# Patient Record
Sex: Male | Born: 1945 | Race: White | Hispanic: No | Marital: Single | Smoking: Former smoker
Health system: Southern US, Community
[De-identification: ages and names within clinical notes are randomized; demographics above are authoritative.]

## PROBLEM LIST (undated history)

## (undated) DIAGNOSIS — E785 Hyperlipidemia, unspecified: Secondary | ICD-10-CM

## (undated) DIAGNOSIS — R0902 Hypoxemia: Secondary | ICD-10-CM

## (undated) DIAGNOSIS — N189 Chronic kidney disease, unspecified: Secondary | ICD-10-CM

## (undated) DIAGNOSIS — I472 Ventricular tachycardia, unspecified: Secondary | ICD-10-CM

## (undated) DIAGNOSIS — I10 Essential (primary) hypertension: Secondary | ICD-10-CM

## (undated) DIAGNOSIS — K219 Gastro-esophageal reflux disease without esophagitis: Secondary | ICD-10-CM

## (undated) DIAGNOSIS — D649 Anemia, unspecified: Secondary | ICD-10-CM

## (undated) DIAGNOSIS — N289 Disorder of kidney and ureter, unspecified: Secondary | ICD-10-CM

## (undated) DIAGNOSIS — I509 Heart failure, unspecified: Secondary | ICD-10-CM

## (undated) DIAGNOSIS — E669 Obesity, unspecified: Secondary | ICD-10-CM

## (undated) DIAGNOSIS — I4821 Permanent atrial fibrillation: Secondary | ICD-10-CM

## (undated) DIAGNOSIS — F1020 Alcohol dependence, uncomplicated: Secondary | ICD-10-CM

## (undated) DIAGNOSIS — T4145XA Adverse effect of unspecified anesthetic, initial encounter: Secondary | ICD-10-CM

## (undated) DIAGNOSIS — T8859XA Other complications of anesthesia, initial encounter: Secondary | ICD-10-CM

## (undated) DIAGNOSIS — M109 Gout, unspecified: Secondary | ICD-10-CM

## (undated) HISTORY — PX: TONSILLECTOMY: SUR1361

## (undated) HISTORY — DX: Hypoxemia: R09.02

## (undated) HISTORY — DX: Obesity, unspecified: E66.9

## (undated) HISTORY — DX: Anemia, unspecified: D64.9

## (undated) HISTORY — DX: Ventricular tachycardia, unspecified: I47.20

## (undated) HISTORY — DX: Heart failure, unspecified: I50.9

## (undated) HISTORY — DX: Essential (primary) hypertension: I10

## (undated) HISTORY — DX: Ventricular tachycardia: I47.2

## (undated) HISTORY — DX: Alcohol dependence, uncomplicated: F10.20

---

## 2002-10-03 ENCOUNTER — Inpatient Hospital Stay (HOSPITAL_COMMUNITY): Admission: EM | Admit: 2002-10-03 | Discharge: 2002-10-09 | Payer: Self-pay | Admitting: Emergency Medicine

## 2002-10-03 ENCOUNTER — Encounter: Payer: Self-pay | Admitting: Emergency Medicine

## 2002-10-04 ENCOUNTER — Encounter (INDEPENDENT_AMBULATORY_CARE_PROVIDER_SITE_OTHER): Payer: Self-pay | Admitting: Interventional Cardiology

## 2005-01-16 ENCOUNTER — Ambulatory Visit: Payer: Self-pay | Admitting: Internal Medicine

## 2005-01-28 ENCOUNTER — Ambulatory Visit: Payer: Self-pay | Admitting: Internal Medicine

## 2005-02-03 ENCOUNTER — Ambulatory Visit: Payer: Self-pay | Admitting: Family Medicine

## 2005-02-04 ENCOUNTER — Ambulatory Visit: Payer: Self-pay | Admitting: *Deleted

## 2005-02-10 ENCOUNTER — Ambulatory Visit: Payer: Self-pay | Admitting: Family Medicine

## 2005-02-17 ENCOUNTER — Ambulatory Visit: Payer: Self-pay | Admitting: Family Medicine

## 2007-02-07 ENCOUNTER — Emergency Department (HOSPITAL_COMMUNITY): Admission: EM | Admit: 2007-02-07 | Discharge: 2007-02-07 | Payer: Self-pay | Admitting: Emergency Medicine

## 2007-08-23 ENCOUNTER — Ambulatory Visit: Payer: Self-pay | Admitting: Cardiology

## 2007-09-13 ENCOUNTER — Ambulatory Visit: Payer: Self-pay

## 2007-09-13 ENCOUNTER — Encounter: Payer: Self-pay | Admitting: Cardiology

## 2007-09-19 ENCOUNTER — Ambulatory Visit: Payer: Self-pay

## 2007-10-18 ENCOUNTER — Ambulatory Visit: Payer: Self-pay | Admitting: Cardiology

## 2007-10-21 ENCOUNTER — Ambulatory Visit: Payer: Self-pay | Admitting: Cardiology

## 2009-10-30 ENCOUNTER — Encounter (INDEPENDENT_AMBULATORY_CARE_PROVIDER_SITE_OTHER): Payer: Self-pay | Admitting: *Deleted

## 2009-10-30 ENCOUNTER — Encounter: Payer: Self-pay | Admitting: Cardiology

## 2009-12-06 DIAGNOSIS — I10 Essential (primary) hypertension: Secondary | ICD-10-CM | POA: Insufficient documentation

## 2009-12-06 DIAGNOSIS — I5032 Chronic diastolic (congestive) heart failure: Secondary | ICD-10-CM

## 2009-12-06 DIAGNOSIS — I4891 Unspecified atrial fibrillation: Secondary | ICD-10-CM

## 2009-12-09 ENCOUNTER — Ambulatory Visit: Payer: Self-pay | Admitting: Cardiology

## 2009-12-09 DIAGNOSIS — E669 Obesity, unspecified: Secondary | ICD-10-CM

## 2010-05-13 ENCOUNTER — Ambulatory Visit: Payer: Self-pay | Admitting: Cardiovascular Disease

## 2010-05-13 ENCOUNTER — Inpatient Hospital Stay (HOSPITAL_COMMUNITY): Admission: EM | Admit: 2010-05-13 | Discharge: 2010-05-30 | Payer: Self-pay | Admitting: Emergency Medicine

## 2010-05-14 ENCOUNTER — Encounter (INDEPENDENT_AMBULATORY_CARE_PROVIDER_SITE_OTHER): Payer: Self-pay | Admitting: Emergency Medicine

## 2010-05-30 ENCOUNTER — Encounter: Payer: Self-pay | Admitting: Cardiology

## 2010-06-02 ENCOUNTER — Telehealth: Payer: Self-pay | Admitting: Cardiology

## 2010-06-03 ENCOUNTER — Encounter: Payer: Self-pay | Admitting: Cardiology

## 2010-06-03 ENCOUNTER — Telehealth: Payer: Self-pay | Admitting: Cardiology

## 2010-06-04 ENCOUNTER — Encounter: Payer: Self-pay | Admitting: Cardiology

## 2010-06-09 ENCOUNTER — Encounter: Payer: Self-pay | Admitting: Cardiology

## 2010-06-10 ENCOUNTER — Encounter: Payer: Self-pay | Admitting: Cardiology

## 2010-06-11 ENCOUNTER — Ambulatory Visit: Payer: Self-pay | Admitting: Cardiology

## 2010-06-13 ENCOUNTER — Telehealth: Payer: Self-pay | Admitting: Cardiology

## 2010-06-18 ENCOUNTER — Telehealth (INDEPENDENT_AMBULATORY_CARE_PROVIDER_SITE_OTHER): Payer: Self-pay | Admitting: Physician Assistant

## 2010-06-23 ENCOUNTER — Telehealth: Payer: Self-pay | Admitting: Cardiology

## 2010-06-27 ENCOUNTER — Encounter: Payer: Self-pay | Admitting: Cardiology

## 2010-07-20 ENCOUNTER — Encounter: Payer: Self-pay | Admitting: Cardiology

## 2010-09-18 ENCOUNTER — Emergency Department (HOSPITAL_COMMUNITY): Admission: EM | Admit: 2010-09-18 | Discharge: 2010-01-20 | Payer: Self-pay | Admitting: Psychology

## 2010-10-30 ENCOUNTER — Encounter: Payer: Self-pay | Admitting: Cardiology

## 2010-10-31 ENCOUNTER — Encounter (INDEPENDENT_AMBULATORY_CARE_PROVIDER_SITE_OTHER): Payer: Self-pay | Admitting: *Deleted

## 2010-11-11 NOTE — Miscellaneous (Signed)
Summary: [facility] Orders   [facility] Orders   Imported By: Roderic Ovens 07/10/2010 12:02:01  _____________________________________________________________________  External Attachment:    Type:   Image     Comment:   External Document

## 2010-11-11 NOTE — Progress Notes (Signed)
Summary: questions re med     Phone Note Call from Patient   Caller: Patient Reason for Call: Talk to Nurse Summary of Call: pt's med list from hospital not matching up with meds he was given -pls call [phone] Initial call taken by: Glynda Jaeger,  June 03, 2010 1:51 PM  Follow-up for Phone Call        left message to call back  Sander Nephew, RN  Additional Follow-up for Phone Call Additional follow up Details #1::        pt returning call-can be reached at [phone] [facility]  June 04, 2010 8:14 AM     Additional Follow-up for Phone Call Additional follow up Details #2::    REVIEWED DISCHARGE MEDS WITH PT AS HE WASNT SURE HE UNDERSTOOD INSTRUCTION BASED ON WHAT HE WAS GIVEN AT THE TIME OF DISCHARGE AND THE MEDICATIONS THAT WERE FILLED AT THE PHARMACY.  AFTER REVIEWING THE  MEDS - PT STATED UNDERSTANDING AND THANKED ME FOR MY TIME. Follow-up by: Charolotte Capuchin, RN,  June 04, 2010 1:18 PM

## 2010-11-11 NOTE — Assessment & Plan Note (Signed)
Summary: rov/hypertention/a-fib/lg  Medications Added LISINOPRIL 40 MG TABS (LISINOPRIL) 1 by mouth two times a day AMLODIPINE BESYLATE 5 MG TABS (AMLODIPINE BESYLATE) 1 by mouth daily CLONIDINE HCL 0.2 MG TABS (CLONIDINE HCL) 1 by mouth two times a day COREG CR 80 MG XR24H-CAP (CARVEDILOL PHOSPHATE) 1 by mouth daily      Allergies Added: NKDA  Visit Type:  Follow-up Primary Provider:  [name], MD  CC:  CHF and Afib.  History of Present Illness: The patient presents for followup of his atrial fibrillation. He had a previous cardiomyopathy which was felt to be nonischemic. A stress test and ultrasound in 2008 and pulse are intact without benign. He has done well since that time. He has been on stable medical regimen. He has Coumadin followed by his primary physician. He has no problems with this. He does not feel palpitations. He has no shortness of breath and has had no PND or orthopnea. He denies any chest pressure, neck or arm discomfort.  Current Medications (verified): 1)  Lisinopril 40 Mg Tabs (Lisinopril) .Marland Kitchen.. 1 By Mouth Two Times A Day 2)  Klor-Con 20 Meq Pack (Potassium Chloride) .Marland Kitchen.. 1 By Mouth Once Daily 3)  Furosemide 40 Mg Tabs (Furosemide) .Marland Kitchen.. 1 By Mouth Once Daily 4)  Coumadin 5 Mg Tabs (Warfarin Sodium) .... As Directed 5)  Amlodipine Besylate 5 Mg Tabs (Amlodipine Besylate) .Marland Kitchen.. 1 By Mouth Daily 6)  Clonidine Hcl 0.2 Mg Tabs (Clonidine Hcl) .Marland Kitchen.. 1 By Mouth Two Times A Day 7)  Coreg Cr 80 Mg Xr24h-Cap (Carvedilol Phosphate) .Marland Kitchen.. 1 By Mouth Daily  Allergies (verified): No Known Drug Allergies  Past History:  Past Medical History: Congestive Heart Failure (EF was 35 - 40% in the past,  in 2008 EF 55%) Alcoholism Atrial Fibrillation Hypertension Obesity  Past Surgical History: Tonsilectomy  Review of Systems       As stated in the HPI and negative for all other systems.   Vital Signs:  Patient profile:   65 year old male Height:      72  inches Weight:      373 pounds BMI:     50.77 Pulse rate:   75 / minute Resp:     16 per minute BP sitting:   118 / 86  (right arm)  Vitals Entered By: Marrion Coy, CNA (December 09, 2009 9:49 AM)  Physical Exam  General:  Well developed, well nourished, in no acute distress. Head:  normocephalic and atraumatic Eyes:  PERRLA/EOM intact; conjunctiva and lids normal. Neck:  Neck supple, no JVD. No masses, thyromegaly or abnormal cervical nodes. Lungs:  Clear bilaterally to auscultation and percussion. Heart:  Non-displaced PMI, chest non-tender; irregular rate and rhythm, S1, S2 without murmurs, rubs or gallops. Carotid upstroke normal, no bruit. Normal abdominal aortic size, no bruits. Femorals normal pulses, no bruits. Pedals normal pulses. Mild bilateral lower extremity edema, no varicosities. Abdomen:  Morbidly obese, no rebound or guarding, unable to appreciate midline pulsatile mass, bruits or organomegaly Msk:  Back normal, normal gait. Muscle strength and tone normal. Pulses:  pulses normal in all 4 extremities Extremities:  No clubbing or cyanosis. Neurologic:  Alert and oriented x 3. Psych:  Normal affect.   EKG  Procedure date:  12/09/2009  Findings:      atrial fibrillation, rate 75, low voltage, left axis, poor anterior R-wave progression.  no change from previous  Impression & Recommendations:  Problem # 1:  ATRIAL FIBRILLATION (ICD-427.31) He has had good rate  control and tolerates Coumadin. No further therapy is indicated. Orders: EKG w/ Interpretation (93000)  Problem # 2:  OBESITY, UNSPECIFIED (ICD-278.00) This is his most significant health problem. I suggest that the [employer].  Problem # 3:  HYPERTENSION (ICD-401.9) His blood pressure is under good control. He was switched to carvedilol immediate release although he prefers the sustained release to improve adherence and also because it is working so well.  Problem # 4:  CHF (ICD-428.0) I have  no reason to suspect worsening heart failure poor ejection fraction. No further testing is indicated.  Patient Instructions: 1)  Your physician recommends that you schedule a follow-up appointment in: 24 months with Dr Antoine Poche 2)  Your physician recommends that you continue on your current medications as directed. Please refer to the Current Medication list given to you today. 3)  You have been diagnosed with atrial fibrillation.  Atrial fibrillation is a condition in which one of the upper chambers of the heart has extra electrical cells causing it to beat very fast.  Please see the handout/brochure given to you today for further information.

## 2010-11-11 NOTE — Progress Notes (Signed)
   Phone Note Other Incoming   Caller: Laurel Dimmer, RN Summary of Call: Pt had a discrepancy in meds at d/c 8-19. He was supposed to be on potassium daily but rec'd no Rx. [facility] [place], left a msg on the patient's home phone to advise him of the Rx. Initial call taken by: Park Breed PA-C,  June 18, 2010 12:29 PM

## 2010-11-11 NOTE — Miscellaneous (Signed)
Summary: Home Health Certification/Care Plan  Home Health Certification/Care Plan   Imported By: Roderic Ovens 07/10/2010 12:04:59  _____________________________________________________________________  External Attachment:    Type:   Image     Comment:   External Document

## 2010-11-11 NOTE — Letter (Signed)
Summary: New Patient letter  [facility]  [address] [place], [place] [address]   Phone: [phone]  Fax: [phone]       10/30/2009 MRN: [id number]  [name] [address] [place], [place]  [address]  Dear Mr. [name],  Welcome to the Gastroenterology Division at [facility].    You are scheduled to see Dr.  Christella Hartigan on 11/26/2009 at 3:30PM on the 3rd floor at [facility], [address].  We ask that you try to arrive at our office 15 minutes prior to your appointment time to allow for check-in.  We would like you to complete the enclosed self-administered evaluation form prior to your visit and bring it with you on the day of your appointment.  We will review it with you.  Also, please bring a complete list of all your medications or, if you prefer, bring the medication bottles and we will list them.  Please bring your insurance card so that we may make a copy of it.  If your insurance requires a referral to see a specialist, please bring your referral form from your primary care physician.  Co-payments are due at the time of your visit and may be paid by cash, check or credit card.     Your office visit will consist of a consult with your physician (includes a physical exam), any laboratory testing he/she may order, scheduling of any necessary diagnostic testing (e.g. x-ray, ultrasound, CT-scan), and scheduling of a procedure (e.g. Endoscopy, Colonoscopy) if required.  Please allow enough time on your schedule to allow for any/all of these possibilities.    If you cannot keep your appointment, please call [phone] to cancel or reschedule prior to your appointment date.  This allows [place] the opportunity to schedule an appointment for another patient in need of care.  If you do not cancel or reschedule by 5 p.m. the business day prior to your appointment date, you will be charged a $50.00 late cancellation/no-show fee.    Thank you for choosing [facility] for your medical needs.  We appreciate the opportunity to care for you.  Please visit [place] at our website  to learn more about our practice.                     Sincerely,                                                             The Gastroenterology Division

## 2010-11-11 NOTE — Assessment & Plan Note (Signed)
Summary: eph  Medications Added LISINOPRIL 5 MG TABS (LISINOPRIL) 1 by mouth daily FUROSEMIDE 40 MG TABS (FUROSEMIDE) 2 by mouth daily CARVEDILOL 6.25 MG TABS (CARVEDILOL) one twice a day KLOR-CON 10 10 MEQ CR-TABS (POTASSIUM CHLORIDE) 1 by mouth daily      Allergies Added: NKDA   Visit Type:  Follow-up Primary Provider:  [name], MD  CC:  Heart Failure.  History of Present Illness: The patient presents for evaluation after recent hospitalization for management of anasarca. He had combined systolic and diastolic heart failure. He required intensive diuresis and physical therapy as he was quite debilitated. Since going home he has had his blood work checked and his BUN and creatinine as well as potassium have been fine. He is tolerating the current regimen of diuresis was continued gradual weight loss. He is not having any new shortness of breath, PND or orthopnea. His massive scrotal and penile edema has resolved. He is working with physical therapy. His oxygen requirements have been creeping down.  Current Medications (verified): 1)  Lisinopril 5 Mg Tabs (Lisinopril) .Marland Kitchen.. 1 By Mouth Daily 2)  Furosemide 40 Mg Tabs (Furosemide) .... 2 By Mouth Daily 3)  Coumadin 5 Mg Tabs (Warfarin Sodium) .... As Directed 4)  Carvedilol 3.125 Mg Tabs (Carvedilol) .Marland Kitchen.. 1 By Mouth Two Times A Day 5)  Klor-Con 10 10 Meq Cr-Tabs (Potassium Chloride) .Marland Kitchen.. 1 By Mouth Daily  Allergies (verified): No Known Drug Allergies  Past History:  Past Medical History:  Nonischemic cardiomyopathy/congestive heart failure, ejection       fraction of 45% per echo in 2011 (previous EF 35-40% in 2003).  Alcoholism Atrial Fibrillation Hypertension Obesity Nonsustained, ventricular tachycardia, asymptomatic Anemia  Hypoxemia CHF  Past Surgical History: Reviewed history from 12/09/2009 and no changes required. Tonsilectomy  Review of Systems       As stated in the HPI and negative for all other  systems.   Vital Signs:  Patient profile:   65 year old male Height:      72 inches Weight:      318 pounds BMI:     43.28 Pulse rate:   82 / minute Resp:     18 per minute BP sitting:   122 / 74  (right arm)  Vitals Entered By: Marrion Coy, CNA (June 11, 2010 11:35 AM)  Physical Exam  General:  Well developed, well nourished, in no acute distress. Head:  normocephalic and atraumatic Neck:  No jugular venous distention at 90, carotids are brisk and symmetric, no bruits, thyromegaly Abdomen:  comment positive sounds normal frequency in pitch, no rebound or guarding, morbidly obese, unable to appreciate other findings as the patient is seated. Pulses:  2+ upper pulses, unable to appreciate dorsalis pedis and posterior tibialis Extremities:  calm moderate bilateral lower extremity edema slightly increased from discharge, severe chronic venous stasis changes Neurologic:  Alert and oriented x 3. Cervical Nodes:  no significant adenopathy Psych:  Normal affect.   EKG  Procedure date:  06/11/2010  Findings:      Atrial fibrillation, rate 82, left axis deviation, possible anteroseptal infarct, QT borderline prolongation, nonspecific T-wave flattening  Impression & Recommendations:  Problem # 1:  CHF (ICD-428.0) We discussed the need to continue to restrict salt and keep his feet elevated. He will continue on his current diuretics.  I will try to titrate his carvedilol to 6.25 mg b.i.d.  Problem # 2:  OBESITY, UNSPECIFIED (ICD-278.00) He understands the need to lose weight with diet and  exercise.  Problem # 3:  ATRIAL FIBRILLATION (ICD-427.31) He tolerates Coumadin and we'll continue with this watch by his primary physician.  Patient Instructions: 1)  Your physician recommends that you schedule a follow-up appointment in: 6  months 2)  Your physician has recommended you make the following change in your medication: Increase Carvedilol to 6.125 mg one twice a  day Prescriptions: CARVEDILOL 6.25 MG TABS (CARVEDILOL) one twice a day  #60 x 11   Entered by:   Charolotte Capuchin, RN   Authorized by:   [name], MD, [facility]   Signed by:   Charolotte Capuchin, RN on 06/11/2010   Method used:   [facility]  [place] [address]       [address] [place] [address]       [place], [place]  [address]       Ph: [phone] or [phone]       Fax: [phone]   RxID:   [id number]  I have reviewed and approved all prescriptions at the time of this visit. [name], MD, [facility]  June 11, 2010 1:38 PM

## 2010-11-11 NOTE — Miscellaneous (Signed)
  Clinical Lists Changes  Observations: Added new observation of CXR RESULTS: There is cardiomegaly with vascular congestion.  Possible   mild interstitial edema.  No confluent opacities or effusions.   Study somewhat limited by motion.    IMPRESSION:   Cardiomegaly with vascular congestion, question mild interstitial   edema.  Study limited by patient motion. (05/16/2010 9:24) Added new observation of ECHOINTERP:  - Left ventricle: Inferior and septal hypokinesis The cavity size     was mildly dilated. Wall thickness was increased in a pattern of     mild LVH. Systolic function was mildly reduced. The estimated     ejection fraction was in the range of 45% to 50%.   - Mitral valve: Mild regurgitation.   - Left atrium: The atrium was mildly dilated.   - Right ventricle: The cavity size was mildly dilated.   - Right atrium: The atrium was mildly dilated.   - Atrial septum: No defect or patent foramen ovale was identified.   - Pulmonary arteries: PA peak pressure: 50mm Hg (S). (05/14/2010 9:23)      Echocardiogram  Procedure date:  05/14/2010  Findings:       - Left ventricle: Inferior and septal hypokinesis The cavity size     was mildly dilated. Wall thickness was increased in a pattern of     mild LVH. Systolic function was mildly reduced. The estimated     ejection fraction was in the range of 45% to 50%.   - Mitral valve: Mild regurgitation.   - Left atrium: The atrium was mildly dilated.   - Right ventricle: The cavity size was mildly dilated.   - Right atrium: The atrium was mildly dilated.   - Atrial septum: No defect or patent foramen ovale was identified.   - Pulmonary arteries: PA peak pressure: 50mm Hg (S).  CXR  Procedure date:  05/16/2010  Findings:      There is cardiomegaly with vascular congestion.  Possible   mild interstitial edema.  No confluent opacities or effusions.   Study somewhat limited by motion.    IMPRESSION:   Cardiomegaly with vascular  congestion, question mild interstitial   edema.  Study limited by patient motion.

## 2010-11-11 NOTE — Letter (Signed)
Summary: [facility] Office Note  [facility] Note   Imported By: Roderic Ovens 12/20/2009 16:29:47  _____________________________________________________________________  External Attachment:    Type:   Image     Comment:   External Document

## 2010-11-11 NOTE — Letter (Signed)
Summary: [facility]  [facility]   Imported By: Marylou Mccoy 07/17/2010 13:39:11  _____________________________________________________________________  External Attachment:    Type:   Image     Comment:   External Document

## 2010-11-11 NOTE — Miscellaneous (Signed)
Summary: [facility] Order Confirmation Form   [facility] Order Confirmation Form   Imported By: Roderic Ovens 06/12/2010 14:29:37  _____________________________________________________________________  External Attachment:    Type:   Image     Comment:   External Document

## 2010-11-11 NOTE — Progress Notes (Signed)
Summary: pt has a medication question take carvedilol 3.125 BID   Phone Note Call from Patient Call back at [facility] Phone [phone]   Caller: Patient Reason for Call: Talk to Nurse, Talk to Doctor Summary of Call: pt was discharged friday and has a Rx for coreg and carvadilol is suppose to take both or not. If not which one dose he need to take. Initial call taken by: Omer Jack,  June 02, 2010 8:54 AM  Follow-up for Phone Call        pt should be on carvedilol 3.125 mg two times a day.  he is aware and states understanding Follow-up by: Charolotte Capuchin, RN,  June 02, 2010 9:47 AM

## 2010-11-11 NOTE — Letter (Signed)
Summary: [facility]  Order Confirmation   [facility]  Order Confirmation   Imported By: Roderic Ovens 07/16/2010 13:45:34  _____________________________________________________________________  External Attachment:    Type:   Image     Comment:   External Document

## 2010-11-11 NOTE — Progress Notes (Signed)
Summary: calling regarding pt oxygen     Phone Note Other Incoming Call back at [phone]   Caller: [facility] Summary of Call: Calling regarding pt oxygen Initial call taken by: Judie Grieve,  June 13, 2010 11:14 AM  Follow-up for Phone Call        LEFT MESSAGE TO CALL BACK  Sander Nephew, RN  on 3 L via n/c  pt wants to if you is to stay on it? and on 3 L?  V.O. ok for pt to wean 02 but keep 02 sat above 92%. per Dr Fayrene Fearing [facility] health nurse aware or orders Follow-up by: Charolotte Capuchin, RN,  June 13, 2010 11:34 AM

## 2010-11-11 NOTE — Miscellaneous (Signed)
Summary: [facility] Orders   [facility] Orders   Imported By: Roderic Ovens 07/23/2010 11:28:15  _____________________________________________________________________  External Attachment:    Type:   Image     Comment:   External Document

## 2010-11-11 NOTE — Progress Notes (Signed)
Summary: Home Health need orders for pt   Phone Note Other Incoming Call back at [phone]   Caller: Beaulah Corin  Summary of Call: [facility] orders for pt to stay on home health Initial call taken by: Judie Grieve,  June 23, 2010 2:08 PM  Follow-up for Phone Call        OT--they needed orders to continue home OT.  Have almost met goal but not there yet.  Gave V/ o to continue.  She will have order faxed to office Dennis Bast, RN, BSN  June 23, 2010 3:06 PM

## 2010-11-13 NOTE — Letter (Signed)
Summary: New Patient letter  [facility]  [address] [place], [place] [address]   Phone: [phone]  Fax: [phone]       10/31/2010 MRN: [id number]  [name] [address] [place], [place]  [address]  Dear [name],  Welcome to the Gastroenterology Division at [facility].    You are scheduled to see Dr.  Russella Dar on 12-08-10 at 3pm on the 3rd floor at [facility], [address].  We ask that you try to arrive at our office 15 minutes prior to your appointment time to allow for check-in.  We would like you to complete the enclosed self-administered evaluation form prior to your visit and bring it with you on the day of your appointment.  We will review it with you.  Also, please bring a complete list of all your medications or, if you prefer, bring the medication bottles and we will list them.  Please bring your insurance card so that we may make a copy of it.  If your insurance requires a referral to see a specialist, please bring your referral form from your primary care physician.  Co-payments are due at the time of your visit and may be paid by cash, check or credit card.     Your office visit will consist of a consult with your physician (includes a physical exam), any laboratory testing he/she may order, scheduling of any necessary diagnostic testing (e.g. x-ray, ultrasound, CT-scan), and scheduling of a procedure (e.g. Endoscopy, Colonoscopy) if required.  Please allow enough time on your schedule to allow for any/all of these possibilities.    If you cannot keep your appointment, please call [phone] to cancel or reschedule prior to your appointment date.  This allows [place] the opportunity to schedule an appointment for another patient in need of care.  If you do not cancel or reschedule by 5 p.m. the business day prior to your appointment date, you will be charged a $50.00 late cancellation/no-show fee.    Thank you for choosing [facility] for your medical needs.  We appreciate the opportunity to care for you.  Please visit [place] at our website  to learn more about our practice.                     Sincerely,                                                             The Gastroenterology Division

## 2010-11-18 ENCOUNTER — Encounter: Payer: Self-pay | Admitting: Cardiology

## 2010-11-18 ENCOUNTER — Telehealth: Payer: Self-pay | Admitting: Cardiology

## 2010-11-26 ENCOUNTER — Telehealth: Payer: Self-pay | Admitting: Cardiology

## 2010-11-27 NOTE — Progress Notes (Signed)
Summary: No longer needs oxygen need order faxed   Phone Note Call from Patient Call back at Home Phone [phone]   Caller: Patient Summary of Call: Pt need something faxed to [facility] that he no longer needs oxygen fax to [phone] Initial call taken by: Judie Grieve,  November 18, 2010 11:27 AM  Follow-up for Phone Call        LVMTCB*  Whitney Maeola Sarah RN  November 18, 2010 12:14 PM  Pt returning call Judie Grieve  November 18, 2010 1:02 PM  Follow-up by: Whitney Maeola Sarah RN,  November 18, 2010 12:14 PM  Additional Follow-up for Phone Call Additional follow up Details #1::        OK to DC 02 Additional Follow-up by: [name], MD, [facility],  November 18, 2010 5:53 PM    Additional Follow-up for Phone Call Additional follow up Details #2::    order faxed  to [facility] Follow-up by: Charolotte Capuchin, RN,  November 18, 2010 6:04 PM

## 2010-11-27 NOTE — Letter (Addendum)
Summary: order to D/C oxygen to [facility], Main Office  [address]   [place], [place] [address]   Phone: [phone]  Fax: [phone]        November 18, 2010    Re:     [name] Address:   [address]     [place], [place]  [address] DOB:     Jun 27, 1946 MRN:     [id number]     Dear [facility],  The above named patient no longer needs oxygen and it may be dis-continued.  If questions, please call [phone].           Sincerely,      Charolotte Capuchin, RN for Dr. [name]

## 2010-12-03 NOTE — Progress Notes (Signed)
Summary: O2 letter    order faxed to [facility] to d/c   Phone Note Call from Patient Call back at [facility] Phone [phone]   Caller: Patient Reason for Call: Talk to Nurse, Talk to Doctor Summary of Call: The company that provides the O2 never got the letter to discontinue it please refax Initial call taken by: Omer Jack,  November 26, 2010 1:25 PM  Follow-up for Phone Call        The company the pt has been using is [employer] not [facility] as Nadeen Landau stated.  The order to d/c will be faxed to them. Follow-up by: Charolotte Capuchin, RN,  November 26, 2010 1:38 PM

## 2010-12-08 ENCOUNTER — Ambulatory Visit: Payer: Self-pay | Admitting: Gastroenterology

## 2010-12-26 LAB — DIFFERENTIAL
Basophils Absolute: 0 10*3/uL (ref 0.0–0.1)
Eosinophils Absolute: 0.3 10*3/uL (ref 0.0–0.7)
Eosinophils Relative: 4 % (ref 0–5)
Lymphocytes Relative: 9 % — ABNORMAL LOW (ref 12–46)
Lymphs Abs: 0.6 10*3/uL — ABNORMAL LOW (ref 0.7–4.0)
Neutrophils Relative %: 76 % (ref 43–77)

## 2010-12-26 LAB — PROTIME-INR
INR: 1.85 — ABNORMAL HIGH (ref 0.00–1.49)
INR: 2.01 — ABNORMAL HIGH (ref 0.00–1.49)
INR: 2.06 — ABNORMAL HIGH (ref 0.00–1.49)
INR: 2.14 — ABNORMAL HIGH (ref 0.00–1.49)
INR: 2.43 — ABNORMAL HIGH (ref 0.00–1.49)
INR: 3.13 — ABNORMAL HIGH (ref 0.00–1.49)
INR: 3.15 — ABNORMAL HIGH (ref 0.00–1.49)
INR: 3.2 — ABNORMAL HIGH (ref 0.00–1.49)
INR: 3.33 — ABNORMAL HIGH (ref 0.00–1.49)
INR: 3.56 — ABNORMAL HIGH (ref 0.00–1.49)
INR: 3.57 — ABNORMAL HIGH (ref 0.00–1.49)
INR: 3.61 — ABNORMAL HIGH (ref 0.00–1.49)
Prothrombin Time: 23.4 s — ABNORMAL HIGH (ref 11.6–15.2)
Prothrombin Time: 24.1 s — ABNORMAL HIGH (ref 11.6–15.2)
Prothrombin Time: 26.5 s — ABNORMAL HIGH (ref 11.6–15.2)
Prothrombin Time: 27.5 seconds — ABNORMAL HIGH (ref 11.6–15.2)
Prothrombin Time: 28.1 seconds — ABNORMAL HIGH (ref 11.6–15.2)
Prothrombin Time: 32.2 s — ABNORMAL HIGH (ref 11.6–15.2)
Prothrombin Time: 32.4 s — ABNORMAL HIGH (ref 11.6–15.2)
Prothrombin Time: 32.8 seconds — ABNORMAL HIGH (ref 11.6–15.2)
Prothrombin Time: 33.8 seconds — ABNORMAL HIGH (ref 11.6–15.2)
Prothrombin Time: 35.3 seconds — ABNORMAL HIGH (ref 11.6–15.2)
Prothrombin Time: 35.7 s — ABNORMAL HIGH (ref 11.6–15.2)
Prothrombin Time: 36 s — ABNORMAL HIGH (ref 11.6–15.2)

## 2010-12-26 LAB — BASIC METABOLIC PANEL WITH GFR
BUN: 26 mg/dL — ABNORMAL HIGH (ref 6–23)
BUN: 33 mg/dL — ABNORMAL HIGH (ref 6–23)
BUN: 38 mg/dL — ABNORMAL HIGH (ref 6–23)
BUN: 46 mg/dL — ABNORMAL HIGH (ref 6–23)
CO2: 35 meq/L — ABNORMAL HIGH (ref 19–32)
CO2: 35 meq/L — ABNORMAL HIGH (ref 19–32)
CO2: 37 meq/L — ABNORMAL HIGH (ref 19–32)
CO2: 39 meq/L — ABNORMAL HIGH (ref 19–32)
Calcium: 8.6 mg/dL (ref 8.4–10.5)
Calcium: 8.7 mg/dL (ref 8.4–10.5)
Calcium: 8.7 mg/dL (ref 8.4–10.5)
Calcium: 8.8 mg/dL (ref 8.4–10.5)
Chloride: 100 meq/L (ref 96–112)
Chloride: 96 meq/L (ref 96–112)
Chloride: 97 meq/L (ref 96–112)
Chloride: 97 meq/L (ref 96–112)
Creatinine, Ser: 1 mg/dL (ref 0.4–1.5)
Creatinine, Ser: 1 mg/dL (ref 0.4–1.5)
Creatinine, Ser: 1.16 mg/dL (ref 0.4–1.5)
Creatinine, Ser: 1.62 mg/dL — ABNORMAL HIGH (ref 0.4–1.5)
GFR calc non Af Amer: 43 mL/min — ABNORMAL LOW
GFR calc non Af Amer: >60 mL/min
GFR calc non Af Amer: >60 mL/min
GFR calc non Af Amer: >60 mL/min
Glucose, Bld: 109 mg/dL — ABNORMAL HIGH (ref 70–99)
Glucose, Bld: 116 mg/dL — ABNORMAL HIGH (ref 70–99)
Glucose, Bld: 120 mg/dL — ABNORMAL HIGH (ref 70–99)
Glucose, Bld: 135 mg/dL — ABNORMAL HIGH (ref 70–99)
Potassium: 3.9 meq/L (ref 3.5–5.1)
Potassium: 4.4 meq/L (ref 3.5–5.1)
Potassium: 4.6 meq/L (ref 3.5–5.1)
Potassium: 4.7 meq/L (ref 3.5–5.1)
Sodium: 140 meq/L (ref 135–145)
Sodium: 141 meq/L (ref 135–145)
Sodium: 141 meq/L (ref 135–145)
Sodium: 144 meq/L (ref 135–145)

## 2010-12-26 LAB — BASIC METABOLIC PANEL
BUN: 14 mg/dL (ref 6–23)
BUN: 15 mg/dL (ref 6–23)
BUN: 19 mg/dL (ref 6–23)
BUN: 20 mg/dL (ref 6–23)
BUN: 23 mg/dL (ref 6–23)
BUN: 28 mg/dL — ABNORMAL HIGH (ref 6–23)
CO2: 32 mEq/L (ref 19–32)
CO2: 34 mEq/L — ABNORMAL HIGH (ref 19–32)
CO2: 36 mEq/L — ABNORMAL HIGH (ref 19–32)
CO2: 36 mEq/L — ABNORMAL HIGH (ref 19–32)
CO2: 38 mEq/L — ABNORMAL HIGH (ref 19–32)
Calcium: 8.4 mg/dL (ref 8.4–10.5)
Calcium: 8.6 mg/dL (ref 8.4–10.5)
Calcium: 8.7 mg/dL (ref 8.4–10.5)
Calcium: 9.1 mg/dL (ref 8.4–10.5)
Calcium: 9.1 mg/dL (ref 8.4–10.5)
Calcium: 9.1 mg/dL (ref 8.4–10.5)
Calcium: 9.4 mg/dL (ref 8.4–10.5)
Chloride: 102 mEq/L (ref 96–112)
Chloride: 102 mEq/L (ref 96–112)
Chloride: 104 mEq/L (ref 96–112)
Chloride: 105 mEq/L (ref 96–112)
Creatinine, Ser: 0.88 mg/dL (ref 0.4–1.5)
Creatinine, Ser: 0.88 mg/dL (ref 0.4–1.5)
Creatinine, Ser: 0.93 mg/dL (ref 0.4–1.5)
Creatinine, Ser: 0.95 mg/dL (ref 0.4–1.5)
Creatinine, Ser: 1.05 mg/dL (ref 0.4–1.5)
GFR calc Af Amer: >60 mL/min (ref >60)
GFR calc Af Amer: >60 mL/min (ref >60)
GFR calc Af Amer: >60 mL/min (ref >60)
GFR calc Af Amer: >60 mL/min (ref >60)
GFR calc Af Amer: >60 mL/min (ref >60)
GFR calc Af Amer: >60 mL/min (ref >60)
GFR calc non Af Amer: 46 mL/min — ABNORMAL LOW (ref >60)
GFR calc non Af Amer: >60 mL/min (ref >60)
GFR calc non Af Amer: >60 mL/min (ref >60)
GFR calc non Af Amer: >60 mL/min (ref >60)
GFR calc non Af Amer: >60 mL/min (ref >60)
GFR calc non Af Amer: >60 mL/min (ref >60)
GFR calc non Af Amer: >60 mL/min (ref >60)
Glucose, Bld: 102 mg/dL — ABNORMAL HIGH (ref 70–99)
Glucose, Bld: 103 mg/dL — ABNORMAL HIGH (ref 70–99)
Glucose, Bld: 109 mg/dL — ABNORMAL HIGH (ref 70–99)
Glucose, Bld: 118 mg/dL — ABNORMAL HIGH (ref 70–99)
Glucose, Bld: 122 mg/dL — ABNORMAL HIGH (ref 70–99)
Glucose, Bld: 122 mg/dL — ABNORMAL HIGH (ref 70–99)
Glucose, Bld: 98 mg/dL (ref 70–99)
Potassium: 3.8 mEq/L (ref 3.5–5.1)
Potassium: 3.9 mEq/L (ref 3.5–5.1)
Potassium: 4 mEq/L (ref 3.5–5.1)
Potassium: 4.3 mEq/L (ref 3.5–5.1)
Potassium: 4.3 mEq/L (ref 3.5–5.1)
Potassium: 4.9 mEq/L (ref 3.5–5.1)
Potassium: 5 mEq/L (ref 3.5–5.1)
Sodium: 138 mEq/L (ref 135–145)
Sodium: 140 mEq/L (ref 135–145)
Sodium: 141 mEq/L (ref 135–145)
Sodium: 141 mEq/L (ref 135–145)
Sodium: 142 mEq/L (ref 135–145)
Sodium: 142 mEq/L (ref 135–145)
Sodium: 142 mEq/L (ref 135–145)
Sodium: 143 mEq/L (ref 135–145)
Sodium: 143 mEq/L (ref 135–145)

## 2010-12-26 LAB — COMPREHENSIVE METABOLIC PANEL
BUN: 29 mg/dL — ABNORMAL HIGH (ref 6–23)
CO2: 25 mEq/L (ref 19–32)
Calcium: 8.7 mg/dL (ref 8.4–10.5)
Chloride: 108 mEq/L (ref 96–112)
Creatinine, Ser: 1.29 mg/dL (ref 0.4–1.5)
GFR calc non Af Amer: 56 mL/min — ABNORMAL LOW (ref >60)
Glucose, Bld: 130 mg/dL — ABNORMAL HIGH (ref 70–99)
Total Bilirubin: 1.3 mg/dL — ABNORMAL HIGH (ref 0.3–1.2)

## 2010-12-26 LAB — CBC
HCT: 31.7 % — ABNORMAL LOW (ref 39.0–52.0)
HCT: 33.3 % — ABNORMAL LOW (ref 39.0–52.0)
HCT: 33.5 % — ABNORMAL LOW (ref 39.0–52.0)
HCT: 33.6 % — ABNORMAL LOW (ref 39.0–52.0)
Hemoglobin: 10 g/dL — ABNORMAL LOW (ref 13.0–17.0)
Hemoglobin: 10.3 g/dL — ABNORMAL LOW (ref 13.0–17.0)
Hemoglobin: 10.6 g/dL — ABNORMAL LOW (ref 13.0–17.0)
Hemoglobin: 9.8 g/dL — ABNORMAL LOW (ref 13.0–17.0)
MCH: 28.8 pg (ref 26.0–34.0)
MCH: 29.3 pg (ref 26.0–34.0)
MCH: 29.4 pg (ref 26.0–34.0)
MCHC: 30 g/dL (ref 30.0–36.0)
MCHC: 30.7 g/dL (ref 30.0–36.0)
MCHC: 30.9 g/dL (ref 30.0–36.0)
MCHC: 31.5 g/dL (ref 30.0–36.0)
MCV: 93.1 fL (ref 78.0–100.0)
MCV: 94.6 fL (ref 78.0–100.0)
MCV: 95.7 fL (ref 78.0–100.0)
MCV: 96 fL (ref 78.0–100.0)
MCV: 96.1 fL (ref 78.0–100.0)
Platelets: 220 10*3/uL (ref 150–400)
Platelets: 232 10*3/uL (ref 150–400)
Platelets: 301 10*3/uL (ref 150–400)
RBC: 3.35 MIL/uL — ABNORMAL LOW (ref 4.22–5.81)
RBC: 3.47 MIL/uL — ABNORMAL LOW (ref 4.22–5.81)
RBC: 3.61 MIL/uL — ABNORMAL LOW (ref 4.22–5.81)
RDW: 17.4 % — ABNORMAL HIGH (ref 11.5–15.5)
RDW: 17.7 % — ABNORMAL HIGH (ref 11.5–15.5)
RDW: 17.8 % — ABNORMAL HIGH (ref 11.5–15.5)
WBC: 5.8 10*3/uL (ref 4.0–10.5)
WBC: 6.4 10*3/uL (ref 4.0–10.5)
WBC: 7.4 10*3/uL (ref 4.0–10.5)
WBC: 7.9 10*3/uL (ref 4.0–10.5)

## 2010-12-26 LAB — GLUCOSE, CAPILLARY
Comment 1: 0
Glucose-Capillary: 113 mg/dL — ABNORMAL HIGH (ref 70–99)
Glucose-Capillary: 136 mg/dL — ABNORMAL HIGH (ref 70–99)

## 2010-12-26 LAB — CK TOTAL AND CKMB (NOT AT ARMC)
CK, MB: 1.1 ng/mL (ref 0.3–4.0)
Relative Index: INVALID (ref 0.0–2.5)

## 2010-12-26 LAB — HEMOGLOBIN A1C
Hgb A1c MFr Bld: 6.4 % — ABNORMAL HIGH (ref <5.7)
Mean Plasma Glucose: 137 mg/dL — ABNORMAL HIGH (ref <117)

## 2010-12-26 LAB — CARDIAC PANEL(CRET KIN+CKTOT+MB+TROPI)
CK, MB: 1.2 ng/mL (ref 0.3–4.0)
Relative Index: INVALID (ref 0.0–2.5)
Total CK: 46 U/L (ref 7–232)
Total CK: 46 U/L (ref 7–232)
Troponin I: 0.02 ng/mL (ref 0.00–0.06)

## 2010-12-26 LAB — WOUND CULTURE

## 2010-12-26 LAB — BRAIN NATRIURETIC PEPTIDE
Pro B Natriuretic peptide (BNP): 367 pg/mL — ABNORMAL HIGH (ref 0.0–100.0)
Pro B Natriuretic peptide (BNP): 838 pg/mL — ABNORMAL HIGH (ref 0.0–100.0)
Pro B Natriuretic peptide (BNP): 952 pg/mL — ABNORMAL HIGH (ref 0.0–100.0)

## 2010-12-26 LAB — POCT CARDIAC MARKERS
CKMB, poc: <1 ng/mL — ABNORMAL LOW (ref 1.0–8.0)
Myoglobin, poc: 71.6 ng/mL (ref 12–200)

## 2011-02-12 ENCOUNTER — Ambulatory Visit: Payer: Self-pay | Admitting: Cardiology

## 2011-02-12 ENCOUNTER — Other Ambulatory Visit: Payer: Self-pay | Admitting: *Deleted

## 2011-02-12 MED ORDER — CARVEDILOL 6.25 MG PO TABS: 6.2500 mg | ORAL_TABLET | Freq: Two times a day (BID) | ORAL | Status: DC

## 2011-02-12 MED ORDER — POTASSIUM CHLORIDE 10 MEQ PO TBCR: 10.0000 meq | EXTENDED_RELEASE_TABLET | Freq: Every day | ORAL | Status: DC

## 2011-02-24 NOTE — Assessment & Plan Note (Signed)
[facility]                            CARDIOLOGY OFFICE NOTE   [name], Richard                        MRN:          [id number]  DATE:10/18/2007                            DOB:          1946/02/05    PRIMARY CARE PHYSICIAN:  Kristian Covey PA at the [facility].   REASON FOR PRESENTATION:  Evaluate patient with congestive heart  failure.   HISTORY OF PRESENT ILLNESS:  This is the second office visit for this  pleasant 65 year old gentleman.  He had a history of congestive heart  failure with a reduced ejection fraction in 2003 of 35-45%.  He had no  ischemia workup.  He came to see me and he was doing relatively well.  He did have an exercise perfusion study which demonstrated no evidence  of ischemia or infarct.  The EF was said to be 47%.  An echocardiogram  demonstrated the EF to be 50-55% with no significant valvular  abnormalities.   He returns today.  He says he has been feeling well.  He is not  describing any new shortness of breath.  He has no PND or orthopnea.  He  has not been having any chest discomfort, neck or arm discomfort.  He  has had no palpitations, presyncope or syncope.   Of note, he is in permanent atrial fibrillation.  He is on Coumadin for  this.   PAST MEDICAL HISTORY:  1. Cardiomyopathy (had been 35-45% in 2003, but appears to be 50-55%      currently).  2. Atrial fibrillation permanently.  3. Hypertension x 6 years.  4. Previous ETOH use.  5. Morbid obesity.   ALLERGIES:  [name].   MEDICATIONS:  1. Lisinopril 40 mg daily.  2. Klor-Con 20 mEq daily.  3. Furosemide 40 mg daily.  4. Coumadin.  5. Coreg 80 mg daily.   REVIEW OF SYSTEMS:  As stated in the HPI and otherwise negative for  other systems.   PHYSICAL EXAMINATION:  GENERAL:  The patient is in no distress.  VITAL SIGNS:  Blood pressure 136/92, heart rate 88 and regular, weight  366 pounds, body mass index 49.  HEENT:  Eyes are unremarkable.   Pupils are equal, round, and reactive to  light.  Fundi not visualized.  Oral mucosa unremarkable.  NECK:  No jugular venous distention at 45 degrees.  Carotid upstroke  brisk and symmetric.  No bruits.  No thyromegaly.  LYMPHATICS:  No cervical, axillary or inguinal adenopathy.  LUNGS:  Clear to auscultation bilaterally.  BACK:  No costovertebral angle tenderness.  CHEST:  Unremarkable.  HEART:  PMI not displaced or sustained.  S1 and S2 within normal limits.  No S3.  No S4, clicks, rubs, murmurs.  ABDOMEN:  Morbidly obese.  Positive bowel sounds.  Normal frequency and  pitch.  No bruits.  No rebound.  No guarding.  Midline pulses.  No mass,  hepatosplenomegaly, splenomegaly.  SKIN:  No rashes.  EXTREMITIES:  Pulses are 2+.  No edema.   ASSESSMENT/PLAN:  1. Cardiomyopathy.  The patient may have a reduced systolic ejection  fraction in the past, but it now is improved.  He has a low normal      systolic ejection fraction.  He probably does have some diastolic      dysfunction that is managed with diuretics, salt restriction and      blood pressure control.  I will make no change to his medical      regimen.  2. Obesity.  He understands the need to lose weight with diet and      exercise.  3. Hypertension.  His blood pressure is well controlled.  He will      continue the medications as listed.  4. Atrial fibrillation.  He is tolerating Coumadin.  I am going to put      a 24 hour Holter monitor to make sure that he has adequate rate      control.  Further medication changes will be based on this.   FOLLOW UP:  I will see him back in 1 year and then perhaps as needed  thereafter.     [name], MD, [facility]  Electronically Signed    JH/MedQ  DD: 10/18/2007  DT: 10/18/2007  Job #: [id number]   cc:   Kristian Covey, PA

## 2011-02-24 NOTE — Assessment & Plan Note (Signed)
[facility]                            CARDIOLOGY OFFICE NOTE   [name], [name]                        MRN:          [id number]  DATE:08/23/2007                            DOB:          [birth year]    REFERRED BY:  Kristian Covey, PA-C at the [facility].   REASON FOR PRESENTATION:  The patient has cardiomyopathy.   HISTORY OF PRESENT ILLNESS:  The patient is a 65 year old gentleman with  a history of cardiomyopathy diagnosed in 2004.  He was seen by another  cardiology group at that time but stopped seeing them due to a lack of  insurance.  I do see from his hospital notes in December 2003 actually  that he had dyspnea.  He was noted to have atrial flutter.  His  echocardiogram demonstrated right ventricular enlargement which they  felt was secondary to left ventricular failure.  The etiology was felt  to be possibly alcohol, possibly sleep apnea related to his obesity.  They did not rule out coronary obstruction.  There was not a stress test  or catheterization at that time.  The patient was managed medically.  He  was followed for a short time as an outpatient but has not seen a  cardiologist in some time.  He was followed very closely and carefully  by Dr. Collins Scotland.  He was referred today to establish with a cardiologist  given this diagnosis.  He actually reports that he gets along fairly  well.  He says he walks 100 yards to his mailbox routinely.  There is a  slight incline here.  He says he does not get any profound dyspnea with  this.  He will get short of breath if he does this a couple of times.  He certainly does not describe any resting shortness of breath.  Denies  any PND or orthopnea.  He denies any chest discomfort, neck or arm  discomfort.  He has had no palpitations, presyncope or syncope.  He says  he does not have excessive daytime somnolence.  He says he does not  snore.  The only person who would be there to hear him is his  elderly  father who might be asleep.   PAST MEDICAL HISTORY:  1. Cardiomyopathy (EF 35-45% in December 2003.  The study was not able      to visualize regional wall motion abnormalities.  There were no      significant valvular abnormalities other than mild to moderate      tricuspid regurgitation.  He was estimated to have some mild      elevated systolic pressures in his right ventricle).  2. Atrial fibrillation persistently with chronic Coumadin therapy.  3. Hypertension for six years.  4. Previous EtOH use.  5. Morbid obesity.   PAST SURGICAL HISTORY:  None.   MEDICATIONS:  1. Lisinopril 40 mg daily.  2. Klor-Con 20 mEq daily.  3. Furosemide 40 mg daily.  4. Coumadin 5 mg as directed.  5. Coreg 80 mg daily.   SOCIAL HISTORY:  The patient has never been married.  He has no  children.  He lives with his father.  He is currently disabled.  He quit  smoking in 1985 after two packs per day for 10 years.  He still drinks  beer.   FAMILY HISTORY:  Noncontributory for early coronary artery disease,  cardiomyopathy or congestive heart failure.   REVIEW OF SYSTEMS:  As stated in the HPI and positive for reflux, mild  lower extremity swelling, rhinitis, negative for other systems.   PHYSICAL EXAMINATION:  VITAL SIGNS:  The patient is in no distress.  Blood pressure 178/106.  Heart rate 93 and regular.  Weight 369 pounds,  body mass index 49.  HEENT:  Eyes unremarkable.  Pupils are equal, round and reactive to  light.  Fundi not visualized.  Oral mucosa unremarkable.  NECK:  No jugular venous distension at 45 degrees.  Carotid upstroke  brisk and symmetric.  No bruits.  No thyromegaly.  LYMPHATICS:  Compromised by his size but no obvious cervical, axillary  or inguinal adenopathy.  LUNGS:  Clear to auscultation bilaterally.  BACK:  No costovertebral angle tenderness.  CHEST:  Unremarkable.  HEART:  PMI not displaced or sustained.  S1 and S2 within normal limits.  No S3.  No S4,  clicks, rubs, murmurs.  ABDOMEN:  Morbidly obese.  Positive bowel sounds.  Normal in frequency  and pitch.  No bruits, rebound, guarding. No midline pulsatile mass.  No  hepatomegaly.  No splenomegaly.  SKIN:  No rash or nodules.  EXTREMITIES:  2+ pulses throughout.  Mild bilaterally lower extremity  edema.  NEUROLOGIC:  Oriented to person, place and time.  Cranial nerves II-XII  grossly intact.  Motor grossly intact.   EKG:  Atrial fibrillation, rate 91, right axis deviation, poor anterior  R wave progression.  Nonspecific T wave changes.  Cannot exclude old  anterior infarct.   ASSESSMENT/PLAN:  Problem #1.  Cardiomyopathy.  The patient has a  cardiomyopathy and has not had his ejection fraction apparently measured  in quite awhile.  I will try to get an echocardiogram though I suspect  he will have some poor images.  He needs to have ischemic cardiomyopathy  excluded. In this situation, given his Coumadin and his size, I am  actually going to proceed with his stress perfusion study to see if we  can get reasonable images to rule out obstructive coronary artery  disease.  He does not have any systems consistent with angina.  Therefore, I think this is a reasonable choice rather than  catheterization.  He seems to be on a reasonable medication regimen.  His blood pressure is elevated as addressed below.  We discussed the  likely diagnosis and will continue to review this with extensive  education.   Problem #2.  Hypertension.  Blood pressure is elevated here.  However,  he says that he is very anxious coming here.  He says it is always under  excellent control at Dr. Alda Berthold office.  I will try to call the office  in the next few days to see if this is correct.  We could titrate his  medications for heart failure and blood pressure control if this is not  the case.   Problem #3.  Morbid obesity.  We discussed the need to lose weight with  diet and exercise.   Problem #4.   Atrial fibrillation.  He seems to have reasonable rate  control and he is maintaining Coumadin which is indicated.   Problem #5.  Alcohol.  I am not sure how much he drinks.  It was never  clear that this might not be an etiology.  Abstinence is recommended.   FOLLOW UP:  I would like to see him in a couple of months since I am  just getting to know his situation.     [name], MD, [facility]  Electronically Signed    JH/MedQ  DD: 08/23/2007  DT: 08/24/2007  Job #: [id number]   cc:   Kristian Covey, PA-C  Tammy R. Collins Scotland, M.D.

## 2011-02-27 NOTE — Discharge Summary (Signed)
[name], GETER NO.:  [id number]   MEDICAL RECORD NO.:  [id number]                   PATIENT TYPE:  INP   LOCATION:  0355                                 FACILITY:  [facility]   PHYSICIAN:  Darius Bump, M.D.             DATE OF BIRTH:  [birth year]   DATE OF ADMISSION:  10/03/2002  DATE OF DISCHARGE:  10/09/2002                                 DISCHARGE SUMMARY   ADMISSION DIAGNOSES:  1. Volume overload/congestive heart failure of unclear etiology.  2. Anemia.  3. Possible obstructive sleep apnea.   DISCHARGE DIAGNOSES:  1. Congestive heart failure compensated.  2. New onset atrial fibrillation on anticoagulation.  3. 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   BRIEF HISTORY:  [name] is a 65 year old gentleman admitted on October 03, 2002 with a three week history of progressively worsening lower  extremity edema, shortness of breath, orthopnea, easy fatigability and PND  without chest pain or palpitations.   PAST MEDICAL HISTORY:  Remarkable for no history of heart disease, renal  disease, liver disease or thyroid illness. He was on no medications at the  time of admission.   PHYSICAL EXAMINATION:  VITAL SIGNS:  Blood pressure 176/96, heart rate 114,  respiratory rate 26, O2 sat 95% on 2 liters and afebrile.  GENERAL:  He is a morbidly obese gentleman in mild respiratory distress.  HEENT:  Unremarkable.  NECK:  Without JVD.  LUNGS:  Vesicular but reduced at the bases.  HEART:  Tachypneic without gallop or murmur heard.  ABDOMEN:  Obese without edema. Bowel sounds were present.  EXTREMITIES:  With 4+ pitting edema to the knees bilaterally.   ADMISSION LABS:  Remarkable for a white count of 6, hemoglobin of 12.3,  platelet count of 281.   Chest x-ray showed cardiomegaly with congestion.   HOSPITAL COURSE:  1. CONGESTIVE HEART FAILURE.  Etiology of his CHF was unclear. He ruled out     for an MI  by CPK's. Echocardiogram was technically difficult, did show a     mildly dilated LV with and EF between 35 and 45%, aortic valve thickness     was mildly increased. He appeared to have mild to moderately increased     pulmonary pressures. The right atrium was dilated. By hospital day three,     there appeared to be some atrial flutter and a four beat run of V-TACH     on telemetry. A cardiology consultation was obtained. The patient was     seen by Dr. Katrinka Blazing. The patient had beta blocker therapy and chronic     Coumadin were recommended along with low dose of amiodarone in     anticipation of cardioversion in 4-6 weeks. The patient diuresed     throughout hospitalization and was considerably more comfortable at the     time of discharge.  2. POSSIBILITY  OF OBSTRUCTIVE SLEEP APNEA.  It is felt that with elevated     pulmonary pressure that the patient likely had cor pulmonale due to OSA.     He did desaturate though on pulse oximetry in the hospital and a sleep     study had to be set up for him upon discharge.   DISCHARGE MEDICATIONS:  1. Coumadin 10 mg 1/2 tablet today on October 10, 2002 with a followup PT     on October 11, 2002.  2. Lasix 40 mg a day.  3. Iron sulfate 325 mg daily.  4. K-Dur 20 mEq daily.  5. Coreg 6.25 mg twice daily.  6. Altace 5 mg daily.  7. Amiodarone per cardiology.   CONSULTATIONS:  Dr. Verdis Prime of cardiology.   PROCEDURE:  Echocardiogram on October 04, 2002 with findings as above.   FOLLOW UP:  The patient is to follow up on __________ Cardiology on October 11, 2002 for a protime. He is to follow up with cardiology per the  instructions and he was recommended to see Dr. Leonides Sake, with whom he  had made an appointment to establish care prior to admission with followup  in the next two weeks.                                               Darius Bump, M.D.    MJM/MEDQ  D:  11/24/2002  T:  11/24/2002  Job:  [id number]   cc:   Lyn Records III, M.D.  [address]  [address]  [place]  [place] [address]  Fax: [phone]   Holley Bouche, M.D.  [address]  [place], [place] [address]  Fax: [phone]

## 2011-02-27 NOTE — Consult Note (Signed)
[name], [name] NO.:  [id number]   MEDICAL RECORD NO.:  [id number]                   PATIENT TYPE:  INP   LOCATION:  0355                                 FACILITY:  [facility]   PHYSICIAN:  Lesleigh Noe, M.D.            DATE OF BIRTH:  Mar 26, 1946   DATE OF CONSULTATION:  10/06/2002  DATE OF DISCHARGE:                                   CONSULTATION   REASON FOR CONSULTATION:  Shortness of breath.   CONCLUSIONS:  1. Dyspnea secondary to congestive heart failure due to mild to moderate     left ventricular dysfunction. Etiology of the dysfunction uncertain. Rule     out ETOH related. Rule out obesity related heart disease, rule out     coronary artery disease, rule out contribution from atrial arrhythmia (#2     below).  2. Atrial flutter with rapid ventricular response possibly ethanol related.  3. Echo evidence of right ventricular enlargement secondary to LV failure     and also possible component of obesity related heart disease/sleep apnea     syndrome.   RECOMMENDATIONS:  1. Diuresis as you have done.  2. Add ACE inhibitor therapy because of LV systolic dysfunction.  3. Add low dose beta blocker therapy for rate control of atrial flutter and     also because of LV dysfunction.  4. Chronic Coumadin therapy.  5. Consider initiating amiodarone therapy with potential plan for electrical     cardioversion 4-6 weeks.  6. Ischemic evaluation would also be considered especially if there is     persistent recurrent heart failure on medical therapy.   COMMENTS:  The patient is 16 years of old and prior to admission on October 03, 2002 had a two week history of lower extremity swelling and dyspnea on  exertion. In the 3-4 days prior to admission, he also had orthopnea and PND.  Both lower extremities swollen but the left lower extremity swelling was  greater than the right. There was no chest discomfort, no tachycardia  palpitations, no  syncope. He denied hemoptysis. No chills or fever.  No  phlegm production.   MEDICATIONS ON ADMISSION:  None.   HABITS:  He used to be a heavy drinker, now drinks 2-3 beers every day or  every other day. Denies smoking. Discontinued smoking 30 years ago.   SIGNIFICANT MEDICAL PR0BLEMS:  1. Obesity.  2. Prior history of heavy ethanol consumption.  3. Discontinued smoking 20 years ago.   ALLERGIES:  None known.   PHYSICAL EXAMINATION:  VITAL SIGNS:  Heart rate is 80, blood pressure  130/80, respirations 16 and nonlabored. The patient is afebrile. Weight is  290 pounds.  GENERAL:  He is markedly obese. It is difficult to tell where  is neck veins are.  LUNGS:  Clear.  CARDIAC:  Reveals an irregular rhythm.  There is 2+ bilateral lower extremity edema. ABDOMEN:  Soft. NEUROLOGIC:  Unremarkable.   LABORATORY DATA:  BUN and creatinine 18 and 1.0. Potassium 3.9, hemoglobin  12.0, CK-MB and troponin negative x3. BMP was 200 on admission, TSH 2.67.   Chest x-ray, cardiomegaly with mild interstitial edema on admission October 03, 2002.  EKG on admission revealed atrial fib flutter with a ventricular  response at approximately 110 beats per minute. Other than one strip  apparently from the emergency room, the patient has been in atrial flutter  since admission to the hospital and I have some doubt that the monitor  strips from the emergency room is actually this patient's rhythm strip.  Since admission to the hospital, the patient has diuresed approximately 10  liters. His weight has decreased significantly.   DISCUSSION:  This patient presented with gradually developing left and right  heart failure. It is potentially possible that the precipitating event was  the development of atrial dysrhythmia. He is certainly obese and has some  degree of right heart failure based upon the echo which demonstrates the  enlargement. There is LV enlargement and hypocontractility noted as well.   Certainly the left ventricular abnormalities could be related to prior heavy  ethanol, obesity, or possibly even superimposed coronary artery disease. For  the time being, I think medical management with diuresis, the addition of  ACE inhibitor and beta blocker therapy and chronic Coumadin therapy are the  treatments of choice, low dose amiodarone therapy would also probably be  started in anticipation of potential cardioversion in 4-6 weeks.                                               Lesleigh Noe, M.D.    HWS/MEDQ  D:  10/06/2002  T:  10/06/2002  Job:  [id number]   cc:   Darius Bump, M.D.  [address]  [place] [address]  Fax: [phone]   Theressa Millard, M.D.  [address] [place]  [place] [address]  Fax: [phone]   Jackie Plum, M.D.  [address]  [place]  [place] [address]  Fax: [phone]

## 2011-02-27 NOTE — H&P (Signed)
NAME:  [name], HAEGELE NO.:  [id number]   MEDICAL RECORD NO.:  [id number]                   PATIENT TYPE:  EMS   LOCATION:  ED                                   FACILITY:  [facility]   PHYSICIAN:  Jackie Plum, M.D.             DATE OF BIRTH:  20-Jan-1946   DATE OF ADMISSION:  10/03/2002  DATE OF DISCHARGE:                                HISTORY & PHYSICAL   PROBLEM LIST:  1. Fluid overload, likely secondary to congestive heart failure.  2. Morbid obesity with possible obstructive sleep apnea.  3. Normocytic anemia, likely secondary to fluid overload.   CHIEF COMPLAINT:  Shortness of breath.   HISTORY OF PRESENT ILLNESS:  The patient is a 65 year old gentleman without  any prior heart disease, hypertension, diabetes mellitus, renal disease, or  liver illness, who presents with a three week history of progressively  worsening shortness of breath with bilateral lower extremity edema,  orthopnea, easy fatigability, cough productive of grayish sputum. No history  of fever or chills but admits to occasional paroxysmal nocturnal dyspnea  without any chest pain. In the emergency room the patient was given 80 mg of  Lasix with some elevation of his respiratory symptomatology.   PAST MEDICAL HISTORY:  The patient denies any previous history of heart  disease, renal disease, liver disease, or thyroid illness. He denies any  history of hypertension. He has not seen a doctor for more than 10 years. No  history of cancer in the patient's family.   FAMILY HISTORY:  Positive for congestive heart failure in mother and  hypertension in father.   ALLERGIES:  No known drug allergies.   MEDICATIONS:  The patient is not on any regular medications.   SOCIAL HISTORY:  He is single. 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 He does not drink alcohol but does smoke about 25  years ago. After 20 years, he stopped smoking. He denies illicit drug  use.   REVIEW OF SYSTEMS:  No history of hematemesis, hemoptysis, dysuria,  frequency of urination, dizziness, melena, diarrhea, constipation, abdominal  pain, or vomiting.   PHYSICAL EXAMINATION:  VITAL SIGNS: Blood pressure 176/96, pulse 114,  respiratory rate 26 per minute, O2 sat of 95% on 2 liters of oxygen.  Temperature 98.6.  GENERAL: Morbidly obese Caucasian gentleman lying on a stretcher with his  brother at his bedside, in mild respiratory distress.  HEENT: Normocephalic, atraumatic. Head, pupils are equal, round, and  reactive to light and accommodation. Extraocular muscles intact. Oropharynx  moist without any erythema. TM's within normal limits.  NECK: Absence of any raised jugular venous distention. No thyromegaly.  Supple.  LUNGS: Breath sounds were vesicular but reduced at the bases. There was no  obvious wheezes. Perhaps some mild crackles bilaterally at the bases.  HEART: The patient was tachycardiac. There was no gallops or murmurs  appreciated on  cardiac examination.  ABDOMEN: Very obese. No discernible edema on palpation. Bowel sounds were  present. They were normal active. No organomegaly was appreciated. Nontender  on examination.  EXTREMITIES: The patient had about 4+ pitting edema up to the knees  bilaterally. Dorsalis pedis pulses were present but reduced about 1+ due to  the extensive edema. The patient was not cyanotic on examination.  CNS: The patient was alert and oriented times three. No focal deficits  appreciated.   LABORATORY DATA:  WBC count of 6.1, hemoglobin and hematocrit 12.3 and 36.6.  Platelet count 281. MCV 91.4. C-met pending.   DIAGNOSTIC IMPRESSION:  Chest x-ray showed cardiomegaly with congestion. EKG  12 lead s pending.   ASSESSMENT:  The patient is a 65 year old gentleman presenting with three  week history of progressively worsening shortness of breath with bilateral  lower extremity edema, orthopnea, and easy fatigability without  any chest  pain or palpitations. He has cough productive of grey sputum. The patient's  respiratory symptoms improved with Lasix. X-ray notable for cardiomegaly  with congestion.   IMPRESSION:  My impression is congestive heart failure. The patient is  morbidly obese and brother tells me at the bedside that the patient has some  problems with snoring and therefore, question of obstructive sleep apnea  with secondary __ may be entertained. The patient does not have any prior  history of hypertension, diabetes mellitus, or heart disease. He does not  know his cholesterol level.   PLAN:  Admit the patient. Obtain 2-D echocardiogram. BNP. Rule out hepatic,  renal, and thyroid cause of the patient edema, which I doubt. Will diurese  him with Lasix and support this with potassium to prevent rebound  hypokalemia from diuretics. Will follow his renal function, electrolytes  judiciously. The patient is tachycardiac and will be put on telemetry  monitoring with cardiac enzymes as a standard protocol, to rule out  cardiac/ischemia in a newly diagnosed heart failure patient. The patient's  admission blood pressure is 176/96 and he will need follow-up of his blood  pressure and low dose anti-hypertensive, preferably in hospital and later on  if he improves the beta blocker may be entertained for blood pressure  control if necessary.                                                Jackie Plum, M.D.    GO/MEDQ  D:  10/03/2002  T:  10/04/2002  Job:  [id number]   cc:   Theressa Millard, M.D.  [address] [place]  [place] [address]  Fax: [phone]

## 2011-03-12 ENCOUNTER — Ambulatory Visit: Payer: Self-pay | Admitting: Cardiology

## 2011-03-17 ENCOUNTER — Encounter: Payer: Self-pay | Admitting: Cardiology

## 2011-04-07 ENCOUNTER — Ambulatory Visit: Payer: Self-pay | Admitting: Cardiology

## 2011-04-14 ENCOUNTER — Encounter: Payer: Self-pay | Admitting: Cardiology

## 2011-04-24 ENCOUNTER — Encounter: Payer: Self-pay | Admitting: Cardiology

## 2011-04-24 ENCOUNTER — Ambulatory Visit (INDEPENDENT_AMBULATORY_CARE_PROVIDER_SITE_OTHER): Payer: Self-pay | Admitting: Cardiology

## 2011-04-24 DIAGNOSIS — I509 Heart failure, unspecified: Secondary | ICD-10-CM

## 2011-04-24 DIAGNOSIS — E669 Obesity, unspecified: Secondary | ICD-10-CM

## 2011-04-24 DIAGNOSIS — I10 Essential (primary) hypertension: Secondary | ICD-10-CM

## 2011-04-24 DIAGNOSIS — I4891 Unspecified atrial fibrillation: Secondary | ICD-10-CM

## 2011-04-24 NOTE — Patient Instructions (Signed)
Follow up in 1 year with Dr Hochrein.  You will receive a letter in the mail 2 months before you are due.  Please call us when you receive this letter to schedule your follow up appointment. Please continue your current medications as listed 

## 2011-04-24 NOTE — Assessment & Plan Note (Signed)
His BP was 152/90 when I repeated it.  It is 130s systolic when he gets it checked.  No change in therapy is indicated.

## 2011-04-24 NOTE — Assessment & Plan Note (Signed)
I am overwhelmed by his weight loss and congratulated him profusely. I encouraged more of the same.

## 2011-04-24 NOTE — Assessment & Plan Note (Signed)
Mr. Yunus Stoklosa seems to be euvolemic.  At this point, no change in therapy is indicated.  We have reviewed salt and fluid restrictions.  No further cardiovascular testing is indicated.

## 2011-04-24 NOTE — Progress Notes (Signed)
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 He is done exceedingly well since I last saw him. In the last year and a half he's lost 73 pounds! He is breathing better. He is off oxygen. He is walking routinely. The patient denies any new symptoms such as chest discomfort, neck or arm discomfort. There has been no new shortness of breath, PND or orthopnea. There have been no reported palpitations, presyncope or syncope.  No Known Allergies  Current Outpatient Prescriptions  Medication Sig Dispense Refill  . carvedilol (COREG) 6.25 MG tablet Take 1 tablet (6.25 mg total) by mouth 2 (two) times daily.  60 tablet  11  . furosemide (LASIX) 40 MG tablet Take 40 mg by mouth daily. Take 2 tabs       . lisinopril (PRINIVIL,ZESTRIL) 5 MG tablet Take 5 mg by mouth daily.        . potassium chloride (KLOR-CON 10) 10 MEQ CR tablet Take 1 tablet (10 mEq total) by mouth daily.  30 tablet  11  . warfarin (COUMADIN) 5 MG tablet Take 5 mg by mouth as directed.          Past Medical History  Diagnosis Date  . Cardiomyopathy     nonischemic  . CHF (congestive heart failure)     ejection fractio of 45% per echo in [address] (previos EF 35-40% in 2003)  . Alcoholism   . Atrial fibrillation or flutter   . Hypertension   . Obesity   . Ventricular tachycardia     nonsustained, asymptomatic  . Anemia   . Hypoxemia     Past Surgical History  Procedure Date  . Tonsillectomy     ROS:  As stated in the HPI and negative for all other systems.  PHYSICAL EXAM BP 174/90  Pulse 68  Resp 16  Ht 6' (1.829 m)  Wt 308 lb 6.4 oz (139.889 kg)  BMI 41.83 kg/m2 GENERAL:  Well appearing HEENT:  Pupils equal round and reactive, fundi not visualized, oral mucosa unremarkable NECK:  No jugular venous distention, waveform within normal limits, carotid upstroke brisk and symmetric, no bruits, no thyromegaly LYMPHATICS:  No cervical, inguinal adenopathy LUNGS:  Clear to auscultation  bilaterally BACK:  No CVA tenderness CHEST:  Unremarkable HEART:  PMI not displaced or sustained,S1 and S2 within normal limits, no S3, no S4, no clicks, no rubs, no murmurs, irregular ABD:  Flat, positive bowel sounds normal in frequency in pitch, no bruits, no rebound, no guarding, no midline pulsatile mass, no hepatomegaly, no splenomegaly, obese EXT:  2 plus pulses throughout, no edema, no cyanosis no clubbing SKIN:  No rashes no nodules NEURO:  Cranial nerves II through XII grossly intact, motor grossly intact throughout PSYCH:  Cognitively intact, oriented to person place and time  EKG:  Atrial fibrillation, rate 66, anterior R-wave progression, no acute ST-T wave changes  ASSESSMENT AND PLAN

## 2011-06-03 ENCOUNTER — Other Ambulatory Visit: Payer: Self-pay | Admitting: *Deleted

## 2011-06-03 MED ORDER — FUROSEMIDE 40 MG PO TABS: 40.0000 mg | ORAL_TABLET | Freq: Every day | ORAL | Status: DC

## 2012-02-08 ENCOUNTER — Other Ambulatory Visit: Payer: Self-pay | Admitting: Cardiology

## 2012-02-08 NOTE — Telephone Encounter (Signed)
..   Requested Prescriptions   Pending Prescriptions Disp Refills  . KLOR-CON 10 10 MEQ tablet [Pharmacy Med Name: KLOR-CON 10 MEQ TABLET] 30 tablet 3    Sig: TAKE 1 TABLET BY MOUTH EVERY DAY

## 2012-04-09 ENCOUNTER — Other Ambulatory Visit: Payer: Self-pay | Admitting: Cardiology

## 2012-05-31 ENCOUNTER — Ambulatory Visit: Payer: Medicare HMO | Admitting: Cardiology

## 2012-06-06 ENCOUNTER — Other Ambulatory Visit: Payer: Self-pay | Admitting: Cardiology

## 2012-06-08 ENCOUNTER — Other Ambulatory Visit: Payer: Self-pay | Admitting: Cardiology

## 2012-06-08 NOTE — Telephone Encounter (Signed)
Pt needs appointment then refill can be made Fax Received. Refill Completed. Jaslin Novitski Chowoe (R.M.A)   

## 2012-06-28 ENCOUNTER — Ambulatory Visit (INDEPENDENT_AMBULATORY_CARE_PROVIDER_SITE_OTHER): Payer: Medicare HMO | Admitting: Cardiology

## 2012-06-28 ENCOUNTER — Encounter: Payer: Self-pay | Admitting: Cardiology

## 2012-06-28 VITALS — BP 136/86 | HR 63 | Ht 72.0 in | Wt 299.8 lb

## 2012-06-28 DIAGNOSIS — I4891 Unspecified atrial fibrillation: Secondary | ICD-10-CM

## 2012-06-28 NOTE — Progress Notes (Signed)
HPI The patient presents for followup of his atrial fibrillation and and cardiomyopathy. He is done well since I last saw him. In the last year and a half he's lost another 9 pounds He is exercising routinely. The patient denies any new symptoms such as chest discomfort, neck or arm discomfort. There has been no new shortness of breath, PND or orthopnea. There have been no reported palpitations, presyncope or syncope.  He does not feel his atrial fibrillation.    No Known Allergies  Current Outpatient Prescriptions  Medication Sig Dispense Refill  . carvedilol (COREG) 6.25 MG tablet Take 1 tablet (6.25 mg total) by mouth 2 (two) times daily.  60 tablet  11  . furosemide (LASIX) 40 MG tablet TAKE 2 TABLETS BY MOUTH EVERY DAY  60 tablet  1  . KLOR-CON 10 10 MEQ tablet TAKE 1 TABLET BY MOUTH EVERY DAY  30 tablet  3  . lisinopril (PRINIVIL,ZESTRIL) 5 MG tablet Take 5 mg by mouth daily.        . pravastatin (PRAVACHOL) 10 MG tablet Take 10 mg by mouth daily.      Marland Kitchen warfarin (COUMADIN) 5 MG tablet Take 5 mg by mouth as directed.          Past Medical History  Diagnosis Date  . Cardiomyopathy     nonischemic  . CHF (congestive heart failure)     ejection fractio of 45% per echo in 2011 (previos EF 35-40% in 2003)  . Alcoholism   . Atrial fibrillation or flutter   . Hypertension   . Obesity   . Ventricular tachycardia     nonsustained, asymptomatic  . Anemia   . Hypoxemia     Past Surgical History  Procedure Date  . Tonsillectomy     ROS:  As stated in the HPI and negative for all other systems.  PHYSICAL EXAM BP 136/86  Pulse 63  Ht 6' (1.829 m)  Wt 299 lb 12.8 oz (135.988 kg)  BMI 40.66 kg/m2 GENERAL:  Well appearing HEENT:  Pupils equal round and reactive, fundi not visualized, oral mucosa unremarkable, disconjugate gaze and NECK:  No jugular venous distention, waveform within normal limits, carotid upstroke brisk and symmetric, no bruits, no thyromegaly LYMPHATICS:  No  cervical, inguinal adenopathy LUNGS:  Clear to auscultation bilaterally BACK:  No CVA tenderness CHEST:  Unremarkable HEART:  PMI not displaced or sustained,S1 and S2 within normal limits, no S3,  no clicks, no rubs, no murmurs, irregular ABD:  Flat, positive bowel sounds normal in frequency in pitch, no bruits, no rebound, no guarding, no midline pulsatile mass, no hepatomegaly, no splenomegaly, obese EXT:  2 plus pulses throughout, no edema, no cyanosis no clubbing SKIN:  No rashes no nodules NEURO:  Cranial nerves II through XII grossly intact, motor grossly intact throughout PSYCH:  Cognitively intact, oriented to person place and time  EKG:  Atrial fibrillation, rate 63, anterior R-wave progression, no acute ST-T wave changes.  06/28/2012  ASSESSMENT AND PLAN  ATRIAL FIBRILLATION - The patient  tolerates this rhythm and rate control and anticoagulation. We will continue with the meds as listed.  He is doing well on warfarin and so I will not change this.  CHF -  Mr. [name] seems to be euvolemic. At this point, no change in therapy is indicated. We have reviewed salt and fluid restrictions. No further cardiovascular testing is indicated.   HYPERTENSION -  The blood pressure is at target. No change in medications  is indicated. We will continue with therapeutic lifestyle changes (TLC).

## 2012-07-05 ENCOUNTER — Other Ambulatory Visit: Payer: Self-pay | Admitting: Cardiology

## 2012-07-05 NOTE — Telephone Encounter (Signed)
..   Requested Prescriptions   Pending Prescriptions Disp Refills  . furosemide (LASIX) 40 MG tablet [Pharmacy Med Name: FUROSEMIDE 40 MG TABLET] 60 tablet 2    Sig: TAKE 2 TABLETS BY MOUTH EVERY DAY   

## 2012-10-06 ENCOUNTER — Other Ambulatory Visit: Payer: Self-pay | Admitting: Cardiology

## 2012-12-25 ENCOUNTER — Emergency Department (HOSPITAL_COMMUNITY): Payer: Medicare HMO

## 2012-12-25 ENCOUNTER — Emergency Department (HOSPITAL_COMMUNITY)
Admission: EM | Admit: 2012-12-25 | Discharge: 2012-12-25 | Disposition: A | Discharge status: Home / Self Care | Payer: Medicare HMO | Attending: Emergency Medicine | Admitting: Emergency Medicine

## 2012-12-25 ENCOUNTER — Encounter (HOSPITAL_COMMUNITY): Payer: Self-pay | Admitting: Emergency Medicine

## 2012-12-25 DIAGNOSIS — I509 Heart failure, unspecified: Secondary | ICD-10-CM | POA: Insufficient documentation

## 2012-12-25 DIAGNOSIS — I10 Essential (primary) hypertension: Secondary | ICD-10-CM | POA: Insufficient documentation

## 2012-12-25 DIAGNOSIS — M722 Plantar fascial fibromatosis: Secondary | ICD-10-CM | POA: Insufficient documentation

## 2012-12-25 DIAGNOSIS — Z87891 Personal history of nicotine dependence: Secondary | ICD-10-CM | POA: Insufficient documentation

## 2012-12-25 DIAGNOSIS — I4892 Unspecified atrial flutter: Secondary | ICD-10-CM | POA: Insufficient documentation

## 2012-12-25 DIAGNOSIS — Z7901 Long term (current) use of anticoagulants: Secondary | ICD-10-CM | POA: Insufficient documentation

## 2012-12-25 DIAGNOSIS — Z79899 Other long term (current) drug therapy: Secondary | ICD-10-CM | POA: Insufficient documentation

## 2012-12-25 DIAGNOSIS — Z8679 Personal history of other diseases of the circulatory system: Secondary | ICD-10-CM | POA: Insufficient documentation

## 2012-12-25 DIAGNOSIS — IMO0001 Reserved for inherently not codable concepts without codable children: Secondary | ICD-10-CM | POA: Insufficient documentation

## 2012-12-25 DIAGNOSIS — I4891 Unspecified atrial fibrillation: Secondary | ICD-10-CM | POA: Insufficient documentation

## 2012-12-25 DIAGNOSIS — Z862 Personal history of diseases of the blood and blood-forming organs and certain disorders involving the immune mechanism: Secondary | ICD-10-CM | POA: Insufficient documentation

## 2012-12-25 DIAGNOSIS — E669 Obesity, unspecified: Secondary | ICD-10-CM | POA: Insufficient documentation

## 2012-12-25 MED ORDER — HYDROCODONE-ACETAMINOPHEN 5-325 MG PO TABS
2.0000 | ORAL_TABLET | Freq: Once | ORAL | Status: AC
  Administered 2012-12-25: 2 via ORAL
  Filled 2012-12-25: qty 2

## 2012-12-25 MED ORDER — IBUPROFEN 800 MG PO TABS: 800.0000 mg | ORAL_TABLET | Freq: Three times a day (TID) | ORAL | Status: DC

## 2012-12-25 MED ORDER — HYDROCODONE-ACETAMINOPHEN 5-325 MG PO TABS: 2.0000 | ORAL_TABLET | ORAL | Status: DC | PRN

## 2012-12-25 NOTE — ED Provider Notes (Signed)
History     CSN: [id number]  Arrival date & time 12/25/12  1021   First MD Initiated Contact with Patient 12/25/12 1038      Chief Complaint  Patient presents with  . Foot Pain    (Consider location/radiation/quality/duration/timing/severity/associated sxs/prior treatment) HPI Comments: Patient arrives by EMS for right foot pain for the past 2 days. He denies trauma. The pain is so bad that he can't walk. He's been taking Xanax for the pain. He's had panic this in the past but never this severe. Denies any fevers, chills or vomiting. Denies any other joint problems. The pain is on the plantar surface of his right foot and radiates to the dorsum. Is able to wiggle his toes. He has no proximal leg tenderness. He has no back, chest abdominal pain or shortness of breath. He has a history of nonischemic cardiac myopathy, atrial fibrillation on Coumadin.  The history is provided by the patient and the EMS personnel.    Past Medical History  Diagnosis Date  . Cardiomyopathy     nonischemic  . CHF (congestive heart failure)     ejection fractio of 45% per echo in 2011 (previos EF 35-40% in 2003)  . Alcoholism   . Atrial fibrillation or flutter   . Hypertension   . Obesity   . Ventricular tachycardia     nonsustained, asymptomatic  . Anemia   . Hypoxemia     Past Surgical History  Procedure Laterality Date  . Tonsillectomy      No family history on file.  History  Substance Use Topics  . Smoking status: Former Smoker    Start date: 06/29/1987  . Smokeless tobacco: Not on file  . Alcohol Use: Yes      Review of Systems  Constitutional: Negative for activity change and appetite change.  Respiratory: Negative for cough, chest tightness and shortness of breath.   Cardiovascular: Negative for chest pain.  Gastrointestinal: Negative for nausea, vomiting and abdominal pain.  Genitourinary: Negative for dysuria and hematuria.  Musculoskeletal: Positive for myalgias,  arthralgias and gait problem.  Skin: Negative for rash.  Neurological: Negative for dizziness, weakness and headaches.  A complete 10 system review of systems was obtained and all systems are negative except as noted in the HPI and PMH.    Allergies  Review of patient's allergies indicates no known allergies.  Home Medications   Current Outpatient Rx  Name  Route  Sig  Dispense  Refill  . aspirin 325 MG tablet   Oral   Take 325 mg by mouth every 6 (six) hours as needed for pain.         . carvedilol (COREG) 6.25 MG tablet   Oral   Take 6.25 mg by mouth 2 (two) times daily with a meal.         . furosemide (LASIX) 40 MG tablet   Oral   Take 80 mg by mouth daily.         Marland Kitchen lisinopril (PRINIVIL,ZESTRIL) 5 MG tablet   Oral   Take 5 mg by mouth daily.           . potassium chloride (K-DUR,KLOR-CON) 10 MEQ tablet   Oral   Take 10 mEq by mouth daily.         . pravastatin (PRAVACHOL) 10 MG tablet   Oral   Take 10 mg by mouth daily.         Marland Kitchen warfarin (COUMADIN) 5 MG tablet   Oral  Take 2.5-5 mg by mouth daily. 1 tab daily except 0.5 tabs daily on Sunday.         Marland Kitchen HYDROcodone-acetaminophen (NORCO/VICODIN) 5-325 MG per tablet   Oral   Take 2 tablets by mouth every 4 (four) hours as needed for pain.   10 tablet   0   . ibuprofen (ADVIL,MOTRIN) 800 MG tablet   Oral   Take 1 tablet (800 mg total) by mouth 3 (three) times daily.   21 tablet   0     BP 162/98  Pulse 70  Temp(Src) 98.4 F (36.9 C) (Oral)  Resp 18  SpO2 96%  Physical Exam  Constitutional: He is oriented to person, place, and time. He appears well-developed and well-nourished. No distress.  HENT:  Head: Normocephalic and atraumatic.  Mouth/Throat: Oropharynx is clear and moist. No oropharyngeal exudate.  Eyes: Conjunctivae and EOM are normal. Pupils are equal, round, and reactive to light.  Neck: Normal range of motion. Neck supple.  Cardiovascular: Normal rate, regular rhythm and  normal heart sounds.   No murmur heard. Pulmonary/Chest: Effort normal and breath sounds normal. No respiratory distress.  Abdominal: Soft. Bowel sounds are normal. There is no tenderness. There is no rebound and no guarding.  obese  Musculoskeletal: Normal range of motion. He exhibits no edema and no tenderness.  There is no deformity or swelling to the right foot. Intact DP and PT pulse. Able to wiggle toes. Ankle flexion and extension intact. No tenderness to palpation over the malleoli. No proximal fibula tenderness. No calf tenderness or asymmetry or swelling. Tenderness palpation along the plantar surface of the foot  Neurological: He is alert and oriented to person, place, and time. No cranial nerve deficit. He exhibits normal muscle tone. Coordination normal.    ED Course  Procedures (including critical care time)  Labs Reviewed - No data to display Dg Chest 2 View  12/25/2012  *RADIOLOGY REPORT*  Clinical Data: Shortness of breath.  History of atrial fibrillation.  CHEST - 2 VIEW  Comparison: 05/16/2010 chest radiograph  Findings: Moderate cardiomegaly again noted. Pulmonary vascular congestion is identified. There is no evidence of focal airspace disease, pulmonary edema, suspicious pulmonary nodule/mass, pleural effusion, or pneumothorax. No acute bony abnormalities are identified.  IMPRESSION: Cardiomegaly with pulmonary vascular congestion.   Original Report Authenticated By: Harmon Pier, M.D.    Dg Ankle Complete Right  12/25/2012  **ADDENDUM** CREATED: 12/25/2012 11:21:49  Clinical data should read - RIGHT ankle pain.  **END ADDENDUM** SIGNED BY: Tinnie Gens T. Si Gaul, M.D.   12/25/2012  *RADIOLOGY REPORT*  Clinical Data: Left foot pain.  RIGHT ANKLE - COMPLETE 3+ VIEW  Comparison: None  Findings: There is no evidence of fracture, subluxation or dislocation. The joint spaces and ankle mortise are unremarkable. A large calcaneal spur is present. No other focal bony lesions are identified.  Mild soft tissue swelling is identified.  IMPRESSION: Soft tissue swelling without acute bony abnormality.  Large calcaneal spur.   Original Report Authenticated By: Harmon Pier, M.D.    Dg Foot Complete Right  12/25/2012  *RADIOLOGY REPORT*  Clinical Data: Right foot pain.  RIGHT FOOT COMPLETE - 3+ VIEW  Comparison: None  Findings: There is no evidence of acute fracture, subluxation or dislocation. The Lisfranc joints are intact. A large calcaneal spur is present.  No other focal bony abnormalities are noted. Mild soft tissue swelling is present. No radiopaque foreign bodies are identified.  IMPRESSION: Mild soft tissue swelling without acute bony abnormality.  Original Report Authenticated By: Harmon Pier, M.D.      1. Plantar fasciitis of right foot       MDM  Atraumatic right foot pain consistent with plantar fasciitis. Neurovascularly intact.  X-rays negative for fracture. Patient's pain is improved with anti-inflammatories and pain medication. 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 He is ambulatory in the ED.      Glynn Octave, MD 12/25/12 [phone]

## 2012-12-25 NOTE — ED Notes (Addendum)
Pt presenting to ed with c/o right foot pain. Pt states pain is worse when he applies  pressure pt states he is unable to ambulate. Per ems pt is unable to ambulate. Pt states he has had some swelling but he also has congestive heart failure and he gets swelling from time to time

## 2012-12-25 NOTE — ED Notes (Signed)
ZOX:[id number]> Expected date:12/25/12<BR> Expected time:10:20 AM<BR> Means of arrival:Ambulance<BR> Comments:<BR> Foot pain

## 2012-12-25 NOTE — ED Notes (Signed)
Pt unable to ambulate in hallway.

## 2012-12-29 ENCOUNTER — Other Ambulatory Visit: Payer: Self-pay | Admitting: Cardiology

## 2013-01-03 ENCOUNTER — Other Ambulatory Visit: Payer: Self-pay | Admitting: Cardiology

## 2013-01-03 NOTE — Telephone Encounter (Signed)
..   Requested Prescriptions   Pending Prescriptions Disp Refills  . furosemide (LASIX) 40 MG tablet [Pharmacy Med Name: FUROSEMIDE 40 MG TABLET] 60 tablet 2    Sig: TAKE 2 TABLETS BY MOUTH EVERY DAY

## 2013-01-29 ENCOUNTER — Other Ambulatory Visit: Payer: Self-pay | Admitting: Cardiology

## 2013-03-26 ENCOUNTER — Other Ambulatory Visit: Payer: Self-pay | Admitting: Cardiology

## 2013-04-11 ENCOUNTER — Inpatient Hospital Stay (HOSPITAL_COMMUNITY)
Admission: EM | Admit: 2013-04-11 | Discharge: 2013-04-14 | DRG: 603 | Disposition: A | Discharge status: Home Health | Payer: Medicare HMO | Attending: Internal Medicine | Admitting: Internal Medicine

## 2013-04-11 ENCOUNTER — Encounter (HOSPITAL_COMMUNITY): Payer: Self-pay | Admitting: *Deleted

## 2013-04-11 ENCOUNTER — Emergency Department (HOSPITAL_COMMUNITY): Payer: Medicare HMO

## 2013-04-11 DIAGNOSIS — S8000XA Contusion of unspecified knee, initial encounter: Secondary | ICD-10-CM

## 2013-04-11 DIAGNOSIS — N179 Acute kidney failure, unspecified: Secondary | ICD-10-CM

## 2013-04-11 DIAGNOSIS — R791 Abnormal coagulation profile: Secondary | ICD-10-CM

## 2013-04-11 DIAGNOSIS — I4891 Unspecified atrial fibrillation: Secondary | ICD-10-CM | POA: Diagnosis present

## 2013-04-11 DIAGNOSIS — L02419 Cutaneous abscess of limb, unspecified: Principal | ICD-10-CM | POA: Diagnosis present

## 2013-04-11 DIAGNOSIS — W19XXXA Unspecified fall, initial encounter: Secondary | ICD-10-CM | POA: Diagnosis present

## 2013-04-11 DIAGNOSIS — Z79899 Other long term (current) drug therapy: Secondary | ICD-10-CM

## 2013-04-11 DIAGNOSIS — Z7901 Long term (current) use of anticoagulants: Secondary | ICD-10-CM

## 2013-04-11 DIAGNOSIS — I428 Other cardiomyopathies: Secondary | ICD-10-CM | POA: Diagnosis present

## 2013-04-11 DIAGNOSIS — Z6837 Body mass index (BMI) 37.0-37.9, adult: Secondary | ICD-10-CM

## 2013-04-11 DIAGNOSIS — Z87891 Personal history of nicotine dependence: Secondary | ICD-10-CM

## 2013-04-11 DIAGNOSIS — I10 Essential (primary) hypertension: Secondary | ICD-10-CM | POA: Diagnosis present

## 2013-04-11 DIAGNOSIS — L03115 Cellulitis of right lower limb: Secondary | ICD-10-CM

## 2013-04-11 DIAGNOSIS — S8001XA Contusion of right knee, initial encounter: Secondary | ICD-10-CM

## 2013-04-11 DIAGNOSIS — I5032 Chronic diastolic (congestive) heart failure: Secondary | ICD-10-CM | POA: Diagnosis present

## 2013-04-11 DIAGNOSIS — F1020 Alcohol dependence, uncomplicated: Secondary | ICD-10-CM | POA: Diagnosis present

## 2013-04-11 DIAGNOSIS — L03119 Cellulitis of unspecified part of limb: Secondary | ICD-10-CM

## 2013-04-11 DIAGNOSIS — I509 Heart failure, unspecified: Secondary | ICD-10-CM

## 2013-04-11 DIAGNOSIS — E669 Obesity, unspecified: Secondary | ICD-10-CM | POA: Diagnosis present

## 2013-04-11 DIAGNOSIS — S8010XA Contusion of unspecified lower leg, initial encounter: Secondary | ICD-10-CM | POA: Diagnosis present

## 2013-04-11 DIAGNOSIS — S8011XA Contusion of right lower leg, initial encounter: Secondary | ICD-10-CM

## 2013-04-11 LAB — URINALYSIS, ROUTINE W REFLEX MICROSCOPIC
Bilirubin Urine: NEGATIVE
Glucose, UA: NEGATIVE mg/dL
Hgb urine dipstick: NEGATIVE
Nitrite: NEGATIVE
Specific Gravity, Urine: 1.023 (ref 1.005–1.030)
pH: 5 (ref 5.0–8.0)

## 2013-04-11 LAB — COMPREHENSIVE METABOLIC PANEL
ALT: 15 U/L (ref 0–53)
AST: 24 U/L (ref 0–37)
Alkaline Phosphatase: 64 U/L (ref 39–117)
CO2: 28 mEq/L (ref 19–32)
Chloride: 94 mEq/L — ABNORMAL LOW (ref 96–112)
GFR calc non Af Amer: 24 mL/min — ABNORMAL LOW (ref >90)
Sodium: 137 mEq/L (ref 135–145)
Total Bilirubin: 1.3 mg/dL — ABNORMAL HIGH (ref 0.3–1.2)

## 2013-04-11 LAB — CBC WITH DIFFERENTIAL/PLATELET
Basophils Absolute: 0 10*3/uL (ref 0.0–0.1)
HCT: 38.1 % — ABNORMAL LOW (ref 39.0–52.0)
Lymphocytes Relative: 8 % — ABNORMAL LOW (ref 12–46)
Lymphs Abs: 0.9 10*3/uL (ref 0.7–4.0)
Neutro Abs: 8.3 10*3/uL — ABNORMAL HIGH (ref 1.7–7.7)
Platelets: 285 10*3/uL (ref 150–400)
RBC: 3.94 MIL/uL — ABNORMAL LOW (ref 4.22–5.81)
RDW: 13.3 % (ref 11.5–15.5)
WBC: 10.4 10*3/uL (ref 4.0–10.5)

## 2013-04-11 MED ORDER — SIMVASTATIN 5 MG PO TABS
5.0000 mg | ORAL_TABLET | Freq: Every day | ORAL | Status: DC
  Administered 2013-04-11 – 2013-04-13 (×3): 5 mg via ORAL
  Filled 2013-04-11 (×4): qty 1

## 2013-04-11 MED ORDER — ACETAMINOPHEN 325 MG PO TABS: 650.0000 mg | ORAL_TABLET | Freq: Four times a day (QID) | ORAL | Status: DC | PRN

## 2013-04-11 MED ORDER — TETANUS-DIPHTH-ACELL PERTUSSIS 5-2.5-18.5 LF-MCG/0.5 IM SUSP
0.5000 mL | Freq: Once | INTRAMUSCULAR | Status: AC
  Administered 2013-04-11: 0.5 mL via INTRAMUSCULAR
  Filled 2013-04-11: qty 0.5

## 2013-04-11 MED ORDER — SODIUM CHLORIDE 0.9 % IV SOLN
1.0000 mg | Freq: Once | INTRAVENOUS | Status: AC
  Administered 2013-04-11: 1 mg via INTRAVENOUS
  Filled 2013-04-11: qty 0.2

## 2013-04-11 MED ORDER — MORPHINE SULFATE 4 MG/ML IJ SOLN
6.0000 mg | Freq: Once | INTRAMUSCULAR | Status: AC
  Administered 2013-04-11: 6 mg via INTRAVENOUS
  Filled 2013-04-11: qty 2

## 2013-04-11 MED ORDER — SODIUM CHLORIDE 0.9 % IV SOLN
INTRAVENOUS | Status: DC
  Administered 2013-04-11 – 2013-04-14 (×4): via INTRAVENOUS

## 2013-04-11 MED ORDER — VITAMIN K1 10 MG/ML IJ SOLN
0.5000 mg | Freq: Once | INTRAVENOUS | Status: AC
  Administered 2013-04-11: 0.5 mg via INTRAVENOUS
  Filled 2013-04-11: qty 0.05

## 2013-04-11 MED ORDER — ACETAMINOPHEN 650 MG RE SUPP: 650.0000 mg | Freq: Four times a day (QID) | RECTAL | Status: DC | PRN

## 2013-04-11 MED ORDER — THIAMINE HCL 100 MG/ML IJ SOLN
100.0000 mg | Freq: Every day | INTRAMUSCULAR | Status: DC
  Administered 2013-04-11 – 2013-04-14 (×4): 100 mg via INTRAVENOUS
  Filled 2013-04-11 (×4): qty 1

## 2013-04-11 MED ORDER — VANCOMYCIN HCL 10 G IV SOLR
2000.0000 mg | Freq: Once | INTRAVENOUS | Status: AC
  Administered 2013-04-11: 2000 mg via INTRAVENOUS
  Filled 2013-04-11 (×2): qty 2000

## 2013-04-11 MED ORDER — PIPERACILLIN-TAZOBACTAM 3.375 G IVPB
3.3750 g | Freq: Three times a day (TID) | INTRAVENOUS | Status: DC
  Administered 2013-04-11 – 2013-04-12 (×3): 3.375 g via INTRAVENOUS
  Filled 2013-04-11 (×4): qty 50

## 2013-04-11 MED ORDER — VANCOMYCIN HCL 10 G IV SOLR: 1500.0000 mg | INTRAVENOUS | Status: DC

## 2013-04-11 MED ORDER — SODIUM CHLORIDE 0.9 % IJ SOLN
3.0000 mL | Freq: Two times a day (BID) | INTRAMUSCULAR | Status: DC
  Administered 2013-04-13 (×2): 3 mL via INTRAVENOUS

## 2013-04-11 MED ORDER — ONDANSETRON HCL 4 MG PO TABS: 4.0000 mg | ORAL_TABLET | Freq: Four times a day (QID) | ORAL | Status: DC | PRN

## 2013-04-11 MED ORDER — DOCUSATE SODIUM 100 MG PO CAPS
100.0000 mg | ORAL_CAPSULE | Freq: Two times a day (BID) | ORAL | Status: DC
  Administered 2013-04-11 – 2013-04-14 (×5): 100 mg via ORAL
  Filled 2013-04-11 (×7): qty 1

## 2013-04-11 MED ORDER — MORPHINE SULFATE 2 MG/ML IJ SOLN
1.0000 mg | INTRAMUSCULAR | Status: DC | PRN
  Administered 2013-04-11 – 2013-04-12 (×3): 1 mg via INTRAVENOUS
  Filled 2013-04-11 (×3): qty 1

## 2013-04-11 MED ORDER — ONDANSETRON HCL 4 MG/2ML IJ SOLN: 4.0000 mg | Freq: Four times a day (QID) | INTRAMUSCULAR | Status: DC | PRN

## 2013-04-11 MED ORDER — CARVEDILOL 6.25 MG PO TABS
6.2500 mg | ORAL_TABLET | Freq: Two times a day (BID) | ORAL | Status: DC
  Administered 2013-04-11 – 2013-04-14 (×6): 6.25 mg via ORAL
  Filled 2013-04-11 (×8): qty 1

## 2013-04-11 MED ORDER — CLINDAMYCIN PHOSPHATE 600 MG/50ML IV SOLN
600.0000 mg | Freq: Once | INTRAVENOUS | Status: AC
  Administered 2013-04-11: 600 mg via INTRAVENOUS
  Filled 2013-04-11: qty 50

## 2013-04-11 MED ORDER — HYDROCODONE-ACETAMINOPHEN 5-325 MG PO TABS
1.0000 | ORAL_TABLET | ORAL | Status: DC | PRN
  Administered 2013-04-11 (×2): 1 via ORAL
  Administered 2013-04-12 – 2013-04-14 (×9): 2 via ORAL
  Filled 2013-04-11 (×2): qty 2
  Filled 2013-04-11: qty 1
  Filled 2013-04-11 (×4): qty 2
  Filled 2013-04-11: qty 1
  Filled 2013-04-11 (×3): qty 2

## 2013-04-11 NOTE — H&P (Signed)
[facility] History and Physical  [name] ZOX:[id number] DOB: January 05, 1946 DOA: 04/11/2013  Referring physician: Dr Silverio Lay PCP: [name], MD  Specialists: Dr August Saucer.   Chief Complaint: Right leg pain, increase redness.   HPI: [name] is a 67 y.o. male with PMH significant for Cardiomyopathy Ef 45 % by ECHO 2011, Heart Failure, A fib on chronic coumadin, who presents to ED complaining of right LE pain and increase swelling. Patient relates that 10 days ago he trip over and hit his right leg. He saw his PCP, x ray was negative for fracture, he was started on Doxycycline. He has been taking his coumadin. He saw his PCP today and was refer to ED for further evaluation.   He relates worsening pain, swelling and redness of his right leg. He has notice black discoloration and increase redness of right LE.  He denies fevers, chills, nausea or vomiting. He denies chest pain or dyspnea. He has decrease appetite. He has notice decrease urine out put. He had an episode of diarrhea a week ago which has resolved.   Patient was found to be in acute renal failure, supra therapeutic INR, X ray Tibia/fibula: No osseous abnormality. Soft tissue hematoma and/or abscess.  Review of Systems: Negative except as per HPI.   Past Medical History  Diagnosis Date  . Cardiomyopathy     nonischemic  . CHF (congestive heart failure)     ejection fractio of 45% per echo in 2011 (previos EF 35-40% in 2003)  . Alcoholism   . Atrial fibrillation or flutter   . Hypertension   . Obesity   . Ventricular tachycardia     nonsustained, asymptomatic  . Anemia   . Hypoxemia    Past Surgical History  Procedure Laterality Date  . Tonsillectomy     Social History:  reports that he has quit smoking. He started smoking about 25 years ago. He does not have any smokeless tobacco history on file. He reports that  drinks alcohol. He reports that he does not use illicit drugs.   No Known Allergies  Family  History: Mother died of COPD. Father has HTN.   Prior to Admission medications   Medication Sig Start Date End Date Taking? Authorizing Provider  aspirin 325 MG tablet Take 325 mg by mouth every 6 (six) hours as needed for pain.   Yes Historical Provider, MD  carvedilol (COREG) 6.25 MG tablet Take 6.25 mg by mouth 2 (two) times daily with a meal.   Yes Historical Provider, MD  furosemide (LASIX) 40 MG tablet Take 80 mg by mouth daily.   Yes Historical Provider, MD  KLOR-CON 10 10 MEQ tablet TAKE 1 TABLET BY MOUTH EVERY DAY 01/29/13  Yes [name], MD  lisinopril (PRINIVIL,ZESTRIL) 5 MG tablet Take 5 mg by mouth daily.     Yes Historical Provider, MD  potassium chloride (K-DUR,KLOR-CON) 10 MEQ tablet Take 10 mEq by mouth daily.   Yes Historical Provider, MD  pravastatin (PRAVACHOL) 10 MG tablet Take 10 mg by mouth daily.   Yes Historical Provider, MD  warfarin (COUMADIN) 5 MG tablet Take 5 mg by mouth daily.    Yes Historical Provider, MD   Physical Exam: Filed Vitals:   04/11/13 0918  BP: 113/73  Pulse: 74  Temp: 98.5 F (36.9 C)  TempSrc: Oral  Resp: 18  SpO2: 98%   General Appearance:    Alert, cooperative, no distress, appears older than stated age  Head:    Normocephalic,  without obvious abnormality, atraumatic  Eyes:    PERRL, conjunctiva/corneas clear, EOM's intact,       Ears:    Normal TM's and external ear canals, both ears  Nose:   Nares normal, septum midline, mucosa normal, no drainage    or sinus tenderness  Throat:   Lips, mucosa, and tongue normal; poor dentition.   Neck:   Supple, symmetrical, trachea midline, no adenopathy;       thyroid:  No enlargement/tenderness/nodules; no carotid   bruit or JVD  Back:     Symmetric, no curvature, ROM normal, no CVA tenderness  Lungs:     Clear to auscultation bilaterally, respirations unlabored  Chest wall:    No tenderness or deformity  Heart:    Regular rate and rhythm, S1 and S2 normal, no murmur, rub   or gallop   Abdomen:     Soft, non-tender, bowel sounds active all four quadrants,    no masses, no organomegaly        Extremities:   Right LE with redness, hematoma calf, and knee, blister with black fluid content inferior aspect near knee/ necrotic tissue around knee. Left LE with venous stasis changes.   Pulses:   2+ and symmetric all extremities  Skin:   Skin color, texture, turgor normal, no rashes or lesions  Lymph nodes:   Cervical, supraclavicular, and axillary nodes normal  Neurologic:   CNII-XII intact. Normal strength, sensation and reflexes      throughout      Labs on Admission:  Basic Metabolic Panel:  Recent Labs Lab 04/11/13 0940  NA 137  K 4.0  CL 94*  CO2 28  GLUCOSE 108*  BUN 63*  CREATININE 2.64*  CALCIUM 10.1   Liver Function Tests:  Recent Labs Lab 04/11/13 0940  AST 24  ALT 15  ALKPHOS 64  BILITOT 1.3*  PROT 8.2  ALBUMIN 3.7   No results found for this basename: LIPASE, AMYLASE,  in the last 168 hours No results found for this basename: AMMONIA,  in the last 168 hours CBC:  Recent Labs Lab 04/11/13 0940  WBC 10.4  NEUTROABS 8.3*  HGB 12.8*  HCT 38.1*  MCV 96.7  PLT 285   Cardiac Enzymes:  Recent Labs Lab 04/11/13 0940  CKTOTAL 62    BNP (last 3 results) No results found for this basename: PROBNP,  in the last 8760 hours CBG: No results found for this basename: GLUCAP,  in the last 168 hours  Radiological Exams on Admission: Dg Tibia/fibula Right  04/11/2013   *RADIOLOGY REPORT*  Clinical Data: Swelling and bruising secondary to trauma 10 days ago.  RIGHT TIBIA AND FIBULA - 2 VIEW  Comparison: None  Findings: The tibia and fibula appear normal.  There is what is probably a soft tissue hematoma or abscess in the subcutaneous soft tissues at the anterior medial aspect of the right lower leg.  IMPRESSION: No osseous abnormality.  Soft tissue hematoma and/or abscess.   Original Report Authenticated By: Francene Boyers, M.D.   Dg Knee  Complete 4 Views Right  04/11/2013   *RADIOLOGY REPORT*  Clinical Data: Traumatic injury 10 days previous with proximal calf swelling  RIGHT KNEE - COMPLETE 4+ VIEW  Comparison: None.  Findings: No acute fracture or dislocation is noted.  There is a considerable amount of soft tissue swelling in the infrapatellar region anteriorly as well as a prominent 9 x 6 cm soft tissue density noted in the proximal medial calf.  This  is likely related to the recent injury and hematoma although the need for further evaluation can be determined on a clinical basis.  IMPRESSION: Likely soft tissue swelling and hematoma from recent injury.  No acute bony abnormality is noted.   Original Report Authenticated By: Alcide Clever, M.D.     Assessment/Plan   1-Right LE cellulitis/ Hematoma, ? Abscess: Admit to telemetry. I will start IV vancomycin and Zosyn. He received a dose of clindamycin. He received one dose of IV vitamin K. I will order 2 units of FFP. Hold coumadin. Repeat INR. Ortho consulted for further evaluation concern for infection / abscess.   2-Acute Renal Failure: This is likely in setting of infection, decrease volume, diuretic and ACE. Last Cr per our records was at 0.9 in 2012. Cr today at 2. 6. I will start IV fluids. Strict I and O. Monitor for pulmonary edema due to history of heart failure. CK at 62 no evidence of rhabdomyolysis. UA pending.   3-A fib: Continue with Coreg. Holding coumadin in setting of hematoma.   4-Supra-therapeutic INR: Hold coumadin. Received vitamin K. Will order 2 units of FFP. See problem one.   5-History of Alcohol use: Continue with Thiamine and Folate. He denies prior history of DT. Monitor on CIWA.      Code Status: Full Code, patient want to think about it. Please addressed again in am.  Family Communication: Care discussed with patient.  Disposition Plan: Expect 4 to 5 days inpatient.   Time spent: 75 minutes.   Julio Zappia [facility] Pager  [phone]  If 7PM-7AM, please contact night-coverage [email or web address] Password [email or web address] 04/11/2013, 12:20 PM

## 2013-04-11 NOTE — ED Provider Notes (Signed)
History    CSN: [id number] Arrival date & time 04/11/13  0902  First MD Initiated Contact with Patient 04/11/13 [phone]     Chief Complaint  Patient presents with  . Leg Pain   (Consider location/radiation/quality/duration/timing/severity/associated sxs/prior Treatment) The history is provided by the patient.  [name] is a 67 y.o. male history of A. fib on Coumadin, CHF, here presenting with right leg swelling and redness. He fell over a week ago and had normal x-rays. He was given doxycycline empirically to prevent infection. However over the last week the leg he came progressively more swollen and red. Denies any fevers or chills. Denies diabetes never had infections like this before.     Past Medical History  Diagnosis Date  . Cardiomyopathy     nonischemic  . CHF (congestive heart failure)     ejection fractio of 45% per echo in 2011 (previos EF 35-40% in 2003)  . Alcoholism   . Atrial fibrillation or flutter   . Hypertension   . Obesity   . Ventricular tachycardia     nonsustained, asymptomatic  . Anemia   . Hypoxemia    Past Surgical History  Procedure Laterality Date  . Tonsillectomy     No family history on file. History  Substance Use Topics  . Smoking status: Former Smoker    Start date: 06/29/1987  . Smokeless tobacco: Not on file  . Alcohol Use: Yes    Review of Systems  Cardiovascular: Positive for leg swelling.  Musculoskeletal:       R leg pain   All other systems reviewed and are negative.    Allergies  Review of patient's allergies indicates no known allergies.  Home Medications   Current Outpatient Rx  Name  Route  Sig  Dispense  Refill  . aspirin 325 MG tablet   Oral   Take 325 mg by mouth every 6 (six) hours as needed for pain.         . carvedilol (COREG) 6.25 MG tablet   Oral   Take 6.25 mg by mouth 2 (two) times daily with a meal.         . furosemide (LASIX) 40 MG tablet   Oral   Take 80 mg by mouth daily.          Marland Kitchen KLOR-CON 10 10 MEQ tablet      TAKE 1 TABLET BY MOUTH EVERY DAY   30 tablet   3   . lisinopril (PRINIVIL,ZESTRIL) 5 MG tablet   Oral   Take 5 mg by mouth daily.           . potassium chloride (K-DUR,KLOR-CON) 10 MEQ tablet   Oral   Take 10 mEq by mouth daily.         . pravastatin (PRAVACHOL) 10 MG tablet   Oral   Take 10 mg by mouth daily.         Marland Kitchen warfarin (COUMADIN) 5 MG tablet   Oral   Take 5 mg by mouth daily.           BP 113/73  Pulse 74  Temp(Src) 98.5 F (36.9 C) (Oral)  Resp 18  SpO2 98% Physical Exam  Nursing note and vitals reviewed. Constitutional: He is oriented to person, place, and time. He appears well-developed and well-nourished.  Comfortable   HENT:  Head: Normocephalic.  Mouth/Throat: Oropharynx is clear and moist.  Eyes: Conjunctivae are normal. Pupils are equal, round, and reactive to light.  Neck: Normal range of motion. Neck supple.  Cardiovascular: Normal rate, regular rhythm and normal heart sounds.   Pulmonary/Chest: Effort normal and breath sounds normal. No respiratory distress. He has no wheezes. He has no rales.  Abdominal: Soft. Bowel sounds are normal. He exhibits no distension. There is no tenderness. There is no rebound.  Musculoskeletal:  R lower leg swollen from the calf down. Able to feel faint pulse. Two blackened area around R knee cap and on the inside of the knee. Redness from knee down to ankle. Able to wiggle toes.   Neurological: He is alert and oriented to person, place, and time.  Skin: Skin is warm and dry.  See above   Psychiatric: He has a normal mood and affect. His behavior is normal. Judgment and thought content normal.    ED Course  Procedures (including critical care time) Labs Reviewed  CBC WITH DIFFERENTIAL - Abnormal; Notable for the following:    RBC 3.94 (*)    Hemoglobin 12.8 (*)    HCT 38.1 (*)    Neutrophils Relative % 80 (*)    Neutro Abs 8.3 (*)    Lymphocytes Relative 8 (*)     Monocytes Absolute 1.1 (*)    All other components within normal limits  COMPREHENSIVE METABOLIC PANEL - Abnormal; Notable for the following:    Chloride 94 (*)    Glucose, Bld 108 (*)    BUN 63 (*)    Creatinine, Ser 2.64 (*)    Total Bilirubin 1.3 (*)    GFR calc non Af Amer 24 (*)    GFR calc Af Amer 27 (*)    All other components within normal limits  LACTIC ACID, PLASMA - Abnormal; Notable for the following:    Lactic Acid, Venous 2.4 (*)    All other components within normal limits  PROTIME-INR - Abnormal; Notable for the following:    Prothrombin Time 51.7 (*)    INR 6.10 (*)    All other components within normal limits  URINALYSIS, ROUTINE W REFLEX MICROSCOPIC  CK   Dg Tibia/fibula Right  04/11/2013   *RADIOLOGY REPORT*  Clinical Data: Swelling and bruising secondary to trauma 10 days ago.  RIGHT TIBIA AND FIBULA - 2 VIEW  Comparison: None  Findings: The tibia and fibula appear normal.  There is what is probably a soft tissue hematoma or abscess in the subcutaneous soft tissues at the anterior medial aspect of the right lower leg.  IMPRESSION: No osseous abnormality.  Soft tissue hematoma and/or abscess.   Original Report Authenticated By: Francene Boyers, M.D.   Dg Knee Complete 4 Views Right  04/11/2013   *RADIOLOGY REPORT*  Clinical Data: Traumatic injury 10 days previous with proximal calf swelling  RIGHT KNEE - COMPLETE 4+ VIEW  Comparison: None.  Findings: No acute fracture or dislocation is noted.  There is a considerable amount of soft tissue swelling in the infrapatellar region anteriorly as well as a prominent 9 x 6 cm soft tissue density noted in the proximal medial calf.  This is likely related to the recent injury and hematoma although the need for further evaluation can be determined on a clinical basis.  IMPRESSION: Likely soft tissue swelling and hematoma from recent injury.  No acute bony abnormality is noted.   Original Report Authenticated By: Alcide Clever, M.D.   No  diagnosis found.  MDM  [name] is a 67 y.o. male here with R leg swelling and erythema. Has extensive cellulitis. Will  likely need IV abx. Will also get xray to r/o free air. I doubt DVT given that he is on coumadin but will check INR and if subtherapeutic will need duplex. Will likely need admission for cellulitis.   11:27 AM Xray showed possible hematoma. Given IV clinda. Cr 2.6, INR 6. Will give IV vitamin K to reverse coumadin since he has two large hematoma around R knee. Will admit to medicine under Dr. Sunnie Nielsen. She requested adding CK and call ortho for eval.    Richardean Canal, MD 04/11/13 1128

## 2013-04-11 NOTE — Consult Note (Signed)
Reason for Consult: Right knee swelling Referring Physician: dr Mindi [name] is an 67 y.o. male.  HPI: [name] is a 67 year old patient who fell 2 Saturdays ago onto his right knee. Had a mild abrasion at that time. The Sunday after the fall the knee began to swell. He was seen on Monday but very care physician he placed on antibiotics. Knees continued to swell. He has not had any fevers. He's really needed with renal dysfunction well swelling right leg. Been placed on IV antibiotics. Patient denies any fever and chills and his white count is 10,000 here in the hospital.  Past Medical History  Diagnosis Date  . Cardiomyopathy     nonischemic  . CHF (congestive heart failure)     ejection fractio of 45% per echo in 2011 (previos EF 35-40% in 2003)  . Alcoholism   . Atrial fibrillation or flutter   . Hypertension   . Obesity   . Ventricular tachycardia     nonsustained, asymptomatic  . Anemia   . Hypoxemia     Past Surgical History  Procedure Laterality Date  . Tonsillectomy      Family History  Problem Relation Age of Onset  . COPD Mother   . Hypertension Father     Social History:  reports that he has quit smoking. He started smoking about 25 years ago. He has never used smokeless tobacco. He reports that  drinks alcohol. He reports that he does not use illicit drugs.  Allergies: No Known Allergies  Medications: I have reviewed the patient's current medications.  Results for orders placed during the hospital encounter of 04/11/13 (from the past 48 hour(s))  CBC WITH DIFFERENTIAL     Status: Abnormal   Collection Time    04/11/13  9:40 AM      Result Value Range   WBC 10.4  4.0 - 10.5 K/uL   RBC 3.94 (*) 4.22 - 5.81 MIL/uL   Hemoglobin 12.8 (*) 13.0 - 17.0 g/dL   HCT 16.1 (*) 09.6 - 04.5 %   MCV 96.7  78.0 - 100.0 fL   MCH 32.5  26.0 - 34.0 pg   MCHC 33.6  30.0 - 36.0 g/dL   RDW 40.9  81.1 - 91.4 %   Platelets 285  150 - 400 K/uL   Neutrophils Relative %  80 (*) 43 - 77 %   Neutro Abs 8.3 (*) 1.7 - 7.7 K/uL   Lymphocytes Relative 8 (*) 12 - 46 %   Lymphs Abs 0.9  0.7 - 4.0 K/uL   Monocytes Relative 10  3 - 12 %   Monocytes Absolute 1.1 (*) 0.1 - 1.0 K/uL   Eosinophils Relative 1  0 - 5 %   Eosinophils Absolute 0.1  0.0 - 0.7 K/uL   Basophils Relative 0  0 - 1 %   Basophils Absolute 0.0  0.0 - 0.1 K/uL  COMPREHENSIVE METABOLIC PANEL     Status: Abnormal   Collection Time    04/11/13  9:40 AM      Result Value Range   Sodium 137  135 - 145 mEq/L   Potassium 4.0  3.5 - 5.1 mEq/L   Chloride 94 (*) 96 - 112 mEq/L   CO2 28  19 - 32 mEq/L   Glucose, Bld 108 (*) 70 - 99 mg/dL   BUN 63 (*) 6 - 23 mg/dL   Creatinine, Ser 7.82 (*) 0.50 - 1.35 mg/dL   Calcium 95.6  8.4 -  10.5 mg/dL   Total Protein 8.2  6.0 - 8.3 g/dL   Albumin 3.7  3.5 - 5.2 g/dL   AST 24  0 - 37 U/L   ALT 15  0 - 53 U/L   Alkaline Phosphatase 64  39 - 117 U/L   Total Bilirubin 1.3 (*) 0.3 - 1.2 mg/dL   GFR calc non Af Amer 24 (*) >90 mL/min   GFR calc Af Amer 27 (*) >90 mL/min   Comment:            The eGFR has been calculated     using the CKD EPI equation.     This calculation has not been     validated in all clinical     situations.     eGFR's persistently     <90 mL/min signify     possible Chronic Kidney Disease.  LACTIC ACID, PLASMA     Status: Abnormal   Collection Time    04/11/13  9:40 AM      Result Value Range   Lactic Acid, Venous 2.4 (*) 0.5 - 2.2 mmol/L  PROTIME-INR     Status: Abnormal   Collection Time    04/11/13  9:40 AM      Result Value Range   Prothrombin Time 51.7 (*) 11.6 - 15.2 seconds   INR 6.10 (*) 0.00 - 1.49   Comment: REPEATED TO VERIFY     CRITICAL RESULT CALLED TO, READ BACK BY AND VERIFIED WITH:     M MITCHELL AT 1053 ON 07.01.2014 BY NBROOKS  CK     Status: None   Collection Time    04/11/13  9:40 AM      Result Value Range   Total CK 62  7 - 232 U/L  PREPARE FRESH FROZEN PLASMA     Status: None   Collection Time     04/11/13  1:00 PM      Result Value Range   Unit Number [id number]     Blood Component Type THAWED PLASMA     Unit division 00     Status of Unit ISSUED     Transfusion Status OK TO TRANSFUSE     Unit Number [id number]     Blood Component Type THAWED PLASMA     Unit division 00     Status of Unit ISSUED     Transfusion Status OK TO TRANSFUSE    ABO/RH     Status: None   Collection Time    04/11/13  1:00 PM      Result Value Range   ABO/RH(D) A POS    URINALYSIS, ROUTINE W REFLEX MICROSCOPIC     Status: Abnormal   Collection Time    04/11/13  6:02 PM      Result Value Range   Color, Urine YELLOW  YELLOW   APPearance CLOUDY (*) CLEAR   Specific Gravity, Urine 1.023  1.005 - 1.030   pH 5.0  5.0 - 8.0   Glucose, UA NEGATIVE  NEGATIVE mg/dL   Hgb urine dipstick NEGATIVE  NEGATIVE   Bilirubin Urine NEGATIVE  NEGATIVE   Ketones, ur NEGATIVE  NEGATIVE mg/dL   Protein, ur NEGATIVE  NEGATIVE mg/dL   Urobilinogen, UA 0.2  0.0 - 1.0 mg/dL   Nitrite NEGATIVE  NEGATIVE   Leukocytes, UA NEGATIVE  NEGATIVE   Comment: MICROSCOPIC NOT DONE ON URINES WITH NEGATIVE PROTEIN, BLOOD, LEUKOCYTES, NITRITE, OR GLUCOSE <1000 mg/dL.    Dg Tibia/fibula Right  04/11/2013   *  RADIOLOGY REPORT*  Clinical Data: Swelling and bruising secondary to trauma 10 days ago.  RIGHT TIBIA AND FIBULA - 2 VIEW  Comparison: None  Findings: The tibia and fibula appear normal.  There is what is probably a soft tissue hematoma or abscess in the subcutaneous soft tissues at the anterior medial aspect of the right lower leg.  IMPRESSION: No osseous abnormality.  Soft tissue hematoma and/or abscess.   Original Report Authenticated By: Francene Boyers, M.D.   Dg Knee Complete 4 Views Right  04/11/2013   *RADIOLOGY REPORT*  Clinical Data: Traumatic injury 10 days previous with proximal calf swelling  RIGHT KNEE - COMPLETE 4+ VIEW  Comparison: None.  Findings: No acute fracture or dislocation is noted.  There is a considerable  amount of soft tissue swelling in the infrapatellar region anteriorly as well as a prominent 9 x 6 cm soft tissue density noted in the proximal medial calf.  This is likely related to the recent injury and hematoma although the need for further evaluation can be determined on a clinical basis.  IMPRESSION: Likely soft tissue swelling and hematoma from recent injury.  No acute bony abnormality is noted.   Original Report Authenticated By: Alcide Clever, M.D.    Review of Systems  Constitutional: Negative.   HENT: Negative.   Eyes: Negative.   Respiratory: Negative.   Cardiovascular: Negative.   Gastrointestinal: Negative.   Genitourinary: Negative.   Musculoskeletal: Positive for joint pain.  Skin: Negative.   Neurological: Negative.   Psychiatric/Behavioral: Negative.    Blood pressure 111/61, pulse 71, temperature 98.2 F (36.8 C), temperature source Oral, resp. rate 16, height 6' (1.829 m), weight 123.2 kg (271 lb 9.7 oz), SpO2 96.00%. Physical Exam  Constitutional: He appears well-developed.  HENT:  Head: Normocephalic.  Eyes: Pupils are equal, round, and reactive to light.  Neck: Normal range of motion.  Cardiovascular: Normal rate.   Respiratory: Effort normal.  Neurological: He is alert.  Skin: Skin is warm.  Psychiatric: He has a normal mood and affect.   examination the right knee demonstrates eschar the size of silver dollar over the right knee with tense swelling in the prepatellar bursa. There is surrounding redness but this doesn't look much like erythema actually extends down the leg but is not blanching. Pedal pulses palpable. There is no right knee effusion and no proximal lymphadenopathy. The medial aspect just below the medial joint line there is also extension of the swelling with the some potential pressure blisters underneath the skin. Compartments are otherwise soft and he has active ankle dorsiflexion plantarflexion. Radiographs are  normal.  Assessment/Plan: Impression is right lower extremity hematoma following fall on the knee. There is no intra-articular knee effusion but there is significant prepatellar blood accumulation. INR is elevated at 6.0. Patient has received vitamin K today. In the absence of fevers chills and normal white count I think unlikely this is infected but it is possible. I like to aspirate this area once his INR is below 2-1/2. In the meantime you metabolic he is on will cover the potential for infectious organism. He was potential for skin slough on the medial aspect of the proximal calf as well as the anterior aspect of the knee. We'll see him tomorrow and plan to aspirate the knee region once his INR decreases we'll plan to send the fluid for Gram stain and culture. 5 possible metastases this represents blood clot beneath the skin surface which may not be retrievable through the needle. DC n.p.o.  status for now  [facility] 04/11/2013, 9:42 PM

## 2013-04-11 NOTE — ED Notes (Signed)
Called lab to check on INR results, they stated it will resulted in 5 min. Dr Silverio Lay notified.

## 2013-04-11 NOTE — Progress Notes (Signed)
Explained to patient that we needed to collect a u/a once he voided.  When asked how long it has been since he voided, he stated this morning around 0500. Bladder scan performed which revealed >700 cc of urine. MD paged. New order to place foley catheter.  Will continue to monitor.

## 2013-04-11 NOTE — ED Notes (Signed)
MD at bedside. 

## 2013-04-11 NOTE — Progress Notes (Signed)
ANTIBIOTIC CONSULT NOTE - INITIAL  Pharmacy Consult for Vancomycin, Zosyn Indication: Cellulitis  No Known Allergies  Patient Measurements:   Adjusted Body Weight:   Vital Signs: Temp: 98.5 F (36.9 C) (07/01 0918) Temp src: Oral (07/01 0918) BP: 113/73 mmHg (07/01 0918) Pulse Rate: 74 (07/01 0918) Intake/Output from previous day:   Intake/Output from this shift:    Labs:  Recent Labs  04/11/13 0940  WBC 10.4  HGB 12.8*  PLT 285  CREATININE 2.64*   The CrCl is unknown because both a height and weight (above a minimum accepted value) are required for this calculation. No results found for this basename: VANCOTROUGH, VANCOPEAK, VANCORANDOM, GENTTROUGH, GENTPEAK, GENTRANDOM, TOBRATROUGH, TOBRAPEAK, TOBRARND, AMIKACINPEAK, AMIKACINTROU, AMIKACIN,  in the last 72 hours   Microbiology: No results found for this or any previous visit (from the past 720 hour(s)).  Medical History: Past Medical History  Diagnosis Date  . Cardiomyopathy     nonischemic  . CHF (congestive heart failure)     ejection fractio of 45% per echo in 2011 (previos EF 35-40% in 2003)  . Alcoholism   . Atrial fibrillation or flutter   . Hypertension   . Obesity   . Ventricular tachycardia     nonsustained, asymptomatic  . Anemia   . Hypoxemia     Assessment: 45 yoM presents with progressive right leg swelling and redness.  Pt reports falling a week ago, normal x-rays seen, and was given empiric doxycycline.   XRay today shows 2 large hematomas around right knee.  Pt to start on IV vancomycin and zosyn for extensive cellulitis.  To note, pt also on chronic coumadin for atrial fibrillation with INR 6.1 on admission.  Pt given Vitamin K 0.5mg  IV x 1.  NKDA.   7/1 >> Clindamycin x 1  7/1 >> Zosyn >> 7/1 >> Vancomycin >>  Renal: SCr 2.64.  No recent creatinine on file to trend - last lab 0.88 in 05/2010.  Per RN, weight = 122.2kg.  CrCl estimated ~37 ml/min. Normalized CrCl ~ 28. WBC: 10.4 Tm24h:  98.5  No cultures.   Goal of Therapy:  Vancomycin trough level 10-15 mcg/ml  Plan:  1.  Zosyn 3.375g IV q8h (infuse over 4 hours). 2.  Vancomycin 2000mg  IV x 1, then 1500mg  IV q 48 hours  3.  F/u renal function, WBC, Tm, Vancomycin trough, and clinical course.  Haynes Hoehn, PharmD 04/11/2013 12:45 PM  Pager: [phone]

## 2013-04-11 NOTE — ED Notes (Signed)
ZOX:[id number]<AV> Expected date:<BR> Expected time:<BR> Means of arrival:<BR> Comments:<BR> EMS-right leg pain

## 2013-04-11 NOTE — ED Notes (Signed)
Attempted to call report to floor, per secretary RN will call back when as soon as they are ready for report

## 2013-04-11 NOTE — ED Notes (Signed)
Dr Silverio Lay notified of pt's INR and PT results.

## 2013-04-11 NOTE — ED Notes (Signed)
Per EMS pt was sent from his PCP office with c/o leg pain. Per EMS pt fell last Monday and was seen by his PCP and was started on antibiotics to prevent infection. Pain has gotten much worse and now there is a redness, swelling and warmth to his RLE.

## 2013-04-11 NOTE — Progress Notes (Signed)
Patient declined activation of Mychart-he does not have a computer

## 2013-04-12 LAB — CBC
HCT: 32.1 % — ABNORMAL LOW (ref 39.0–52.0)
MCH: 32.3 pg (ref 26.0–34.0)
MCHC: 33.3 g/dL (ref 30.0–36.0)
RDW: 13.4 % (ref 11.5–15.5)

## 2013-04-12 LAB — PREPARE FRESH FROZEN PLASMA
Unit division: 0
Unit division: 0

## 2013-04-12 LAB — BASIC METABOLIC PANEL
BUN: 51 mg/dL — ABNORMAL HIGH (ref 6–23)
Chloride: 99 mEq/L (ref 96–112)
Creatinine, Ser: 1.63 mg/dL — ABNORMAL HIGH (ref 0.50–1.35)
GFR calc Af Amer: 49 mL/min — ABNORMAL LOW (ref >90)
GFR calc non Af Amer: 42 mL/min — ABNORMAL LOW (ref >90)

## 2013-04-12 LAB — PROTIME-INR: INR: 1.48 (ref 0.00–1.49)

## 2013-04-12 LAB — SYNOVIAL CELL COUNT + DIFF, W/ CRYSTALS: Crystals, Fluid: NONE SEEN

## 2013-04-12 MED ORDER — LORAZEPAM 0.5 MG PO TABS
0.2500 mg | ORAL_TABLET | Freq: Once | ORAL | Status: AC
  Administered 2013-04-12: 0.25 mg via ORAL
  Filled 2013-04-12: qty 1

## 2013-04-12 MED ORDER — VANCOMYCIN HCL 10 G IV SOLR
1750.0000 mg | INTRAVENOUS | Status: DC
  Filled 2013-04-12: qty 1750

## 2013-04-12 MED ORDER — LIDOCAINE HCL 2 % IJ SOLN: 20.0000 mL | Freq: Once | INTRAMUSCULAR | Status: DC

## 2013-04-12 MED ORDER — LIDOCAINE HCL 2 % IJ SOLN
INTRAMUSCULAR | Status: AC
  Filled 2013-04-12: qty 20

## 2013-04-12 MED ORDER — CLINDAMYCIN PHOSPHATE 600 MG/50ML IV SOLN
600.0000 mg | Freq: Three times a day (TID) | INTRAVENOUS | Status: DC
  Administered 2013-04-12 – 2013-04-14 (×6): 600 mg via INTRAVENOUS
  Filled 2013-04-12 (×7): qty 50

## 2013-04-12 NOTE — Progress Notes (Signed)
[facility] PROGRESS NOTE  [name] AVW:[id number] DOB: 25-Sep-1946 DOA: 04/11/2013 PCP: [name], MD  Assessment/Plan: Right Lower Extremity Hematoma/Cellulitis -Related to traumatic injury in face of elevated INR. -Plan for ortho to aspirate today. -No fever/leukocytosis; doubt infection. Will DC vanc/zosyn and continue clindamycin only for now.  Coagulopathy -Coumadin on hold and reversed with vit K and FFP for orthopedic procedure today.  ARF -Improved from 2.64 on admission to 1.63 7/2. -Continue IVF today but decrease rate to 75 cc/hr given h/o diastolic CHF. -Recheck renal function in am.  Atrial Fibrillation -Currently rate controlled on coreg. -Coumadin on hold and reversed given above. -Restart coumadin once ok with ortho.  ETOH Abuse history -Thiamine/folate. -Monitor on CIWA.   Code Status: Full code Family Communication: Patient only  Disposition Plan: Home when ready. Encourage ambulation. Request PT eval.   Consultants:  Ortho, Dr. August Saucer   Antibiotics:  Clinda day 7/2-->  Vanc/Zosyn 7/1-->7/2   Subjective: States he feels that his leg has improved. Wants to walk around.  Objective: Filed Vitals:   04/11/13 2055 04/12/13 0059 04/12/13 0100 04/12/13 0600  BP: 111/61 132/69  130/68  Pulse: 71 76  77  Temp: 98.2 F (36.8 C) 98.4 F (36.9 C) 99 F (37.2 C) 98 F (36.7 C)  TempSrc: Oral Oral Axillary Oral  Resp: 16 16  16   Height:      Weight:      SpO2:    99%    Intake/Output Summary (Last 24 hours) at 04/12/13 0948 Last data filed at 04/12/13 0630  Gross per 24 hour  Intake 2418.87 ml  Output   1350 ml  Net 1068.87 ml   Filed Weights   04/11/13 1731  Weight: 123.2 kg (271 lb 9.7 oz)    Exam:   General:  AA Ox3  Cardiovascular: RRR, no M/R/G  Respiratory: CTA B  Abdomen: S/NT/ND/+BS  Extremities: Large hematoma over right knee and medial aspect of right innler leg below knee. +distal pulses.    Neurologic:  Grossly intact and non-focal  Data Reviewed: Basic Metabolic Panel:  Recent Labs Lab 04/11/13 0940 04/12/13 0350  NA 137 138  K 4.0 3.9  CL 94* 99  CO2 28 28  GLUCOSE 108* 94  BUN 63* 51*  CREATININE 2.64* 1.63*  CALCIUM 10.1 9.3   Liver Function Tests:  Recent Labs Lab 04/11/13 0940  AST 24  ALT 15  ALKPHOS 64  BILITOT 1.3*  PROT 8.2  ALBUMIN 3.7   No results found for this basename: LIPASE, AMYLASE,  in the last 168 hours No results found for this basename: AMMONIA,  in the last 168 hours CBC:  Recent Labs Lab 04/11/13 0940 04/12/13 0350  WBC 10.4 9.1  NEUTROABS 8.3*  --   HGB 12.8* 10.7*  HCT 38.1* 32.1*  MCV 96.7 97.0  PLT 285 237   Cardiac Enzymes:  Recent Labs Lab 04/11/13 0940  CKTOTAL 62   BNP (last 3 results) No results found for this basename: PROBNP,  in the last 8760 hours CBG: No results found for this basename: GLUCAP,  in the last 168 hours  No results found for this or any previous visit (from the past 240 hour(s)).   Studies: Dg Tibia/fibula Right  04/11/2013   *RADIOLOGY REPORT*  Clinical Data: Swelling and bruising secondary to trauma 10 days ago.  RIGHT TIBIA AND FIBULA - 2 VIEW  Comparison: None  Findings: The tibia and fibula appear normal.  There is what is  probably a soft tissue hematoma or abscess in the subcutaneous soft tissues at the anterior medial aspect of the right lower leg.  IMPRESSION: No osseous abnormality.  Soft tissue hematoma and/or abscess.   Original Report Authenticated By: Francene Boyers, M.D.   Dg Knee Complete 4 Views Right  04/11/2013   *RADIOLOGY REPORT*  Clinical Data: Traumatic injury 10 days previous with proximal calf swelling  RIGHT KNEE - COMPLETE 4+ VIEW  Comparison: None.  Findings: No acute fracture or dislocation is noted.  There is a considerable amount of soft tissue swelling in the infrapatellar region anteriorly as well as a prominent 9 x 6 cm soft tissue density noted in the  proximal medial calf.  This is likely related to the recent injury and hematoma although the need for further evaluation can be determined on a clinical basis.  IMPRESSION: Likely soft tissue swelling and hematoma from recent injury.  No acute bony abnormality is noted.   Original Report Authenticated By: Alcide Clever, M.D.    Scheduled Meds: . carvedilol  6.25 mg Oral BID WC  . docusate sodium  100 mg Oral BID  . piperacillin-tazobactam (ZOSYN)  IV  3.375 g Intravenous Q8H  . simvastatin  5 mg Oral q1800  . sodium chloride  3 mL Intravenous Q12H  . thiamine  100 mg Intravenous Daily   Continuous Infusions: . sodium chloride 100 mL/hr at 04/12/13 0450    Principal Problem:   Traumatic hematoma of right lower leg Active Problems:   HYPERTENSION   Atrial fibrillation   CHF   Cellulitis of right leg   Acute renal failure    Time spent: 35 minutes.    Chaya Jan  [facility] Pager [phone]  If 7PM-7AM, please contact night-coverage at [email or web address], password [facility] 04/12/2013, 9:48 AM  LOS: 1 day

## 2013-04-12 NOTE — Progress Notes (Signed)
Patient is examined again today. He's had no fevers. Subjectively the patient reports that the swelling has gone down the right leg. I did aspirate about 5 cc of blood from the suprapatella bursa but could not get any more fluid to aspirate. That fluid was sent for Gram stain cell count culture and sensitivity although I think it's unlikely that culture). The fluid appeared to be blood and about having the infectious components by observation. I think it is unlikely that this area is infected currently but could become infected down the road. Thought favor observation for now unless there is something unusual within the cell count Gram stain and culture.

## 2013-04-12 NOTE — Progress Notes (Signed)
Pt's heart rhythm looks regular on the monitor, pt was in a.fib. EKG shows accelerated junctional rhythm. Pt asymptomatic. VS stable.  MD notified. Will continue to monitor.

## 2013-04-12 NOTE — Progress Notes (Signed)
ANTIBIOTIC CONSULT NOTE - INITIAL  Pharmacy Consult for Vancomycin, Zosyn Indication: Cellulitis  No Known Allergies  Patient Measurements: Height: 6' (182.9 cm) Weight: 271 lb 9.7 oz (123.2 kg) IBW/kg (Calculated) : 77.6 Adjusted Body Weight:   Vital Signs: Temp: 98 F (36.7 C) (07/02 0600) Temp src: Oral (07/02 0600) BP: 130/68 mmHg (07/02 0600) Pulse Rate: 77 (07/02 0600) Intake/Output from previous day: 07/01 0701 - 07/02 0700 In: 2418.9 [P.O.:342; I.V.:801.7; Blood:625; IV Piggyback:650.2] Out: 1350 [Urine:1350] Intake/Output from this shift:    Labs:  Recent Labs  04/11/13 0940 04/12/13 0350  WBC 10.4 9.1  HGB 12.8* 10.7*  PLT 285 237  CREATININE 2.64* 1.63*   Estimated Creatinine Clearance: 60.4 ml/min (by C-G formula based on Cr of 1.63). No results found for this basename: VANCOTROUGH, VANCOPEAK, VANCORANDOM, GENTTROUGH, GENTPEAK, GENTRANDOM, TOBRATROUGH, TOBRAPEAK, TOBRARND, AMIKACINPEAK, AMIKACINTROU, AMIKACIN,  in the last 72 hours   Microbiology: No results found for this or any previous visit (from the past 720 hour(s)).  Medical History: Past Medical History  Diagnosis Date  . Cardiomyopathy     nonischemic  . CHF (congestive heart failure)     ejection fractio of 45% per echo in 2011 (previos EF 35-40% in 2003)  . Alcoholism   . Atrial fibrillation or flutter   . Hypertension   . Obesity   . Ventricular tachycardia     nonsustained, asymptomatic  . Anemia   . Hypoxemia     Assessment: 30 yoM presents with progressive right leg swelling and redness.  Pt reports falling a week ago, normal x-rays seen, and was given empiric doxycycline.   XRay today shows 2 large hematomas around right knee.  Pt to start on IV vancomycin and zosyn for extensive cellulitis.  To note, pt also on chronic coumadin for atrial fibrillation with INR 6.1 on admission.  Pt given Vitamin K 0.5mg  IV x 1.  NKDA.   7/1 >> Clindamycin x 1  7/1 >> Zosyn >> 7/1 >>  Vancomycin >>  Renal: ARF (resolving. SCr 2.64 --> 1.63. CrCl estimated ~60 ml/min. Normalized CrCl ~ 86ml/min. WBC: 9.1 Tm24h: 99  No cultures.   Dose changes/levels 7/2: change to vancomycin 1750mg  IV q24h for improved SCr  No cultures.   Goal of Therapy:  Vancomycin trough level 10-15 mcg/ml  Plan:  Day #1 vancomycin/zosyn  Change vancomycin to 1750mg  IV q24h for improved renal fx  Check trough if remains on vancomycin for >4-5 days  Continue zosyn as ordered  Juliette Alcide, PharmD, BCPS.   Pager: [phone] 04/12/2013 9:00 AM

## 2013-04-13 LAB — CBC
MCH: 32.2 pg (ref 26.0–34.0)
MCHC: 33.1 g/dL (ref 30.0–36.0)
MCV: 97.1 fL (ref 78.0–100.0)
Platelets: 213 10*3/uL (ref 150–400)
RDW: 13.2 % (ref 11.5–15.5)
WBC: 10 10*3/uL (ref 4.0–10.5)

## 2013-04-13 LAB — BASIC METABOLIC PANEL
BUN: 29 mg/dL — ABNORMAL HIGH (ref 6–23)
CO2: 27 mEq/L (ref 19–32)
Calcium: 8.8 mg/dL (ref 8.4–10.5)
Creatinine, Ser: 1.19 mg/dL (ref 0.50–1.35)

## 2013-04-13 NOTE — Evaluation (Signed)
Physical Therapy Evaluation Patient Details Name: [name] MRN: [id number] DOB: 01/24/1946 Today's Date: 04/13/2013 Time: 1223-1250 PT Time Calculation (min): 27 min  PT Assessment / Plan / Recommendation History of Present Illness  67 yo male admitted 04/11/13 after fall, developed hematoma L lower leg. On admission INR suprtherapeutic. Pt will benefit from PT while in acute care.Pt lives with and takes care of elderly father. Pt states he plans to DC to home.  Clinical Impression  Pt had difficulty with weight bearing on RLE, pt will benefit from PT while in acute care. Will need to see how pt progresses for ability to return home.    PT Assessment  Patient needs continued PT services    Follow Up Recommendations  Home health PT;SNF    Does the patient have the potential to tolerate intense rehabilitation      Barriers to Discharge Decreased caregiver support      Equipment Recommendations  None recommended by PT    Recommendations for Other Services     Frequency Min 3X/week    Precautions / Restrictions Precautions Precautions: Fall Precaution Comments: R knee hematoma   Pertinent Vitals/Pain States leg is sore. Just got pain meds.      Mobility  Bed Mobility Bed Mobility: Supine to Sit;Sitting - Scoot to Edge of Bed Supine to Sit: HOB elevated;With rails;3: Mod assist Sitting - Scoot to Edge of Bed: 4: Min assist Details for Bed Mobility Assistance: support of RLE to keep leg off of bed to prevent pressure on R leg and hematoma. Transfers Transfers: Sit to Stand;Stand to Sit Sit to Stand: 1: +2 Total assist Sit to Stand: Patient Percentage: 60% Stand to Sit: 4: Min assist;To chair/3-in-1;With upper extremity assist Details for Transfer Assistance: Pt had difficulty with weight bearing on RLE, very antalgic. Ambulation/Gait Ambulation/Gait Assistance: Not tested (comment)    Exercises     PT Diagnosis: Difficulty walking;Generalized weakness;Acute pain   PT Problem List: Decreased strength;Decreased range of motion;Decreased activity tolerance;Decreased balance;Decreased mobility;Decreased knowledge of use of DME;Decreased safety awareness PT Treatment Interventions: DME instruction;Gait training;Stair training;Functional mobility training;Therapeutic activities;Patient/family education     PT Goals(Current goals can be found in the care plan section) Acute Rehab PT Goals Patient Stated Goal: To go home PT Goal Formulation: With patient Time For Goal Achievement: 04/27/13 Potential to Achieve Goals: Good  Visit Information  Last PT Received On: 04/13/13 Assistance Needed: +2 History of Present Illness: 67 yo male admitted 04/11/13 after fall, developed hematoma L lower leg. On admission INR suprtherapeutic. Pt will benefit from PT while in acute care.Pt lives with and takes care of elderly father. Pt states he plans to DC to home.       Prior Functioning  Home Living Family/patient expects to be discharged to:: Private residence Living Arrangements: Parent Available Help at Discharge: Family Type of Home: House Home Access: Stairs to enter Secretary/administrator of Steps: 1 Entrance Stairs-Rails: Right Home Layout: One level Home Equipment: Environmental consultant - 2 wheels;Walker - 4 wheels;Cane - single point Additional Comments: pt reports he cares for father. Prior Function Level of Independence: Independent Communication Communication: No difficulties    Cognition  Cognition Arousal/Alertness: Awake/alert Behavior During Therapy: WFL for tasks assessed/performed Overall Cognitive Status: Within Functional Limits for tasks assessed    Extremity/Trunk Assessment Upper Extremity Assessment Upper Extremity Assessment: Overall WFL for tasks assessed Lower Extremity Assessment Lower Extremity Assessment: Generalized weakness;RLE deficits/detail RLE Deficits / Details: pt has decreased tolerance to WB on RLE  Balance    End of Session  PT - End of Session Activity Tolerance: Patient limited by fatigue;Patient limited by pain Patient left: in chair;with call bell/phone within reach;with chair alarm set Nurse Communication: Mobility status  GP     Rada Hay 04/13/2013, 1:39 PM

## 2013-04-13 NOTE — Progress Notes (Signed)
Clinical Social Work Department BRIEF PSYCHOSOCIAL ASSESSMENT 04/13/2013  Patient:  [name], [name]     Account Number:  [id number]     Admit date:  04/11/2013  Clinical Social Worker:  Jacelyn Grip  Date/Time:  04/13/2013 02:00 PM  Referred by:  CSW  Date Referred:  04/13/2013 Referred for  SNF Placement   Other Referral:   Interview type:  Patient Other interview type:    PSYCHOSOCIAL DATA Living Status:  PARENTS Admitted from facility:   Level of care:   Primary support name:  Gene Ang/brother Primary support relationship to patient:  SIBLING Degree of support available:   unknown at this time, no family present at bedside    CURRENT CONCERNS Current Concerns  Post-Acute Placement   Other Concerns:    SOCIAL WORK ASSESSMENT / PLAN CSW reviewed chart and noted that PT recommending HH PT vs SNF depending on pt progress.    CSW met with pt re: disposition planning. CSW discussed recommendations from PT evaluation and pt states that he feels that within a few days that he will be able to walk with a walker and pt does not want SNF placement.    Pt plans to return home with home health services.    CSW notified RNCM who plans to follow up with pt re: HH needs.    No further social work needs identified at this time.    CSW signing off.    Please re-consult if further social work needs arise.   Assessment/plan status:  No Further Intervention Required Other assessment/ plan:   Information/referral to community resources:   referral to [facility] for [facility] needs    PATIENT'S/FAMILY'S RESPONSE TO PLAN OF CARE: Pt alert and oriented x 4. Pt wants to return home with [facility] services. Pt reports that he has a hospital bed, 4 wheel rolling walker, and toilet chair at home and would be agreeable to [facility] services. RNCM aware.    CSW signing off.    Please re-consult if further social work needs arise.       Jacklynn Lewis, MSW, LCSWA (coverage for Unice Bailey  [phone]) Clinical Social Work

## 2013-04-13 NOTE — Progress Notes (Signed)
[facility] PROGRESS NOTE  [name] ZOX:[id number] DOB: 1945-10-29 DOA: 04/11/2013 PCP: [name], MD  Assessment/Plan: Right Lower Extremity (Knee) Hematoma -Related to traumatic injury in face of elevated INR. -Ortho aspirated right knee yesterday. -Synovial fluid does not look infected. -No fever/leukocytosis; doubt infection. -Will DC clindamycin today.  Coagulopathy -Coumadin on hold and reversed with vit K and FFP for orthopedic procedure today.  ARF -Improved from 2.64 on admission to 1.63 7/2 to 1.19 on 7/3. -Continue IVF today but decrease rate to 75 cc/hr given h/o diastolic CHF. -Recheck renal function in am.  Atrial Fibrillation -Currently rate controlled on coreg. -Coumadin on hold and reversed given above. -Will keep off coumadin for now until he heals from #1.  ETOH Abuse history -Thiamine/folate. -Monitor on CIWA.   Code Status: Full code Family Communication: Patient only  Disposition Plan: Home when ready (anticipate 24-48 hours). Encourage ambulation. Request PT eval.   Consultants:  Ortho, Dr. August Saucer   Antibiotics:  Clinda day 7/2-->7/3  Uvaldo Bristle 7/1-->7/2   Subjective: States he feels that his leg has improved. Wants to walk around.  Objective: Filed Vitals:   04/12/13 1506 04/12/13 2249 04/12/13 2340 04/13/13 0624  BP: 123/59 97/57 100/50 123/64  Pulse: 73 72  78  Temp: 99 F (37.2 C) 98.1 F (36.7 C)  98.7 F (37.1 C)  TempSrc: Oral Oral  Oral  Resp: 20 20  20   Height:      Weight:    126.3 kg (278 lb 7.1 oz)  SpO2: 97% 97%  97%    Intake/Output Summary (Last 24 hours) at 04/13/13 1044 Last data filed at 04/13/13 0831  Gross per 24 hour  Intake 2402.5 ml  Output   1500 ml  Net  902.5 ml   Filed Weights   04/11/13 1731 04/13/13 0624  Weight: 123.2 kg (271 lb 9.7 oz) 126.3 kg (278 lb 7.1 oz)    Exam:   General:  AA Ox3  Cardiovascular: RRR, no M/R/G  Respiratory: CTA B  Abdomen:  S/NT/ND/+BS  Extremities: Large hematoma over right knee and medial aspect of right innler leg below knee. +distal pulses.   Neurologic:  Grossly intact and non-focal  Data Reviewed: Basic Metabolic Panel:  Recent Labs Lab 04/11/13 0940 04/12/13 0350 04/13/13 0500  NA 137 138 141  K 4.0 3.9 3.6  CL 94* 99 105  CO2 28 28 27   GLUCOSE 108* 94 108*  BUN 63* 51* 29*  CREATININE 2.64* 1.63* 1.19  CALCIUM 10.1 9.3 8.8   Liver Function Tests:  Recent Labs Lab 04/11/13 0940  AST 24  ALT 15  ALKPHOS 64  BILITOT 1.3*  PROT 8.2  ALBUMIN 3.7   No results found for this basename: LIPASE, AMYLASE,  in the last 168 hours No results found for this basename: AMMONIA,  in the last 168 hours CBC:  Recent Labs Lab 04/11/13 0940 04/12/13 0350 04/13/13 0500  WBC 10.4 9.1 10.0  NEUTROABS 8.3*  --   --   HGB 12.8* 10.7* 10.1*  HCT 38.1* 32.1* 30.5*  MCV 96.7 97.0 97.1  PLT 285 237 213   Cardiac Enzymes:  Recent Labs Lab 04/11/13 0940  CKTOTAL 62   BNP (last 3 results) No results found for this basename: PROBNP,  in the last 8760 hours CBG: No results found for this basename: GLUCAP,  in the last 168 hours  Recent Results (from the past 240 hour(s))  BODY FLUID CULTURE     Status: None  Collection Time    04/12/13  5:13 PM      Result Value Range Status   Specimen Description SYNOVIAL   Final   Special Requests NONE   Final   Gram Stain     Final   Value: FEW WBC PRESENT, PREDOMINANTLY PMN     NO ORGANISMS SEEN   Culture PENDING   Incomplete   Report Status PENDING   Incomplete     Studies: No results found.  Scheduled Meds: . carvedilol  6.25 mg Oral BID WC  . clindamycin (CLEOCIN) IV  600 mg Intravenous Q8H  . docusate sodium  100 mg Oral BID  . lidocaine  20 mL Intradermal Once  . simvastatin  5 mg Oral q1800  . sodium chloride  3 mL Intravenous Q12H  . thiamine  100 mg Intravenous Daily   Continuous Infusions: . sodium chloride 75 mL/hr at 04/12/13  1042    Principal Problem:   Traumatic hematoma of right lower leg Active Problems:   HYPERTENSION   Atrial fibrillation   CHF   Cellulitis of right leg   Acute renal failure    Time spent: 35 minutes.    Chaya Jan  [facility] Pager [phone]  If 7PM-7AM, please contact night-coverage at [email or web address], password [facility] 04/13/2013, 10:44 AM  LOS: 2 days

## 2013-04-13 NOTE — Clinical Documentation Improvement (Signed)
THIS DOCUMENT IS NOT A PERMANENT PART OF THE MEDICAL RECORD  Please update your documentation with the medical record to reflect your response to this query. If you need help knowing how to do this please call [phone].  04/13/13  Dear Dr.E [facility],  In a better effort to capture your patient's severity of illness, reflect appropriate length of stay and utilization of resources, a review of the patient medical record has revealed the following indicators the diagnosis of Heart Failure.    Based on your clinical judgment, please clarify and document in a progress note and/or discharge summary the clinical condition associated with the following supporting information:  In responding to this query please exercise your independent judgment.  The fact that a query is asked, does not imply that any particular answer is desired or expected.  04/13/13 Per prog note..."-Continue IVF today but decrease rate to 75 cc/hr given h/o diastolic CHF..."  After study and accurate Dx specificity & severity can noted "h/o diastolic CHF" be further specifed w/ acuity.  Thank you  Possible Clinical Conditions? Chronic Systolic or Diastolic Congestive Heart Failure Chronic  Systolic & Diastolic Congestive Heart Failure Acute Systolic or Diastolic Congestive Heart Failure Acute Systolic & Diastolic Congestive Heart Failure Acute on Chronic Systolic or DiastolicCongestive Heart Failure Acute on Chronic Systolic & Diastolic  Congestive Heart Failure Other Condition Cannot Clinically Determine  Supporting Information:  Risk Factors: 04/11/13 H&P.Marland KitchenMarland Kitchen"PMH significant for Cardiomyopathy Ef 45 % by ECHO 2011, Heart Failure, A fib on chronic coumadin"...  Signs & Symptoms:  Diagnostics:  Treatment: See above note and 04/11/13 H&P.Marland KitchenMarland Kitchen"Strict I and O. Monitor for pulmonary edema due to history of heart failure."...and conts home meds  Reviewed: additional documentation in the medical  record  Thank You,  Toribio Harbour, RN, BSN, CCDS Certified Clinical Documentation Specialist Pager: [phone] Health Information Management [place]

## 2013-04-13 NOTE — Progress Notes (Signed)
Talked to patient about DCP/ HHC; patient lives with his father at home, has a hospital bed, walkers, bedside commode and desires to go home at discharge with [facility] and not to a skilled nursing facility; home health care choices offered, patient requested [facility]; Valinda Party with [facility] called; Attending MD if in agreement for HHC at discharge please order HHRN/PT/OT/ nurses aide; B Ave Filter RN,BSN,MHA

## 2013-04-14 LAB — CBC
HCT: 29.5 % — ABNORMAL LOW (ref 39.0–52.0)
Hemoglobin: 9.7 g/dL — ABNORMAL LOW (ref 13.0–17.0)
MCH: 32.3 pg (ref 26.0–34.0)
MCHC: 32.9 g/dL (ref 30.0–36.0)
RBC: 3 MIL/uL — ABNORMAL LOW (ref 4.22–5.81)

## 2013-04-14 LAB — BASIC METABOLIC PANEL
BUN: 23 mg/dL (ref 6–23)
Chloride: 106 mEq/L (ref 96–112)
Glucose, Bld: 100 mg/dL — ABNORMAL HIGH (ref 70–99)
Potassium: 3.8 mEq/L (ref 3.5–5.1)
Sodium: 141 mEq/L (ref 135–145)

## 2013-04-14 MED ORDER — THIAMINE HCL 100 MG/ML IJ SOLN: 100.0000 mg | Freq: Every day | INTRAMUSCULAR | Status: DC

## 2013-04-14 MED ORDER — HYDROCODONE-ACETAMINOPHEN 5-325 MG PO TABS: 1.0000 | ORAL_TABLET | ORAL | Status: DC | PRN

## 2013-04-14 NOTE — Progress Notes (Signed)
Pt had 2.93sec pause. Asymptomatic, VS stable. Will continue to monitor.  Earnest Conroy. Clelia Croft, RN

## 2013-04-14 NOTE — Progress Notes (Signed)
MD notified of HR in 30s. Pt asymptomatic and sleeping, VS stable. Will continue to monitor.  [name]. Clelia Croft, RN

## 2013-04-14 NOTE — Progress Notes (Signed)
Discharge instructions explained to patient and brothre with teach back, prescriptions given, stable on discharge

## 2013-04-14 NOTE — Discharge Summary (Addendum)
Physician Discharge Summary  [name]:[id number] DOB: 10-21-1945 DOA: 04/11/2013  PCP: [name], MD  Admit date: 04/11/2013 Discharge date: 04/14/2013  Time spent: Greater than 30 minutes  Recommendations for Outpatient Follow-up:  -Advised to follow up with his PCP in 1 week, at which time decision should be made on whether safe to resume coumadin.   Discharge Diagnoses:  Principal Problem:   Traumatic hematoma of right lower leg Active Problems:   HYPERTENSION   Atrial fibrillation   Chronic diastolic CHF (congestive heart failure)   Cellulitis of right leg   Acute renal failure   Discharge Condition: Stable and improved.  Filed Weights   04/11/13 1731 04/13/13 0624 04/14/13 0500  Weight: 123.2 kg (271 lb 9.7 oz) 126.3 kg (278 lb 7.1 oz) 126.4 kg (278 lb 10.6 oz)    History of present illness:  Patient is a 67 y.o. male with PMH significant for Cardiomyopathy Ef 45 % by ECHO 2011, Heart Failure, A fib on chronic coumadin, who presents to ED complaining of right LE pain and increase swelling. Patient relates that 10 days ago he tripped and hit his right leg. He saw his PCP, x ray was negative for fracture, he was started on Doxycycline. He has been taking his coumadin. He saw his PCP today and was refer to ED for further evaluation.  He relates worsening pain, swelling and redness of his right leg. He has notice black discoloration and increase redness of right LE. He denies fevers, chills, nausea or vomiting. He denies chest pain or dyspnea. He has decrease appetite. He has notice decrease urine out put. He had an episode of diarrhea a week ago which has resolved.  Patient was found to be in acute renal failure, supra therapeutic INR, X ray Tibia/fibula: No osseous abnormality. Soft tissue hematoma and/or abscess.   Hospital Course:   Right Lower Extremity (Knee) Hematoma  -Related to traumatic injury in face of elevated INR.  -Coumadin was reversed with vit K and  FFP as it was >6 on admission. -Ortho aspirated right knee 7/2. -Synovial fluid does not look infected.  -No fever/leukocytosis; doubt infection.  -Will not receive further antibiotics on DC. -Please see below for further discussion on coumadin re initiation. -PT recommended SNF, however, he refused and prefers to have [facility] services, which will be arranged prior to DC.  Coagulopathy  -Coumadin on hold and reversed with vit K and FFP for orthopedic procedure today.   ARF  -Resolved: 2.64 on admission to 1.63 7/2 to 1.19 on 7/3 to 1.05 on 7/4. -Lasix and lisinopril are on hold for this reason.  Atrial Fibrillation  -Currently rate controlled on coreg.  -Coumadin on hold and reversed given above.  -Will keep off coumadin for now until he heals from #1.  -Have advised he follow up with his PCP in 1 week; if hematoma significantly improved, can consider when to re start coumadin. -Patient understands he will not be protected from stroke while off his coumadin.  ETOH Abuse history  -Thiamine/folate.  -No signs of withdrawal while in the hospital.  Chronic Diastolic CHF -Stable this admission.     Procedures:  Right knee aspiration 7/2.   Consultations:  Ortho, Dr. August Saucer  Discharge Instructions      Discharge Orders   Future Orders Complete By Expires     Diet - low sodium heart healthy  As directed     Discontinue IV  As directed     Increase activity slowly  As directed         Medication List    STOP taking these medications       aspirin 325 MG tablet     COUMADIN 5 MG tablet  Generic drug:  warfarin     furosemide 40 MG tablet  Commonly known as:  LASIX     KLOR-CON 10 10 MEQ tablet  Generic drug:  potassium chloride     lisinopril 5 MG tablet  Commonly known as:  PRINIVIL,ZESTRIL     potassium chloride 10 MEQ tablet  Commonly known as:  K-DUR,KLOR-CON      TAKE these medications       carvedilol 6.25 MG tablet  Commonly known as:  COREG  Take  6.25 mg by mouth 2 (two) times daily with a meal.     HYDROcodone-acetaminophen 5-325 MG per tablet  Commonly known as:  NORCO/VICODIN  Take 1-2 tablets by mouth every 4 (four) hours as needed.     pravastatin 10 MG tablet  Commonly known as:  PRAVACHOL  Take 10 mg by mouth daily.     thiamine 100 MG/ML injection  Commonly known as:  B-1  Inject 1 mL (100 mg total) into the vein daily.       No Known Allergies Follow-up Information   Follow up with [name], MD. Schedule an appointment as soon as possible for a visit in 1 week.   Contact information:   [address] [place] [address] [phone]        The results of significant diagnostics from this hospitalization (including imaging, microbiology, ancillary and laboratory) are listed below for reference.    Significant Diagnostic Studies: Dg Tibia/fibula Right  04/11/2013   *RADIOLOGY REPORT*  Clinical Data: Swelling and bruising secondary to trauma 10 days ago.  RIGHT TIBIA AND FIBULA - 2 VIEW  Comparison: None  Findings: The tibia and fibula appear normal.  There is what is probably a soft tissue hematoma or abscess in the subcutaneous soft tissues at the anterior medial aspect of the right lower leg.  IMPRESSION: No osseous abnormality.  Soft tissue hematoma and/or abscess.   Original Report Authenticated By: Francene Boyers, M.D.   Dg Knee Complete 4 Views Right  04/11/2013   *RADIOLOGY REPORT*  Clinical Data: Traumatic injury 10 days previous with proximal calf swelling  RIGHT KNEE - COMPLETE 4+ VIEW  Comparison: None.  Findings: No acute fracture or dislocation is noted.  There is a considerable amount of soft tissue swelling in the infrapatellar region anteriorly as well as a prominent 9 x 6 cm soft tissue density noted in the proximal medial calf.  This is likely related to the recent injury and hematoma although the need for further evaluation can be determined on a clinical basis.   IMPRESSION: Likely soft tissue swelling and hematoma from recent injury.  No acute bony abnormality is noted.   Original Report Authenticated By: Alcide Clever, M.D.    Microbiology: Recent Results (from the past 240 hour(s))  BODY FLUID CULTURE     Status: None   Collection Time    04/12/13  5:13 PM      Result Value Range Status   Specimen Description SYNOVIAL   Final   Special Requests NONE   Final   Gram Stain     Final   Value: FEW WBC PRESENT, PREDOMINANTLY PMN     NO ORGANISMS SEEN   Culture NO GROWTH  Final   Report Status PENDING   Incomplete     Labs: Basic Metabolic Panel:  Recent Labs Lab 04/11/13 0940 04/12/13 0350 04/13/13 0500 04/14/13 0443  NA 137 138 141 141  K 4.0 3.9 3.6 3.8  CL 94* 99 105 106  CO2 28 28 27 26   GLUCOSE 108* 94 108* 100*  BUN 63* 51* 29* 23  CREATININE 2.64* 1.63* 1.19 1.05  CALCIUM 10.1 9.3 8.8 8.9   Liver Function Tests:  Recent Labs Lab 04/11/13 0940  AST 24  ALT 15  ALKPHOS 64  BILITOT 1.3*  PROT 8.2  ALBUMIN 3.7   No results found for this basename: LIPASE, AMYLASE,  in the last 168 hours No results found for this basename: AMMONIA,  in the last 168 hours CBC:  Recent Labs Lab 04/11/13 0940 04/12/13 0350 04/13/13 0500 04/14/13 0443  WBC 10.4 9.1 10.0 10.2  NEUTROABS 8.3*  --   --   --   HGB 12.8* 10.7* 10.1* 9.7*  HCT 38.1* 32.1* 30.5* 29.5*  MCV 96.7 97.0 97.1 98.3  PLT 285 237 213 222   Cardiac Enzymes:  Recent Labs Lab 04/11/13 0940  CKTOTAL 62   BNP: BNP (last 3 results) No results found for this basename: PROBNP,  in the last 8760 hours CBG: No results found for this basename: GLUCAP,  in the last 168 hours     Signed:  Chaya Jan  [facility] Pager: [phone] 04/14/2013, 12:42 PM

## 2013-04-16 LAB — BODY FLUID CULTURE: Culture: NO GROWTH

## 2013-04-18 ENCOUNTER — Ambulatory Visit (INDEPENDENT_AMBULATORY_CARE_PROVIDER_SITE_OTHER): Payer: Medicare HMO | Admitting: Surgery

## 2013-04-18 ENCOUNTER — Encounter (INDEPENDENT_AMBULATORY_CARE_PROVIDER_SITE_OTHER): Payer: Self-pay | Admitting: Surgery

## 2013-04-18 VITALS — BP 140/74 | HR 88 | Temp 98.6°F | Resp 16 | Ht 72.0 in | Wt 285.6 lb

## 2013-04-18 DIAGNOSIS — S8011XA Contusion of right lower leg, initial encounter: Secondary | ICD-10-CM

## 2013-04-18 DIAGNOSIS — S8010XA Contusion of unspecified lower leg, initial encounter: Secondary | ICD-10-CM | POA: Insufficient documentation

## 2013-04-18 NOTE — Patient Instructions (Addendum)
Dr August Saucer will see you tomorrow in his office to follow this large hematoma and make decisions about additional therapy

## 2013-04-19 ENCOUNTER — Other Ambulatory Visit (HOSPITAL_COMMUNITY): Payer: Self-pay | Admitting: Orthopedic Surgery

## 2013-04-19 ENCOUNTER — Encounter (HOSPITAL_COMMUNITY): Payer: Self-pay | Admitting: Pharmacy Technician

## 2013-04-19 ENCOUNTER — Encounter (HOSPITAL_COMMUNITY): Payer: Self-pay | Admitting: *Deleted

## 2013-04-19 DIAGNOSIS — S8010XA Contusion of unspecified lower leg, initial encounter: Secondary | ICD-10-CM | POA: Insufficient documentation

## 2013-04-19 NOTE — Progress Notes (Signed)
04/19/13 1909  OBSTRUCTIVE SLEEP APNEA  Have you ever been diagnosed with sleep apnea through a sleep study? No  Do you snore loudly (loud enough to be heard through closed doors)?  0  Do you often feel tired, fatigued, or sleepy during the daytime? 1  Has anyone observed you stop breathing during your sleep? 0  Do you have, or are you being treated for high blood pressure? 1  BMI more than 35 kg/m2? 1  Age over 66 years old? 1  Gender: 1  Obstructive Sleep Apnea Score 5  Score 4 or greater  Results sent to PCP

## 2013-04-19 NOTE — Progress Notes (Signed)
[name]       DOB: 1946-01-01           DATE: 04/18/2013       YQM:[id number]  CC:  Chief Complaint  Patient presents with  . New Evaluation    eval infected hematoma    HPI: this patient comes to our urgent office for evaluation of a large right knee/calf hematoma. He apparently fell several weeks ago and developed a hematoma. At the time he was on Coumadin. He was admitted to the hospital and treated with antibiotics and seen by orthopedics. He was apparently discharged and seen by his primary physician today and referred to our urgent office. He thinks he areas is getting somewhat better. It is still uncomfortable and swollen. He was given a shot of antibiotics at the primary care physician office today. He is not having fevers or chills. He does think his calf and lower leg and gotten any more swollen than they were when he was in the hospital. He said he is not given any followup appointments when he was discharged from the hospital.  EXAM: Vital signs: BP 140/74  Pulse 88  Temp(Src) 98.6 F (37 C) (Temporal)  Resp 16  Ht 6' (1.829 m)  Wt 285 lb 9.6 oz (129.547 kg)  BMI 38.73 kg/m2  General: Patient alert, oriented, NAD  He has a large hematoma appearing area over the right patella and extending into the upper right medial tibial area. There 2 areas appear to have black necrotic skin overlying the hematoma. There does not appear to be any surrounding cellulitis. It is not particularly tender. An aspiration area are reviewed only a few droplets of blood so there does not appear to be a large abscess or liquefied hematoma. IMP: complex hematoma involving the right patellar and lower leg area medial superior.  PLAN: I think this is to be reevaluated by the orthopedist and originally saw him. I called Dr. August Saucer who saw him while he was hospitalized and he is happy to see him tomorrow morning. We made an appointment for him to be seen at 9:00 at Dr. Diamantina Providence office and given  instructions to followup with Dr. August Saucer. We will see him back here on a when necessary basis.  [name] J 04/19/2013

## 2013-04-20 ENCOUNTER — Encounter (HOSPITAL_COMMUNITY): Payer: Self-pay | Admitting: Surgery

## 2013-04-20 ENCOUNTER — Ambulatory Visit (HOSPITAL_COMMUNITY)
Admission: RE | Admit: 2013-04-20 | Discharge: 2013-04-20 | Disposition: A | Discharge status: Home / Self Care | Payer: Medicare HMO | Source: Ambulatory Visit | Attending: Orthopedic Surgery | Admitting: Orthopedic Surgery

## 2013-04-20 ENCOUNTER — Encounter (HOSPITAL_COMMUNITY): Payer: Self-pay | Admitting: Vascular Surgery

## 2013-04-20 ENCOUNTER — Ambulatory Visit (HOSPITAL_COMMUNITY): Payer: Medicare HMO | Admitting: Vascular Surgery

## 2013-04-20 ENCOUNTER — Encounter (HOSPITAL_COMMUNITY)
Admission: RE | Disposition: A | Discharge status: Home / Self Care | Payer: Self-pay | Source: Ambulatory Visit | Attending: Orthopedic Surgery

## 2013-04-20 DIAGNOSIS — IMO0002 Reserved for concepts with insufficient information to code with codable children: Secondary | ICD-10-CM | POA: Insufficient documentation

## 2013-04-20 DIAGNOSIS — D649 Anemia, unspecified: Secondary | ICD-10-CM | POA: Insufficient documentation

## 2013-04-20 DIAGNOSIS — S8001XS Contusion of right knee, sequela: Secondary | ICD-10-CM

## 2013-04-20 DIAGNOSIS — W19XXXS Unspecified fall, sequela: Secondary | ICD-10-CM | POA: Insufficient documentation

## 2013-04-20 DIAGNOSIS — I428 Other cardiomyopathies: Secondary | ICD-10-CM | POA: Insufficient documentation

## 2013-04-20 DIAGNOSIS — I872 Venous insufficiency (chronic) (peripheral): Secondary | ICD-10-CM | POA: Insufficient documentation

## 2013-04-20 DIAGNOSIS — I4729 Other ventricular tachycardia: Secondary | ICD-10-CM | POA: Insufficient documentation

## 2013-04-20 DIAGNOSIS — F1010 Alcohol abuse, uncomplicated: Secondary | ICD-10-CM | POA: Insufficient documentation

## 2013-04-20 DIAGNOSIS — I472 Ventricular tachycardia, unspecified: Secondary | ICD-10-CM | POA: Insufficient documentation

## 2013-04-20 DIAGNOSIS — Z79899 Other long term (current) drug therapy: Secondary | ICD-10-CM | POA: Insufficient documentation

## 2013-04-20 DIAGNOSIS — I509 Heart failure, unspecified: Secondary | ICD-10-CM | POA: Insufficient documentation

## 2013-04-20 DIAGNOSIS — I4891 Unspecified atrial fibrillation: Secondary | ICD-10-CM | POA: Insufficient documentation

## 2013-04-20 DIAGNOSIS — I10 Essential (primary) hypertension: Secondary | ICD-10-CM | POA: Insufficient documentation

## 2013-04-20 DIAGNOSIS — K219 Gastro-esophageal reflux disease without esophagitis: Secondary | ICD-10-CM | POA: Insufficient documentation

## 2013-04-20 DIAGNOSIS — Z87891 Personal history of nicotine dependence: Secondary | ICD-10-CM | POA: Insufficient documentation

## 2013-04-20 DIAGNOSIS — S8000XA Contusion of unspecified knee, initial encounter: Secondary | ICD-10-CM | POA: Insufficient documentation

## 2013-04-20 HISTORY — DX: Gastro-esophageal reflux disease without esophagitis: K21.9

## 2013-04-20 HISTORY — PX: KNEE BURSECTOMY: SHX5882

## 2013-04-20 SURGERY — BURSECTOMY, KNEE
Anesthesia: General | Site: Knee | Laterality: Right | Wound class: Dirty or Infected

## 2013-04-20 MED ORDER — ONDANSETRON HCL 4 MG/2ML IJ SOLN
INTRAMUSCULAR | Status: DC | PRN
  Administered 2013-04-20: 4 mg via INTRAVENOUS

## 2013-04-20 MED ORDER — PHENYLEPHRINE HCL 10 MG/ML IJ SOLN
INTRAMUSCULAR | Status: DC | PRN
  Administered 2013-04-20: 80 ug via INTRAVENOUS
  Administered 2013-04-20: 120 ug via INTRAVENOUS
  Administered 2013-04-20: 80 ug via INTRAVENOUS

## 2013-04-20 MED ORDER — MIDAZOLAM HCL 5 MG/5ML IJ SOLN
INTRAMUSCULAR | Status: DC | PRN
  Administered 2013-04-20: 2 mg via INTRAVENOUS

## 2013-04-20 MED ORDER — SODIUM CHLORIDE 0.9 % IR SOLN
Status: DC | PRN
  Administered 2013-04-20: 1000 mL

## 2013-04-20 MED ORDER — LACTATED RINGERS IV SOLN
INTRAVENOUS | Status: DC | PRN
  Administered 2013-04-20: 11:00:00 via INTRAVENOUS

## 2013-04-20 MED ORDER — PROPOFOL 10 MG/ML IV BOLUS
INTRAVENOUS | Status: DC | PRN
  Administered 2013-04-20: 180 mg via INTRAVENOUS

## 2013-04-20 MED ORDER — LIDOCAINE HCL (CARDIAC) 20 MG/ML IV SOLN
INTRAVENOUS | Status: DC | PRN
  Administered 2013-04-20: 30 mg via INTRAVENOUS

## 2013-04-20 MED ORDER — FENTANYL CITRATE 0.05 MG/ML IJ SOLN
INTRAMUSCULAR | Status: DC | PRN
  Administered 2013-04-20: 100 ug via INTRAVENOUS

## 2013-04-20 SURGICAL SUPPLY — 55 items
BANDAGE ELASTIC 4 VELCRO ST LF (GAUZE/BANDAGES/DRESSINGS) ×1 IMPLANT
BANDAGE ELASTIC 6 VELCRO ST LF (GAUZE/BANDAGES/DRESSINGS) ×1 IMPLANT
BANDAGE GAUZE ELAST BULKY 4 IN (GAUZE/BANDAGES/DRESSINGS) ×1 IMPLANT
BNDG COHESIVE 4X5 TAN STRL (GAUZE/BANDAGES/DRESSINGS) IMPLANT
CLOTH BEACON ORANGE TIMEOUT ST (SAFETY) ×2 IMPLANT
COVER SURGICAL LIGHT HANDLE (MISCELLANEOUS) ×2 IMPLANT
CUFF TOURNIQUET SINGLE 18IN (TOURNIQUET CUFF) ×2 IMPLANT
CUFF TOURNIQUET SINGLE 24IN (TOURNIQUET CUFF) IMPLANT
CUFF TOURNIQUET SINGLE 34IN LL (TOURNIQUET CUFF) IMPLANT
CUFF TOURNIQUET SINGLE 44IN (TOURNIQUET CUFF) IMPLANT
DRAPE U-SHAPE 47X51 STRL (DRAPES) ×2 IMPLANT
DRSG PAD ABDOMINAL 8X10 ST (GAUZE/BANDAGES/DRESSINGS) ×1 IMPLANT
DURAPREP 26ML APPLICATOR (WOUND CARE) ×2 IMPLANT
ELECT REM PT RETURN 9FT ADLT (ELECTROSURGICAL)
ELECTRODE REM PT RTRN 9FT ADLT (ELECTROSURGICAL) IMPLANT
FACESHIELD LNG OPTICON STERILE (SAFETY) ×2 IMPLANT
GAUZE XEROFORM 5X9 LF (GAUZE/BANDAGES/DRESSINGS) ×1 IMPLANT
GLOVE BIOGEL PI IND STRL 6.5 (GLOVE) IMPLANT
GLOVE BIOGEL PI IND STRL 8 (GLOVE) ×1 IMPLANT
GLOVE BIOGEL PI INDICATOR 6.5 (GLOVE) ×1
GLOVE BIOGEL PI INDICATOR 8 (GLOVE) ×1
GLOVE SURG ORTHO 8.0 STRL STRW (GLOVE) ×3 IMPLANT
GOWN PREVENTION PLUS LG XLONG (DISPOSABLE) IMPLANT
GOWN PREVENTION PLUS XLARGE (GOWN DISPOSABLE) ×2 IMPLANT
GOWN STRL NON-REIN LRG LVL3 (GOWN DISPOSABLE) ×4 IMPLANT
HANDPIECE INTERPULSE COAX TIP (DISPOSABLE)
KIT BASIN OR (CUSTOM PROCEDURE TRAY) ×2 IMPLANT
KIT ROOM TURNOVER OR (KITS) ×2 IMPLANT
MANIFOLD NEPTUNE II (INSTRUMENTS) ×2 IMPLANT
NS IRRIG 1000ML POUR BTL (IV SOLUTION) ×2 IMPLANT
PACK ORTHO EXTREMITY (CUSTOM PROCEDURE TRAY) ×2 IMPLANT
PAD ARMBOARD 7.5X6 YLW CONV (MISCELLANEOUS) ×4 IMPLANT
PAD CAST 4YDX4 CTTN HI CHSV (CAST SUPPLIES) IMPLANT
PADDING CAST COTTON 4X4 STRL (CAST SUPPLIES) ×2
PADDING CAST COTTON 6X4 STRL (CAST SUPPLIES) ×1 IMPLANT
SET HNDPC FAN SPRY TIP SCT (DISPOSABLE) IMPLANT
SPONGE GAUZE 4X4 12PLY (GAUZE/BANDAGES/DRESSINGS) IMPLANT
SPONGE LAP 18X18 X RAY DECT (DISPOSABLE) ×8 IMPLANT
SPONGE LAP 4X18 X RAY DECT (DISPOSABLE) ×2 IMPLANT
STOCKINETTE IMPERVIOUS 9X36 MD (GAUZE/BANDAGES/DRESSINGS) ×2 IMPLANT
SUT ETHILON 2 0 FS 18 (SUTURE) IMPLANT
SUT ETHILON 3 0 PS 1 (SUTURE) ×2 IMPLANT
SUT ETHILON 4 0 PS 2 18 (SUTURE) IMPLANT
SUT PROLENE 3 0 PS 2 (SUTURE) IMPLANT
SUT VIC AB 3-0 SH 27 (SUTURE)
SUT VIC AB 3-0 SH 27X BRD (SUTURE) IMPLANT
TOWEL OR 17X24 6PK STRL BLUE (TOWEL DISPOSABLE) ×2 IMPLANT
TOWEL OR 17X26 10 PK STRL BLUE (TOWEL DISPOSABLE) ×2 IMPLANT
TUBE ANAEROBIC SPECIMEN COL (MISCELLANEOUS) IMPLANT
TUBE CONNECTING 12X1/4 (SUCTIONS) ×2 IMPLANT
TUBING IRRIGATION (TUBING) ×1 IMPLANT
TUBING IRRIGATION STER IRD100 (TUBING) ×1 IMPLANT
UNDERPAD 30X30 INCONTINENT (UNDERPADS AND DIAPERS) ×2 IMPLANT
WATER STERILE IRR 1000ML POUR (IV SOLUTION) ×2 IMPLANT
YANKAUER SUCT BULB TIP NO VENT (SUCTIONS) ×2 IMPLANT

## 2013-04-20 NOTE — Anesthesia Procedure Notes (Signed)
Procedure Name: LMA Insertion Date/Time: 04/20/2013 12:07 PM Performed by: Rogelia Boga Pre-anesthesia Checklist: Patient identified, Timeout performed, Emergency Drugs available, Suction available and Patient being monitored Patient Re-evaluated:Patient Re-evaluated prior to inductionOxygen Delivery Method: Circle system utilized Preoxygenation: Pre-oxygenation with 100% oxygen Intubation Type: IV induction LMA: LMA inserted LMA Size: 5.0 Number of attempts: 1 Placement Confirmation: positive ETCO2 and breath sounds checked- equal and bilateral Tube secured with: Tape Dental Injury: Teeth and Oropharynx as per pre-operative assessment

## 2013-04-20 NOTE — Anesthesia Preprocedure Evaluation (Addendum)
Anesthesia Evaluation  Patient identified by MRN, date of birth, ID band Patient awake    Reviewed: Allergy & Precautions, H&P , NPO status , Patient's Chart, lab work & pertinent test results, reviewed documented beta blocker date and time   Airway Mallampati: II TM Distance: >3 FB Neck ROM: Full    Dental  (+) Poor Dentition and Dental Advisory Given   Pulmonary COPDformer smoker,          Cardiovascular hypertension, Pt. on medications and Pt. on home beta blockers +CHF + dysrhythmias Atrial Fibrillation     Neuro/Psych    GI/Hepatic GERD-  Controlled,  Endo/Other  Morbid obesity  Renal/GU ARFRenal diseaseARF resolving     Musculoskeletal   Abdominal   Peds  Hematology   Anesthesia Other Findings   Reproductive/Obstetrics                           Anesthesia Physical Anesthesia Plan  ASA: III  Anesthesia Plan: General   Post-op Pain Management:    Induction: Intravenous  Airway Management Planned: LMA  Additional Equipment:   Intra-op Plan:   Post-operative Plan: Extubation in OR  Informed Consent: I have reviewed the patients History and Physical, chart, labs and discussed the procedure including the risks, benefits and alternatives for the proposed anesthesia with the patient or authorized representative who has indicated his/her understanding and acceptance.   Dental advisory given  Plan Discussed with: CRNA, Anesthesiologist and Surgeon  Anesthesia Plan Comments:        Anesthesia Quick Evaluation

## 2013-04-20 NOTE — H&P (Signed)
[name] is an 67 y.o. male.   Chief Complaint: Right leg hematoma HPI: [name] is a 67 year old patient who fell proximal to 6 weeks ago while his INR level was high fell onto his right knee denies a second fracture but is developed progressive swelling and prepatellar bursal fluid collection. Please see the consult note from last week where I evaluated him. INR is elevated at that time he had significant hematoma in the prepatellar bursa extending proximally and medially on the tibia. This area was aspirated and there was negative for infection he has been on antibiotics since that time. He denies any fevers and he states that the swelling is actually decreasing but is developed some eschar over the patella and medially. Presents now for operative management of decompression of the hematoma.  Past Medical History  Diagnosis Date  . Cardiomyopathy     nonischemic  . CHF (congestive heart failure)     ejection fractio of 45% per echo in 2011 (previos EF 35-40% in 2003)  . Alcoholism   . Atrial fibrillation or flutter   . Hypertension   . Obesity   . Ventricular tachycardia     nonsustained, asymptomatic  . Anemia   . Hypoxemia   . GERD (gastroesophageal reflux disease)     takes tums prn    Past Surgical History  Procedure Laterality Date  . Tonsillectomy      Family History  Problem Relation Age of Onset  . COPD Mother   . Hypertension Father    Social History:  reports that he has quit smoking. He started smoking about 25 years ago. He has never used smokeless tobacco. He reports that he drinks about 6.0 ounces of alcohol per week. He reports that he does not use illicit drugs.  Allergies: No Known Allergies  Medications Prior to Admission  Medication Sig Dispense Refill  . amoxicillin-clavulanate (AUGMENTIN) 875-125 MG per tablet Take 1 tablet by mouth 2 (two) times daily. Take x14 days. Began regimen on 04/18/13.      . carvedilol (COREG) 6.25 MG tablet Take 6.25 mg by  mouth 2 (two) times daily with a meal.      . HYDROcodone-acetaminophen (NORCO/VICODIN) 5-325 MG per tablet Take 1-2 tablets by mouth every 4 (four) hours as needed. For pain      . pravastatin (PRAVACHOL) 10 MG tablet Take 10 mg by mouth daily.        No results found for this or any previous visit (from the past 48 hour(s)). No results found.  Review of Systems  Constitutional: Negative.   HENT: Negative.   Eyes: Negative.   Respiratory: Negative.   Cardiovascular: Negative.   Gastrointestinal: Negative.   Genitourinary: Negative.   Musculoskeletal: Positive for joint pain.  Skin: Negative.   Neurological: Negative.   Psychiatric/Behavioral: Negative.     Blood pressure 118/72, pulse 75, temperature 98.3 F (36.8 C), temperature source Oral, resp. rate 20, SpO2 95.00%. Physical Exam  Constitutional: He appears well-developed.  HENT:  Head: Atraumatic.  Eyes: Pupils are equal, round, and reactive to light.  Neck: Normal range of motion.  Cardiovascular: Normal rate.   Respiratory: Effort normal.  Neurological: He is alert.  Skin: Skin is warm.  Psychiatric: He has a normal mood and affect.   examination the right leg demonstrates venous stasis changes. His foot is perfused he has for back class an area of eschar over the patella. Has a significant prepatellar bursal fluid collection extending proximally medially of the  tibia. Eschars present as well here but it's more fracture blister which is unroofed. There is no knee effusion itself is extensor mechanism is intact no warmth in internal rotation the leg  Assessment/Plan Impression is right lower extremity prepatellar bursa hematoma in a patient his INR had been elevated this level of 6 currently it has decreased. He does have hematoma which I cannot drain out of the leg. Plan this time is for operative incision with urination and debridement hematoma followed by compression in the mobilization. As the formula to keep this in  getting infected and. He has eschar over the patella which would leave as is for now, secondary healing. That may change at the time of OR for testing unstable. This was discussed with patient questions answered indicating that on PRUDENCIO, VELAZCO SCOTT 04/20/2013, 11:50 AM

## 2013-04-20 NOTE — Transfer of Care (Signed)
Immediate Anesthesia Transfer of Care Note  Patient: [name]  Procedure(s) Performed: Procedure(s): Right knee prepatella bursa decompression (Right)  Patient Location: PACU  Anesthesia Type:General  Level of Consciousness: awake, alert , oriented and patient cooperative  Airway & Oxygen Therapy: Patient Spontanous Breathing and Patient connected to nasal cannula oxygen  Post-op Assessment: Report given to PACU RN, Post -op Vital signs reviewed and stable and Patient moving all extremities X 4  Post vital signs: Reviewed and stable  Complications: No apparent anesthesia complications

## 2013-04-20 NOTE — Anesthesia Postprocedure Evaluation (Signed)
  Anesthesia Post-op Note  Patient: [name]  Procedure(s) Performed: Procedure(s): Right knee prepatella bursa decompression (Right)  Patient Location: PACU  Anesthesia Type:General  Level of Consciousness: awake, alert  and oriented  Airway and Oxygen Therapy: Patient Spontanous Breathing  Post-op Pain: mild  Post-op Assessment: Post-op Vital signs reviewed, Patient's Cardiovascular Status Stable and Pain level controlled  Post-op Vital Signs: stable  Complications: No apparent anesthesia complications

## 2013-04-20 NOTE — Brief Op Note (Signed)
04/20/2013  1:04 PM  PATIENT:  [name]  67 y.o. male  PRE-OPERATIVE DIAGNOSIS:  Right knee hematoma  POST-OPERATIVE DIAGNOSIS:  Right knee hematoma  PROCEDURE:  Procedure(s): Right knee prepatella bursa decompression  SURGEON:  Surgeon(s): Cammy Copa, MD  ASSISTANT:   ANESTHESIA:   general  EBL: 20 ml    Total I/O In: 500 [I.V.:500] Out: -   BLOOD ADMINISTERED: none  DRAINS: none   LOCAL MEDICATIONS USED:  none  SPECIMEN:  No Specimen  COUNTS:  YES  TOURNIQUET:  * No tourniquets in log *  DICTATION: .Other Dictation: Dictation Number [id number]  PLAN OF CARE: Discharge to home after PACU  PATIENT DISPOSITION:  PACU - hemodynamically stable

## 2013-04-20 NOTE — Progress Notes (Signed)
Report given to Kay RN

## 2013-04-20 NOTE — Preoperative (Signed)
Beta Blockers   Reason not to administer Beta Blockers:Not Applicable, Pt took Coreg this am at 0400

## 2013-04-21 ENCOUNTER — Encounter (HOSPITAL_COMMUNITY): Payer: Self-pay | Admitting: Orthopedic Surgery

## 2013-04-21 NOTE — Op Note (Signed)
NAME:  [name], [name] NO.:  [id number]  MEDICAL RECORD NO.:  [id number]  LOCATION:  [facility]                         FACILITY:  [facility]  PHYSICIAN:  Burnard Bunting, M.D.    DATE OF BIRTH:  December 05, 1945  DATE OF PROCEDURE:  04/20/2013 DATE OF DISCHARGE:  04/20/2013                              OPERATIVE REPORT   PREOPERATIVE DIAGNOSIS:  Right knee prepatellar bursa hematoma.  POSTOPERATIVE DIAGNOSIS:  Right knee prepatellar bursa hematoma.  PROCEDURE:  Right knee prepatellar bursa hematoma decompression.  SURGEON:  Burnard Bunting, M.D.  ASSISTANT:  None.  ANESTHESIA:  General.  INDICATIONS:  Detric Scalisi is a patient with right knee prepatellar bursa hematoma formation 6 weeks ago when his INR was elevated.  Fluids has been very slow to resolve and creating some ischemic problems the overlying skin and presents now for operative decompression of the hematoma after failure of being able to aspirate the hematoma in the clinic.  PROCEDURE IN DETAIL:  The patient was brought to the operating room, where general endotracheal anesthesia was induced.  Perioperative IV antibiotics were continued.  Time-out was called.  Right leg was prepped with Hibiclens and saline draped in sterile manner.  Incision was made on the superior lateral margin of the patella.  Hematoma was present and was sent for culture.  Although it did not appear infected.  Second incision was made in the standard location for the superior medial arthroscopic portal.  Incision was made.  Hematoma was decompressed, approximately 600 mL of hematoma was evacuated from this space which was over the patellar tendon extending distal and medial.  Thorough irrigation with 6 L of irrigating solution was performed.  The 2 portal incisions were then closed loosely using 3-0 nylon, and a bulky dressing was applied.  Knee immobilizer also applied.  The patient tolerated the procedure well without immediate  complication.     Burnard Bunting, M.D.     GSD/MEDQ  D:  04/20/2013  T:  04/21/2013  Job:  [id number]

## 2013-04-22 ENCOUNTER — Emergency Department (HOSPITAL_COMMUNITY): Payer: Medicare HMO

## 2013-04-22 ENCOUNTER — Encounter (HOSPITAL_COMMUNITY): Payer: Self-pay

## 2013-04-22 ENCOUNTER — Inpatient Hospital Stay (HOSPITAL_COMMUNITY)
Admission: EM | Admit: 2013-04-22 | Discharge: 2013-04-25 | DRG: 871 | Disposition: A | Discharge status: Skilled Nursing Facility | Payer: Medicare HMO | Attending: Internal Medicine | Admitting: Internal Medicine

## 2013-04-22 DIAGNOSIS — L03115 Cellulitis of right lower limb: Secondary | ICD-10-CM

## 2013-04-22 DIAGNOSIS — R509 Fever, unspecified: Secondary | ICD-10-CM | POA: Diagnosis present

## 2013-04-22 DIAGNOSIS — I10 Essential (primary) hypertension: Secondary | ICD-10-CM

## 2013-04-22 DIAGNOSIS — A419 Sepsis, unspecified organism: Principal | ICD-10-CM | POA: Diagnosis present

## 2013-04-22 DIAGNOSIS — J9600 Acute respiratory failure, unspecified whether with hypoxia or hypercapnia: Secondary | ICD-10-CM

## 2013-04-22 DIAGNOSIS — I482 Chronic atrial fibrillation, unspecified: Secondary | ICD-10-CM | POA: Diagnosis present

## 2013-04-22 DIAGNOSIS — L03319 Cellulitis of trunk, unspecified: Secondary | ICD-10-CM | POA: Diagnosis present

## 2013-04-22 DIAGNOSIS — L0390 Cellulitis, unspecified: Secondary | ICD-10-CM

## 2013-04-22 DIAGNOSIS — I4891 Unspecified atrial fibrillation: Secondary | ICD-10-CM

## 2013-04-22 DIAGNOSIS — I509 Heart failure, unspecified: Secondary | ICD-10-CM

## 2013-04-22 DIAGNOSIS — D649 Anemia, unspecified: Secondary | ICD-10-CM | POA: Diagnosis present

## 2013-04-22 DIAGNOSIS — I428 Other cardiomyopathies: Secondary | ICD-10-CM | POA: Diagnosis present

## 2013-04-22 DIAGNOSIS — S8000XA Contusion of unspecified knee, initial encounter: Secondary | ICD-10-CM | POA: Diagnosis present

## 2013-04-22 DIAGNOSIS — I5023 Acute on chronic systolic (congestive) heart failure: Secondary | ICD-10-CM

## 2013-04-22 DIAGNOSIS — L02219 Cutaneous abscess of trunk, unspecified: Secondary | ICD-10-CM | POA: Diagnosis present

## 2013-04-22 DIAGNOSIS — X58XXXA Exposure to other specified factors, initial encounter: Secondary | ICD-10-CM | POA: Diagnosis present

## 2013-04-22 DIAGNOSIS — E785 Hyperlipidemia, unspecified: Secondary | ICD-10-CM | POA: Diagnosis present

## 2013-04-22 DIAGNOSIS — R627 Adult failure to thrive: Secondary | ICD-10-CM | POA: Diagnosis present

## 2013-04-22 DIAGNOSIS — I5043 Acute on chronic combined systolic (congestive) and diastolic (congestive) heart failure: Secondary | ICD-10-CM | POA: Diagnosis present

## 2013-04-22 DIAGNOSIS — B49 Unspecified mycosis: Secondary | ICD-10-CM | POA: Diagnosis present

## 2013-04-22 DIAGNOSIS — D638 Anemia in other chronic diseases classified elsewhere: Secondary | ICD-10-CM | POA: Diagnosis present

## 2013-04-22 DIAGNOSIS — L02419 Cutaneous abscess of limb, unspecified: Secondary | ICD-10-CM | POA: Diagnosis present

## 2013-04-22 DIAGNOSIS — K219 Gastro-esophageal reflux disease without esophagitis: Secondary | ICD-10-CM | POA: Diagnosis present

## 2013-04-22 DIAGNOSIS — D72829 Elevated white blood cell count, unspecified: Secondary | ICD-10-CM

## 2013-04-22 DIAGNOSIS — N5089 Other specified disorders of the male genital organs: Secondary | ICD-10-CM | POA: Diagnosis present

## 2013-04-22 DIAGNOSIS — L03119 Cellulitis of unspecified part of limb: Secondary | ICD-10-CM | POA: Diagnosis present

## 2013-04-22 DIAGNOSIS — B372 Candidiasis of skin and nail: Secondary | ICD-10-CM

## 2013-04-22 DIAGNOSIS — T148XXA Other injury of unspecified body region, initial encounter: Secondary | ICD-10-CM

## 2013-04-22 DIAGNOSIS — Z79899 Other long term (current) drug therapy: Secondary | ICD-10-CM

## 2013-04-22 DIAGNOSIS — E669 Obesity, unspecified: Secondary | ICD-10-CM

## 2013-04-22 DIAGNOSIS — S8011XA Contusion of right lower leg, initial encounter: Secondary | ICD-10-CM

## 2013-04-22 DIAGNOSIS — J9601 Acute respiratory failure with hypoxia: Secondary | ICD-10-CM

## 2013-04-22 LAB — CBC WITH DIFFERENTIAL/PLATELET
Basophils Absolute: 0 10*3/uL (ref 0.0–0.1)
Basophils Relative: 0 % (ref 0–1)
Eosinophils Absolute: 0.1 10*3/uL (ref 0.0–0.7)
HCT: 31 % — ABNORMAL LOW (ref 39.0–52.0)
MCHC: 31.9 g/dL (ref 30.0–36.0)
Monocytes Absolute: 0.6 10*3/uL (ref 0.1–1.0)
Neutro Abs: 10.7 10*3/uL — ABNORMAL HIGH (ref 1.7–7.7)
RDW: 13.2 % (ref 11.5–15.5)

## 2013-04-22 LAB — COMPREHENSIVE METABOLIC PANEL
AST: 23 U/L (ref 0–37)
Albumin: 2.5 g/dL — ABNORMAL LOW (ref 3.5–5.2)
Calcium: 9.1 mg/dL (ref 8.4–10.5)
Chloride: 107 mEq/L (ref 96–112)
Creatinine, Ser: 0.83 mg/dL (ref 0.50–1.35)
Total Bilirubin: 0.5 mg/dL (ref 0.3–1.2)
Total Protein: 7.3 g/dL (ref 6.0–8.3)

## 2013-04-22 LAB — URINALYSIS, ROUTINE W REFLEX MICROSCOPIC
Leukocytes, UA: NEGATIVE
Nitrite: NEGATIVE
Specific Gravity, Urine: 1.023 (ref 1.005–1.030)
Urobilinogen, UA: 0.2 mg/dL (ref 0.0–1.0)
pH: 5.5 (ref 5.0–8.0)

## 2013-04-22 LAB — BLOOD GAS, ARTERIAL
Acid-Base Excess: 2.1 mmol/L — ABNORMAL HIGH (ref 0.0–2.0)
Bicarbonate: 25.9 mEq/L — ABNORMAL HIGH (ref 20.0–24.0)
FIO2: 0.5 %
O2 Saturation: 99.3 %
pCO2 arterial: 39.8 mmHg (ref 35.0–45.0)
pO2, Arterial: 214 mmHg — ABNORMAL HIGH (ref 80.0–100.0)

## 2013-04-22 LAB — TROPONIN I: Troponin I: <0.3 ng/mL (ref <0.30)

## 2013-04-22 LAB — PRO B NATRIURETIC PEPTIDE: Pro B Natriuretic peptide (BNP): 6340 pg/mL — ABNORMAL HIGH (ref 0–125)

## 2013-04-22 LAB — WOUND CULTURE

## 2013-04-22 LAB — PROTIME-INR: INR: 1.36 (ref 0.00–1.49)

## 2013-04-22 MED ORDER — VANCOMYCIN HCL IN DEXTROSE 1-5 GM/200ML-% IV SOLN
1000.0000 mg | Freq: Once | INTRAVENOUS | Status: AC
  Administered 2013-04-22: 1000 mg via INTRAVENOUS
  Filled 2013-04-22: qty 200

## 2013-04-22 MED ORDER — FUROSEMIDE 10 MG/ML IJ SOLN
40.0000 mg | Freq: Once | INTRAMUSCULAR | Status: AC
  Administered 2013-04-22: 40 mg via INTRAVENOUS
  Filled 2013-04-22: qty 4

## 2013-04-22 MED ORDER — ASPIRIN 81 MG PO CHEW
324.0000 mg | CHEWABLE_TABLET | Freq: Once | ORAL | Status: AC
  Administered 2013-04-22: 324 mg via ORAL
  Filled 2013-04-22: qty 4

## 2013-04-22 MED ORDER — PIPERACILLIN-TAZOBACTAM 3.375 G IVPB 30 MIN
3.3750 g | Freq: Once | INTRAVENOUS | Status: AC
  Administered 2013-04-23: 3.375 g via INTRAVENOUS
  Filled 2013-04-22: qty 50

## 2013-04-22 MED ORDER — PIPERACILLIN-TAZOBACTAM 3.375 G IVPB: 3.3750 g | Freq: Once | INTRAVENOUS | Status: DC

## 2013-04-22 MED ORDER — IOHEXOL 300 MG/ML  SOLN
100.0000 mL | Freq: Once | INTRAMUSCULAR | Status: AC | PRN
  Administered 2013-04-22: 100 mL via INTRAVENOUS

## 2013-04-22 NOTE — ED Notes (Signed)
Contacted respiratory per request from [place], [place] for bipap for pt

## 2013-04-22 NOTE — ED Notes (Signed)
ZOX:[id number]<AV> Expected date:04/22/13<BR> Expected time: 6:38 PM<BR> Means of arrival:Ambulance<BR> Comments:<BR> Post op not eating

## 2013-04-22 NOTE — ED Notes (Signed)
Per EMS, Pt, from home, c/o weakness and "not eating."  Pt was recently discharged from [facility] after having "fluid removed from R knee."  Family reported failure to thrive to EMS.  Vitals are stable.  A & Ox4.   Denies pain.

## 2013-04-22 NOTE — H&P (Signed)
[facility] History and Physical  [name] ZOX:[id number] DOB: 30-Jun-1946 DOA: 04/22/2013  Referring physician: [place]our PCP: [name], MD   Chief Complaint: weakness, not eating  HPI: [name] is a 67 y.o. male who was recently admitted and treated for right leg cellulitis and traumatic hematoma in the setting of supratherapeutic Coumadin. He also had renal failure at that time. His Coumadin was reversed, he was on antibiotics. Lasix and ACE inhibitor were stopped. He was given IV fluids. The hematoma was aspirated by Dr. August Saucer. Skilled nursing facility was recommended but patient refused. The fluid culture was negative.   2 days ago, he had right knee prepatellar bursa hematoma decompression. Since then, his family reports that he has not been well, weak, and not eating and was therefore brought back to the emergency room. The patient himself doesn't give much history. Apparently, he was hypoxic, tachypneic and had labored breathing. He was placed on BiPAP. Chest x-ray showed congestive heart failure and proBNP was greater than 6000. He was given Lasix with good results. Last echocardiogram from 2011 shows an ejection fraction of 40%  In the emergency room, he spiked a temperature to 102.7. Blood cultures were drawn. No definite pneumonia on chest x-ray. Urinalysis negative. The right leg and knee does not have obvious signs of infection. Prepatellar bursa fluid Culture taken from 2 days ago is negative to date. On exam, the ED physician did note that he had severe fungal infection under his pannus, groin, perineum. Scrotal and testicular area was enlarged. Testicular Ultrasound showed chronic hydroceles or previous epididymitis but nothing acute. I initially came to evaluate the patient and ED physician had ordered a pelvic CAT scan to evaluate for gas/Forniers gangrene. It is back and shows no concerning findings. The patient denies testicular or scrotal or perineal pain. He is  not currently short of breath. He has been taken off BiPAP and breathing is currently in a nonlabored with good saturations.  Review of Systems:   Past Medical History  Diagnosis Date  . Cardiomyopathy     nonischemic  . CHF (congestive heart failure)     ejection fractio of 45% per echo in 2011 (previos EF 35-40% in 2003)  . Alcoholism   . Atrial fibrillation or flutter   . Hypertension   . Obesity   . Ventricular tachycardia     nonsustained, asymptomatic  . Anemia   . Hypoxemia   . GERD (gastroesophageal reflux disease)     takes tums prn   Past Surgical History  Procedure Laterality Date  . Tonsillectomy    . Knee bursectomy Right 04/20/2013    Procedure: Right knee prepatella bursa decompression;  Surgeon: Cammy Copa, MD;  Location: [facility] OR;  Service: Orthopedics;  Laterality: Right;   Social History:  reports that he has quit smoking. He started smoking about 25 years ago. He has never used smokeless tobacco. He reports that he drinks about 6.0 ounces of alcohol per week. He reports that he does not use illicit drugs. lives with his elderly father who is in his 19s  No Known Allergies  Family History  Problem Relation Age of Onset  . COPD Mother   . Hypertension Father     Prior to Admission medications   Medication Sig Start Date End Date Taking? Authorizing Provider  amoxicillin-clavulanate (AUGMENTIN) 875-125 MG per tablet Take 1 tablet by mouth 2 (two) times daily. Take x14 days. Began regimen on 04/18/13.   Yes Historical Provider, MD  carvedilol (COREG) 6.25 MG tablet Take 6.25 mg by mouth 2 (two) times daily with a meal.   Yes Historical Provider, MD  HYDROcodone-acetaminophen (NORCO/VICODIN) 5-325 MG per tablet Take 1-2 tablets by mouth every 4 (four) hours as needed. For pain 04/14/13  Yes Henderson Cloud, MD  pravastatin (PRAVACHOL) 10 MG tablet Take 10 mg by mouth daily.   Yes Historical Provider, MD   Physical Exam: Filed Vitals:   04/22/13  1911 04/22/13 1917 04/22/13 1930 04/22/13 2135  BP: 125/59  125/59   Pulse: 71  84   Temp: 99.7 F (37.6 C)   102.7 F (39.3 C)  TempSrc: Oral   Other (Comment)  Resp: 37  27   Height:   6' (1.829 m)   SpO2: 86% 89% 100%    BP 125/59  Pulse 84  Temp(Src) 102.7 F (39.3 C) (Other (Comment))  Resp 27  Ht 6' (1.829 m)  SpO2 100%  General Appearance:    groggy white male. Breathing nonlabored. Answers a few questions but quickly falls back asleep.   Head:    Normocephalic, without obvious abnormality, atraumatic  Eyes:    PERRL, conjunctiva/corneas clear, EOM's intact, fundi    benign, both eyes       Ears:    Normal TM's and external ear canals, both ears  Nose:   Nares normal, septum midline, mucosa normal, no drainage   or sinus tenderness  Throat:   Lips, mucosa, and tongue normal; teeth and gums normal  Neck:   Supple, no carotid bruits. JVD present   Back:     Symmetric, no curvature, ROM normal, no CVA tenderness  Lungs:     diminished bilaterally without wheezes rhonchi or rales. Per ED physician, had rales earlier prior to Lasix   Chest wall:    No tenderness or deformity  Heart:    irregularly irregular. No murmurs gallops rubs appreciated   Abdomen:     obese. Soft. Nontender. Peau d'orange noted in the lower abdominal/flank area   Genitalia:    Foley catheter draining clear urine. Enlarged scrotum and testicular masses   Rectal:    deferred   Extremities:    Right knee without any warmth, minimal erythema. Previously noted necrotic areas. Both legs with hyperpigmentation but no obvious cellulitis or abscess.brawny edema present bilaterally.   Pulses:   palpable   Skin:   severe erythema with whitish exudate and desquamation under pannus.  erythema also involves the groin and genitalia, scrotum and perineum. No abscess noted   Lymph nodes:   Cervical, supraclavicular, and axillary nodes normal  Neurologic:   no deficits noted.    Labs on Admission:  Basic Metabolic  Panel:  Recent Labs Lab 04/22/13 1954  NA 142  K 4.4  CL 107  CO2 28  GLUCOSE 112*  BUN 11  CREATININE 0.83  CALCIUM 9.1   Liver Function Tests:  Recent Labs Lab 04/22/13 1954  AST 23  ALT 19  ALKPHOS 64  BILITOT 0.5  PROT 7.3  ALBUMIN 2.5*   No results found for this basename: LIPASE, AMYLASE,  in the last 168 hours No results found for this basename: AMMONIA,  in the last 168 hours CBC:  Recent Labs Lab 04/22/13 1954  WBC 12.1*  NEUTROABS 10.7*  HGB 9.9*  HCT 31.0*  MCV 99.4  PLT 380   Cardiac Enzymes:  Recent Labs Lab 04/22/13 1954  TROPONINI <0.30    BNP (last 3 results)  Recent Labs  04/22/13 1954  PROBNP 6340.0*   CBG: No results found for this basename: GLUCAP,  in the last 168 hours  Radiological Exams on Admission: Ct Pelvis W Contrast  04/22/2013   *RADIOLOGY REPORT*  Clinical Data:  Bilateral scrotal loculated fluid collections. Clinical concern for gangrene.  CT PELVIS WITH CONTRAST  Technique:  Multidetector CT imaging of the pelvis was performed using the standard protocol following the bolus administration of intravenous contrast.  Contrast: OMNIPAQUE IOHEXOL 300 MG/ML  SOLN  Comparison:  Scrotal ultrasound obtained earlier today.  Findings:  Previously noted bilateral scrotal fluid collections. No soft tissue gas or abnormal enhancement in the scrotum or pelvis.  Foley catheter in the urinary bladder with associated air in the bladder.  No enlarged lymph nodes.  Lower lumbar spine degenerative changes.  IMPRESSION: Previously noted bilateral scrotal fluid collections.  No soft tissue gas.   Original Report Authenticated By: Beckie Salts, M.D.   US Scrotum  04/22/2013   *RADIOLOGY REPORT*  Clinical Data:  Large scrotal swelling.  SCROTAL ULTRASOUND DOPPLER ULTRASOUND OF THE TESTICLES  Technique: Complete ultrasound examination of the testicles, epididymis, and other scrotal structures was performed.  Color and spectral Doppler  ultrasound were also utilized to evaluate blood flow to the testicles.  Comparison:  None.  Findings:  Right testis:  Normal, measuring 3.9 x 2.9 x 1.8 cm  Left testis:  Normal, measuring 3.7 x 2.5 x 1.6 cm  Right epididymis:  Four large rounded fluid collections in the expected position of the head of the epididymis.  These contain low- level internal echoes.  The largest measures 4.3 cm in maximum diameter.  Additional smaller amount of fluid surrounding the right testicle and moderate amount of fluid containing more internal echoes inferior to the testicle.  The right epididymis is not visualized separate from these fluid collections.  Left epididymis:  Very large loculated fluid collection containing multiple thin and mildly thickened internal septations lateral and posterior to the left testicle, displacing the testicle medially and anteriorly.  This measures approximately 14.7 x 9.0 x 5.5 cm. The left epididymis is not visualized separate from this fluid collection.  Hydrocele:  Small right  Varicocele:  None.  Pulsed Doppler interrogation of both testes demonstrates low resistance flow bilaterally.  IMPRESSION: Large, loculated and septated fluid collections bilaterally, as described above.  Neither epididymis is visualized separate from these fluid collections.  These could be due to chronic or previous epididymitis with reactive, loculated and septated fluid collections.  These could also represent unusual loculated hydroceles.   Original Report Authenticated By: Beckie Salts, M.D.   [place] Art/ven Flow Abd Pelv Doppler Limited  04/22/2013   *RADIOLOGY REPORT*  Clinical Data:  Large scrotal swelling.  SCROTAL ULTRASOUND DOPPLER ULTRASOUND OF THE TESTICLES  Technique: Complete ultrasound examination of the testicles, epididymis, and other scrotal structures was performed.  Color and spectral Doppler ultrasound were also utilized to evaluate blood flow to the testicles.  Comparison:  None.  Findings:  Right  testis:  Normal, measuring 3.9 x 2.9 x 1.8 cm  Left testis:  Normal, measuring 3.7 x 2.5 x 1.6 cm  Right epididymis:  Four large rounded fluid collections in the expected position of the head of the epididymis.  These contain low- level internal echoes.  The largest measures 4.3 cm in maximum diameter.  Additional smaller amount of fluid surrounding the right testicle and moderate amount of fluid containing more internal echoes inferior to the testicle.  The right epididymis is  not visualized separate from these fluid collections.  Left epididymis:  Very large loculated fluid collection containing multiple thin and mildly thickened internal septations lateral and posterior to the left testicle, displacing the testicle medially and anteriorly.  This measures approximately 14.7 x 9.0 x 5.5 cm. The left epididymis is not visualized separate from this fluid collection.  Hydrocele:  Small right  Varicocele:  None.  Pulsed Doppler interrogation of both testes demonstrates low resistance flow bilaterally.  IMPRESSION: Large, loculated and septated fluid collections bilaterally, as described above.  Neither epididymis is visualized separate from these fluid collections.  These could be due to chronic or previous epididymitis with reactive, loculated and septated fluid collections.  These could also represent unusual loculated hydroceles.   Original Report Authenticated By: Beckie Salts, M.D.   Dg Chest Port 1 View  04/22/2013   *RADIOLOGY REPORT*  Clinical Data: Weakness.  Cardiomyopathy appear  PORTABLE CHEST - 1 VIEW  Comparison: And 12/25/2012.  Findings: Cardiomegaly.  Pulmonary vascular congestion/mild pulmonary edema.  No gross pneumothorax or segmental infiltrate.  Calcified aortic knob.  IMPRESSION: Cardiomegaly.  Pulmonary vascular congestion/mild pulmonary edema.   Original Report Authenticated By: Lacy Duverney, M.D.    EKG: ATRIAL FIBRILLATION. Wandering baseline. Incomplete RBBB  Assessment/Plan     Acute on chronic systolic CHF (congestive heart failure):  Last echocardiogram in 2011 showed ejection fraction of 45%. Will give Lasix 40 mg IV twice a day and resume ACE inhibitor. Repeat echocardiogram. Will monitor in step down overnight, as he initially required BIPAP, and now has a fever concerning for early sepsis. Daily weights. Ins and outs. Low salt diet. Monitor creatinine    Acute respiratory failure: Much improved. Currently doing well on nasal cannula after diuresis.    Fever, possible early sepsis the only obvious source is his severe Candidal intertrigo with presumed bacterial superinfection: Patient has been given vancomycin and Zosyn. We'll continue for now. Add Diflucan. Narrow antimicrobials quickly as able if patient improves.    Obesity, unspecified    HYPERTENSION    Atrial fibrillation: Off Coumadin    Traumatic hematoma of right lower leg    Anemia: check anemia panel  Weakness, failure to thrive: Will likely need skilled nursing facility.  Code Status: full code Family Communication: brother Disposition Plan: likely SNF  Time spent: 60 minutes  Lajuana Patchell L [facility] Pager [phone]  If 7PM-7AM, please contact night-coverage [email or web address] Password [facility] 04/22/2013, 11:51 PM

## 2013-04-22 NOTE — ED Notes (Signed)
[name] RN notified of elevated temp of 102.7 as reported from temp foley

## 2013-04-22 NOTE — ED Provider Notes (Signed)
History    CSN: [id number] Arrival date & time 04/22/13  1850  First MD Initiated Contact with Patient 04/22/13 1857     Chief Complaint  Patient presents with  . Weakness   (Consider location/radiation/quality/duration/timing/severity/associated sxs/prior Treatment) HPI Comments: Patient presents from home with anorexia, generalized weakness, inability to ambulate. He had a hematoma drained from his right knee 2 days ago by Dr. August Saucer. He states she's not been eating and drinking since then. Denies any vomiting, diarrhea, abdominal pain, fever, chills. No chest pain or shortness of breath. His knee pain is controlled. He states he is able to walk on it slightly. He admits to not taking care of himself at home.  The history is provided by the patient and the EMS personnel.   Past Medical History  Diagnosis Date  . Cardiomyopathy     nonischemic  . CHF (congestive heart failure)     ejection fractio of 45% per echo in 2011 (previos EF 35-40% in 2003)  . Alcoholism   . Atrial fibrillation or flutter   . Hypertension   . Obesity   . Ventricular tachycardia     nonsustained, asymptomatic  . Anemia   . Hypoxemia   . GERD (gastroesophageal reflux disease)     takes tums prn   Past Surgical History  Procedure Laterality Date  . Tonsillectomy    . Knee bursectomy Right 04/20/2013    Procedure: Right knee prepatella bursa decompression;  Surgeon: Cammy Copa, MD;  Location: [facility] OR;  Service: Orthopedics;  Laterality: Right;   Family History  Problem Relation Age of Onset  . COPD Mother   . Hypertension Father    History  Substance Use Topics  . Smoking status: Former Smoker    Start date: 06/29/1987  . Smokeless tobacco: Never Used     Comment: stopped in 1985  . Alcohol Use: 6.0 oz/week    10 Cans of beer per week    Review of Systems  Constitutional: Positive for activity change, appetite change and fatigue. Negative for fever.  HENT: Negative for congestion and  rhinorrhea.   Eyes: Negative for visual disturbance.  Respiratory: Negative for cough, chest tightness and shortness of breath.   Cardiovascular: Negative for chest pain.  Gastrointestinal: Negative for nausea, vomiting and abdominal pain.  Genitourinary: Negative for dysuria and hematuria.  Skin: Positive for wound.  Neurological: Positive for weakness. Negative for dizziness, light-headedness and headaches.  A complete 10 system review of systems was obtained and all systems are negative except as noted in the HPI and PMH.    Allergies  Review of patient's allergies indicates no known allergies.  Home Medications   Current Outpatient Rx  Name  Route  Sig  Dispense  Refill  . amoxicillin-clavulanate (AUGMENTIN) 875-125 MG per tablet   Oral   Take 1 tablet by mouth 2 (two) times daily. Take x14 days. Began regimen on 04/18/13.         . carvedilol (COREG) 6.25 MG tablet   Oral   Take 6.25 mg by mouth 2 (two) times daily with a meal.         . HYDROcodone-acetaminophen (NORCO/VICODIN) 5-325 MG per tablet   Oral   Take 1-2 tablets by mouth every 4 (four) hours as needed. For pain         . pravastatin (PRAVACHOL) 10 MG tablet   Oral   Take 10 mg by mouth daily.          BP  125/59  Pulse 84  Temp(Src) 102.7 F (39.3 C) (Other (Comment))  Resp 27  Ht 6' (1.829 m)  SpO2 100% Physical Exam  Constitutional: He is oriented to person, place, and time. He appears well-developed and well-nourished. No distress.  disheveled  HENT:  Head: Normocephalic and atraumatic.  Mouth/Throat: Oropharynx is clear and moist. No oropharyngeal exudate.  Eyes: Conjunctivae and EOM are normal. Pupils are equal, round, and reactive to light.  Neck: Normal range of motion. Neck supple.  Cardiovascular: Normal rate, regular rhythm and normal heart sounds.   No murmur heard. Pulmonary/Chest: Effort normal and breath sounds normal. No respiratory distress.  Abdominal: Soft. There is no  tenderness. There is no rebound and no guarding.  obese  Genitourinary:  Erythematous candidal rash to groin and pannus with significant erythema and desquamation. No induration or fluctuance. Asymmetric swelling of testicles, L>R, nontender  Musculoskeletal: Normal range of motion. He exhibits tenderness. He exhibits no edema.  RLE wrapped. Foot warm and well perfused. +2 DP pulse Dressing taken down.  No evidence of cellulitis or post op infection. Black eschar areas to patella similar to previous description.  Neurological: He is alert and oriented to person, place, and time. No cranial nerve deficit. Coordination normal.  Skin: Skin is warm. Rash noted.    ED Course  Procedures (including critical care time) Labs Reviewed  CBC WITH DIFFERENTIAL - Abnormal; Notable for the following:    WBC 12.1 (*)    RBC 3.12 (*)    Hemoglobin 9.9 (*)    HCT 31.0 (*)    Neutrophils Relative % 88 (*)    Neutro Abs 10.7 (*)    Lymphocytes Relative 6 (*)    All other components within normal limits  COMPREHENSIVE METABOLIC PANEL - Abnormal; Notable for the following:    Glucose, Bld 112 (*)    Albumin 2.5 (*)    GFR calc non Af Amer 90 (*)    All other components within normal limits  PROTIME-INR - Abnormal; Notable for the following:    Prothrombin Time 16.4 (*)    All other components within normal limits  URINALYSIS, ROUTINE W REFLEX MICROSCOPIC - Abnormal; Notable for the following:    Color, Urine AMBER (*)    APPearance CLOUDY (*)    All other components within normal limits  PRO B NATRIURETIC PEPTIDE - Abnormal; Notable for the following:    Pro B Natriuretic peptide (BNP) 6340.0 (*)    All other components within normal limits  BLOOD GAS, ARTERIAL - Abnormal; Notable for the following:    pO2, Arterial 214.0 (*)    Bicarbonate 25.9 (*)    Acid-Base Excess 2.1 (*)    All other components within normal limits  CULTURE, BLOOD (ROUTINE X 2)  CULTURE, BLOOD (ROUTINE X 2)  TROPONIN  I  LACTIC ACID, PLASMA   Ct Pelvis W Contrast  04/22/2013   *RADIOLOGY REPORT*  Clinical Data:  Bilateral scrotal loculated fluid collections. Clinical concern for gangrene.  CT PELVIS WITH CONTRAST  Technique:  Multidetector CT imaging of the pelvis was performed using the standard protocol following the bolus administration of intravenous contrast.  Contrast: OMNIPAQUE IOHEXOL 300 MG/ML  SOLN  Comparison:  Scrotal ultrasound obtained earlier today.  Findings:  Previously noted bilateral scrotal fluid collections. No soft tissue gas or abnormal enhancement in the scrotum or pelvis.  Foley catheter in the urinary bladder with associated air in the bladder.  No enlarged lymph nodes.  Lower lumbar spine  degenerative changes.  IMPRESSION: Previously noted bilateral scrotal fluid collections.  No soft tissue gas.   Original Report Authenticated By: Beckie Salts, M.D.   US Scrotum  04/22/2013   *RADIOLOGY REPORT*  Clinical Data:  Large scrotal swelling.  SCROTAL ULTRASOUND DOPPLER ULTRASOUND OF THE TESTICLES  Technique: Complete ultrasound examination of the testicles, epididymis, and other scrotal structures was performed.  Color and spectral Doppler ultrasound were also utilized to evaluate blood flow to the testicles.  Comparison:  None.  Findings:  Right testis:  Normal, measuring 3.9 x 2.9 x 1.8 cm  Left testis:  Normal, measuring 3.7 x 2.5 x 1.6 cm  Right epididymis:  Four large rounded fluid collections in the expected position of the head of the epididymis.  These contain low- level internal echoes.  The largest measures 4.3 cm in maximum diameter.  Additional smaller amount of fluid surrounding the right testicle and moderate amount of fluid containing more internal echoes inferior to the testicle.  The right epididymis is not visualized separate from these fluid collections.  Left epididymis:  Very large loculated fluid collection containing multiple thin and mildly thickened internal septations  lateral and posterior to the left testicle, displacing the testicle medially and anteriorly.  This measures approximately 14.7 x 9.0 x 5.5 cm. The left epididymis is not visualized separate from this fluid collection.  Hydrocele:  Small right  Varicocele:  None.  Pulsed Doppler interrogation of both testes demonstrates low resistance flow bilaterally.  IMPRESSION: Large, loculated and septated fluid collections bilaterally, as described above.  Neither epididymis is visualized separate from these fluid collections.  These could be due to chronic or previous epididymitis with reactive, loculated and septated fluid collections.  These could also represent unusual loculated hydroceles.   Original Report Authenticated By: Beckie Salts, M.D.   [place] Art/ven Flow Abd Pelv Doppler Limited  04/22/2013   *RADIOLOGY REPORT*  Clinical Data:  Large scrotal swelling.  SCROTAL ULTRASOUND DOPPLER ULTRASOUND OF THE TESTICLES  Technique: Complete ultrasound examination of the testicles, epididymis, and other scrotal structures was performed.  Color and spectral Doppler ultrasound were also utilized to evaluate blood flow to the testicles.  Comparison:  None.  Findings:  Right testis:  Normal, measuring 3.9 x 2.9 x 1.8 cm  Left testis:  Normal, measuring 3.7 x 2.5 x 1.6 cm  Right epididymis:  Four large rounded fluid collections in the expected position of the head of the epididymis.  These contain low- level internal echoes.  The largest measures 4.3 cm in maximum diameter.  Additional smaller amount of fluid surrounding the right testicle and moderate amount of fluid containing more internal echoes inferior to the testicle.  The right epididymis is not visualized separate from these fluid collections.  Left epididymis:  Very large loculated fluid collection containing multiple thin and mildly thickened internal septations lateral and posterior to the left testicle, displacing the testicle medially and anteriorly.  This measures  approximately 14.7 x 9.0 x 5.5 cm. The left epididymis is not visualized separate from this fluid collection.  Hydrocele:  Small right  Varicocele:  None.  Pulsed Doppler interrogation of both testes demonstrates low resistance flow bilaterally.  IMPRESSION: Large, loculated and septated fluid collections bilaterally, as described above.  Neither epididymis is visualized separate from these fluid collections.  These could be due to chronic or previous epididymitis with reactive, loculated and septated fluid collections.  These could also represent unusual loculated hydroceles.   Original Report Authenticated By: Beckie Salts, M.D.  Dg Chest Port 1 View  04/22/2013   *RADIOLOGY REPORT*  Clinical Data: Weakness.  Cardiomyopathy appear  PORTABLE CHEST - 1 VIEW  Comparison: And 12/25/2012.  Findings: Cardiomegaly.  Pulmonary vascular congestion/mild pulmonary edema.  No gross pneumothorax or segmental infiltrate.  Calcified aortic knob.  IMPRESSION: Cardiomegaly.  Pulmonary vascular congestion/mild pulmonary edema.   Original Report Authenticated By: Lacy Duverney, M.D.   1. CHF exacerbation   2. Cellulitis     MDM  Patient arrives from home with weakness and not eating for the past 2 days after having orthopedic surgery. He arrives hypoxic at 89% and tachypneic in the 30s.  Chest x-ray shows vascular congestion consistent with CHF. Patient is placed on BiPAP and given IV Lasix with improvement in hypoxia and work of breathing. No CO2 retention on ABG. Hemoglobin stable.  Erythematous groin rash with probable cellulitis. Concern for possible Fornier's gangrene.  Patient febrile to 102. Broad spectrum antibiotics are started. Obtain CT scan to evaluate for abscess or gas collection. Discussed with Dr. Lendell Caprice who agrees.  Patient able to be weaned off BiPAP after Lasix. He is stable on nasal cannula. No CO2 retention ABG. CT scan is not show any evidence of abscess or gas collection. Cystic structures  in the bilateral testicles without evidence of torsion. Does not appear to have Fornier's at this time but likely fungal infection with bacterial superinfection. With fever, concern for early sepsis. Blood cultures obtained, antibiotics given.   Date: 04/22/2013  Rate: 77  Rhythm: atrial fibrillation  QRS Axis: normal  Intervals: normal  ST/T Wave abnormalities: nonspecific ST/T changes  Conduction Disutrbances:none  Narrative Interpretation: artifact  Old EKG Reviewed: changes noted  CRITICAL CARE Performed by: Glynn Octave Total critical care time: 30 Critical care time was exclusive of separately billable procedures and treating other patients. Critical care was necessary to treat or prevent imminent or life-threatening deterioration. Critical care was time spent personally by me on the following activities: development of treatment plan with patient and/or surrogate as well as nursing, discussions with consultants, evaluation of patient's response to treatment, examination of patient, obtaining history from patient or surrogate, ordering and performing treatments and interventions, ordering and review of laboratory studies, ordering and review of radiographic studies, pulse oximetry and re-evaluation of patient's condition.   Glynn Octave, MD 04/23/13 [phone]

## 2013-04-22 NOTE — ED Notes (Signed)
Spoke with [name] regarding an In and Out for pt.  [name] said he would look into it.

## 2013-04-22 NOTE — ED Notes (Signed)
Pt returned from CT - called respiratory to hook pt back up to bipap and was told that DR had requested that pt NOT be put back on bipap at this time.

## 2013-04-23 ENCOUNTER — Inpatient Hospital Stay (HOSPITAL_COMMUNITY): Payer: Medicare HMO

## 2013-04-23 DIAGNOSIS — D72829 Elevated white blood cell count, unspecified: Secondary | ICD-10-CM | POA: Diagnosis present

## 2013-04-23 DIAGNOSIS — L0390 Cellulitis, unspecified: Secondary | ICD-10-CM | POA: Diagnosis present

## 2013-04-23 DIAGNOSIS — J9601 Acute respiratory failure with hypoxia: Secondary | ICD-10-CM | POA: Diagnosis present

## 2013-04-23 DIAGNOSIS — B372 Candidiasis of skin and nail: Secondary | ICD-10-CM | POA: Diagnosis present

## 2013-04-23 DIAGNOSIS — I059 Rheumatic mitral valve disease, unspecified: Secondary | ICD-10-CM

## 2013-04-23 DIAGNOSIS — R509 Fever, unspecified: Secondary | ICD-10-CM | POA: Diagnosis present

## 2013-04-23 LAB — CBC WITH DIFFERENTIAL/PLATELET
Hemoglobin: 9.3 g/dL — ABNORMAL LOW (ref 13.0–17.0)
Lymphocytes Relative: 6 % — ABNORMAL LOW (ref 12–46)
Lymphs Abs: 0.8 10*3/uL (ref 0.7–4.0)
Monocytes Relative: 6 % (ref 3–12)
Neutrophils Relative %: 87 % — ABNORMAL HIGH (ref 43–77)
Platelets: 331 10*3/uL (ref 150–400)
RBC: 2.9 MIL/uL — ABNORMAL LOW (ref 4.22–5.81)
WBC: 12.9 10*3/uL — ABNORMAL HIGH (ref 4.0–10.5)

## 2013-04-23 LAB — IRON AND TIBC
Iron: <10 ug/dL — ABNORMAL LOW (ref 42–135)
UIBC: 140 ug/dL (ref 125–400)

## 2013-04-23 LAB — BASIC METABOLIC PANEL
BUN: 11 mg/dL (ref 6–23)
CO2: 27 mEq/L (ref 19–32)
GFR calc non Af Amer: 89 mL/min — ABNORMAL LOW (ref >90)
Glucose, Bld: 116 mg/dL — ABNORMAL HIGH (ref 70–99)
Potassium: 4.2 mEq/L (ref 3.5–5.1)

## 2013-04-23 LAB — FERRITIN: Ferritin: 420 ng/mL — ABNORMAL HIGH (ref 22–322)

## 2013-04-23 LAB — CREATININE, SERUM: Creatinine, Ser: 0.83 mg/dL (ref 0.50–1.35)

## 2013-04-23 MED ORDER — ONDANSETRON HCL 4 MG PO TABS: 4.0000 mg | ORAL_TABLET | Freq: Four times a day (QID) | ORAL | Status: DC | PRN

## 2013-04-23 MED ORDER — FUROSEMIDE 10 MG/ML IJ SOLN
40.0000 mg | Freq: Two times a day (BID) | INTRAMUSCULAR | Status: DC
  Administered 2013-04-23 – 2013-04-25 (×5): 40 mg via INTRAVENOUS
  Filled 2013-04-23 (×5): qty 4

## 2013-04-23 MED ORDER — PIPERACILLIN-TAZOBACTAM 3.375 G IVPB
3.3750 g | Freq: Three times a day (TID) | INTRAVENOUS | Status: DC
  Administered 2013-04-23 – 2013-04-25 (×8): 3.375 g via INTRAVENOUS
  Filled 2013-04-23 (×10): qty 50

## 2013-04-23 MED ORDER — ALBUTEROL SULFATE (5 MG/ML) 0.5% IN NEBU: 2.5000 mg | INHALATION_SOLUTION | RESPIRATORY_TRACT | Status: DC | PRN

## 2013-04-23 MED ORDER — ACETAMINOPHEN 325 MG PO TABS
650.0000 mg | ORAL_TABLET | Freq: Four times a day (QID) | ORAL | Status: DC | PRN
  Administered 2013-04-23 – 2013-04-24 (×3): 650 mg via ORAL
  Filled 2013-04-23: qty 2
  Filled 2013-04-23: qty 1
  Filled 2013-04-23: qty 2
  Filled 2013-04-23: qty 1

## 2013-04-23 MED ORDER — FLUCONAZOLE IN SODIUM CHLORIDE 400-0.9 MG/200ML-% IV SOLN
400.0000 mg | Freq: Every day | INTRAVENOUS | Status: DC
  Administered 2013-04-23 – 2013-04-24 (×2): 400 mg via INTRAVENOUS
  Filled 2013-04-23 (×4): qty 200

## 2013-04-23 MED ORDER — HEPARIN SODIUM (PORCINE) 5000 UNIT/ML IJ SOLN
5000.0000 [IU] | Freq: Three times a day (TID) | INTRAMUSCULAR | Status: DC
  Administered 2013-04-23 – 2013-04-25 (×8): 5000 [IU] via SUBCUTANEOUS
  Filled 2013-04-23 (×10): qty 1

## 2013-04-23 MED ORDER — VITAMIN B-1 100 MG PO TABS
100.0000 mg | ORAL_TABLET | Freq: Every day | ORAL | Status: DC
  Administered 2013-04-23 – 2013-04-25 (×3): 100 mg via ORAL
  Filled 2013-04-23 (×3): qty 1

## 2013-04-23 MED ORDER — FUROSEMIDE 10 MG/ML IJ SOLN
40.0000 mg | Freq: Two times a day (BID) | INTRAMUSCULAR | Status: DC
  Filled 2013-04-23: qty 4

## 2013-04-23 MED ORDER — SIMVASTATIN 5 MG PO TABS
5.0000 mg | ORAL_TABLET | Freq: Every day | ORAL | Status: DC
  Administered 2013-04-23 – 2013-04-24 (×2): 5 mg via ORAL
  Filled 2013-04-23 (×3): qty 1

## 2013-04-23 MED ORDER — NYSTATIN 100000 UNIT/GM EX POWD
Freq: Three times a day (TID) | CUTANEOUS | Status: DC
  Administered 2013-04-23 – 2013-04-25 (×8): via TOPICAL
  Filled 2013-04-23 (×2): qty 15

## 2013-04-23 MED ORDER — ACETAMINOPHEN 650 MG RE SUPP
650.0000 mg | Freq: Four times a day (QID) | RECTAL | Status: DC | PRN
  Filled 2013-04-23: qty 1

## 2013-04-23 MED ORDER — LISINOPRIL 2.5 MG PO TABS
2.5000 mg | ORAL_TABLET | Freq: Every day | ORAL | Status: DC
  Filled 2013-04-23: qty 1

## 2013-04-23 MED ORDER — CARVEDILOL 6.25 MG PO TABS
6.2500 mg | ORAL_TABLET | Freq: Two times a day (BID) | ORAL | Status: DC
  Administered 2013-04-23 – 2013-04-25 (×5): 6.25 mg via ORAL
  Filled 2013-04-23 (×7): qty 1

## 2013-04-23 MED ORDER — HYDROCODONE-ACETAMINOPHEN 5-325 MG PO TABS
1.0000 | ORAL_TABLET | ORAL | Status: DC | PRN
  Administered 2013-04-23 (×2): 2 via ORAL
  Filled 2013-04-23 (×2): qty 2

## 2013-04-23 MED ORDER — ONDANSETRON HCL 4 MG/2ML IJ SOLN: 4.0000 mg | Freq: Four times a day (QID) | INTRAMUSCULAR | Status: DC | PRN

## 2013-04-23 MED ORDER — SODIUM CHLORIDE 0.9 % IV SOLN
1250.0000 mg | Freq: Two times a day (BID) | INTRAVENOUS | Status: DC
  Administered 2013-04-23 – 2013-04-25 (×4): 1250 mg via INTRAVENOUS
  Filled 2013-04-23 (×6): qty 1250

## 2013-04-23 MED ORDER — VANCOMYCIN HCL IN DEXTROSE 1-5 GM/200ML-% IV SOLN
1000.0000 mg | Freq: Two times a day (BID) | INTRAVENOUS | Status: DC
  Administered 2013-04-23: 1000 mg via INTRAVENOUS
  Filled 2013-04-23: qty 200

## 2013-04-23 MED ORDER — FLUCONAZOLE IN SODIUM CHLORIDE 200-0.9 MG/100ML-% IV SOLN
200.0000 mg | Freq: Every day | INTRAVENOUS | Status: DC
  Administered 2013-04-23: 200 mg via INTRAVENOUS
  Filled 2013-04-23: qty 100

## 2013-04-23 MED ORDER — SODIUM CHLORIDE 0.9 % IJ SOLN
3.0000 mL | Freq: Two times a day (BID) | INTRAMUSCULAR | Status: DC
  Administered 2013-04-23 – 2013-04-24 (×2): 3 mL via INTRAVENOUS

## 2013-04-23 MED ORDER — SENNOSIDES-DOCUSATE SODIUM 8.6-50 MG PO TABS
1.0000 | ORAL_TABLET | Freq: Every evening | ORAL | Status: DC | PRN
  Filled 2013-04-23: qty 1

## 2013-04-23 NOTE — Progress Notes (Signed)
F/U BP manual 100/40 and  automatic (with cuff size changed) 110/40. Awakens to command HR 55-65 afib, with low at 37 in afib.  [facility] aware.  Cont to monitor.

## 2013-04-23 NOTE — Progress Notes (Addendum)
[facility] PROGRESS NOTE  [name] ZOX:[id number] DOB: 1946/04/07 DOA: 04/22/2013 PCP: [name], MD  Brief narrative: 67 y.o. male with multiple comorbidities including but not limited to right knee hematoma, hypertension, morbid obesity, atrial fibrillation (coumadin stopped at the time of previous admission due to hematoma and decision was going to be made by PCP whether it is safe to resume it but as of now he is not on anticoagulation). Patient was recently admitted for right lower extremity cellulitis and was subsequently discharged to SNF. His family reported that th patient has become increasingly weak. The transfer from SNF for this admission was due to lethargy, hypoxia and tachypnea. In ED, vital signs were as follows: BP 89/33, HR 55 and Tmax of 102.7 F. O2 saturation was 86% on room air. His right knee was significant for eschars and hematomas. Patient also had significant cellulitis/pannus extending throughout the lower abdomen. In addition, testicles were swollen. Testicular Ultrasound showed chronic hydroceles or previous epididymitis but no acute issues identified. CT abdomen showed previously noted bilateral scrotal fluid collection. CXR was significant for pulmonary vascular congesion, mild pulmonary edema.  Assessment/Plan:  Principal Problem:   Fever, possible early sepsis - secondary to possible sepsis, procalcitonin elevated at 0.8 - continue broad spectrum antibiotics, vanco, zosyn and fluconazole - pt tolerates PO intake - IV fluids given in ED; caution with use of IV fluids due to CHF   Cellulitis, abdomen, Candidal intertrigo with probable bacterial superinfection - continue vanco and zosyn - increase fluconazole to 400 mg IV daily - add nystatin powder - wound care consult appreciated    Cellulitis of right leg - in addition to eschars identified on right lower extremity] - appreciate wound care consult - continue vancomycin for  cellulitis  Active Problems:    Acute respiratory failure with hypoxia - secondary to acute systolic and diastolic CHF exacerbation - respiratory status is stable at this time, saturating above 90% on nasal canula   Acute on chronic systolic and diastolic CHF (congestive heart failure) - BNP on admission 6340 - 2 D ECHO on this admission shows EF of 55% - continue lasix 40 mg IV BID - continue coreg 6.25 mg PO BID   Obesity, unspecified - nutrition consulted   HYPERTENSION - continue coreg   Right shoulder and wrist pain - no acute fractures identified on x rays   Anemia - likely of chronic disease - continue thiamine - hemoglobin stable at 9.3 and no indications for transfusion   Leukocytosis, unspecified - secondary to sepsis, fungal infection and cellulitis   Dyslipidemia - continue simvastatin   Code Status: full code Family Communication: no family at the bedside Disposition Plan: needs PT evaluation prior to discharge   Manson Passey, MD  [facility] Pager [phone]  If 7PM-7AM, please contact night-coverage [email or web address] Password [facility] 04/23/2013, 7:37 AM   LOS: 1 day   Consultants:  Wound care  Procedures:  None   Antibiotics:  Vancomycin 04/22/2013 -->  Zosyn 04/22/2013 -->  Fluconazole 04/22/2013 -->  HPI/Subjective: Pain in right wrist and shoulder.  Objective: Filed Vitals:   04/23/13 0500 04/23/13 0600 04/23/13 0615 04/23/13 0620  BP:  89/33 100/40 110/40  Pulse: 79 74 55 59  Temp: 100.8 F (38.2 C) 100.8 F (38.2 C) 100.8 F (38.2 C) 100.6 F (38.1 C)  TempSrc:  Core (Comment)    Resp: 26 25 24 23   Height:      Weight:      SpO2: 100% 95% 97% 96%  Intake/Output Summary (Last 24 hours) at 04/23/13 0737 Last data filed at 04/23/13 3244  Gross per 24 hour  Intake    610 ml  Output   2650 ml  Net  -2040 ml    Exam:   General:  Pt is alert, follows commands appropriately, not in acute distress  Cardiovascular: Regular rate and  rhythm, S1/S2, no murmurs, no rubs, no gallops  Respiratory: Clear to auscultation bilaterally, no wheezing, no crackles, no rhonchi  Abdomen: Soft, non tender, non distended, bowel sounds present, no guarding; area of extensive cellulitis and possible yeast infection superimposed on cellulitis   Extremities: LE +1 pitting edema, pulses DP and PT palpable bilaterally; hematoma/ eschar right knee  Neuro: Grossly nonfocal  Data Reviewed: Basic Metabolic Panel:  Recent Labs Lab 04/22/13 1954 04/23/13 0342  NA 142 141  K 4.4 4.2  CL 107 106  CO2 28 27  GLUCOSE 112* 116*  BUN 11 11  CREATININE 0.83 0.85  CALCIUM 9.1 8.6   Liver Function Tests:  Recent Labs Lab 04/22/13 1954  AST 23  ALT 19  ALKPHOS 64  BILITOT 0.5  PROT 7.3  ALBUMIN 2.5*   No results found for this basename: LIPASE, AMYLASE,  in the last 168 hours No results found for this basename: AMMONIA,  in the last 168 hours CBC:  Recent Labs Lab 04/22/13 1954 04/23/13 0342  WBC 12.1* 12.9*  NEUTROABS 10.7* 11.2*  HGB 9.9* 9.3*  HCT 31.0* 28.5*  MCV 99.4 98.3  PLT 380 331   Cardiac Enzymes:  Recent Labs Lab 04/22/13 1954  TROPONINI <0.30   BNP: No components found with this basename: POCBNP,  CBG: No results found for this basename: GLUCAP,  in the last 168 hours  Recent Results (from the past 240 hour(s))  WOUND CULTURE     Status: None   Collection Time    04/20/13 12:44 PM      Result Value Range Status   Specimen Description WOUND KNEE RIGHT   Final   Special Requests NONE   Final   Gram Stain     Final   Value: FEW WBC PRESENT, PREDOMINANTLY PMN     NO SQUAMOUS EPITHELIAL CELLS SEEN     NO ORGANISMS SEEN   Culture NO GROWTH 2 DAYS   Final   Report Status 04/22/2013 FINAL   Final  ANAEROBIC CULTURE     Status: None   Collection Time    04/20/13 12:44 PM      Result Value Range Status   Specimen Description WOUND KNEE RIGHT   Final   Special Requests NONE   Final   Gram Stain      Final   Value: FEW WBC PRESENT, PREDOMINANTLY PMN     NO SQUAMOUS EPITHELIAL CELLS SEEN     NO ORGANISMS SEEN   Culture     Final   Value: NO ANAEROBES ISOLATED; CULTURE IN PROGRESS FOR 5 DAYS   Report Status PENDING   Incomplete  MRSA PCR SCREENING     Status: None   Collection Time    04/23/13  3:11 AM      Result Value Range Status   MRSA by PCR NEGATIVE  NEGATIVE Final     Studies: Ct Pelvis W Contrast 04/22/2013   *  IMPRESSION: Previously noted bilateral scrotal fluid collections.  No soft tissue gas.   Original Report Authenticated By: Beckie Salts, M.D.   US Scrotum 04/22/2013  IMPRESSION: Large, loculated and septated fluid  collections bilaterally.  Neither epididymis is visualized separate from these fluid collections.  These could be due to chronic or previous epididymitis with reactive, loculated and septated fluid collections.  These could also represent unusual loculated hydroceles.   Original Report Authenticated By: Beckie Salts, M.D.   [place] Art/ven Flow Abd Pelv Doppler Limited 04/22/2013    Four large rounded fluid collections in the expected position of the head of the epididymis.  These contain low- level internal echoes.  The largest measures 4.3 cm in maximum diameter.  Additional smaller amount of fluid surrounding the right testicle and moderate amount of fluid containing more internal echoes inferior to the testicle.  The right epididymis is not visualized separate from these fluid collections.  Left epididymis:  Very large loculated fluid collection containing multiple thin and mildly thickened internal septations lateral and posterior to the left testicle, displacing the testicle medially and anteriorly.  This measures approximately 14.7 x 9.0 x 5.5 cm. The left epididymis is not visualized separate from this fluid collection.  Hydrocele:  Small right  Varicocele:  None.  Pulsed Doppler interrogation of both testes demonstrates low resistance flow bilaterally.  IMPRESSION:  Large, loculated and septated fluid collections bilaterally, as described above.  Neither epididymis is visualized separate from these fluid collections.  These could be due to chronic or previous epididymitis with reactive, loculated and septated fluid collections.  These could also represent unusual loculated hydroceles.    Dg Chest Port 1 View 04/22/2013    IMPRESSION: Cardiomegaly.  Pulmonary vascular congestion/mild pulmonary edema.   Original Report Authenticated By: Lacy Duverney, M.D.    Scheduled Meds: . carvedilol  6.25 mg Oral BID WC  . fluconazole (DIFLUCAN)   400 mg Intravenous QHS  . furosemide  40 mg Intravenous BID  . heparin  5,000 Units Subcutaneous Q8H  . nystatin   Topical TID  . piperacillin-tazobactam   3.375 g Intravenous Q8H  . simvastatin  5 mg Oral q1800  . thiamine  100 mg Oral Daily  . vancomycin  1,000 mg Intravenous Q12H

## 2013-04-23 NOTE — Progress Notes (Signed)
ANTIBIOTIC CONSULT NOTE - FOLLOW UP  Pharmacy Consult for Vancomycin Indication: Cellulitis, r/o Sepsis  No Known Allergies  Patient Measurements: Height: 6' (182.9 cm) Weight: 285 lb (129.275 kg) IBW/kg (Calculated) : 77.6  Vital Signs: Temp: 100.6 F (38.1 C) (07/13 0620) Temp src: Core (Comment) (07/13 0600) BP: 110/40 mmHg (07/13 0620) Pulse Rate: 59 (07/13 0620) Intake/Output from previous day: 07/12 0701 - 07/13 0700 In: 610 [P.O.:240; IV Piggyback:350] Out: 2650 [Urine:2650] Intake/Output from this shift:    Labs:  Recent Labs  04/22/13 1954 04/23/13 0342 04/23/13 0522  WBC 12.1* 12.9*  --   HGB 9.9* 9.3*  --   PLT 380 331  --   CREATININE 0.83 0.85 0.83   Estimated Creatinine Clearance: 121.7 ml/min (by C-G formula based on Cr of 0.83). No results found for this basename: VANCOTROUGH, Leodis Binet, VANCORANDOM, GENTTROUGH, GENTPEAK, GENTRANDOM, TOBRATROUGH, TOBRAPEAK, TOBRARND, AMIKACINPEAK, AMIKACINTROU, AMIKACIN,  in the last 72 hours   Microbiology: Recent Results (from the past 720 hour(s))  BODY FLUID CULTURE     Status: None   Collection Time    04/12/13  5:13 PM      Result Value Range Status   Specimen Description SYNOVIAL   Final   Special Requests NONE   Final   Gram Stain     Final   Value: FEW WBC PRESENT, PREDOMINANTLY PMN     NO ORGANISMS SEEN   Culture NO GROWTH 3 DAYS   Final   Report Status 04/16/2013 FINAL   Final  WOUND CULTURE     Status: None   Collection Time    04/20/13 12:44 PM      Result Value Range Status   Specimen Description WOUND KNEE RIGHT   Final   Special Requests NONE   Final   Gram Stain     Final   Value: FEW WBC PRESENT, PREDOMINANTLY PMN     NO SQUAMOUS EPITHELIAL CELLS SEEN     NO ORGANISMS SEEN   Culture NO GROWTH 2 DAYS   Final   Report Status 04/22/2013 FINAL   Final  ANAEROBIC CULTURE     Status: None   Collection Time    04/20/13 12:44 PM      Result Value Range Status   Specimen Description WOUND  KNEE RIGHT   Final   Special Requests NONE   Final   Gram Stain     Final   Value: FEW WBC PRESENT, PREDOMINANTLY PMN     NO SQUAMOUS EPITHELIAL CELLS SEEN     NO ORGANISMS SEEN   Culture     Final   Value: NO ANAEROBES ISOLATED; CULTURE IN PROGRESS FOR 5 DAYS   Report Status PENDING   Incomplete  MRSA PCR SCREENING     Status: None   Collection Time    04/23/13  3:11 AM      Result Value Range Status   MRSA by PCR NEGATIVE  NEGATIVE Final   Comment:            The GeneXpert MRSA Assay (FDA     approved for NASAL specimens     only), is one component of a     comprehensive MRSA colonization     surveillance program. It is not     intended to diagnose MRSA     infection nor to guide or     monitor treatment for     MRSA infections.    Anti-infectives   Start     Dose/Rate  Route Frequency Ordered Stop   04/23/13 2200  fluconazole (DIFLUCAN) IVPB 400 mg     400 mg 100 mL/hr over 120 Minutes Intravenous Daily at bedtime 04/23/13 0728     04/23/13 0600  piperacillin-tazobactam (ZOSYN) IVPB 3.375 g     3.375 g 12.5 mL/hr over 240 Minutes Intravenous 3 times per day 04/23/13 0305     04/23/13 0600  vancomycin (VANCOCIN) IVPB 1000 mg/200 mL premix     1,000 mg 200 mL/hr over 60 Minutes Intravenous Every 12 hours 04/23/13 0315     04/23/13 0315  fluconazole (DIFLUCAN) IVPB 200 mg  Status:  Discontinued     200 mg 100 mL/hr over 60 Minutes Intravenous Daily at bedtime 04/23/13 0305 04/23/13 0728   04/22/13 2230  vancomycin (VANCOCIN) IVPB 1000 mg/200 mL premix     1,000 mg 200 mL/hr over 60 Minutes Intravenous  Once 04/22/13 2146 04/23/13 0022   04/22/13 2230  piperacillin-tazobactam (ZOSYN) IVPB 3.375 g     3.375 g 100 mL/hr over 30 Minutes Intravenous  Once 04/22/13 2155 04/23/13 0122   04/22/13 2200  piperacillin-tazobactam (ZOSYN) IVPB 3.375 g  Status:  Discontinued     3.375 g 12.5 mL/hr over 240 Minutes Intravenous  Once 04/22/13 2146 04/22/13 2155      Assessment: 67  y.o. male who was recently admitted and treated for right leg cellulitis and traumatic hematoma in the setting of supratherapeutic INR. Readmitted 7/12 for acute CHF exacerbation and possible sepsis with fever. Had been receiving Augmentin PTA for cellulitis, now changing to IV Vanc/Zosyn for sepsis and IV Fluconazole for presumed fungal infection of pannus, groin and perineum.   Goal of Therapy:  Vancomycin trough level 15-20 mcg/ml  Plan:  Initially ordered Vanc 1g q12h after 2g total load - based on weight of 130kg:  Increase Vanc to 1250mg  IV q12h per protocol Check trough at steady state  Zosyn and Fluconazole per MD Follow up renal function & cultures   Loralee Pacas, PharmD, BCPS Pager: [phone] 04/23/2013,9:44 AM

## 2013-04-23 NOTE — Progress Notes (Signed)
Pt has severe reddened all across panus, between legs and under scrotum. Area cleaned and fungal powder applied. MD aware.  Orders received for WOC consult.

## 2013-04-23 NOTE — Progress Notes (Signed)
ANTIBIOTIC CONSULT NOTE - INITIAL  Pharmacy Consult for Vancomycin Indication: rule out sepsis/cellulitis  No Known Allergies  Patient Measurements: Height: 6' (182.9 cm) Weight: 285 lb (129.275 kg) IBW/kg (Calculated) : 77.6 Adjusted Body Weight:   Vital Signs: Temp: 100.1 F (37.8 C) (07/13 0200) Temp src: Core (Comment) (07/13 0300) BP: 121/65 mmHg (07/13 0300) Pulse Rate: 74 (07/13 0200) Intake/Output from previous day: 07/12 0701 - 07/13 0700 In: -  Out: 1700 [Urine:1700] Intake/Output from this shift: Total I/O In: -  Out: 1700 [Urine:1700]  Labs:  Recent Labs  04/22/13 1954  WBC 12.1*  HGB 9.9*  PLT 380  CREATININE 0.83   Estimated Creatinine Clearance: 121.7 ml/min (by C-G formula based on Cr of 0.83). No results found for this basename: VANCOTROUGH, Leodis Binet, VANCORANDOM, GENTTROUGH, GENTPEAK, GENTRANDOM, TOBRATROUGH, TOBRAPEAK, TOBRARND, AMIKACINPEAK, AMIKACINTROU, AMIKACIN,  in the last 72 hours   Microbiology: Recent Results (from the past 720 hour(s))  BODY FLUID CULTURE     Status: None   Collection Time    04/12/13  5:13 PM      Result Value Range Status   Specimen Description SYNOVIAL   Final   Special Requests NONE   Final   Gram Stain     Final   Value: FEW WBC PRESENT, PREDOMINANTLY PMN     NO ORGANISMS SEEN   Culture NO GROWTH 3 DAYS   Final   Report Status 04/16/2013 FINAL   Final  WOUND CULTURE     Status: None   Collection Time    04/20/13 12:44 PM      Result Value Range Status   Specimen Description WOUND KNEE RIGHT   Final   Special Requests NONE   Final   Gram Stain     Final   Value: FEW WBC PRESENT, PREDOMINANTLY PMN     NO SQUAMOUS EPITHELIAL CELLS SEEN     NO ORGANISMS SEEN   Culture NO GROWTH 2 DAYS   Final   Report Status 04/22/2013 FINAL   Final  ANAEROBIC CULTURE     Status: None   Collection Time    04/20/13 12:44 PM      Result Value Range Status   Specimen Description WOUND KNEE RIGHT   Final   Special  Requests NONE   Final   Gram Stain     Final   Value: FEW WBC PRESENT, PREDOMINANTLY PMN     NO SQUAMOUS EPITHELIAL CELLS SEEN     NO ORGANISMS SEEN   Culture     Final   Value: NO ANAEROBES ISOLATED; CULTURE IN PROGRESS FOR 5 DAYS   Report Status PENDING   Incomplete    Medical History: Past Medical History  Diagnosis Date  . Cardiomyopathy     nonischemic  . CHF (congestive heart failure)     ejection fractio of 45% per echo in 2011 (previos EF 35-40% in 2003)  . Alcoholism   . Atrial fibrillation or flutter   . Hypertension   . Obesity   . Ventricular tachycardia     nonsustained, asymptomatic  . Anemia   . Hypoxemia   . GERD (gastroesophageal reflux disease)     takes tums prn    Medications:  Anti-infectives   Start     Dose/Rate Route Frequency Ordered Stop   04/23/13 0600  piperacillin-tazobactam (ZOSYN) IVPB 3.375 g     3.375 g 12.5 mL/hr over 240 Minutes Intravenous 3 times per day 04/23/13 0305     04/23/13 0600  vancomycin (VANCOCIN) IVPB 1000 mg/200 mL premix     1,000 mg 200 mL/hr over 60 Minutes Intravenous Every 12 hours 04/23/13 0315     04/23/13 0315  fluconazole (DIFLUCAN) IVPB 200 mg     200 mg 100 mL/hr over 60 Minutes Intravenous Daily at bedtime 04/23/13 0305     04/22/13 2230  vancomycin (VANCOCIN) IVPB 1000 mg/200 mL premix     1,000 mg 200 mL/hr over 60 Minutes Intravenous  Once 04/22/13 2146 04/23/13 0022   04/22/13 2230  piperacillin-tazobactam (ZOSYN) IVPB 3.375 g     3.375 g 100 mL/hr over 30 Minutes Intravenous  Once 04/22/13 2155 04/23/13 0122   04/22/13 2200  piperacillin-tazobactam (ZOSYN) IVPB 3.375 g  Status:  Discontinued     3.375 g 12.5 mL/hr over 240 Minutes Intravenous  Once 04/22/13 2146 04/22/13 2155     Assessment: Patient with cellulitis/sepsis.  First dose of antibiotics already given in ED.  Goal of Therapy:  Vancomycin trough level 15-20 mcg/ml  Plan:  Measure antibiotic drug levels at steady state Follow up  culture results Vancomycin 1gm iv q12hr  Aleene Davidson Crowford 04/23/2013,3:16 AM

## 2013-04-23 NOTE — Progress Notes (Addendum)
Called at 6:10 am - BP at 83/36.  Patient admitted with early sepsis and CHF exacerbation. Will not give fluids due to CHF (cxr shows pulm vasc congestion).  But will D/C lasix  And Lisinopril until the rounding doctor can assess the patient.  Patient with right arm pain.  Given vicodin before BP dropped.  Recheck of BP with manual cuff improved.  Will re-instate lasix for diuresis of CHF.

## 2013-04-23 NOTE — Consult Note (Addendum)
WOC consult Note Reason for Consult:Eschar covering two wounds on right patella.  Patient is s/p fall. Also of concern is fungal overgrowth at pannus and in perineal area. Wound type:traumatic tissue injury Pressure Ulcer POA: Yes Measurement:Two areas: proximal 5cm x 5.5cm (unable to determine depth), distal 5cm x 8cm (unable to determine depth) Wound ZOX:WRUE black eschar (stable) over right patella and extending distally and medially.  Periwound tissue is bruised and warm to the touch. Drainage (amount, consistency, odor) None Periwound:As described above. Dressing procedure/placement/frequency: I am reluctant to initiate an enzymatic debriding agent (Collagenase/Santyl) as when necrotic tissue is ultimately dissolved, we will be looking at depth to perhaps the patella. I will subsequently initiate a white petrolatum (Vaseline) gauze dressing topped by an ABD and kerlix and changed twice daily so that we can monitor stable eschar and surrounding tissue.  Suggest an ortho or a [facility] consult (he has seen Dr. Ivor Reining of [facility] in the recent past)  to determine if these areas should be debrided or treated conservatively. I will provide an antimicrobial textile product to treat the intertriginous dermatitis (yeast overgrowth with skin breakdown) in the groin, perineal and abdominal pannus areas. I will not follow.  Please re-consult if needed. Thanks, Ladona Mow, MSN, RN, [facility], CWOCN [phone])

## 2013-04-23 NOTE — Progress Notes (Signed)
Echo Lab  2D Echocardiogram completed.  Shamond Skelton L Yasuo Phimmasone, RDCS 04/23/2013 8:42 AM

## 2013-04-24 DIAGNOSIS — L02419 Cutaneous abscess of limb, unspecified: Secondary | ICD-10-CM

## 2013-04-24 DIAGNOSIS — I482 Chronic atrial fibrillation, unspecified: Secondary | ICD-10-CM | POA: Diagnosis present

## 2013-04-24 DIAGNOSIS — T148XXA Other injury of unspecified body region, initial encounter: Secondary | ICD-10-CM | POA: Diagnosis present

## 2013-04-24 LAB — CBC
MCH: 33.3 pg (ref 26.0–34.0)
MCHC: 34.1 g/dL (ref 30.0–36.0)
MCV: 97.8 fL (ref 78.0–100.0)
Platelets: 329 10*3/uL (ref 150–400)
RDW: 13.1 % (ref 11.5–15.5)
WBC: 14.3 10*3/uL — ABNORMAL HIGH (ref 4.0–10.5)

## 2013-04-24 LAB — BASIC METABOLIC PANEL
BUN: 14 mg/dL (ref 6–23)
Calcium: 8.8 mg/dL (ref 8.4–10.5)
Chloride: 102 mEq/L (ref 96–112)
Creatinine, Ser: 0.91 mg/dL (ref 0.50–1.35)
GFR calc Af Amer: >90 mL/min (ref >90)
GFR calc non Af Amer: 86 mL/min — ABNORMAL LOW (ref >90)

## 2013-04-24 MED ORDER — ACETAMINOPHEN 325 MG RE SUPP
325.0000 mg | Freq: Once | RECTAL | Status: AC
  Administered 2013-04-24: 325 mg via RECTAL
  Filled 2013-04-24: qty 1

## 2013-04-24 NOTE — Progress Notes (Signed)
Patient transferring to room 1432.  Report called to Victorino Dike, Charity fundraiser.  Patient will travel by bed.  Will continue to monitor.

## 2013-04-24 NOTE — Evaluation (Signed)
Physical Therapy Evaluation Patient Details Name: [name] MRN: [id number] DOB: 11/19/1945 Today's Date: 04/24/2013 Time: 9147-8295 PT Time Calculation (min): 21 min  PT Assessment / Plan / Recommendation History of Present Illness  [name] is a 67 y.o. male who was recently admitted and treated for right leg cellulitis and traumatic hematoma in the setting of supratherapeutic Coumadin. He also had renal failure at that time. His Coumadin was reversed, he was on antibiotics. Lasix and ACE inhibitor were stopped. He was given IV fluids. The hematoma was aspirated by Dr. August Saucer. Skilled nursing facility was recommended but patient refused. S/p I&D R prepatellar hematome as OP on 04/20/13 by Dr. August Saucer,. Pt admitted 04/22/13 with FTT, early sepsis, hypotension.  Clinical Impression  Pt is significantly debilitated  Now versus last admission 1 week ago. Pt has severe pain in R hand, L wrist and L foot. Pwill benefit from PT while in acute care. Recommend OT consult. Pt will need SNF.    PT Assessment  Patient needs continued PT services    Follow Up Recommendations  SNF    Does the patient have the potential to tolerate intense rehabilitation      Barriers to Discharge Decreased caregiver support      Equipment Recommendations  None recommended by PT    Recommendations for Other Services OT consult   Frequency Min 3X/week    Precautions / Restrictions Precautions Precautions: Fall Precaution Comments: R knee hematoma Required Braces or Orthoses: Knee Immobilizer - Right Knee Immobilizer - Right:  (pt had KI placed after I/D 04/20/13. No current orders.) Pt also has scrotal edema   Pertinent Vitals/Pain R hand 10 if touched.      Mobility  Bed Mobility Bed Mobility: Supine to Sit;Sitting - Scoot to [place] of Bed;Sit to Supine;Scooting to [facility] Supine to Sit: 1: +2 Total assist;HOB elevated Supine to Sit: Patient Percentage: 10% Sitting - Scoot to Edge of Bed: 1: +2 Total  assist Sitting - Scoot to Edge of Bed: Patient Percentage: 10% Sit to Supine: HOB flat;1: +2 Total assist Sit to Supine: Patient Percentage: 0% Scooting to HOB: 1: +2 Total assist Scooting to [facility]: Patient Percentage: 0% Details for Bed Mobility Assistance: pt unable to functionally use UE's due to pain in hands. maxi slide used to slide pt along edge of bed and up. Transfers Transfers: Sit to Stand;Stand to Sit Sit to Stand: 1: +2 Total assist;From bed;From elevated surface;With upper extremity assist Sit to Stand: Patient Percentage: 20% Stand to Sit: 1: +2 Total assist;To bed Stand to Sit: Patient Percentage: 10% Details for Transfer Assistance: Pt is unable to place hands on RW for standing. Attempted by raising bed and placing elbows on RW, pt unable to stay upright. Ambulation/Gait Ambulation/Gait Assistance: Not tested (comment)    Exercises     PT Diagnosis: Difficulty walking;Generalized weakness;Acute pain  PT Problem List: Decreased strength;Decreased range of motion;Decreased activity tolerance;Decreased balance;Decreased mobility;Decreased knowledge of use of DME;Decreased safety awareness PT Treatment Interventions: DME instruction;Gait training;Stair training;Functional mobility training;Therapeutic activities;Patient/family education;Therapeutic exercise     PT Goals(Current goals can be found in the care plan section) Acute Rehab PT Goals Patient Stated Goal: to get back up and walk. PT Goal Formulation: With patient Time For Goal Achievement: 05/08/13 Potential to Achieve Goals: Good  Visit Information  Last PT Received On: 04/24/13 Assistance Needed: +2 Reason Eval/Treat Not Completed: Pain limiting ability to participate History of Present Illness: [name] is a 67 y.o. male who  was recently admitted and treated for right leg cellulitis and traumatic hematoma in the setting of supratherapeutic Coumadin. He also had renal failure at that time. His Coumadin  was reversed, he was on antibiotics. Lasix and ACE inhibitor were stopped. He was given IV fluids. The hematoma was aspirated by Dr. August Saucer. Skilled nursing facility was recommended but patient refused. S/p I&D R prepatellar hematome as OP on 04/20/13 by Dr. August Saucer,. Pt admitted 04/22/13 with FTT, early sepsis, hypotension.       Prior Functioning  Home Living Family/patient expects to be discharged to:: Private residence Living Arrangements: Parent;Other relatives Available Help at Discharge: Family Type of Home: House Home Access: Stairs to enter [employer] of Steps: 1 Home Layout: One level Home Equipment: Walker - 2 wheels;Walker - 4 wheels;Cane - single point Prior Function Level of Independence: Needs assistance Gait / Transfers Assistance Needed: has needed assistance since DC on 04/14/13.    Cognition  Cognition Arousal/Alertness: Awake/alert Behavior During Therapy: WFL for tasks assessed/performed;Anxious (due to pinful R hand and wrist.) Overall Cognitive Status: Impaired/Different from baseline Area of Impairment: Orientation Orientation Level: Time    Extremity/Trunk Assessment Upper Extremity Assessment Upper Extremity Assessment: RUE deficits/detail;LUE deficits/detail RUE Deficits / Details: pt with noted reddened areas over 2nd MC,  radial wrist  5th MC, LUE Deficits / Details: grossly WFL but reports painful L wrist when touched. Lower Extremity Assessment Lower Extremity Assessment: RLE deficits/detail;LLE deficits/detail RLE Deficits / Details: R knee dressing, Knee flexed to 80 degress when sitting on edge of bed. PT was unaware of KI to be worn. pt able to lift leg from bed. LLE Deficits / Details: pt reports pain at 1st met head. able to lif leg from bed and to flex knee to 80 in sitting.   Balance Balance Balance Assessed: Yes Static Sitting Balance Static Sitting - Balance Support: No upper extremity supported;Feet supported Static Sitting - Level of  Assistance: 4: Min assist  End of Session PT - End of Session Activity Tolerance: Patient limited by pain;Patient limited by fatigue Patient left: in bed;with call bell/phone within reach;with bed alarm set Nurse Communication: Mobility status;Need for lift equipment  GP     Rada Hay 04/24/2013, 2:39 PM  Blanchard Kelch PT [phone]

## 2013-04-24 NOTE — Progress Notes (Addendum)
[facility] PROGRESS NOTE  [name] OZH:[id number] DOB: 21-Apr-1946 DOA: 04/22/2013 PCP: [name], MD  Brief narrative: 66 y.o. male with multiple comorbidities including but not limited to right knee hematoma, hypertension, morbid obesity, atrial fibrillation (coumadin stopped at the time of previous admission due to hematoma and decision was going to be made by PCP whether it is safe to resume it but as of now he is not on anticoagulation). Patient was recently admitted for right lower extremity cellulitis and was subsequently discharged to SNF. His family reported that th patient has become increasingly weak. The transfer from SNF for this admission was due to lethargy, hypoxia and tachypnea.  In ED, vital signs were as follows: BP 89/33, HR 55 and Tmax of 102.7 F. O2 saturation was 86% on room air. His right knee was significant for eschars and hematomas. Patient also had significant cellulitis/pannus extending throughout the lower abdomen. In addition, testicles were swollen. Testicular Ultrasound showed chronic hydroceles or previous epididymitis but no acute issues identified. CT abdomen showed previously noted bilateral scrotal fluid collection. CXR was significant for pulmonary vascular congesion, mild pulmonary edema.   Assessment/Plan:   Principal Problem:  Fever, possible early sepsis  - secondary to possible sepsis, procalcitonin elevated at 0.86  - continue broad spectrum antibiotics for now, vanco, zosyn and fluconazole  - Tmax in past 24 hours 102 F  Cellulitis, abdomen, Candidal intertrigo with probable bacterial superinfection  - continue vanco and zosyn  - continue fluconazole to 400 mg IV daily  - continue nystatin powder  - wound care consult appreciated - recommendation is for an antimicrobial textile product to treat the intertriginous dermatitis (yeast overgrowth with skin breakdown) in the groin, perineal and abdominal pannus areas.   Cellulitis of right  leg  - cellulitis in addition to eschars identified on right lower extremity]  - appreciate wound care consult and their recommendations:  2 areas: proximal 5cm x 5.5cm , distal 5cm x 8cm hard black eschar (stable) over right patella and extending distally and medially, no drainage. May use white petrolatum (Vaseline) gauze dressing topped by an ABD and kerlix and changed twice daily so that we can monitor stable eschar and surrounding tissue.  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 Please note that this has been discontinued even on prior hospitalization Anemia  - likely of chronic disease  - continue thiamine  - hemoglobin stable at 9.3 and no indications for transfusion  Leukocytosis, unspecified  - secondary to sepsis, fungal infection and cellulitis  Dyslipidemia  - continue simvastatin   Code Status: full code  Family  Communication: no family at the bedside  Disposition Plan: needs PT evaluation prior to discharge   Manson Passey, MD  [facility]  Pager [phone]   Consultants:  Wound care Orthopedic surgery (Dr. August Saucer) Procedures:  None  Antibiotics:   Vancomycin 04/22/2013 -->  Zosyn 04/22/2013 -->  Fluconazole 04/22/2013 -->  If 7PM-7AM, please contact night-coverage [email or web address] Password [email or web address] 04/24/2013, 2:53 PM   LOS: 2 days    HPI/Subjective: No acute overnight events.  Objective: Filed Vitals:   04/24/13 0800 04/24/13 1000 04/24/13 1200 04/24/13 1314  BP: 137/58 113/48 110/51 111/59  Pulse: 80 77 80 75  Temp: 100.9 F (38.3 C) 101.8 F (38.8 C) 102 F (38.9 C) 98.6 F (37 C)  TempSrc: Core (Comment)  Core (Comment) Oral  Resp: 25 25 28 21   Height:    6' (1.829 m)  Weight:    123.9 kg (273 lb 2.4 oz)  SpO2: 100% 98% 98% 94%    Intake/Output Summary (Last 24 hours) at 04/24/13 1453 Last data filed at 04/24/13 1200  Gross per 24 hour  Intake   1086 ml  Output   2270 ml  Net  -1184 ml    Exam:   General:  Pt is alert, follows commands appropriately, not in acute distress  Cardiovascular: Regular rate and rhythm, S1/S2 aprpeciated  Respiratory: Clear to auscultation bilaterally, no wheezing, no crackles, no rhonchi  Abdomen: Soft, non tender, non distended, bowel sounds present, no guarding  Extremities: LE edema, right knee hematoma, eschars on right lower extremity, pulses DP and PT palpable bilaterally  Neuro: Grossly nonfocal  Data Reviewed: Basic Metabolic Panel:  Recent Labs Lab 04/22/13 1954 04/23/13 0342 04/23/13 0522 04/24/13 0343  NA 142 141  --  143  K 4.4 4.2  --  3.7  CL 107 106  --  102  CO2 28 27  --  32  GLUCOSE 112* 116*  --  119*  BUN 11 11  --  14  CREATININE 0.83 0.85 0.83 0.91  CALCIUM 9.1 8.6  --  8.8   Liver Function Tests:  Recent Labs Lab 04/22/13 1954  AST 23  ALT 19  ALKPHOS 64  BILITOT 0.5  PROT 7.3  ALBUMIN 2.5*   No results found for this basename: LIPASE, AMYLASE,  in the last 168 hours No results found for this basename: AMMONIA,  in the last 168 hours CBC:  Recent Labs Lab 04/22/13 1954 04/23/13 0342 04/24/13 0343  WBC 12.1* 12.9* 14.3*   NEUTROABS 10.7* 11.2*  --   HGB 9.9* 9.3* 9.3*  HCT 31.0* 28.5* 27.3*  MCV 99.4 98.3 97.8  PLT 380 331 329   Cardiac Enzymes:  Recent Labs Lab 04/22/13 1954  TROPONINI <0.30    WOUND CULTURE     Status: None   Collection Time    04/20/13 12:44 PM      Result Value Range Status   Specimen Description WOUND KNEE RIGHT   Final   Special Requests NONE   Final   Gram Stain     Final   Value: FEW WBC PRESENT, PREDOMINANTLY PMN     NO SQUAMOUS EPITHELIAL CELLS SEEN     NO ORGANISMS SEEN   Culture NO GROWTH 2 DAYS   Final   Report Status 04/22/2013 FINAL   Final  ANAEROBIC CULTURE     Status: None   Collection Time    04/20/13 12:44 PM      Result Value Range Status   Specimen Description WOUND  KNEE RIGHT   Final   Special Requests NONE   Final   Gram Stain     Final   Value: FEW WBC PRESENT, PREDOMINANTLY PMN     NO SQUAMOUS EPITHELIAL CELLS SEEN     NO ORGANISMS SEEN   Culture     Final   Value: NO ANAEROBES ISOLATED; CULTURE IN PROGRESS FOR 5 DAYS   Report Status PENDING   Incomplete  CULTURE, BLOOD (ROUTINE X 2)     Status: None   Collection Time    04/22/13 10:01 PM      Result Value Range Status   Specimen Description BLOOD RIGHT FOREARM   Final   Special Requests BOTTLES DRAWN AEROBIC AND ANAEROBIC 3CC   Final   Culture  Setup Time 04/23/2013 03:24   Final   Culture     Final   Value:        BLOOD CULTURE RECEIVED NO GROWTH TO DATE    Report Status PENDING   Incomplete  CULTURE, BLOOD (ROUTINE X 2)     Status: None   Collection Time    04/22/13 10:07 PM      Result Value Range Status   Specimen Description BLOOD RIGHT HAND   Final   Special Requests BOTTLES DRAWN AEROBIC AND ANAEROBIC 4CC   Final   Culture  Setup Time 04/23/2013 03:24   Final   Culture     Final   Value:        BLOOD CULTURE RECEIVED NO GROWTH TO DATE    Report Status PENDING   Incomplete  MRSA PCR SCREENING     Status: None   Collection Time    04/23/13  3:11 AM      Result Value Range  Status   MRSA by PCR NEGATIVE  NEGATIVE Final     Studies: Dg Shoulder 1v Right 04/23/2013   IMPRESSION: No acute findings.   Original Report Authenticated By: Myles Rosenthal, M.D.   Dg Wrist Complete Right 04/23/2013    IMPRESSION: Negative for fracture.   Original Report Authenticated By: Janeece Riggers, M.D.   Ct Pelvis W Contrast 04/22/2013    IMPRESSION: Previously noted bilateral scrotal fluid collections.  No soft tissue gas.   Original Report Authenticated By: Beckie Salts, M.D.   US Scrotum 04/22/2013     IMPRESSION: Large, loculated and septated fluid collections bilaterally, as described above.  Neither epididymis is visualized separate from these fluid collections.  These could be due to chronic or previous epididymitis with reactive, loculated and septated fluid collections.  These could also represent unusual loculated hydroceles.   Original Report Authenticated By: Beckie Salts, M.D.   [place] Art/ven Flow Abd Pelv Doppler Limited 04/22/2013     IMPRESSION: Large, loculated and septated fluid collections bilaterally, as described above.  Neither epididymis is visualized separate from these fluid collections.  These could be due to chronic or previous epididymitis with reactive, loculated and septated fluid collections.  These could also represent unusual loculated hydroceles.   Original Report Authenticated By: Beckie Salts, M.D.   Dg Chest Port 1 View 04/22/2013   * IMPRESSION: Cardiomegaly.  Pulmonary vascular congestion/mild pulmonary edema.   Original Report Authenticated By: Lacy Duverney, M.D.    Scheduled Meds: . carvedilol  6.25 mg Oral BID WC  . fluconazole (DIFLUCAN)   400 mg Intravenous QHS  . furosemide  40 mg Intravenous BID  . heparin  5,000 Units Subcutaneous Q8H  . nystatin   Topical TID  .  piperacillin-tazobactam  3.375 g Intravenous Q8H  . simvastatin  5 mg Oral q1800  . thiamine  100 mg Oral Daily  . vancomycin  1,250 mg Intravenous Q12H

## 2013-04-25 LAB — CBC WITH DIFFERENTIAL/PLATELET
Basophils Absolute: 0 10*3/uL (ref 0.0–0.1)
Basophils Relative: 0 % (ref 0–1)
Eosinophils Relative: 1 % (ref 0–5)
HCT: 27.4 % — ABNORMAL LOW (ref 39.0–52.0)
Hemoglobin: 8.7 g/dL — ABNORMAL LOW (ref 13.0–17.0)
Lymphocytes Relative: 6 % — ABNORMAL LOW (ref 12–46)
MCHC: 31.8 g/dL (ref 30.0–36.0)
MCV: 98.6 fL (ref 78.0–100.0)
Monocytes Absolute: 0.8 10*3/uL (ref 0.1–1.0)
Monocytes Relative: 6 % (ref 3–12)
Neutro Abs: 10.5 10*3/uL — ABNORMAL HIGH (ref 1.7–7.7)
RDW: 13 % (ref 11.5–15.5)

## 2013-04-25 LAB — PROCALCITONIN: Procalcitonin: 0.32 ng/mL

## 2013-04-25 LAB — ANAEROBIC CULTURE

## 2013-04-25 MED ORDER — HYDROCODONE-ACETAMINOPHEN 5-325 MG PO TABS: 1.0000 | ORAL_TABLET | ORAL | Status: DC | PRN

## 2013-04-25 MED ORDER — POTASSIUM CHLORIDE ER 10 MEQ PO TBCR: 20.0000 meq | EXTENDED_RELEASE_TABLET | Freq: Two times a day (BID) | ORAL | Status: DC

## 2013-04-25 MED ORDER — FUROSEMIDE 40 MG PO TABS: 40.0000 mg | ORAL_TABLET | Freq: Every day | ORAL | Status: DC

## 2013-04-25 MED ORDER — ALBUTEROL SULFATE (5 MG/ML) 0.5% IN NEBU: 2.5000 mg | INHALATION_SOLUTION | RESPIRATORY_TRACT | Status: DC | PRN

## 2013-04-25 MED ORDER — SODIUM CHLORIDE 0.9 % IV BOLUS (SEPSIS)
250.0000 mL | Freq: Once | INTRAVENOUS | Status: AC
  Administered 2013-04-25: 250 mL via INTRAVENOUS

## 2013-04-25 MED ORDER — TRAMADOL HCL ER 100 MG PO TB24
100.0000 mg | ORAL_TABLET | Freq: Every day | ORAL | Status: DC
Start: 2013-04-25 — End: 2013-06-27

## 2013-04-25 MED ORDER — FLUCONAZOLE 100 MG PO TABS: 200.0000 mg | ORAL_TABLET | Freq: Every day | ORAL | Status: DC

## 2013-04-25 MED ORDER — DOXYCYCLINE HYCLATE 100 MG PO TABS: 100.0000 mg | ORAL_TABLET | Freq: Two times a day (BID) | ORAL | Status: DC

## 2013-04-25 MED ORDER — THIAMINE HCL 100 MG PO TABS: 100.0000 mg | ORAL_TABLET | Freq: Every day | ORAL | Status: DC

## 2013-04-25 MED ORDER — NYSTATIN 100000 UNIT/GM EX POWD: 60.0000 g | Freq: Three times a day (TID) | CUTANEOUS | Status: DC

## 2013-04-25 MED ORDER — SENNOSIDES-DOCUSATE SODIUM 8.6-50 MG PO TABS: 1.0000 | ORAL_TABLET | Freq: Every evening | ORAL | Status: DC | PRN

## 2013-04-25 NOTE — Progress Notes (Signed)
Patient is set to discharge to [facility] today. [employer] insurance authorization obtained. Patient & son, Gene aware. Discharge packet in [place]. [facility] scheduled for transport pickup (Service Request Id: [id number]).   Clinical Social Work Department CLINICAL SOCIAL WORK PLACEMENT NOTE 04/25/2013  Patient:  [name], [name]  Account Number:  [id number] Admit date:  04/22/2013  Clinical Social Worker:  Orpah Greek  Date/time:  04/25/2013 10:53 AM  Clinical Social Work is seeking post-discharge placement for this patient at the following level of care:   SKILLED NURSING   (*CSW will update this form in Epic as items are completed)   04/25/2013  Patient/family provided with [facility] Department of Clinical Social Work's list of facilities offering this level of care within the geographic area requested by the patient (or if unable, by the patient's family).  04/25/2013  Patient/family informed of their freedom to choose among providers that offer the needed level of care, that participate in Medicare, Medicaid or managed care program needed by the patient, have an available bed and are willing to accept the patient.  04/25/2013  Patient/family informed of [facility]' ownership interest in [facility], as well as of the fact that they are under no obligation to receive care at this facility.  PASARR submitted to EDS on 04/25/2013 PASARR number received from EDS on 04/25/2013  FL2 transmitted to all facilities in geographic area requested by pt/family on  04/25/2013 FL2 transmitted to all facilities within larger geographic area on   Patient informed that his/her managed care company has contracts with or will negotiate with  certain facilities, including the following:   [facility]     Patient/family informed of bed offers received:  04/25/2013 Patient chooses bed at [facility] AND [facility] Physician recommends and patient chooses bed at    Patient  to be transferred to [facility] on  04/25/2013 Patient to be transferred to facility by PTAR  The following physician request were entered in Epic:   Additional Comments:  Unice Bailey, LCSW [facility] Clinical Social Worker cell #: [phone]

## 2013-04-25 NOTE — Progress Notes (Signed)
Report received from A Yetta Flock RN, agree with am assessment.

## 2013-04-25 NOTE — Progress Notes (Signed)
ANTIBIOTIC CONSULT NOTE - FOLLOW UP  Pharmacy Consult for Vancomycin Indication: Cellulitis, r/o sepsis  No Known Allergies  Patient Measurements: Height: 6' (182.9 cm) Weight: 273 lb 5.9 oz (124 kg) IBW/kg (Calculated) : 77.6  Vital Signs: Temp: 98.6 F (37 C) (07/15 0615) Temp src: Oral (07/15 0615) BP: 122/62 mmHg (07/15 0615) Pulse Rate: 73 (07/15 0615) Intake/Output from previous day: 07/14 0701 - 07/15 0700 In: 1272.5 [P.O.:360; IV Piggyback:862.5] Out: 2400 [Urine:2400]  Labs:  Recent Labs  04/23/13 0342 04/23/13 0522 04/24/13 0343 04/25/13 0426  WBC 12.9*  --  14.3* 12.1*  HGB 9.3*  --  9.3* 8.7*  PLT 331  --  329 368  CREATININE 0.85 0.83 0.91  --    Estimated Creatinine Clearance: 108.7 ml/min (by C-G formula based on Cr of 0.91). No results found for this basename: Rolm Gala, VANCORANDOM, GENTTROUGH, GENTPEAK, GENTRANDOM, TOBRATROUGH, TOBRAPEAK, TOBRARND, AMIKACINPEAK, AMIKACINTROU, AMIKACIN,  in the last 72 hours   Microbiology: 7/10 wound: NGF 7/12 blood x2: NGTD  Anti-infectives: Augmentin PTA since 7/8 7/12 >> Vanc >> 7/12 >> Zosyn >>  7/13 >> Fluconazole >>   Assessment: 67 yo M admitted 7/12 for acute CHF exacerbation and possible sepsis with fever.  Recently admitted and treated for RLE cellulitis and traumatic hematoma w/ elevated INR.  PTA he was taking Augmentin but was changed to Vanc/Zosyn/Fluconazole for sepsis and RLE cellulitis, abdominal cellulitis with Candidal intertrigo and probable bacterial superinfection.  SCr remains stable, CrCl ~ 81 ml/min (normalized)  WBC 12.1  Cultures remain negative  VT before tonight's dose  Goal of Therapy:  Vancomycin trough level 15-20 mcg/ml  Plan:   Continue Vancomycin 1250mg  IV q12h.  Continue Zosyn and Fluconazole as ordered.  Measure Vanc trough at steady state.  Follow up renal fxn and culture results.   Lynann Beaver PharmD, BCPS Pager [phone] 04/25/2013  11:00 AM

## 2013-04-25 NOTE — Progress Notes (Signed)
Awaiting [name] insurance authorization - information submitted & confirmed with [name] liaison) that she has received information. Anticipating discharge today. Patient & brother, [name] (cell#: [phone]) aware.   Clinical Social Work Department CLINICAL SOCIAL WORK PLACEMENT NOTE 04/25/2013  Patient:  [name], [name]  Account Number:  [id number] Admit date:  04/22/2013  Clinical Social Worker:  Orpah Greek  Date/time:  04/25/2013 10:53 AM  Clinical Social Work is seeking post-discharge placement for this patient at the following level of care:   SKILLED NURSING   (*CSW will update this form in Epic as items are completed)   04/25/2013  Patient/family provided with [facility] Department of Clinical Social Work's list of facilities offering this level of care within the geographic area requested by the patient (or if unable, by the patient's family).  04/25/2013  Patient/family informed of their freedom to choose among providers that offer the needed level of care, that participate in Medicare, Medicaid or managed care program needed by the patient, have an available bed and are willing to accept the patient.  04/25/2013  Patient/family informed of [facility]' ownership interest in [facility], as well as of the fact that they are under no obligation to receive care at this facility.  PASARR submitted to EDS on 04/25/2013 PASARR number received from EDS on 04/25/2013  FL2 transmitted to all facilities in geographic area requested by pt/family on  04/25/2013 FL2 transmitted to all facilities within larger geographic area on   Patient informed that his/her managed care company has contracts with or will negotiate with  certain facilities, including the following:   [facility]     Patient/family informed of bed offers received:  04/25/2013 Patient chooses bed at [facility] AND [facility] Physician recommends and patient chooses bed at     Patient to be transferred to  on   Patient to be transferred to facility by   The following physician request were entered in Epic:   Additional Comments:   Richard Bailey, LCSW [facility] Clinical Social Worker cell #: [phone]

## 2013-04-25 NOTE — Progress Notes (Signed)
Report called to [facility] RN @ [facility].

## 2013-04-25 NOTE — Progress Notes (Signed)
Events noted RLE improved  -  eschar present Slight reaccumulation of fluid Compression dressing applied Plan obsrevation for now of right leg - on iv abx - closed space infxn unlikely - all surgical cxs neg

## 2013-04-25 NOTE — Progress Notes (Signed)
Pt brother (Gene) and father at bedside, states pt has had severe decline since falling in the kitchen on July 4th. He was at [facility] and dc'd home, has done worse, as he his baseline is "getting around, going outside, preparing meals, and self hygiene". They both agree pt needs rehab/SNF per PT consult. They would like SW to inquire at [facility] in [place].

## 2013-04-25 NOTE — Discharge Summary (Signed)
Physician Discharge Summary  [name]:[id number] DOB: [birth year] DOA: 04/22/2013  PCP: [name], MD  Admit date: 04/22/2013 Discharge date: 04/25/2013  Recommendations for Outpatient Follow-up:  1. please note the wound care instructions on wound care section  2. continue doxycycline for 2 weeks 100 mg twice daily for lower extremity cellulitis  3. continue fluconazole 200 mg daily for 2 weeks and discharged yeast infection, Candida intertrigo. Please use of nystatin powder over the abdominal wall infection  4. your white blood cell count has improved since the admission, 12.1 at the time of discharge  5. Your hemoglobin is stable at the time of discharge, 8.7. Besides right knee hematoma there is no other source of bleed. Right knee hematoma has been evaluated by orthopedic surgery and it is considered to be stable.  6. your kidney function is within normal limits throughout his hospitalization  7. because of shoulder pain and wrist pain on the right side we have obtained x-rays which did not reveal acute or chronic fractures  8. scrotal edema is chronic, recommend scrotal elevation. There were no acute findings on scrotal ultrasound.  9. your BNP on the admission was slightly above 6000 and you have been given Lasix 40 mg IV twice daily. We will continue Lasix on discharge, 40 mg daily. Monitor renal function at least once a week to make sure it is stable. As noted above renal function is within normal limits throughout the hospital stay.  10. Because Lasix can deplete potassium sources please and supplement with potassium 20 mEq daily.  11 continue Corag 6.25 mg twice daily  12. You may continue lipid-lowering agent, simvastatin 5 mg at bedtime  13. Continue physical therapy in skilled nursing facility as patient tolerates it  14. Please note that patient was on Coumadin in recent past for atrial fibrillation. This Coumadin has been stopped on prior hospitalization  due to knee hematoma. This was supposed to be reevaluated by primary care physician when hematoma resolves (meaning restarting the Coumadin outpatient based on primary care physician evaluation of hematoma resolution). Patient still has hematoma for which reason we did not reinitiate the Coumadin due to high risk of bleeding  15. I have made an appointment for you with your primary care physician in 05/08/2013 10:30 AM. I have informed her of your hospitalization and that you're going to skilled nursing facility but you can still follow with her as your primary care provider   Wound care instructions: - 2 areas: proximal 5cm x 5.5cm , distal 5cm x 8cm hard black eschar (stable) over right patella and extending distally and medially, no drainage. May use white petrolatum (Vaseline) gauze dressing topped by an ABD and kerlix and changed twice daily so that you can monitor stable eschar and surrounding tissue.  - Abdominal panus: recommendation is for an antimicrobial textile product to treat the intertriginous dermatitis (yeast overgrowth with skin breakdown) in the groin, perineal and abdominal pannus areas.  Discharge Diagnoses:  Principal Problem:   Cellulitis Active Problems:   Cellulitis of right leg   Candidal intertrigo with probable bacterial superinfection   Fever, possible early sepsis   Obesity, unspecified   HYPERTENSION   Traumatic hematoma of right lower leg   Acute on chronic systolic CHF (congestive heart failure)   Anemia   Acute respiratory failure with hypoxia   Leukocytosis, unspecified   Hematoma   Atrial fibrillation    Discharge Condition: Medically stable for discharge to skilled nursing facility today  Diet recommendation:  As tolerated  History of present illness:  66 y.o. male with multiple comorbidities including but not limited to right knee hematoma, hypertension, morbid obesity, atrial fibrillation (coumadin stopped at the time of previous admission due  to hematoma and decision was going to be made by PCP whether it is safe to resume it but as of now he is not on anticoagulation). Patient was recently admitted for right lower extremity cellulitis and was subsequently discharged to SNF. His family reported that th patient has become increasingly weak. The transfer from SNF for this admission was due to lethargy, hypoxia and tachypnea.  In ED, vital signs were as follows: BP 89/33, HR 55 and Tmax of 102.7 F. O2 saturation was 86% on room air. His right knee was significant for eschars and hematomas. Patient also had significant cellulitis/pannus extending throughout the lower abdomen. In addition, testicles were swollen. Testicular Ultrasound showed chronic hydroceles or previous epididymitis but no acute issues identified. CT abdomen showed previously noted bilateral scrotal fluid collection. CXR was significant for pulmonary vascular congesion, mild pulmonary edema.   Assessment/Plan:   Principal Problem:  Fever, possible early sepsis  - secondary to possible sepsis, procalcitonin elevated at 0.86  - Patient was on vancomycin, Zosyn and fluconazole. - Afebrile in past 24 hours Cellulitis, abdomen, Candidal intertrigo with probable bacterial superinfection  - We will switch to doxycycline 100 mg twice daily for 2 weeks on discharged for cellulitis - We will continue fluconazole 200 mg daily for 2 weeks and discharge for yeast infection, abdominal panus and Candida intertrigo - continue nystatin powder as prescribed - wound care consult appreciated - recommendation is for an antimicrobial textile product to treat the intertriginous dermatitis (yeast overgrowth with skin breakdown) in the groin, perineal and abdominal pannus areas.  Cellulitis of right leg  - cellulitis in addition to eschars identified on right lower extremity - Patient was seen by orthopedic surgery Dr. August Saucer and has recommended a compression dressing; hematoma stable but not  resolved - appreciate wound care consult and their recommendations: 2 areas: proximal 5cm x 5.5cm , distal 5cm x 8cm hard black eschar (stable) over right patella and extending distally and medially, no drainage. May use white petrolatum (Vaseline) gauze dressing topped by an ABD and kerlix and changed twice daily so that we can monitor stable eschar and surrounding tissue.  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 Supplement with potassium 20 mEq daily - continue coreg 6.25 mg PO BID  - Weight on admission 129 kg, today 123.9 kg  Obesity, unspecified  - nutrition consulted  HYPERTENSION  - Continue Corag 6.25 mg by mouth twice a day  Right shoulder and wrist pain  - no acute fractures identified on x rays  History of atrial fibrillation  - Not on anticoagulation, warfarin, due to right knee hematoma. Please note that this has been discontinued even on prior hospitalization  Anemia  - likely of chronic disease  - continue thiamine  - hemoglobin stable  Leukocytosis, unspecified  - secondary to sepsis, fungal infection and cellulitis  Dyslipidemia  -  continue simvastatin   Code Status: full code  Family Communication: no family at the bedside   Manson Passey, MD  [facility]  Pager [phone]   Consultants:  Wound care  Orthopedic surgery (Dr. August Saucer) Procedures:  None  Antibiotics:  Vancomycin 04/22/2013 --> 04/25/2013 Zosyn 04/22/2013 --> 04/25/2013 Fluconazole 04/22/2013 --> continue fluconazole 200 mg daily for 2 weeks on discharge Doxycycline 100 mg twice daily continue for 2 weeks on  discharge   Discharge Exam: Filed Vitals:   04/25/13 1350  BP: 122/60  Pulse: 77  Temp: 98.6 F (37 C)  Resp: 20   Filed Vitals:   04/25/13 0015 04/25/13 0500 04/25/13 0615 04/25/13 1350  BP:   122/62 122/60  Pulse:   73 77  Temp: 98.1 F (36.7 C)  98.6 F (37 C) 98.6 F (37 C)  TempSrc: Oral  Oral Oral  Resp:   20 20  Height:      Weight:  124 kg (273 lb 5.9 oz)    SpO2:   95% 95%    General: Pt is alert, follows commands appropriately, not in acute distress Cardiovascular: Irregular rhythm, rate controlled, S1/S2 appreciated Respiratory: Clear to auscultation bilaterally, no wheezing, no crackles, no rhonchi Abdominal: Soft, non tender, non distended, bowel sounds +, no guarding Extremities: Right knee eschar, hematoma, pulses palpable Neuro: Grossly nonfocal  Discharge Instructions  Discharge Orders   Future Orders Complete By Expires     Call MD for:  difficulty breathing, headache or visual disturbances  As directed     Call MD for:  persistant dizziness or light-headedness  As directed     Call MD for:  persistant nausea and vomiting  As directed     Call MD for:  severe uncontrolled pain  As directed     Diet - low sodium heart healthy  As directed     Diet - low sodium heart healthy  As directed     Discharge instructions  As directed     Comments:      1. please note the wound care instructions on wound care section 2. continue doxycycline for 2 weeks 100 mg twice daily for lower extremity cellulitis 3. continue fluconazole 200 mg daily for 2 weeks and discharged yeast infection, Candida intertrigo. Please use of nystatin powder over the abdominal wall infection 4. your white blood cell count has improved since the admission, 12.1 at the time of discharge 5. Your hemoglobin is stable at the time of discharge, 8.7. Besides right knee hematoma there is no other source of bleed. Right knee hematoma has been evaluated by orthopedic surgery and it is considered  to be stable. 6. your kidney function is within normal limits throughout his hospitalization 7. because of shoulder pain and wrist pain on the right side we have obtained x-rays which did not reveal acute or chronic fractures 8. scrotal edema is chronic, recommend scrotal elevation. There were no acute findings on scrotal ultrasound. 9. your BNP on the admission was slightly above 6000 and you have been given Lasix 40 mg IV twice daily. We will continue Lasix on discharge, 40 mg daily. Monitor renal function at least once a week to make sure it is stable. As noted above renal function is within normal limits throughout the hospital stay. 10. Because Lasix can deplete potassium sources please and supplement with potassium 20 mEq daily. 11 continue Corag 6.25 mg twice daily 12. You may continue lipid-lowering agent, simvastatin 5 mg at bedtime 13. Continue physical therapy  in skilled nursing facility as patient tolerates it 14. Please note that patient was on Coumadin in recent past for atrial fibrillation. This Coumadin has been stopped on prior hospitalization due to knee hematoma. This was supposed to be reevaluated by primary care physician when hematoma resolves (meaning restarting the Coumadin outpatient based on primary care physician evaluation of hematoma resolution). Patient still has hematoma for which reason we did not reinitiate the Coumadin due to high risk of bleeding 15. I have made an appointment for you with your primary care physician in 05/08/2013 10:30 AM. I have informed her of your hospitalization and that you're going to skilled nursing facility but you can still follow with her as your primary care provider.    Discharge wound care:  As directed     Comments:      Wound care: 2 areas: proximal 5cm x 5.5cm , distal 5cm x 8cm hard black eschar (stable) over right patella and extending distally and medially, no drainage. May use white petrolatum (Vaseline) gauze dressing topped by an  ABD and kerlix and changed twice daily so that you can monitor stable eschar and surrounding tissue. Abdominal panus: recommendation is for an antimicrobial textile product to treat the intertriginous dermatitis (yeast overgrowth with skin breakdown) in the groin, perineal and abdominal pannus areas.    Increase activity slowly  As directed     Increase activity slowly  As directed         Medication List    STOP taking these medications       amoxicillin-clavulanate 875-125 MG per tablet  Commonly known as:  AUGMENTIN     HYDROcodone-acetaminophen 5-325 MG per tablet  Commonly known as:  NORCO/VICODIN      TAKE these medications       albuterol (5 MG/ML) 0.5% nebulizer solution  Commonly known as:  PROVENTIL  Take 0.5 mLs (2.5 mg total) by nebulization every 4 (four) hours as needed for wheezing or shortness of breath.     carvedilol 6.25 MG tablet  Commonly known as:  COREG  Take 6.25 mg by mouth 2 (two) times daily with a meal.     doxycycline 100 MG tablet  Commonly known as:  VIBRA-TABS  Take 1 tablet (100 mg total) by mouth 2 (two) times daily.     fluconazole 100 MG tablet  Commonly known as:  DIFLUCAN  Take 2 tablets (200 mg total) by mouth daily.     furosemide 40 MG tablet  Commonly known as:  LASIX  Take 1 tablet (40 mg total) by mouth daily.     nystatin 100000 UNIT/GM Powd  Apply 60 g topically 3 (three) times daily.     potassium chloride 10 MEQ tablet  Commonly known as:  K-DUR  Take 2 tablets (20 mEq total) by mouth 2 (two) times daily.     pravastatin 10 MG tablet  Commonly known as:  PRAVACHOL  Take 10 mg by mouth daily.     senna-docusate 8.6-50 MG per tablet  Commonly known as:  Senokot-S  Take 1 tablet by mouth at bedtime as needed.     thiamine 100 MG tablet  Take 1 tablet (100 mg total) by mouth daily.     traMADol 100 MG 24 hr tablet  Commonly known as:  ULTRAM ER  Take 1 tablet (100 mg total) by mouth daily.            Follow-up Information   Follow up with [name], MD On  05/08/2013. (10:30 am)    Contact information:   [address] [place] [address] [phone]        The results of significant diagnostics from this hospitalization (including imaging, microbiology, ancillary and laboratory) are listed below for reference.    Significant Diagnostic Studies: Dg Shoulder 1v Right  04/23/2013   *RADIOLOGY REPORT*  Clinical Data: Right shoulder pain and decreased range of motion.  RIGHT SHOULDER - 2 VIEW  Comparison: None.  Findings: Internal and external rotation views show no evidence of acute fracture or dislocation.  No other significant bone abnormality identified.  IMPRESSION: No acute findings.   Original Report Authenticated By: Myles Rosenthal, M.D.   Dg Wrist Complete Right  04/23/2013   *RADIOLOGY REPORT*  Clinical Data: Fall.  Pain  RIGHT WRIST - COMPLETE 3+ VIEW  Comparison: None  Findings: Negative for fracture.  Normal alignment with mild degenerative change in the wrist joint.  Mild degenerative changes base of thumb.  IMPRESSION: Negative for fracture.   Original Report Authenticated By: Janeece Riggers, M.D.   Dg Tibia/fibula Right  04/11/2013   *RADIOLOGY REPORT*  Clinical Data: Swelling and bruising secondary to trauma 10 days ago.  RIGHT TIBIA AND FIBULA - 2 VIEW  Comparison: None  Findings: The tibia and fibula appear normal.  There is what is probably a soft tissue hematoma or abscess in the subcutaneous soft tissues at the anterior medial aspect of the right lower leg.  IMPRESSION: No osseous abnormality.  Soft tissue hematoma and/or abscess.   Original Report Authenticated By: Francene Boyers, M.D.   Ct Pelvis W Contrast  04/22/2013   *RADIOLOGY REPORT*  Clinical Data:  Bilateral scrotal loculated fluid collections. Clinical concern for gangrene.  CT PELVIS WITH CONTRAST  Technique:  Multidetector CT imaging of the pelvis was performed using the  standard protocol following the bolus administration of intravenous contrast.  Contrast: OMNIPAQUE IOHEXOL 300 MG/ML  SOLN  Comparison:  Scrotal ultrasound obtained earlier today.  Findings:  Previously noted bilateral scrotal fluid collections. No soft tissue gas or abnormal enhancement in the scrotum or pelvis.  Foley catheter in the urinary bladder with associated air in the bladder.  No enlarged lymph nodes.  Lower lumbar spine degenerative changes.  IMPRESSION: Previously noted bilateral scrotal fluid collections.  No soft tissue gas.   Original Report Authenticated By: Beckie Salts, M.D.   US Scrotum  04/22/2013   *RADIOLOGY REPORT*  Clinical Data:  Large scrotal swelling.  SCROTAL ULTRASOUND DOPPLER ULTRASOUND OF THE TESTICLES  Technique: Complete ultrasound examination of the testicles, epididymis, and other scrotal structures was performed.  Color and spectral Doppler ultrasound were also utilized to evaluate blood flow to the testicles.  Comparison:  None.  Findings:  Right testis:  Normal, measuring 3.9 x 2.9 x 1.8 cm  Left testis:  Normal, measuring 3.7 x 2.5 x 1.6 cm  Right epididymis:  Four large rounded fluid collections in the expected position of the head of the epididymis.  These contain low- level internal echoes.  The largest measures 4.3 cm in maximum diameter.  Additional smaller amount of fluid surrounding the right testicle and moderate amount of fluid containing more internal echoes inferior to the testicle.  The right epididymis is not visualized separate from these fluid collections.  Left epididymis:  Very large loculated fluid collection containing multiple thin and mildly thickened internal septations lateral and posterior to the left testicle, displacing the testicle medially and anteriorly.  This measures approximately 14.7 x 9.0 x 5.5 cm. The left epididymis is not visualized separate from this fluid collection.  Hydrocele:  Small right  Varicocele:  None.  Pulsed Doppler  interrogation of both testes demonstrates low resistance flow bilaterally.  IMPRESSION: Large, loculated and septated fluid collections bilaterally, as described above.  Neither epididymis is visualized separate from these fluid collections.  These could be due to chronic or previous epididymitis with reactive, loculated and septated fluid collections.  These could also represent unusual loculated hydroceles.   Original Report Authenticated By: Beckie Salts, M.D.   [place] Art/ven Flow Abd Pelv Doppler Limited  04/22/2013   *RADIOLOGY REPORT*  Clinical Data:  Large scrotal swelling.  SCROTAL ULTRASOUND DOPPLER ULTRASOUND OF THE TESTICLES  Technique: Complete ultrasound examination of the testicles, epididymis, and other scrotal structures was performed.  Color and spectral Doppler ultrasound were also utilized to evaluate blood flow to the testicles.  Comparison:  None.  Findings:  Right testis:  Normal, measuring 3.9 x 2.9 x 1.8 cm  Left testis:  Normal, measuring 3.7 x 2.5 x 1.6 cm  Right epididymis:  Four large rounded fluid collections in the expected position of the head of the epididymis.  These contain low- level internal echoes.  The largest measures 4.3 cm in maximum diameter.  Additional smaller amount of fluid surrounding the right testicle and moderate amount of fluid containing more internal echoes inferior to the testicle.  The right epididymis is not visualized separate from these fluid collections.  Left epididymis:  Very large loculated fluid collection containing multiple thin and mildly thickened internal septations lateral and posterior to the left testicle, displacing the testicle medially and anteriorly.  This measures approximately 14.7 x 9.0 x 5.5 cm. The left epididymis is not visualized separate from this fluid collection.  Hydrocele:  Small right  Varicocele:  None.  Pulsed Doppler interrogation of both testes demonstrates low resistance flow bilaterally.  IMPRESSION: Large, loculated and  septated fluid collections bilaterally, as described above.  Neither epididymis is visualized separate from these fluid collections.  These could be due to chronic or previous epididymitis with reactive, loculated and septated fluid collections.  These could also represent unusual loculated hydroceles.   Original Report Authenticated By: Beckie Salts, M.D.   Dg Chest Port 1 View  04/22/2013   *RADIOLOGY REPORT*  Clinical Data: Weakness.  Cardiomyopathy appear  PORTABLE CHEST - 1 VIEW  Comparison: And 12/25/2012.  Findings: Cardiomegaly.  Pulmonary vascular congestion/mild pulmonary edema.  No gross pneumothorax or segmental infiltrate.  Calcified aortic knob.  IMPRESSION: Cardiomegaly.  Pulmonary vascular congestion/mild pulmonary edema.   Original Report Authenticated By: Lacy Duverney, M.D.   Dg Knee Complete 4 Views Right  04/11/2013   *RADIOLOGY REPORT*  Clinical Data: Traumatic injury 10 days previous with proximal calf swelling  RIGHT KNEE - COMPLETE 4+ VIEW  Comparison: None.  Findings: No acute fracture or dislocation is noted.  There is a considerable amount of soft tissue swelling in the infrapatellar region anteriorly as well as a prominent 9 x 6 cm soft tissue density noted in the proximal medial calf.  This is likely related to the recent injury and hematoma although the need for further evaluation can be determined on a clinical basis.  IMPRESSION: Likely soft tissue swelling and hematoma from recent injury.  No acute bony abnormality is noted.   Original Report Authenticated By: Alcide Clever, M.D.    Microbiology: Recent Results (from the past 240 hour(s))  WOUND CULTURE  Status: None   Collection Time    04/20/13 12:44 PM      Result Value Range Status   Specimen Description WOUND KNEE RIGHT   Final   Special Requests NONE   Final   Gram Stain     Final   Value: FEW WBC PRESENT, PREDOMINANTLY PMN     NO SQUAMOUS EPITHELIAL CELLS SEEN     NO ORGANISMS SEEN   Culture NO GROWTH 2  DAYS   Final   Report Status 04/22/2013 FINAL   Final  ANAEROBIC CULTURE     Status: None   Collection Time    04/20/13 12:44 PM      Result Value Range Status   Specimen Description WOUND KNEE RIGHT   Final   Special Requests NONE   Final   Gram Stain     Final   Value: FEW WBC PRESENT, PREDOMINANTLY PMN     NO SQUAMOUS EPITHELIAL CELLS SEEN     NO ORGANISMS SEEN   Culture NO ANAEROBES ISOLATED   Final   Report Status 04/25/2013 FINAL   Final  CULTURE, BLOOD (ROUTINE X 2)     Status: None   Collection Time    04/22/13 10:01 PM      Result Value Range Status   Specimen Description BLOOD RIGHT FOREARM   Final   Special Requests BOTTLES DRAWN AEROBIC AND ANAEROBIC 3CC   Final   Culture  Setup Time 04/23/2013 03:24   Final   Culture     Final   Value:        BLOOD CULTURE RECEIVED NO GROWTH TO DATE CULTURE WILL BE HELD FOR 5 DAYS BEFORE ISSUING A FINAL NEGATIVE REPORT   Report Status PENDING   Incomplete  CULTURE, BLOOD (ROUTINE X 2)     Status: None   Collection Time    04/22/13 10:07 PM      Result Value Range Status   Specimen Description BLOOD RIGHT HAND   Final   Special Requests BOTTLES DRAWN AEROBIC AND ANAEROBIC 4CC   Final   Culture  Setup Time 04/23/2013 03:24   Final   Culture     Final   Value:        BLOOD CULTURE RECEIVED NO GROWTH TO DATE CULTURE WILL BE HELD FOR 5 DAYS BEFORE ISSUING A FINAL NEGATIVE REPORT   Report Status PENDING   Incomplete  MRSA PCR SCREENING     Status: None   Collection Time    04/23/13  3:11 AM      Result Value Range Status   MRSA by PCR NEGATIVE  NEGATIVE Final   Comment:            The GeneXpert MRSA Assay (FDA     approved for NASAL specimens     only), is one component of a     comprehensive MRSA colonization     surveillance program. It is not     intended to diagnose MRSA     infection nor to guide or     monitor treatment for     MRSA infections.     Labs: Basic Metabolic Panel:  Recent Labs Lab 04/22/13 1954  04/23/13 0342 04/23/13 0522 04/24/13 0343  NA 142 141  --  143  K 4.4 4.2  --  3.7  CL 107 106  --  102  CO2 28 27  --  32  GLUCOSE 112* 116*  --  119*  BUN 11 11  --  14  CREATININE 0.83 0.85 0.83 0.91  CALCIUM 9.1 8.6  --  8.8   Liver Function Tests:  Recent Labs Lab 04/22/13 1954  AST 23  ALT 19  ALKPHOS 64  BILITOT 0.5  PROT 7.3  ALBUMIN 2.5*   No results found for this basename: LIPASE, AMYLASE,  in the last 168 hours No results found for this basename: AMMONIA,  in the last 168 hours CBC:  Recent Labs Lab 04/22/13 1954 04/23/13 0342 04/24/13 0343 04/25/13 0426  WBC 12.1* 12.9* 14.3* 12.1*  NEUTROABS 10.7* 11.2*  --  10.5*  HGB 9.9* 9.3* 9.3* 8.7*  HCT 31.0* 28.5* 27.3* 27.4*  MCV 99.4 98.3 97.8 98.6  PLT 380 331 329 368   Cardiac Enzymes:  Recent Labs Lab 04/22/13 1954  TROPONINI <0.30   BNP: BNP (last 3 results)  Recent Labs  04/22/13 1954  PROBNP 6340.0*   CBG: No results found for this basename: GLUCAP,  in the last 168 hours  Time coordinating discharge: Over 30 minutes  Signed:  Manson Passey, MD  [facility] 04/25/2013, 3:42 PM  Pager #: [phone]

## 2013-04-25 NOTE — Progress Notes (Signed)
Clinical Social Work Department BRIEF PSYCHOSOCIAL ASSESSMENT 04/25/2013  Patient:  [name], [name]     Account Number:  [id number]     Admit date:  04/22/2013  Clinical Social Worker:  Orpah Greek  Date/Time:  04/25/2013 10:46 AM  Referred by:  Physician  Date Referred:  04/25/2013 Referred for  SNF Placement   Other Referral:   Interview type:  Patient Other interview type:   and brother, Richard    PSYCHOSOCIAL DATA Living Status:  PARENTS Admitted from facility:   Level of care:   Primary support name:  [name] (brother) cell#: [phone] Primary support relationship to patient:  SIBLING Degree of support available:   good    CURRENT CONCERNS Current Concerns  Post-Acute Placement   Other Concerns:    SOCIAL WORK ASSESSMENT / PLAN CSW spoke with patient & brother, Richard via phone re: discharge planning. Patient is agreeable with plan for SNF.   Assessment/plan status:  Information/Referral to [facility] Other assessment/ plan:   Information/referral to community resources:   CSW completed FL2 and faxed information out to [facility].    PATIENT'S/FAMILY'S RESPONSE TO PLAN OF CARE: Patient/brother requested [facility], though they are out of network with patient's insurance [facility]). CSW provided bed offers to patient and they accepted bed offer [facility]. Patient lives in [place] with his 66 year old father and would like to stay close to him.       Richard Bailey, LCSW [facility] Clinical Social Worker cell #: [phone]

## 2013-04-26 ENCOUNTER — Emergency Department (HOSPITAL_COMMUNITY)
Admission: EM | Admit: 2013-04-26 | Discharge: 2013-04-27 | Disposition: A | Discharge status: Home / Self Care | Payer: Medicare HMO | Attending: Emergency Medicine | Admitting: Emergency Medicine

## 2013-04-26 ENCOUNTER — Emergency Department (HOSPITAL_COMMUNITY): Payer: Medicare HMO

## 2013-04-26 ENCOUNTER — Encounter (HOSPITAL_COMMUNITY): Payer: Self-pay | Admitting: Emergency Medicine

## 2013-04-26 DIAGNOSIS — E669 Obesity, unspecified: Secondary | ICD-10-CM | POA: Insufficient documentation

## 2013-04-26 DIAGNOSIS — I4891 Unspecified atrial fibrillation: Secondary | ICD-10-CM | POA: Insufficient documentation

## 2013-04-26 DIAGNOSIS — F10229 Alcohol dependence with intoxication, unspecified: Secondary | ICD-10-CM | POA: Insufficient documentation

## 2013-04-26 DIAGNOSIS — S8011XA Contusion of right lower leg, initial encounter: Secondary | ICD-10-CM

## 2013-04-26 DIAGNOSIS — L538 Other specified erythematous conditions: Secondary | ICD-10-CM | POA: Insufficient documentation

## 2013-04-26 DIAGNOSIS — X58XXXA Exposure to other specified factors, initial encounter: Secondary | ICD-10-CM | POA: Insufficient documentation

## 2013-04-26 DIAGNOSIS — Z87891 Personal history of nicotine dependence: Secondary | ICD-10-CM | POA: Insufficient documentation

## 2013-04-26 DIAGNOSIS — Z8679 Personal history of other diseases of the circulatory system: Secondary | ICD-10-CM | POA: Insufficient documentation

## 2013-04-26 DIAGNOSIS — Z79899 Other long term (current) drug therapy: Secondary | ICD-10-CM | POA: Insufficient documentation

## 2013-04-26 DIAGNOSIS — Z8719 Personal history of other diseases of the digestive system: Secondary | ICD-10-CM | POA: Insufficient documentation

## 2013-04-26 DIAGNOSIS — I10 Essential (primary) hypertension: Secondary | ICD-10-CM | POA: Insufficient documentation

## 2013-04-26 DIAGNOSIS — Y939 Activity, unspecified: Secondary | ICD-10-CM | POA: Insufficient documentation

## 2013-04-26 DIAGNOSIS — I509 Heart failure, unspecified: Secondary | ICD-10-CM | POA: Insufficient documentation

## 2013-04-26 DIAGNOSIS — Z862 Personal history of diseases of the blood and blood-forming organs and certain disorders involving the immune mechanism: Secondary | ICD-10-CM | POA: Insufficient documentation

## 2013-04-26 DIAGNOSIS — L304 Erythema intertrigo: Secondary | ICD-10-CM

## 2013-04-26 DIAGNOSIS — Y9210 Unspecified residential institution as the place of occurrence of the external cause: Secondary | ICD-10-CM | POA: Insufficient documentation

## 2013-04-26 DIAGNOSIS — Y999 Unspecified external cause status: Secondary | ICD-10-CM | POA: Insufficient documentation

## 2013-04-26 DIAGNOSIS — S8010XA Contusion of unspecified lower leg, initial encounter: Secondary | ICD-10-CM | POA: Insufficient documentation

## 2013-04-26 LAB — CBC WITH DIFFERENTIAL/PLATELET
Basophils Absolute: 0 10*3/uL (ref 0.0–0.1)
Basophils Relative: 0 % (ref 0–1)
Eosinophils Absolute: 0.1 10*3/uL (ref 0.0–0.7)
Eosinophils Relative: 1 % (ref 0–5)
HCT: 29.4 % — ABNORMAL LOW (ref 39.0–52.0)
Hemoglobin: 9.5 g/dL — ABNORMAL LOW (ref 13.0–17.0)
Lymphocytes Relative: 9 % — ABNORMAL LOW (ref 12–46)
Lymphs Abs: 1 10*3/uL (ref 0.7–4.0)
MCH: 30.9 pg (ref 26.0–34.0)
MCHC: 32.3 g/dL (ref 30.0–36.0)
MCV: 95.8 fL (ref 78.0–100.0)
Monocytes Absolute: 0.6 10*3/uL (ref 0.1–1.0)
Monocytes Relative: 6 % (ref 3–12)
Neutro Abs: 9.3 10*3/uL — ABNORMAL HIGH (ref 1.7–7.7)
Neutrophils Relative %: 85 % — ABNORMAL HIGH (ref 43–77)
Platelets: 355 10*3/uL (ref 150–400)
RBC: 3.07 MIL/uL — ABNORMAL LOW (ref 4.22–5.81)
RDW: 13.3 % (ref 11.5–15.5)
WBC: 10.9 10*3/uL — ABNORMAL HIGH (ref 4.0–10.5)

## 2013-04-26 LAB — BASIC METABOLIC PANEL
BUN: 26 mg/dL — ABNORMAL HIGH (ref 6–23)
CO2: 31 mEq/L (ref 19–32)
Calcium: 8.7 mg/dL (ref 8.4–10.5)
Chloride: 95 mEq/L — ABNORMAL LOW (ref 96–112)
Creatinine, Ser: 0.95 mg/dL (ref 0.50–1.35)
GFR calc Af Amer: >90 mL/min (ref >90)
GFR calc non Af Amer: 85 mL/min — ABNORMAL LOW (ref >90)
Glucose, Bld: 113 mg/dL — ABNORMAL HIGH (ref 70–99)
Potassium: 3.4 mEq/L — ABNORMAL LOW (ref 3.5–5.1)
Sodium: 135 mEq/L (ref 135–145)

## 2013-04-26 LAB — URINALYSIS, ROUTINE W REFLEX MICROSCOPIC
Glucose, UA: NEGATIVE mg/dL
Hgb urine dipstick: NEGATIVE
Ketones, ur: NEGATIVE mg/dL
Nitrite: NEGATIVE
Protein, ur: NEGATIVE mg/dL
Specific Gravity, Urine: 1.032 — ABNORMAL HIGH (ref 1.005–1.030)
Urobilinogen, UA: 1 mg/dL (ref 0.0–1.0)
pH: 5 (ref 5.0–8.0)

## 2013-04-26 LAB — URINE MICROSCOPIC-ADD ON

## 2013-04-26 LAB — CG4 I-STAT (LACTIC ACID): Lactic Acid, Venous: 1.18 mmol/L (ref 0.5–2.2)

## 2013-04-26 NOTE — ED Notes (Signed)
Bed:WA17<BR> Expected date:<BR> Expected time:<BR> Means of arrival:<BR> Comments:<BR> ems

## 2013-04-26 NOTE — ED Notes (Signed)
Brought in by EMS from [facility] facility with c/o fever.  Per EMS, staff at the facility has obtained pt's temperature of T101 with "some shortness of breath" and called EMS; pt was not given any medication to decrease fever; pt's O2 sat 94-95% on room air on pt's arrival to ED.

## 2013-04-26 NOTE — ED Provider Notes (Signed)
History     67 year old male sent from nursing home for evaluation of fever. Purpura but fever to 101. No report of antipyretics before arrival. Patient with no acute complaints. Recent hospitalization w/ discharge yesterday. Admitted with CHF exacerbation, possible abdominal wall cellulitis/intertrigo, R knee hematoma/wounds, RUE pain.  Denies new symptoms since DC. Has foley. Says was placed yesterday because having trouble using hand held urinal. Pt has not had subjective fevers. No respiratory complaints. No cough. Was discharged on doxycycline.    CSN: [id number] Arrival date & time 04/26/13  2053  First MD Initiated Contact with Patient 04/26/13 2115     Chief Complaint  Patient presents with  . Fever   (Consider location/radiation/quality/duration/timing/severity/associated sxs/prior Treatment) HPI Past Medical History  Diagnosis Date  . Cardiomyopathy     nonischemic  . CHF (congestive heart failure)     ejection fractio of 45% per echo in 2011 (previos EF 35-40% in 2003)  . Alcoholism   . Atrial fibrillation or flutter   . Hypertension   . Obesity   . Ventricular tachycardia     nonsustained, asymptomatic  . Anemia   . Hypoxemia   . GERD (gastroesophageal reflux disease)     takes tums prn   Past Surgical History  Procedure Laterality Date  . Tonsillectomy    . Knee bursectomy Right 04/20/2013    Procedure: Right knee prepatella bursa decompression;  Surgeon: Cammy Copa, MD;  Location: [facility] OR;  Service: Orthopedics;  Laterality: Right;   Family History  Problem Relation Age of Onset  . COPD Mother   . Hypertension Father    History  Substance Use Topics  . Smoking status: Former Smoker    Start date: 06/29/1987  . Smokeless tobacco: Never Used     Comment: stopped in 1985  . Alcohol Use: 6.0 oz/week    10 Cans of beer per week    Review of Systems  All systems reviewed and negative, other than as noted in HPI.   Allergies  Review of  patient's allergies indicates no known allergies.  Home Medications   Current Outpatient Rx  Name  Route  Sig  Dispense  Refill  . carvedilol (COREG) 6.25 MG tablet   Oral   Take 6.25 mg by mouth 2 (two) times daily with a meal.         . doxycycline (VIBRA-TABS) 100 MG tablet   Oral   Take 1 tablet (100 mg total) by mouth 2 (two) times daily.   28 tablet   0   . fluconazole (DIFLUCAN) 100 MG tablet   Oral   Take 2 tablets (200 mg total) by mouth daily.   14 tablet   0   . furosemide (LASIX) 40 MG tablet   Oral   Take 1 tablet (40 mg total) by mouth daily.   30 tablet   0   . nystatin (MYCOSTATIN/NYSTOP) 100000 UNIT/GM POWD   Topical   Apply 60 g topically 3 (three) times daily.   1 Bottle   0   . potassium chloride (K-DUR) 10 MEQ tablet   Oral   Take 2 tablets (20 mEq total) by mouth 2 (two) times daily.   30 tablet   0   . pravastatin (PRAVACHOL) 10 MG tablet   Oral   Take 10 mg by mouth daily.         Marland Kitchen thiamine 100 MG tablet   Oral   Take 1 tablet (100 mg total) by mouth  daily.   30 tablet   0   . traMADol (ULTRAM ER) 100 MG 24 hr tablet   Oral   Take 1 tablet (100 mg total) by mouth daily.   30 tablet   0   . albuterol (PROVENTIL) (5 MG/ML) 0.5% nebulizer solution   Nebulization   Take 0.5 mLs (2.5 mg total) by nebulization every 4 (four) hours as needed for wheezing or shortness of breath.   20 mL   12   . senna-docusate (SENOKOT-S) 8.6-50 MG per tablet   Oral   Take 1 tablet by mouth at bedtime as needed.   30 tablet   0    BP 119/65  Pulse 74  Temp(Src) 98.3 F (36.8 C) (Oral)  Resp 18  SpO2 95% Physical Exam  Nursing note and vitals reviewed. Constitutional: He appears well-developed and well-nourished. No distress.  Laying in bed. NAD. Obese.  HENT:  Head: Normocephalic and atraumatic.  Eyes: Conjunctivae are normal. Right eye exhibits no discharge. Left eye exhibits no discharge.  Neck: Neck supple.  Cardiovascular:  Normal rate, regular rhythm and normal heart sounds.  Exam reveals no gallop and no friction rub.   No murmur heard. Pulmonary/Chest: Effort normal and breath sounds normal. No respiratory distress.  Abdominal: Soft. He exhibits no distension. There is no tenderness.  Genitourinary:  Foley with clear yellow urine in bag  Musculoskeletal: He exhibits no edema and no tenderness.  r knee with flaccid hematoma with 2 large areas of black adherent eschar anteriorly and medially. No drainage. NO surrounding cellulitis. Swelling of R distal wrist/hand. Faint erythema. Pain with ROM. NVI distally.   Neurological: He is alert.  Skin:  Pannus/groin intertrigo   Psychiatric: He has a normal mood and affect. His behavior is normal. Thought content normal.    ED Course  Procedures (including critical care time) Labs Reviewed  URINALYSIS, ROUTINE W REFLEX MICROSCOPIC - Abnormal; Notable for the following:    Color, Urine AMBER (*)    APPearance CLOUDY (*)    Specific Gravity, Urine 1.032 (*)    Bilirubin Urine SMALL (*)    Leukocytes, UA SMALL (*)    All other components within normal limits  CBC WITH DIFFERENTIAL - Abnormal; Notable for the following:    WBC 10.9 (*)    RBC 3.07 (*)    Hemoglobin 9.5 (*)    HCT 29.4 (*)    Neutrophils Relative % 85 (*)    Neutro Abs 9.3 (*)    Lymphocytes Relative 9 (*)    All other components within normal limits  BASIC METABOLIC PANEL - Abnormal; Notable for the following:    Potassium 3.4 (*)    Chloride 95 (*)    Glucose, Bld 113 (*)    BUN 26 (*)    GFR calc non Af Amer 85 (*)    All other components within normal limits  URINE MICROSCOPIC-ADD ON  CG4 I-STAT (LACTIC ACID)   Dg Chest Portable 1 View  04/26/2013   *RADIOLOGY REPORT*  Clinical Data: Fever and weakness.  PORTABLE CHEST - 1 VIEW  Comparison: 04/22/2013  Findings: Single view of the chest demonstrates cardiomegaly which is unchanged.  Again noted are prominent interstitial lung markings  and enlarged central vascular structures.  The trachea is midline. No focal airspace disease.  Bony thorax is intact.  IMPRESSION: Stable cardiomegaly and prominent lung markings. Findings may represent vascular congestion.   Original Report Authenticated By: Richarda Overlie, M.D.   1. Traumatic hematoma  of right lower leg   2. Intertrigo     MDM   [phone] brought in for evaluation of fever. Afebrile in ED. Pt reports his symptoms stable since recent DC. Lactic acid normal. Mild leukocytosis but improving from recent labs. Normotensive. Not tachycardic and 02 sats good on RA. CXR w/o acute abnormality.  Questionable vascular congestion, but pt w/o respiratory complaints. On lasix. UA not particularly suggestive of infection. Plan continued doxycycline as previously prescribed. I feel pt is stable for transfer back to his facility.   Raeford Razor, MD 04/27/13 [phone]

## 2013-04-29 LAB — CULTURE, BLOOD (ROUTINE X 2): Culture: NO GROWTH

## 2013-05-29 ENCOUNTER — Encounter (HOSPITAL_BASED_OUTPATIENT_CLINIC_OR_DEPARTMENT_OTHER): Payer: Medicare HMO | Attending: Plastic Surgery

## 2013-05-29 DIAGNOSIS — F1010 Alcohol abuse, uncomplicated: Secondary | ICD-10-CM | POA: Insufficient documentation

## 2013-05-29 DIAGNOSIS — I509 Heart failure, unspecified: Secondary | ICD-10-CM | POA: Insufficient documentation

## 2013-05-29 DIAGNOSIS — I4891 Unspecified atrial fibrillation: Secondary | ICD-10-CM | POA: Insufficient documentation

## 2013-05-29 DIAGNOSIS — E785 Hyperlipidemia, unspecified: Secondary | ICD-10-CM | POA: Insufficient documentation

## 2013-05-29 DIAGNOSIS — L97809 Non-pressure chronic ulcer of other part of unspecified lower leg with unspecified severity: Secondary | ICD-10-CM | POA: Insufficient documentation

## 2013-05-29 DIAGNOSIS — I10 Essential (primary) hypertension: Secondary | ICD-10-CM | POA: Insufficient documentation

## 2013-05-29 DIAGNOSIS — Z79899 Other long term (current) drug therapy: Secondary | ICD-10-CM | POA: Insufficient documentation

## 2013-05-29 DIAGNOSIS — K219 Gastro-esophageal reflux disease without esophagitis: Secondary | ICD-10-CM | POA: Insufficient documentation

## 2013-05-29 NOTE — Progress Notes (Signed)
[facility]  NAME:  [name], [name]               ACCOUNT NO.:  [id number]  MEDICAL RECORD NO.:  [id number]      DATE OF BIRTH:  [birth year]  PHYSICIAN:  Wayland Denis, DO       VISIT DATE:  05/29/2013                                  OFFICE VISIT   HISTORY:  The patient is a 67 year old male who has multiple medical conditions and comorbidities, but he has been seen here for 2 ulcers of his right leg, one slightly distal and medial to the right knee and the other on the knee.  Approximately 2 months ago, he fell and sustained what he describes as a scrape, and the following day, had a large hematoma.  He then developed swelling, cellulitis, and was admitted to the hospital.  He underwent antibiotic treatment with vancomycin and Zosyn, which seems to have helped.  He was admitted to SNF; from there, he had an episode of lethargy, hypoxia, and tachypnea, from which he was admitted back to the ER.  Ultrasound of the testicular area showed chronic hydroceles, but no acute issues.  PAST MEDICAL HISTORY:  Positive for hypertension, morbid obesity, atrial fibrillation, hyperlipidemia, congestive heart failure, gastroesophageal reflux, and alcoholism.  MEDICATIONS:  Simvastatin, albuterol, fluconazole, furosemide, nystatin, potassium, senna, docusate, thiamine, tramadol.  ALLERGIES:  No known drug allergies.  PAST SURGICAL HISTORY:  Knee surgery and tonsillectomy.  REVIEW OF SYSTEMS:  Otherwise negative.  SOCIAL HISTORY:  He is currently living at [facility] and he does have some family involved.  PHYSICAL EXAMINATION:  GENERAL:  He is alert, cooperative appreciative. He is not in any acute distress.  He is pleasant. HEENT:  Pupils were equal.  Extraocular muscles were intact. NECK:  He does not have any cervical lymphadenopathy.  He had a full beard. LUNGS:  His breathing is unlabored. HEART:  His heart rate is regular. ABDOMEN:  Large, but soft.  He is in a  wheelchair, so no organomegaly appreciated. EXTREMITIES:  His lower extremity pulses are weak, but present.  He has got varicose veins and varicosities indicative of peripheral vascular disease.  He has an ulcer on the right medial leg distal to the knee and at the knee.  The area that is medial to the knee is 5.9 x 9.2 x 2, and the knee area is 4.2 x 6.3 x 0.4.  The underlying tissue is pink in color and does bleed well.  This was after debridement.  It seemed to clean up quite well, and is consistent with a hematoma.  The leg is a little swollen, little red, but does not look to be acutely cellulitic.  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  PLAN:  For wet-to-dry dressings 2-3 times a day for the next week, elevation, check a pre-albumin, multivitamin, vitamin C, zinc, high- protein diet, and follow up in a week.  If we do not see marked improvement quickly, then we will have to go with peripheral vascular studies as it is very likely he does have disease, but whether or not it is possible to intervene is questionable.     Wayland Denis, DO     CS/MEDQ  D:  05/29/2013  T:  05/29/2013  Job:  [id number]

## 2013-06-05 NOTE — Progress Notes (Signed)
[facility]  NAME:  [name], [name]               ACCOUNT NO.:  [id number]  MEDICAL RECORD NO.:  [id number]      DATE OF BIRTH:  07-11-1946  PHYSICIAN:  Wayland Denis, DO       VISIT DATE:  06/05/2013                                  OFFICE VISIT   The patient is a 67 year old gentleman who is here for followup on his right lower extremity traumatic ulcers.  He is doing much better from last week.  He has got some granulation tissue on at least half of the base of both wounds.  No sign of infection.  He does have improvement overall.  REVIEW OF SYSTEMS:  Otherwise negative.  He is still at [facility] and doing better with his physical therapy.  There has been no change in his medications.  PHYSICAL EXAMINATION:  On exam, he is alert and oriented, cooperative, not in any acute distress.  He is a very pleasant gentleman.  Pupils are equal.  No cervical lymphadenopathy.  His breathing is unlabored.  His heart rate is regular.  His abdomen is soft.  The wounds have marked improvement with granulation tissue, more debridement was done today with a curette.  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  Continue with high protein intake, multivitamin, vitamin C, zinc.  The pre-albumin had not been checked as of last week, so we are writing for that to be checked again and then elevation is also very important.     Wayland Denis, DO     CS/MEDQ  D:  06/05/2013  T:  06/05/2013  Job:  [id number]

## 2013-06-19 ENCOUNTER — Encounter (HOSPITAL_BASED_OUTPATIENT_CLINIC_OR_DEPARTMENT_OTHER): Payer: Medicare HMO | Attending: Plastic Surgery

## 2013-06-19 DIAGNOSIS — L97509 Non-pressure chronic ulcer of other part of unspecified foot with unspecified severity: Secondary | ICD-10-CM | POA: Insufficient documentation

## 2013-06-19 DIAGNOSIS — I872 Venous insufficiency (chronic) (peripheral): Secondary | ICD-10-CM | POA: Insufficient documentation

## 2013-06-20 NOTE — Progress Notes (Signed)
[facility]  NAME:  [name], [name]               ACCOUNT NO.:  [id number]  MEDICAL RECORD NO.:  [id number]      DATE OF BIRTH:  May 17, 1946  PHYSICIAN:  Wayland Denis, DO       VISIT DATE:  06/19/2013                                  OFFICE VISIT   SUBJECTIVE:  The patient is a 67 year old gentleman who is here for followup on his right lower extremity chronic venous insufficiency ulcers, originally created from trauma.  He is doing better but the areas are slow to heal.  The sizes are documented in the chart and the overall appearance of the leg has improved as well.  He has a little bit of fibrous tissue and the edges of the wound, the skin areas are starting to heal down and so this is going to have slow progress.  There has been no change in medications or social history.  PHYSICAL EXAMINATION:  GENERAL:  On exam, he is very pleasant.  He is now living at home.  He has been at home for a week and is walking again.  He is alert, oriented, and cooperative.  He is not in any acute distress. HEENT:  His pupils are equal.  His extraocular muscles are intact. NECK:  He has no cervical lymphadenopathy. LUNGS:  His breathing is unlabored. HEART:  Rate is regular. EXTREMITIES:  Debridement was done with a curette and noted in the chart.  PLAN:  We will plan on debridement in the OR with placement of ACell and the VAC.     Wayland Denis, DO     CS/MEDQ  D:  06/19/2013  T:  06/20/2013  Job:  [id number]

## 2013-06-23 ENCOUNTER — Other Ambulatory Visit: Payer: Self-pay | Admitting: Plastic Surgery

## 2013-06-23 DIAGNOSIS — S81801D Unspecified open wound, right lower leg, subsequent encounter: Secondary | ICD-10-CM

## 2013-06-26 NOTE — Progress Notes (Addendum)
REVIEWED CHART W/ DR GERMEROTH, DUE TO RECENT RESPIRATORY FAILURE POST SURGERY AND CURRENT CHF, PT SHOULD BE DONE AT MAIN OR.  NOTIFIED DR SANGER OFFICE, SPOKE W/ BRITTANY.

## 2013-06-26 NOTE — Progress Notes (Signed)
[facility]  NAME:  [name], [name]               ACCOUNT NO.:  [id number]  MEDICAL RECORD NO.:  [id number]      DATE OF BIRTH:  15-Jun-1946  PHYSICIAN:  Wayland Denis, DO       VISIT DATE:  06/26/2013                                  OFFICE VISIT   SUBJECTIVE:  The patient is a 67 year old male who is here for followup on his right lower extremity ulcers.  He is doing a little bit better. He is showing some signs of improvement and some signs of healing.  There has been no change in his medications or social history.  Review of system is otherwise negative.  OBJECTIVE:  On exam, he is alert, oriented, and cooperative.  He is very pleasant and he is fully aware of the surgery scheduled for 2 days from now.  His breathing is unlabored.  His heart is regular.  He has got a fair bit of edema and Woody tight skin on the lower extremities bilaterally.  The left is actually more swollen than the right.  ASSESSMENT/PLAN:  The wound was debrided and those notes are noted.  We will continue with the Santyl.  We will plan for the OR this Wednesday for debridement ACell and VAC placement.  If the [facility] is not available, we will do home dressings until the [facility] is available.     Wayland Denis, DO     CS/MEDQ  D:  06/26/2013  T:  06/26/2013  Job:  [id number]

## 2013-06-27 ENCOUNTER — Encounter (HOSPITAL_COMMUNITY): Payer: Self-pay | Admitting: *Deleted

## 2013-06-27 ENCOUNTER — Encounter (HOSPITAL_COMMUNITY): Payer: Self-pay | Admitting: Pharmacy Technician

## 2013-06-27 MED ORDER — DEXTROSE 5 % IV SOLN
3.0000 g | INTRAVENOUS | Status: AC
  Administered 2013-06-28: 3 g via INTRAVENOUS
  Filled 2013-06-27: qty 3000

## 2013-06-27 NOTE — Progress Notes (Signed)
Pt denies SOB and chest pain. Pt states that he is under the care of Dr. Antoine Poche ( cardiologist ) at [facility]. Pt states that he had a stress test at [place] "at least 4 years ago" but denies ever having a cardiac cath. Pt made aware to stop herbal medications and NSAIDS.

## 2013-06-28 ENCOUNTER — Encounter (HOSPITAL_COMMUNITY): Payer: Self-pay | Admitting: Plastic Surgery

## 2013-06-28 ENCOUNTER — Ambulatory Visit (HOSPITAL_BASED_OUTPATIENT_CLINIC_OR_DEPARTMENT_OTHER): Admission: RE | Admit: 2013-06-28 | Payer: Medicare HMO | Source: Ambulatory Visit | Admitting: Plastic Surgery

## 2013-06-28 ENCOUNTER — Ambulatory Visit (HOSPITAL_COMMUNITY): Payer: Medicare HMO | Admitting: Anesthesiology

## 2013-06-28 ENCOUNTER — Encounter (HOSPITAL_COMMUNITY): Payer: Self-pay | Admitting: Anesthesiology

## 2013-06-28 ENCOUNTER — Encounter (HOSPITAL_COMMUNITY)
Admission: RE | Disposition: A | Discharge status: Home / Self Care | Payer: Self-pay | Source: Ambulatory Visit | Attending: Plastic Surgery

## 2013-06-28 ENCOUNTER — Ambulatory Visit (HOSPITAL_COMMUNITY)
Admission: RE | Admit: 2013-06-28 | Discharge: 2013-06-28 | Disposition: A | Discharge status: Home / Self Care | Payer: Medicare HMO | Source: Ambulatory Visit | Attending: Plastic Surgery | Admitting: Plastic Surgery

## 2013-06-28 ENCOUNTER — Encounter (HOSPITAL_BASED_OUTPATIENT_CLINIC_OR_DEPARTMENT_OTHER): Admission: RE | Payer: Self-pay | Source: Ambulatory Visit

## 2013-06-28 DIAGNOSIS — I10 Essential (primary) hypertension: Secondary | ICD-10-CM | POA: Insufficient documentation

## 2013-06-28 DIAGNOSIS — S81801D Unspecified open wound, right lower leg, subsequent encounter: Secondary | ICD-10-CM

## 2013-06-28 DIAGNOSIS — L97209 Non-pressure chronic ulcer of unspecified calf with unspecified severity: Secondary | ICD-10-CM | POA: Insufficient documentation

## 2013-06-28 DIAGNOSIS — I739 Peripheral vascular disease, unspecified: Secondary | ICD-10-CM | POA: Insufficient documentation

## 2013-06-28 DIAGNOSIS — I4891 Unspecified atrial fibrillation: Secondary | ICD-10-CM | POA: Insufficient documentation

## 2013-06-28 DIAGNOSIS — I428 Other cardiomyopathies: Secondary | ICD-10-CM | POA: Insufficient documentation

## 2013-06-28 DIAGNOSIS — K219 Gastro-esophageal reflux disease without esophagitis: Secondary | ICD-10-CM | POA: Insufficient documentation

## 2013-06-28 HISTORY — DX: Other complications of anesthesia, initial encounter: T88.59XA

## 2013-06-28 HISTORY — PX: I & D EXTREMITY: SHX5045

## 2013-06-28 HISTORY — DX: Adverse effect of unspecified anesthetic, initial encounter: T41.45XA

## 2013-06-28 LAB — CBC
HCT: 33.3 % — ABNORMAL LOW (ref 39.0–52.0)
Hemoglobin: 10.8 g/dL — ABNORMAL LOW (ref 13.0–17.0)
MCV: 96.2 fL (ref 78.0–100.0)
RDW: 16.6 % — ABNORMAL HIGH (ref 11.5–15.5)
WBC: 5.4 10*3/uL (ref 4.0–10.5)

## 2013-06-28 LAB — COMPREHENSIVE METABOLIC PANEL
Alkaline Phosphatase: 70 U/L (ref 39–117)
BUN: 11 mg/dL (ref 6–23)
Chloride: 102 mEq/L (ref 96–112)
Creatinine, Ser: 0.68 mg/dL (ref 0.50–1.35)
GFR calc Af Amer: >90 mL/min (ref >90)
GFR calc non Af Amer: >90 mL/min (ref >90)
Glucose, Bld: 90 mg/dL (ref 70–99)
Potassium: 4.1 mEq/L (ref 3.5–5.1)
Total Bilirubin: 0.5 mg/dL (ref 0.3–1.2)

## 2013-06-28 LAB — PROTIME-INR
INR: 1.28 (ref 0.00–1.49)
Prothrombin Time: 15.7 seconds — ABNORMAL HIGH (ref 11.6–15.2)

## 2013-06-28 SURGERY — IRRIGATION AND DEBRIDEMENT EXTREMITY
Anesthesia: General | Laterality: Right

## 2013-06-28 SURGERY — IRRIGATION AND DEBRIDEMENT EXTREMITY
Anesthesia: General | Site: Leg Lower | Laterality: Right | Wound class: Dirty or Infected

## 2013-06-28 MED ORDER — PHENYLEPHRINE HCL 10 MG/ML IJ SOLN
INTRAMUSCULAR | Status: DC | PRN
  Administered 2013-06-28: 80 ug via INTRAVENOUS

## 2013-06-28 MED ORDER — FENTANYL CITRATE 0.05 MG/ML IJ SOLN
INTRAMUSCULAR | Status: DC | PRN
  Administered 2013-06-28: 100 ug via INTRAVENOUS
  Administered 2013-06-28: 50 ug via INTRAVENOUS

## 2013-06-28 MED ORDER — ARTIFICIAL TEARS OP OINT
TOPICAL_OINTMENT | OPHTHALMIC | Status: DC | PRN
  Administered 2013-06-28: 1 via OPHTHALMIC

## 2013-06-28 MED ORDER — LIDOCAINE HCL (CARDIAC) 20 MG/ML IV SOLN
INTRAVENOUS | Status: DC | PRN
  Administered 2013-06-28: 50 mg via INTRAVENOUS

## 2013-06-28 MED ORDER — EPHEDRINE SULFATE 50 MG/ML IJ SOLN
INTRAMUSCULAR | Status: DC | PRN
  Administered 2013-06-28 (×2): 5 mg via INTRAVENOUS

## 2013-06-28 MED ORDER — 0.9 % SODIUM CHLORIDE (POUR BTL) OPTIME
TOPICAL | Status: DC | PRN
  Administered 2013-06-28: 1000 mL

## 2013-06-28 MED ORDER — OXYCODONE HCL 5 MG/5ML PO SOLN: 5.0000 mg | Freq: Once | ORAL | Status: DC | PRN

## 2013-06-28 MED ORDER — LACTATED RINGERS IV SOLN
INTRAVENOUS | Status: DC
  Administered 2013-06-28: 11:00:00 via INTRAVENOUS

## 2013-06-28 MED ORDER — ONDANSETRON HCL 4 MG/2ML IJ SOLN
INTRAMUSCULAR | Status: DC | PRN
  Administered 2013-06-28: 4 mg via INTRAVENOUS

## 2013-06-28 MED ORDER — GLYCOPYRROLATE 0.2 MG/ML IJ SOLN
INTRAMUSCULAR | Status: DC | PRN
  Administered 2013-06-28 (×2): 0.2 mg via INTRAVENOUS

## 2013-06-28 MED ORDER — HYDROMORPHONE HCL PF 1 MG/ML IJ SOLN: 0.2500 mg | INTRAMUSCULAR | Status: DC | PRN

## 2013-06-28 MED ORDER — OXYCODONE HCL 5 MG PO TABS: 5.0000 mg | ORAL_TABLET | Freq: Once | ORAL | Status: DC | PRN

## 2013-06-28 MED ORDER — MIDAZOLAM HCL 5 MG/5ML IJ SOLN
INTRAMUSCULAR | Status: DC | PRN
  Administered 2013-06-28: 2 mg via INTRAVENOUS

## 2013-06-28 MED ORDER — PROPOFOL 10 MG/ML IV BOLUS
INTRAVENOUS | Status: DC | PRN
  Administered 2013-06-28: 130 mg via INTRAVENOUS

## 2013-06-28 MED ORDER — LACTATED RINGERS IV SOLN
INTRAVENOUS | Status: DC | PRN
  Administered 2013-06-28: 13:00:00 via INTRAVENOUS

## 2013-06-28 SURGICAL SUPPLY — 38 items
BANDAGE ELASTIC 4 VELCRO ST LF (GAUZE/BANDAGES/DRESSINGS) ×2 IMPLANT
BANDAGE GAUZE ELAST BULKY 4 IN (GAUZE/BANDAGES/DRESSINGS) ×3 IMPLANT
CANISTER SUCTION 2500CC (MISCELLANEOUS) ×2 IMPLANT
CLOTH BEACON ORANGE TIMEOUT ST (SAFETY) ×2 IMPLANT
CONT SPEC 4OZ CLIKSEAL STRL BL (MISCELLANEOUS) ×1 IMPLANT
COVER SURGICAL LIGHT HANDLE (MISCELLANEOUS) ×2 IMPLANT
DRAPE EXTREMITY T 121X128X90 (DRAPE) ×1 IMPLANT
DRAPE ORTHO SPLIT 77X108 STRL (DRAPES) ×2
DRAPE SURG ORHT 6 SPLT 77X108 (DRAPES) IMPLANT
DRSG ADAPTIC 3X8 NADH LF (GAUZE/BANDAGES/DRESSINGS) ×1 IMPLANT
DRSG EMULSION OIL 3X3 NADH (GAUZE/BANDAGES/DRESSINGS) ×1 IMPLANT
DRSG PAD ABDOMINAL 8X10 ST (GAUZE/BANDAGES/DRESSINGS) ×2 IMPLANT
ELECT REM PT RETURN 9FT ADLT (ELECTROSURGICAL) ×2
ELECTRODE REM PT RTRN 9FT ADLT (ELECTROSURGICAL) ×1 IMPLANT
GLOVE BIO SURGEON STRL SZ 6.5 (GLOVE) ×3 IMPLANT
GLOVE BIOGEL PI IND STRL 7.0 (GLOVE) IMPLANT
GLOVE BIOGEL PI INDICATOR 7.0 (GLOVE) ×1
GLOVE SURG SS PI 7.0 STRL IVOR (GLOVE) ×2 IMPLANT
GOWN STRL NON-REIN LRG LVL3 (GOWN DISPOSABLE) ×4 IMPLANT
KIT BASIN OR (CUSTOM PROCEDURE TRAY) ×2 IMPLANT
KIT ROOM TURNOVER OR (KITS) ×2 IMPLANT
MATRIX SURGICAL PSM 10X15CM (Tissue) ×1 IMPLANT
MICROMATRIX 1000MG (Tissue) ×2 IMPLANT
MICROMATRIX 500MG (Tissue) ×2 IMPLANT
NS IRRIG 1000ML POUR BTL (IV SOLUTION) ×2 IMPLANT
PACK GENERAL/GYN (CUSTOM PROCEDURE TRAY) ×2 IMPLANT
PAD ARMBOARD 7.5X6 YLW CONV (MISCELLANEOUS) ×4 IMPLANT
SOLUTION PARTIC MCRMTRX 1000MG (Tissue) IMPLANT
SOLUTION PARTIC MCRMTRX 500MG (Tissue) IMPLANT
SPONGE GAUZE 4X4 12PLY (GAUZE/BANDAGES/DRESSINGS) ×2 IMPLANT
SPONGE LAP 18X18 X RAY DECT (DISPOSABLE) ×1 IMPLANT
STAPLER VISISTAT 35W (STAPLE) ×1 IMPLANT
SURGILUBE 2OZ TUBE FLIPTOP (MISCELLANEOUS) ×1 IMPLANT
SUT VIC AB 5-0 P-3 18XBRD (SUTURE) IMPLANT
SUT VIC AB 5-0 P3 18 (SUTURE) ×14
TOWEL OR 17X24 6PK STRL BLUE (TOWEL DISPOSABLE) ×2 IMPLANT
TOWEL OR 17X26 10 PK STRL BLUE (TOWEL DISPOSABLE) ×2 IMPLANT
UNDERPAD 30X30 INCONTINENT (UNDERPADS AND DIAPERS) ×2 IMPLANT

## 2013-06-28 NOTE — OR Nursing (Signed)
Noted patient with dressing on left lower arm that he stated was covering a "scratch from a simple fall at home" Stated his father had applied the dressing to the area and it had not been changed. He gave verbal permission for the dressing to be removed. Noted to have approximately 3-4cm skin tear with purulent drainage. MD was notified of findings.  At end of scheduled case, MD cleaned the area on patient's arm with Betadine paint and applied small amount remaining ACELL and powder from case. This was covered with small adaptic, Surgilube, 4x4, and wrapped with a Kerlix.

## 2013-06-28 NOTE — Op Note (Signed)
Operative Note   SURGICAL DIVISION: Plastic Surgery  PREOPERATIVE DIAGNOSES:  Right leg ulcers  POSTOPERATIVE DIAGNOSES:  same  PROCEDURE:  Preparation of leg ulcer with debridement of skin and subcutaneous tissue with placement of Acell (powder 1.5 g and 10 x 15 sheet)  SURGEON: Wayland Denis, DO  ASSISTANT:none  ANESTHESIA:  General.   COMPLICATIONS: None.   INDICATIONS FOR PROCEDURE:  The patient is a 67 yrs old wm here for treatment of right leg ulcer.   CONSENT:  Informed consent was obtained directly from the patient. Risks, benefits and alternatives were fully discussed. Specific risks including but not limited to bleeding, infection, hematoma, seroma, scarring, pain, implant infection, asymmetry, wound healing problems, and need for further surgery were all discussed. The patient did have an ample opportunity to have questions answered to satisfaction.   DESCRIPTION OF PROCEDURE:  The patient was taken to the operating room. SCDs were placed and IV antibiotics were given. The patient's operative site was prepped and draped in a sterile fashion. A time out was performed and all information was confirmed to be correct.  The #10 blade was used to debride the soft tissue and skin edges.  The curette was used at the base.  The bovie was used to obtain hemostasis.  The Acell powder was placed followed by the Acell sheet.  The Acell was secured to the area with 5-0 vicryl.  Adaptic was applied followed by surgical lube and 4 x 4 gauze.  The leg was wrapped with kerlex and an Ace wrap. He tolerated the procedure well.  The patient was allowed to wake from anesthesia, extubated and taken to the recovery room in satisfactory condition.

## 2013-06-28 NOTE — H&P (Signed)
[name] is an 67 y.o. male.   Chief Complaint: right leg ulcer HPI:  The patient is a 67 yrs old wm here for surgery for his right leg ulcer.  This was originally a trauma but now is a chronic ulcer with vascular insufficiency.  He has undergone debridement in the clinic with progress but slowly.  He presents for further surgical care with debridement and placement of Acell and the VAC if available.   Past Medical History  Diagnosis Date  . Cardiomyopathy     nonischemic  . CHF (congestive heart failure)     ejection fractio of 45% per echo in 2011 (previos EF 35-40% in 2003)  . Alcoholism   . Atrial fibrillation or flutter   . Hypertension   . Obesity   . Ventricular tachycardia     nonsustained, asymptomatic  . Anemia   . Hypoxemia   . GERD (gastroesophageal reflux disease)     takes tums prn  . Complication of anesthesia     April 11, 2013    Past Surgical History  Procedure Laterality Date  . Tonsillectomy    . Knee bursectomy Right 04/20/2013    Procedure: Right knee prepatella bursa decompression;  Surgeon: Cammy Copa, MD;  Location: [facility] OR;  Service: Orthopedics;  Laterality: Right;    Family History  Problem Relation Age of Onset  . COPD Mother   . Hypertension Father    Social History:  reports that he has quit smoking. He started smoking about 26 years ago. He has never used smokeless tobacco. He reports that he drinks about 6.0 ounces of alcohol per week. He reports that he does not use illicit drugs.  Allergies: No Known Allergies  No prescriptions prior to admission    No results found for this or any previous visit (from the past 48 hour(s)). No results found.  Review of Systems  Constitutional: Negative.   HENT: Negative.   Eyes: Negative.   Respiratory: Negative.   Cardiovascular: Negative.   Gastrointestinal: Negative.   Genitourinary: Negative.   Musculoskeletal: Negative.   Skin: Negative.   Neurological: Negative.     Psychiatric/Behavioral: Negative.     Height 6' (1.829 m), weight 116.574 kg (257 lb). Physical Exam  Constitutional: He appears well-developed and well-nourished.  HENT:  Head: Normocephalic and atraumatic.  Eyes: Conjunctivae are normal. Pupils are equal, round, and reactive to light.  Neck: Neck supple.  Cardiovascular: Normal rate.   Respiratory: Effort normal.  GI: Soft.  Musculoskeletal: He exhibits edema and tenderness.       Legs: Neurological: He is alert.  Skin: Skin is warm.  Psychiatric: He has a normal mood and affect. His behavior is normal. Judgment and thought content normal.     Assessment/Plan  IRRIGATION AND DEBRIDEMENT OF RIGHT LEG WITH SURGICAL PREP/PLACEMENT OF ACELL AND VAC (Right) as a surgical intervention   SANGER,Mishal Probert 06/28/2013, 10:00 AM

## 2013-06-28 NOTE — Transfer of Care (Signed)
Immediate Anesthesia Transfer of Care Note  Patient: [name]  Procedure(s) Performed: Procedure(s): IRRIGATION AND DEBRIDEMENT OF RIGHT LEG WITH SURGICAL PREP/PLACEMENT OF ACELL  (Right)  Patient Location: PACU  Anesthesia Type:General  Level of Consciousness: awake, alert , oriented and patient cooperative  Airway & Oxygen Therapy: Patient Spontanous Breathing and Patient connected to nasal cannula oxygen  Post-op Assessment: Report given to PACU RN, Post -op Vital signs reviewed and stable and Patient moving all extremities X 4  Post vital signs: Reviewed and stable  Complications: No apparent anesthesia complications

## 2013-06-28 NOTE — Brief Op Note (Signed)
06/28/2013  1:49 PM  PATIENT:  [name]  67 y.o. male  PRE-OPERATIVE DIAGNOSIS:  MULTIPLE RIGHT LEG ULCERS  POST-OPERATIVE DIAGNOSIS:  MULTIPLE RIGHT LEG ULCERS  PROCEDURE:  Procedure(s): IRRIGATION AND DEBRIDEMENT OF RIGHT LEG WITH SURGICAL PREP/PLACEMENT OF ACELL  (Right)  SURGEON:  Surgeon(s) and Role:    * Izeyah Deike Sanger, DO - Primary  PHYSICIAN ASSISTANT: none  ASSISTANTS: none   ANESTHESIA:   general  EBL:  Total I/O In: 800 [I.V.:800] Out: -   BLOOD ADMINISTERED:none  DRAINS: none   LOCAL MEDICATIONS USED:  NONE  SPECIMEN:  No Specimen  DISPOSITION OF SPECIMEN:  N/A  COUNTS:  YES  TOURNIQUET:  * No tourniquets in log *  DICTATION: .Dragon Dictation  PLAN OF CARE: Discharge to home after PACU  PATIENT DISPOSITION:  PACU - hemodynamically stable.   Delay start of Pharmacological VTE agent (>24hrs) due to surgical blood loss or risk of bleeding: no

## 2013-06-28 NOTE — Interval H&P Note (Signed)
History and Physical Interval Note:  06/28/2013 10:03 AM  [name]  has presented today for surgery, with the diagnosis of RIGHT LEG ULCER  The various methods of treatment have been discussed with the patient and family. After consideration of risks, benefits and other options for treatment, the patient has consented to  Procedure(s): IRRIGATION AND DEBRIDEMENT OF RIGHT LEG WITH SURGICAL PREP/PLACEMENT OF ACELL AND VAC (Right) as a surgical intervention .  The patient's history has been reviewed, patient examined, no change in status, stable for surgery.  I have reviewed the patient's chart and labs.  Questions were answered to the patient's satisfaction.     SANGER,Shaniah Baltes

## 2013-06-28 NOTE — Progress Notes (Signed)
Report given to angel rn as caregiver 

## 2013-06-28 NOTE — Preoperative (Signed)
Beta Blockers   Reason not to administer Beta Blockers:Not Applicable 

## 2013-06-28 NOTE — Anesthesia Postprocedure Evaluation (Signed)
  Anesthesia Post-op Note  Patient: [name]  Procedure(s) Performed: Procedure(s): IRRIGATION AND DEBRIDEMENT OF RIGHT LEG WITH SURGICAL PREP/PLACEMENT OF ACELL  (Right)  Patient Location: PACU  Anesthesia Type:General  Level of Consciousness: awake, alert  and oriented  Airway and Oxygen Therapy: Patient Spontanous Breathing and Patient connected to nasal cannula oxygen  Post-op Pain: none  Post-op Assessment: Post-op Vital signs reviewed, Patient's Cardiovascular Status Stable, Respiratory Function Stable, Patent Airway and No signs of Nausea or vomiting  Post-op Vital Signs: Reviewed and stable  Complications: No apparent anesthesia complications

## 2013-06-28 NOTE — Anesthesia Preprocedure Evaluation (Addendum)
Anesthesia Evaluation  Patient identified by MRN, date of birth, ID band Patient awake    Reviewed: Allergy & Precautions, H&P , NPO status , Patient's Chart, lab work & pertinent test results, reviewed documented beta blocker date and time   Airway Mallampati: I TM Distance: >3 FB Neck ROM: Full    Dental no notable dental hx. (+) Poor Dentition and Dental Advisory Given   Pulmonary neg pulmonary ROS,  breath sounds clear to auscultation  Pulmonary exam normal       Cardiovascular hypertension, On Medications and On Home Beta Blockers +CHF + dysrhythmias Atrial Fibrillation Rhythm:Irregular Rate:Normal     Neuro/Psych PSYCHIATRIC DISORDERS negative neurological ROS     GI/Hepatic Neg liver ROS, GERD-  ,  Endo/Other  negative endocrine ROS  Renal/GU negative Renal ROS  negative genitourinary   Musculoskeletal   Abdominal   Peds  Hematology negative hematology ROS (+) anemia ,   Anesthesia Other Findings   Reproductive/Obstetrics negative OB ROS                          Anesthesia Physical Anesthesia Plan  ASA: III  Anesthesia Plan: General   Post-op Pain Management:    Induction: Intravenous  Airway Management Planned: LMA  Additional Equipment:   Intra-op Plan:   Post-operative Plan: Extubation in OR  Informed Consent: I have reviewed the patients History and Physical, chart, labs and discussed the procedure including the risks, benefits and alternatives for the proposed anesthesia with the patient or authorized representative who has indicated his/her understanding and acceptance.   Dental advisory given  Plan Discussed with: CRNA  Anesthesia Plan Comments:         Anesthesia Quick Evaluation

## 2013-06-29 ENCOUNTER — Encounter (HOSPITAL_COMMUNITY): Payer: Self-pay | Admitting: Plastic Surgery

## 2013-07-04 NOTE — Progress Notes (Signed)
[facility]  NAME:  [name], HELFMAN               ACCOUNT NO.:  [id number]  MEDICAL RECORD NO.:  [id number]      DATE OF BIRTH:  10-16-1945  PHYSICIAN:  Wayland Denis, DO       VISIT DATE:  07/03/2013                                  OFFICE VISIT   The patient is a 67 year old male who is here for followup on his right lower extremity ulcer from trauma.  He underwent irrigation, debridement, and ACell placement last week, and then 2 days later had VAC placed.  He is doing very well.  The ACell is incorporating well both the sheet and the powder.  We took the Adaptic off and changed the [facility], he can leave that in place and then we will change it in 1 week. He is doing very well.  No change in medications or social history.     Wayland Denis, DO     CS/MEDQ  D:  07/03/2013  T:  07/04/2013  Job:  [id number]

## 2013-07-17 ENCOUNTER — Encounter (HOSPITAL_BASED_OUTPATIENT_CLINIC_OR_DEPARTMENT_OTHER): Payer: Medicare HMO | Attending: Plastic Surgery

## 2013-07-17 DIAGNOSIS — I872 Venous insufficiency (chronic) (peripheral): Secondary | ICD-10-CM | POA: Insufficient documentation

## 2013-07-17 DIAGNOSIS — L97809 Non-pressure chronic ulcer of other part of unspecified lower leg with unspecified severity: Secondary | ICD-10-CM | POA: Insufficient documentation

## 2013-07-18 NOTE — Progress Notes (Signed)
[facility]  NAME:  [name], [name] NO.:  [id number]  MEDICAL RECORD NO.:  [id number]      DATE OF BIRTH:  02/02/1946  PHYSICIAN:  Wayland Denis, DO       VISIT DATE:  07/17/2013                                  OFFICE VISIT   [name] is a 67 year old male who is here for followup on his right lower extremity ulcers.  He underwent debridement with ACell and VAC placement and is doing much better.  He said that his pain is essentially a 0/1.  There has been no change in his medications or social history.  He is tolerating the VAC very well and he brought it with him today.  We changed the VAC, did a Adaptic with a little Hydrogel.  The ACell is nearly well incorporated.  The granulation tissue looks very good.  No sign of infection.  We will continue with the [facility] and see him back in a week.     Wayland Denis, DO     CS/MEDQ  D:  07/17/2013  T:  07/18/2013  Job:  [id number]

## 2013-07-31 NOTE — Progress Notes (Signed)
[facility]  NAME:  [name], [name] NO.:  [id number]  MEDICAL RECORD NO.:  [id number]      DATE OF BIRTH:  10/25/1945  PHYSICIAN:  Wayland Denis, DO            VISIT DATE:                                  OFFICE VISIT   The patient is a 67 year old male, who is here for followup on his right lower extremity ulcers from trauma.  He is doing much better.  He was seen last week and had swelling and redness, likely irritation from the [place]an dressing on his skin. That part has improved remarkably.  The wounds are left deep and overall looking much healthier.  We would like to put him in a wrap, Profore Lite for a few days to see if that will help with the swelling.  We also gave him another script to go get compression stockings.  Continue with protein, multivitamin, vitamin C, elevation, and hopefully we can get him back in the [facility] later this week.     Wayland Denis, DO     CS/MEDQ  D:  07/31/2013  T:  07/31/2013  Job:  [id number]

## 2013-08-14 ENCOUNTER — Encounter (HOSPITAL_BASED_OUTPATIENT_CLINIC_OR_DEPARTMENT_OTHER): Payer: Medicare HMO | Attending: Plastic Surgery

## 2013-08-14 DIAGNOSIS — L97809 Non-pressure chronic ulcer of other part of unspecified lower leg with unspecified severity: Secondary | ICD-10-CM | POA: Insufficient documentation

## 2013-08-22 NOTE — Progress Notes (Signed)
[facility]  NAME:  [name], [name]               ACCOUNT NO.:  [id number]  MEDICAL RECORD NO.:  [id number]      DATE OF BIRTH:  03/25/1946  PHYSICIAN:  Wayland Denis, DO       VISIT DATE:  08/21/2013                                  OFFICE VISIT   The patient is a 67 year old male who is here for followup on his right lower extremity ulcers from trauma, then became chronic.  He is doing very well and were starting to see some epithelialization which is markedly improved.  There is no sign of infection.  He does have a little excoriation of the surrounding periwound area, and this is likely secondary to the wrap with swelling.  There has been no changes in his medications or social history.  On exam, he is alert, oriented, cooperative, very pleasant.  Pupils are equal.  Extraocular muscles are intact.  No difficulty breathing.  Heart rate regular.  He has overgrown toenails as well.  Little bit of swelling in the leg but overall doing much better.  Recommend continue with the SilverCel, the wrap, some TCA to the excoriated areas of the periwound area, elevation, focus on his protein intake, regulate the Coumadin, bring in pressure socks for next week, and we will see him back in a week.     Wayland Denis, DO     CS/MEDQ  D:  08/21/2013  T:  08/22/2013  Job:  [id number]

## 2013-08-24 ENCOUNTER — Ambulatory Visit: Payer: Medicare HMO | Admitting: Cardiology

## 2013-09-01 NOTE — Progress Notes (Signed)
[facility]  NAME:  BRAINARD, HIGHFILL                    ACCOUNT NO.:  MEDICAL RECORD NO.:  [id number]      DATE OF BIRTH:  07-Feb-1946  PHYSICIAN:  Wayland Denis, DO       VISIT DATE:  08/28/2013                                  OFFICE VISIT   The patient is a 67 year old male who is here for followup on his right lower extremity ulcers from chronic venous insufficiency, lymphedema and originally trauma.  The ulcers are markedly improved  epithelializing much improvement in the overall depth, so we are very pleased with that. He has got some swelling and a inch of cellulitis on the lower portion of his leg that is concerning and he has lymphedema on both of his legs. There has been no change in his medication or social history.  PHYSICAL EXAMINATION:  On exam, he is alert, oriented, cooperative, not in any acute distress.  He is pleasant.  Pupils are equal.  Extraocular muscles are intact.  His breathing is unlabored and his heart rate is regular.  We recommend continuing with elevation of the leg.  We will add collagen with compression, protein, multivitamin, vitamin C, zinc, and we will add a stocking to the left leg and hopefully be able to transition to stocking on the right leg in the next few weeks.     Wayland Denis, DO     CS/MEDQ  D:  08/28/2013  T:  08/29/2013  Job:  [id number]

## 2013-09-04 NOTE — Progress Notes (Signed)
[facility]  NAME:  GURPREET, MIKHAIL NO.:  [id number]  MEDICAL RECORD NO.:  [id number]      DATE OF BIRTH:  Jul 20, 1946  PHYSICIAN:  Wayland Denis, DO       VISIT DATE:  09/04/2013                                  OFFICE VISIT   The patient is a 67 year old male who is here for followup on his right lower extremity chronic venous insufficiency, ulceration secondary to trauma.  He looks so much better today.  The wounds have completely filled in depth wise and now are epithelializing.  There has been no change in his medications or social history.  He has not started taking the boost, but he does have access to it and said that he would start to take it.  He is not wearing his socks today and he said he would bring those in next week.  REVIEW OF SYSTEMS:  Negative.  PHYSICAL EXAMINATION:  On exam, he is alert, oriented, cooperative, very pleased with his results.  His breathing is unlabored.  His heart rate is regular.  His wounds are described below and doing much better.  He still has some chronic venous insufficiency noted with hard skin and tissue of his lower leg but the wounds are healing, this is very encouraging.  Recommend continuing with the wrap, the collagen, elevation, multivitamin, vitamin C, zinc, and boost and we will see him back in a week and he is to bring his compression socks with him.     Wayland Denis, DO     CS/MEDQ  D:  09/04/2013  T:  09/04/2013  Job:  [id number]

## 2013-09-11 ENCOUNTER — Encounter (HOSPITAL_BASED_OUTPATIENT_CLINIC_OR_DEPARTMENT_OTHER): Payer: Medicare HMO | Attending: Plastic Surgery

## 2013-09-11 DIAGNOSIS — L97909 Non-pressure chronic ulcer of unspecified part of unspecified lower leg with unspecified severity: Secondary | ICD-10-CM | POA: Insufficient documentation

## 2013-09-19 NOTE — Progress Notes (Signed)
[facility]  NAME:  [name], [name] NO.:  [id number]  MEDICAL RECORD NO.:  [id number]      DATE OF BIRTH:  03-Jun-1946  PHYSICIAN:  Wayland Denis, DO            VISIT DATE:                                  OFFICE VISIT   The patient is a 67 year old gentleman, who is here for followup on his right lower extremity, chronic ulcers secondary to trauma.  He is doing extremely well and is nearly healed.  He has 1 little scab area left. He took the dressing off today and took a shower.  Overall he is doing much better.  He is taking about 1 Ensure a day.  There has been no change in medication or social history.  On exam, he is alert, oriented, cooperative, not in any acute distress.  He is very pleasant.  Pupils are equal.  Extraocular muscles are intact.  We will continue with the collagen on the upper scabbed area and wrap and we will see him back in 1 week.  Hopefully by then we can go with just a compression sock.     Wayland Denis, DO     CS/MEDQ  D:  09/18/2013  T:  09/19/2013  Job:  [id number]

## 2013-09-20 ENCOUNTER — Ambulatory Visit (INDEPENDENT_AMBULATORY_CARE_PROVIDER_SITE_OTHER): Payer: Medicare HMO | Admitting: Cardiology

## 2013-09-20 ENCOUNTER — Encounter: Payer: Self-pay | Admitting: Cardiology

## 2013-09-20 VITALS — BP 142/80 | Ht 72.0 in | Wt 263.0 lb

## 2013-09-20 DIAGNOSIS — I4891 Unspecified atrial fibrillation: Secondary | ICD-10-CM

## 2013-09-20 NOTE — Progress Notes (Signed)
HPI The patient presents for followup of his atrial fibrillation and and cardiomyopathy. His EF earlier this year was only mildly reduced.  Since I last saw him he had a mechanical fall and injured his right leg. He had a hematoma and a complicated hospitalization and rehabilitation following this. However, he's done very nicely since that time now back at home after being in a rehabilitation facility. He continues to lose weight with his illness and intentionally. He is down 110 pounds over the last 3 years!he patient denies any new symptoms such as chest discomfort, neck or arm discomfort. There has been no new shortness of breath, PND or orthopnea. There have been no reported palpitations, presyncope or syncope.  He does not feel his atrial fibrillation.    No Known Allergies  Current Outpatient Prescriptions  Medication Sig Dispense Refill  . carvedilol (COREG) 6.25 MG tablet Take 6.25 mg by mouth 2 (two) times daily with a meal.      . colchicine 0.6 MG tablet Take 0.6 mg by mouth daily.      . furosemide (LASIX) 40 MG tablet Take 1 tablet (40 mg total) by mouth daily.  30 tablet  0  . potassium chloride (K-DUR) 10 MEQ tablet Take 2 tablets (20 mEq total) by mouth 2 (two) times daily.  30 tablet  0  . pravastatin (PRAVACHOL) 10 MG tablet Take 10 mg by mouth daily.      Marland Kitchen thiamine (VITAMIN B-1) 100 MG tablet Take 100 mg by mouth daily.      . vitamin C (ASCORBIC ACID) 250 MG tablet Take 250 mg by mouth 2 (two) times daily.      Marland Kitchen warfarin (COUMADIN) 2.5 MG tablet Take 2.5 mg by mouth daily.      Marland Kitchen zinc sulfate 220 MG capsule Take 220 mg by mouth daily.       No current facility-administered medications for this visit.    Past Medical History  Diagnosis Date  . Cardiomyopathy     nonischemic  . CHF (congestive heart failure)     ejection fractio of 45% per echo in 2011 (previos EF 35-40% in 2003)  . Alcoholism   . Atrial fibrillation or flutter   . Hypertension   . Obesity   .  Ventricular tachycardia     nonsustained, asymptomatic  . Anemia   . Hypoxemia   . GERD (gastroesophageal reflux disease)     takes tums prn  . Complication of anesthesia     April 11, 2013    Past Surgical History  Procedure Laterality Date  . Tonsillectomy    . Knee bursectomy Right 04/20/2013    Procedure: Right knee prepatella bursa decompression;  Surgeon: Cammy Copa, MD;  Location: [facility] OR;  Service: Orthopedics;  Laterality: Right;  . I&d extremity Right 06/28/2013    Procedure: IRRIGATION AND DEBRIDEMENT OF RIGHT LEG WITH SURGICAL PREP/PLACEMENT OF ACELL ;  Surgeon: Wayland Denis, DO;  Location: [facility] OR;  Service: Plastics;  Laterality: Right;    ROS:  As stated in the HPI and negative for all other systems.  PHYSICAL EXAM BP 142/80  Ht 6' (1.829 m)  Wt 263 lb (119.296 kg)  BMI 35.66 kg/m2 GENERAL:  Well appearing HEENT:  Pupils equal round and reactive, fundi not visualized, oral mucosa unremarkable, disconjugate gaze NECK:  No jugular venous distention, waveform within normal limits, carotid upstroke brisk and symmetric, no bruits, no thyromegaly LYMPHATICS:  No cervical, inguinal adenopathy LUNGS:  Clear to  auscultation bilaterally BACK:  No CVA tenderness CHEST:  Unremarkable HEART:  PMI not displaced or sustained,S1 and S2 within normal limits, no S3,  no clicks, no rubs, no murmurs, irregular ABD:  Flat, positive bowel sounds normal in frequency in pitch, no bruits, no rebound, no guarding, no midline pulsatile mass, no hepatomegaly, no splenomegaly, obese EXT:  2 plus pulses throughout, no edema, no cyanosis no clubbing.  The right leg is wrapped.   EKG:  Atrial fibrillation, rate 56, anterior R-wave progression, no acute ST-T wave changes.  09/20/2013  ASSESSMENT AND PLAN  ATRIAL FIBRILLATION - The patient  tolerates this rhythm and rate control and anticoagulation. We will continue with the meds as listed.  He is doing well on warfarin and so I will not  change this.  CHF -  Mr. Wynn Alldredge seems to be euvolemic. At this point, no change in therapy is indicated. His last EF ws 50%.  HYPERTENSION -  The blood pressure is at target. No change in medications is indicated. We will continue with therapeutic lifestyle changes (TLC).

## 2013-09-26 NOTE — Progress Notes (Signed)
[facility]  NAME:  [name], [name]               ACCOUNT NO.:  [id number]  MEDICAL RECORD NO.:  [id number]      DATE OF BIRTH:  11-03-1945  PHYSICIAN:  Wayland Denis, DO       VISIT DATE:  09/25/2013                                  OFFICE VISIT   The patient is a 67 year old gentleman who is here for followup on his chronic ulcer of the right leg after trauma secondary to chronic venous insufficiency.  He is completely healed today and is doing extremely well.  He is alert, oriented, and cooperative, not in any acute distress.  There is no change in his medications or social history.  PHYSICAL EXAMINATION:  He is alert, oriented, cooperative, not in any acute distress.  His breathing is unlabored.  His heart rate is regular.  We will continue with recommending compression, elevation, multivitamin, vitamin C, zinc, protein, but he will not need to come back as he is completely healed.  If he needs anything in the future, he is welcome to give [place] a call.     Wayland Denis, DO     CS/MEDQ  D:  09/25/2013  T:  09/26/2013  Job:  [id number]

## 2015-01-07 ENCOUNTER — Ambulatory Visit: Payer: Medicare HMO | Admitting: Cardiology

## 2015-01-25 ENCOUNTER — Ambulatory Visit: Payer: Medicare HMO | Admitting: Cardiology

## 2015-02-05 ENCOUNTER — Emergency Department (HOSPITAL_COMMUNITY)
Admission: EM | Admit: 2015-02-05 | Discharge: 2015-02-05 | Disposition: A | Discharge status: Home / Self Care | Payer: Medicare HMO | Source: Home / Self Care | Attending: Emergency Medicine | Admitting: Emergency Medicine

## 2015-02-05 ENCOUNTER — Encounter (HOSPITAL_COMMUNITY): Payer: Self-pay | Admitting: *Deleted

## 2015-02-05 ENCOUNTER — Emergency Department (HOSPITAL_COMMUNITY): Payer: Medicare HMO

## 2015-02-05 DIAGNOSIS — Z792 Long term (current) use of antibiotics: Secondary | ICD-10-CM | POA: Insufficient documentation

## 2015-02-05 DIAGNOSIS — I10 Essential (primary) hypertension: Secondary | ICD-10-CM

## 2015-02-05 DIAGNOSIS — N189 Chronic kidney disease, unspecified: Secondary | ICD-10-CM | POA: Diagnosis present

## 2015-02-05 DIAGNOSIS — I4891 Unspecified atrial fibrillation: Secondary | ICD-10-CM

## 2015-02-05 DIAGNOSIS — I272 Other secondary pulmonary hypertension: Secondary | ICD-10-CM | POA: Diagnosis present

## 2015-02-05 DIAGNOSIS — Z716 Tobacco abuse counseling: Secondary | ICD-10-CM | POA: Diagnosis present

## 2015-02-05 DIAGNOSIS — Z87891 Personal history of nicotine dependence: Secondary | ICD-10-CM | POA: Insufficient documentation

## 2015-02-05 DIAGNOSIS — R6520 Severe sepsis without septic shock: Secondary | ICD-10-CM | POA: Diagnosis present

## 2015-02-05 DIAGNOSIS — D649 Anemia, unspecified: Secondary | ICD-10-CM | POA: Insufficient documentation

## 2015-02-05 DIAGNOSIS — Y998 Other external cause status: Secondary | ICD-10-CM | POA: Insufficient documentation

## 2015-02-05 DIAGNOSIS — Z79899 Other long term (current) drug therapy: Secondary | ICD-10-CM

## 2015-02-05 DIAGNOSIS — Z7901 Long term (current) use of anticoagulants: Secondary | ICD-10-CM

## 2015-02-05 DIAGNOSIS — I878 Other specified disorders of veins: Secondary | ICD-10-CM | POA: Diagnosis present

## 2015-02-05 DIAGNOSIS — R296 Repeated falls: Secondary | ICD-10-CM | POA: Diagnosis present

## 2015-02-05 DIAGNOSIS — A419 Sepsis, unspecified organism: Principal | ICD-10-CM | POA: Diagnosis present

## 2015-02-05 DIAGNOSIS — K219 Gastro-esophageal reflux disease without esophagitis: Secondary | ICD-10-CM

## 2015-02-05 DIAGNOSIS — I129 Hypertensive chronic kidney disease with stage 1 through stage 4 chronic kidney disease, or unspecified chronic kidney disease: Secondary | ICD-10-CM | POA: Diagnosis present

## 2015-02-05 DIAGNOSIS — Y9389 Activity, other specified: Secondary | ICD-10-CM

## 2015-02-05 DIAGNOSIS — Z7952 Long term (current) use of systemic steroids: Secondary | ICD-10-CM | POA: Insufficient documentation

## 2015-02-05 DIAGNOSIS — T148XXA Other injury of unspecified body region, initial encounter: Secondary | ICD-10-CM

## 2015-02-05 DIAGNOSIS — W010XXA Fall on same level from slipping, tripping and stumbling without subsequent striking against object, initial encounter: Secondary | ICD-10-CM | POA: Diagnosis present

## 2015-02-05 DIAGNOSIS — Y9289 Other specified places as the place of occurrence of the external cause: Secondary | ICD-10-CM

## 2015-02-05 DIAGNOSIS — D6959 Other secondary thrombocytopenia: Secondary | ICD-10-CM | POA: Diagnosis present

## 2015-02-05 DIAGNOSIS — I482 Chronic atrial fibrillation: Secondary | ICD-10-CM | POA: Diagnosis present

## 2015-02-05 DIAGNOSIS — I5033 Acute on chronic diastolic (congestive) heart failure: Secondary | ICD-10-CM | POA: Diagnosis present

## 2015-02-05 DIAGNOSIS — L309 Dermatitis, unspecified: Secondary | ICD-10-CM | POA: Diagnosis present

## 2015-02-05 DIAGNOSIS — I872 Venous insufficiency (chronic) (peripheral): Secondary | ICD-10-CM | POA: Insufficient documentation

## 2015-02-05 DIAGNOSIS — I499 Cardiac arrhythmia, unspecified: Secondary | ICD-10-CM

## 2015-02-05 DIAGNOSIS — B954 Other streptococcus as the cause of diseases classified elsewhere: Secondary | ICD-10-CM | POA: Diagnosis present

## 2015-02-05 DIAGNOSIS — L03116 Cellulitis of left lower limb: Secondary | ICD-10-CM | POA: Diagnosis present

## 2015-02-05 DIAGNOSIS — F1010 Alcohol abuse, uncomplicated: Secondary | ICD-10-CM | POA: Diagnosis present

## 2015-02-05 DIAGNOSIS — W01198A Fall on same level from slipping, tripping and stumbling with subsequent striking against other object, initial encounter: Secondary | ICD-10-CM

## 2015-02-05 DIAGNOSIS — I255 Ischemic cardiomyopathy: Secondary | ICD-10-CM | POA: Diagnosis present

## 2015-02-05 DIAGNOSIS — I509 Heart failure, unspecified: Secondary | ICD-10-CM

## 2015-02-05 DIAGNOSIS — K7010 Alcoholic hepatitis without ascites: Secondary | ICD-10-CM | POA: Diagnosis present

## 2015-02-05 DIAGNOSIS — L03115 Cellulitis of right lower limb: Secondary | ICD-10-CM | POA: Diagnosis present

## 2015-02-05 DIAGNOSIS — I214 Non-ST elevation (NSTEMI) myocardial infarction: Secondary | ICD-10-CM | POA: Diagnosis present

## 2015-02-05 DIAGNOSIS — I452 Bifascicular block: Secondary | ICD-10-CM | POA: Diagnosis present

## 2015-02-05 DIAGNOSIS — D638 Anemia in other chronic diseases classified elsewhere: Secondary | ICD-10-CM | POA: Diagnosis present

## 2015-02-05 DIAGNOSIS — E785 Hyperlipidemia, unspecified: Secondary | ICD-10-CM | POA: Diagnosis present

## 2015-02-05 DIAGNOSIS — S8012XA Contusion of left lower leg, initial encounter: Secondary | ICD-10-CM

## 2015-02-05 DIAGNOSIS — E875 Hyperkalemia: Secondary | ICD-10-CM | POA: Diagnosis not present

## 2015-02-05 DIAGNOSIS — Z6841 Body Mass Index (BMI) 40.0 and over, adult: Secondary | ICD-10-CM

## 2015-02-05 DIAGNOSIS — J9601 Acute respiratory failure with hypoxia: Secondary | ICD-10-CM | POA: Diagnosis present

## 2015-02-05 DIAGNOSIS — N179 Acute kidney failure, unspecified: Secondary | ICD-10-CM | POA: Diagnosis present

## 2015-02-05 DIAGNOSIS — I4510 Unspecified right bundle-branch block: Secondary | ICD-10-CM | POA: Diagnosis present

## 2015-02-05 LAB — CBC WITH DIFFERENTIAL/PLATELET
BASOS ABS: 0 10*3/uL (ref 0.0–0.1)
BASOS PCT: 0 % (ref 0–1)
EOS ABS: 0 10*3/uL (ref 0.0–0.7)
Eosinophils Relative: 1 % (ref 0–5)
HCT: 39.4 % (ref 39.0–52.0)
Hemoglobin: 12.6 g/dL — ABNORMAL LOW (ref 13.0–17.0)
LYMPHS ABS: 0.8 10*3/uL (ref 0.7–4.0)
Lymphocytes Relative: 11 % — ABNORMAL LOW (ref 12–46)
MCH: 34.3 pg — AB (ref 26.0–34.0)
MCHC: 32 g/dL (ref 30.0–36.0)
MCV: 107.4 fL — ABNORMAL HIGH (ref 78.0–100.0)
MONO ABS: 0.7 10*3/uL (ref 0.1–1.0)
Monocytes Relative: 9 % (ref 3–12)
NEUTROS PCT: 79 % — AB (ref 43–77)
Neutro Abs: 5.9 10*3/uL (ref 1.7–7.7)
Platelets: 140 10*3/uL — ABNORMAL LOW (ref 150–400)
RBC: 3.67 MIL/uL — ABNORMAL LOW (ref 4.22–5.81)
RDW: 15.2 % (ref 11.5–15.5)
WBC: 7.4 10*3/uL (ref 4.0–10.5)

## 2015-02-05 LAB — PROTIME-INR
INR: 2.16 — ABNORMAL HIGH (ref 0.00–1.49)
Prothrombin Time: 24.3 seconds — ABNORMAL HIGH (ref 11.6–15.2)

## 2015-02-05 LAB — COMPREHENSIVE METABOLIC PANEL
ALT: 16 U/L (ref 0–53)
AST: 27 U/L (ref 0–37)
Albumin: 3.7 g/dL (ref 3.5–5.2)
Alkaline Phosphatase: 150 U/L — ABNORMAL HIGH (ref 39–117)
Anion gap: 8 (ref 5–15)
BILIRUBIN TOTAL: 4.1 mg/dL — AB (ref 0.3–1.2)
BUN: 22 mg/dL (ref 6–23)
CO2: 25 mmol/L (ref 19–32)
CREATININE: 0.9 mg/dL (ref 0.50–1.35)
Calcium: 8.9 mg/dL (ref 8.4–10.5)
Chloride: 109 mmol/L (ref 96–112)
GFR calc Af Amer: >90 mL/min (ref >90)
GFR, EST NON AFRICAN AMERICAN: 86 mL/min — AB (ref >90)
GLUCOSE: 89 mg/dL (ref 70–99)
Potassium: 4.7 mmol/L (ref 3.5–5.1)
Sodium: 142 mmol/L (ref 135–145)
Total Protein: 7.6 g/dL (ref 6.0–8.3)

## 2015-02-05 MED ORDER — HYDROMORPHONE HCL 1 MG/ML IJ SOLN
1.0000 mg | Freq: Once | INTRAMUSCULAR | Status: AC
  Administered 2015-02-05: 1 mg via INTRAMUSCULAR
  Filled 2015-02-05: qty 1

## 2015-02-05 MED ORDER — HYDROCODONE-ACETAMINOPHEN 5-325 MG PO TABS: 1.0000 | ORAL_TABLET | ORAL | Status: DC | PRN

## 2015-02-05 MED ORDER — OXYCODONE HCL 5 MG PO TABS
10.0000 mg | ORAL_TABLET | Freq: Once | ORAL | Status: AC
  Administered 2015-02-05: 10 mg via ORAL
  Filled 2015-02-05: qty 2

## 2015-02-05 MED ORDER — ONDANSETRON 4 MG PO TBDP
4.0000 mg | ORAL_TABLET | Freq: Once | ORAL | Status: AC
  Administered 2015-02-05: 4 mg via ORAL
  Filled 2015-02-05: qty 1

## 2015-02-05 NOTE — ED Notes (Signed)
Patient fell up against a brick step a few days ago. He states that the leg was already swollen and now it's painful and red. He states he falls frequently and he lives at home with his elderly father. He's rating this pain 8 out 10. He states that he is able to ambulate with a walker.

## 2015-02-05 NOTE — ED Provider Notes (Signed)
  Face-to-face evaluation   History: Injury, left leg in fall several days ago. No bleeding at that time. He is concerned about pain and swelling at site.  Physical exam: Morbidly obese, alert, calm, cooperative. Left lower leg has a approximately 15 x 15 x 8 cm raised area with ecchymosis, and mild erythema. There is no associated proximal streaking. The area is tender to palpation. His neurovascular intact distally in the left foot.  Assessment:- Contusion aggravated by anticoagulated state. Doubt cellulitis, abscess, compartment syndrome. He is stable for discharge with symptomatic treatment.  Medical screening examination/treatment/procedure(s) were conducted as a shared visit with non-physician practitioner(s) and myself.  I personally evaluated the patient during the encounter  Mancel Bale, MD 02/05/15 [id number]

## 2015-02-05 NOTE — ED Notes (Signed)
Patient is not in room I will collect labs when they return.

## 2015-02-05 NOTE — ED Notes (Signed)
Patient

## 2015-02-05 NOTE — ED Provider Notes (Signed)
CSN: [id number]     Arrival date & time 02/05/15  1512 History   First MD Initiated Contact with Patient 02/05/15 1717     Chief Complaint  Patient presents with  . Leg Swelling  . Leg Pain     (Consider location/radiation/quality/duration/timing/severity/associated sxs/prior Treatment) HPI    PCP: SPEAR, TAMMY, MD Blood pressure 135/76, pulse 138, temperature 98 F (36.7 C), temperature source Oral, resp. rate 14, height 6' (1.829 m), weight 320 lb (145.151 kg), SpO2 93 %.  [name] is a 69 y.o.male with a significant PMH of cardiomyopathy, CHF, alcoholism, a. Fib, obesity, hypertension, hx of V. Tach, anemia, hypoxia, GERD presents to the ER with complaints of evaluation of his left lower extremity. He reports this past Saturday he fell hitting his tib-fib on the brick wall. He had pain at that time but it subsided and he was able to ambulate using his walker. He did not have any pain Sunday and Monday but this morning he will cough with pain is significant swelling to the lower leg. He does have chronic redness and swelling to his lower legs but this is much worse than baseline. He takes Coumadin atrial fibrillation and says his numbers are typically therapeutic.  The patient has not had any fevers, nausea, vomiting, diarrhea. He has had some mild back pain from the fall but reports it is improved over the last few days. The patient says besides this fall he has been doing well at home with his dad.  Past Medical History  Diagnosis Date  . Cardiomyopathy     nonischemic  . CHF (congestive heart failure)     ejection fractio of 45% per echo in 2011 (previos EF 35-40% in 2003)  . Alcoholism   . Atrial fibrillation or flutter   . Hypertension   . Obesity   . Ventricular tachycardia     nonsustained, asymptomatic  . Anemia   . Hypoxemia   . GERD (gastroesophageal reflux disease)     takes tums prn  . Complication of anesthesia     April 11, 2013   Past Surgical History   Procedure Laterality Date  . Tonsillectomy    . Knee bursectomy Right 04/20/2013    Procedure: Right knee prepatella bursa decompression;  Surgeon: Cammy Copa, MD;  Location: [facility] OR;  Service: Orthopedics;  Laterality: Right;  . I&d extremity Right 06/28/2013    Procedure: IRRIGATION AND DEBRIDEMENT OF RIGHT LEG WITH SURGICAL PREP/PLACEMENT OF ACELL ;  Surgeon: Wayland Denis, DO;  Location: [facility] OR;  Service: Plastics;  Laterality: Right;   Family History  Problem Relation Age of Onset  . COPD Mother   . Hypertension Father    History  Substance Use Topics  . Smoking status: Former Smoker    Start date: 06/29/1987  . Smokeless tobacco: Never Used     Comment: stopped in 1985  . Alcohol Use: 6.0 oz/week    10 Cans of beer per week    Review of Systems  10 Systems reviewed and are negative for acute change except as noted in the HPI.    Allergies  Review of patient's allergies indicates no known allergies.  Home Medications   Prior to Admission medications   Medication Sig Start Date End Date Taking? Authorizing Provider  carvedilol (COREG) 6.25 MG tablet Take 6.25 mg by mouth 2 (two) times daily with a meal.   Yes Historical Provider, MD  colchicine 0.6 MG tablet Take 0.6 mg by mouth daily.  colcrys   Yes Historical Provider, MD  ferrous sulfate 325 (65 FE) MG tablet Take 1 tablet by mouth daily.   Yes Historical Provider, MD  furosemide (LASIX) 40 MG tablet Take 1 tablet (40 mg total) by mouth daily. 04/25/13  Yes Alison Murray, MD  neomycin-bacitracin-polymyxin (NEOSPORIN) ointment Apply 1 application topically daily. apply to eye   Yes Historical Provider, MD  olmesartan (BENICAR) 40 MG tablet Take 40 mg by mouth daily.   Yes Historical Provider, MD  potassium chloride SA (K-DUR,KLOR-CON) 20 MEQ tablet Take 1 tablet by mouth 2 (two) times daily. 01/08/15  Yes Historical Provider, MD  pravastatin (PRAVACHOL) 10 MG tablet Take 10 mg by mouth daily.   Yes Historical  Provider, MD  prednisoLONE acetate (PRED FORTE) 1 % ophthalmic suspension Place 1 drop into the right eye 2 (two) times daily. 01/28/15 01/28/16 Yes Historical Provider, MD  timolol (TIMOPTIC) 0.5 % ophthalmic solution Place 1 drop into the right eye 2 (two) times daily. 10/17/14 10/17/15 Yes Historical Provider, MD  vitamin C (ASCORBIC ACID) 250 MG tablet Take 250 mg by mouth 2 (two) times daily.   Yes Historical Provider, MD  warfarin (COUMADIN) 5 MG tablet Take 2.5-5 mg by mouth daily. Takes a full tablet on MWF and half of a tablet (2.5mg ) on all other days. 11/15/14  Yes Historical Provider, MD  potassium chloride (K-DUR) 10 MEQ tablet Take 2 tablets (20 mEq total) by mouth 2 (two) times daily. Patient not taking: Reported on 02/05/2015 04/25/13   Alison Murray, MD   BP 159/70 mmHg  Pulse 76  Temp(Src) 98 F (36.7 C) (Oral)  Resp 22  Ht 6' (1.829 m)  Wt 320 lb (145.151 kg)  BMI 43.39 kg/m2  SpO2 93% Physical Exam  Constitutional: He appears well-developed and well-nourished. No distress.  + morbid obesity  HENT:  Head: Normocephalic and atraumatic.  Eyes: Pupils are equal, round, and reactive to light.  Neck: Normal range of motion. Neck supple.  Cardiovascular: Normal rate.  An irregularly irregular rhythm present.  Pulmonary/Chest: Effort normal.  Abdominal: Soft.  Musculoskeletal:  Significant venous stasis to bilateral lower extremities. Patient has significant swelling, firmness to the lateral tib-fib on the left side. His capillary refill to his toes are less than 3 seconds. Pedal pulses palpable and symmetrical to the right side. Patient reports intact sensation to touch. His significant associated erythema.  Neurological: He is alert.  Skin: Skin is warm and dry.  Nursing note and vitals reviewed.   ED Course  Procedures (including critical care time) Labs Review Labs Reviewed  CBC WITH DIFFERENTIAL/PLATELET - Abnormal; Notable for the following:    RBC 3.67 (*)     Hemoglobin 12.6 (*)    MCV 107.4 (*)    MCH 34.3 (*)    Platelets 140 (*)    Neutrophils Relative % 79 (*)    Lymphocytes Relative 11 (*)    All other components within normal limits  COMPREHENSIVE METABOLIC PANEL - Abnormal; Notable for the following:    Alkaline Phosphatase 150 (*)    Total Bilirubin 4.1 (*)    GFR calc non Af Amer 86 (*)    All other components within normal limits  PROTIME-INR - Abnormal; Notable for the following:    Prothrombin Time 24.3 (*)    INR 2.16 (*)    All other components within normal limits    Imaging Review Dg Tibia/fibula Left  02/05/2015   CLINICAL DATA:  Left leg pain  and swelling  EXAM: LEFT TIBIA AND FIBULA - 2 VIEW  COMPARISON:  None.  FINDINGS: There is no evidence of fracture or other focal bone lesions. Soft tissues are unremarkable.  IMPRESSION: No acute osseous injury of the left tibia or fibula.   Electronically Signed   By: Elige Ko   On: 02/05/2015 18:54     EKG Interpretation   Date/Time:  Tuesday February 05 2015 18:02:16 EDT Ventricular Rate:  71 PR Interval:    QRS Duration: 124 QT Interval:  418 QTC Calculation: 454 R Axis:   -154 Text Interpretation:  Atrial fibrillation Nonspecific intraventricular  conduction delay Anterior infarct, old No significant change since last  tracing Confirmed by POLLINA  MD, CHRISTOPHER [phone]) on 02/05/2015 7:52:06  PM      MDM   Final diagnoses:  Multiple leg contusions, left, initial encounter  Hematoma    Assessment:- Contusion aggravated by anticoagulated state. Doubt cellulitis, abscess, compartment syndrome. He is stable for discharge with symptomatic treatment.  Patient will need arm compresses and take close attention to signs and symptoms of infection. Otherwise he can follow-up with his primary care doctor as needed. Recommend Tylenol for pain control. Should seen by Dr. Effie Shy and agrees with my assessment and plan.  69 y.o.Helen Hashimoto Etheridge's evaluation in the Emergency  Department is complete. It has been determined that no acute conditions requiring further emergency intervention are present at this time. The patient/guardian have been advised of the diagnosis and plan. We have discussed signs and symptoms that warrant return to the ED, such as changes or worsening in symptoms.  Vital signs are stable at discharge. Filed Vitals:   02/05/15 1802  BP: 159/70  Pulse: 76  Temp:   Resp: 22    Patient/guardian has voiced understanding and agreed to follow-up with the PCP or specialist.     Marlon Pel, PA-C 02/05/15 2019  Mancel Bale, MD 02/05/15 [phone]

## 2015-02-05 NOTE — Discharge Instructions (Signed)
Contusion °A contusion is a deep bruise. Contusions are the result of an injury that caused bleeding under the skin. The contusion may turn blue, purple, or yellow. Minor injuries will give you a painless contusion, but more severe contusions may stay painful and swollen for a few weeks.  °CAUSES  °A contusion is usually caused by a blow, trauma, or direct force to an area of the body. °SYMPTOMS  °· Swelling and redness of the injured area. °· Bruising of the injured area. °· Tenderness and soreness of the injured area. °· Pain. °DIAGNOSIS  °The diagnosis can be made by taking a history and physical exam. An X-ray, CT scan, or MRI may be needed to determine if there were any associated injuries, such as fractures. °TREATMENT  °Specific treatment will depend on what area of the body was injured. In general, the best treatment for a contusion is resting, icing, elevating, and applying cold compresses to the injured area. Over-the-counter medicines may also be recommended for pain control. Ask your caregiver what the best treatment is for your contusion. °HOME CARE INSTRUCTIONS  °· Put ice on the injured area. °¨ Put ice in a plastic bag. °¨ Place a towel between your skin and the bag. °¨ Leave the ice on for 15-20 minutes, 3-4 times a day, or as directed by your health care provider. °· Only take over-the-counter or prescription medicines for pain, discomfort, or fever as directed by your caregiver. Your caregiver may recommend avoiding anti-inflammatory medicines (aspirin, ibuprofen, and naproxen) for 48 hours because these medicines may increase bruising. °· Rest the injured area. °· If possible, elevate the injured area to reduce swelling. °SEEK IMMEDIATE MEDICAL CARE IF:  °· You have increased bruising or swelling. °· You have pain that is getting worse. °· Your swelling or pain is not relieved with medicines. °MAKE SURE YOU:  °· Understand these instructions. °· Will watch your condition. °· Will get help right  away if you are not doing well or get worse. °Document Released: 07/08/2005 Document Revised: 10/03/2013 Document Reviewed: 08/03/2011 °ExitCare® Patient Information ©2015 [employer]. This information is not intended to replace advice given to you by your health care provider. Make sure you discuss any questions you have with your health care provider. °Hematoma °A hematoma is a collection of blood under the skin, in an organ, in a body space, in a joint space, or in other tissue. The blood can clot to form a lump that you can see and feel. The lump is often firm and may sometimes become sore and tender. Most hematomas get better in a few days to weeks. However, some hematomas may be serious and require medical care. Hematomas can range in size from very small to very large. °CAUSES  °A hematoma can be caused by a blunt or penetrating injury. It can also be caused by spontaneous leakage from a blood vessel under the skin. Spontaneous leakage from a blood vessel is more likely to occur in older people, especially those taking blood thinners. Sometimes, a hematoma can develop after certain medical procedures. °SIGNS AND SYMPTOMS  °· A firm lump on the body. °· Possible pain and tenderness in the area. °· Bruising. Blue, dark blue, purple-red, or yellowish skin may appear at the site of the hematoma if the hematoma is close to the surface of the skin. °For hematomas in deeper tissues or body spaces, the signs and symptoms may be subtle. For example, an intra-abdominal hematoma may cause abdominal pain, weakness, fainting, and   shortness of breath. An intracranial hematoma may cause a headache or symptoms such as weakness, trouble speaking, or a change in consciousness. °DIAGNOSIS  °A hematoma can usually be diagnosed based on your medical history and a physical exam. Imaging tests may be needed if your health care provider suspects a hematoma in deeper tissues or body spaces, such as the abdomen, head, or chest.  These tests may include ultrasonography or a CT scan.  °TREATMENT  °Hematomas usually go away on their own over time. Rarely does the blood need to be drained out of the body. Large hematomas or those that may affect vital organs will sometimes need surgical drainage or monitoring. °HOME CARE INSTRUCTIONS  °· Apply ice to the injured area:   °¨ Put ice in a plastic bag.   °¨ Place a towel between your skin and the bag.   °¨ Leave the ice on for 20 minutes, 2-3 times a day for the first 1 to 2 days.   °· After the first 2 days, switch to using warm compresses on the hematoma.   °· Elevate the injured area to help decrease pain and swelling. Wrapping the area with an elastic bandage may also be helpful. Compression helps to reduce swelling and promotes shrinking of the hematoma. Make sure the bandage is not wrapped too tight.   °· If your hematoma is on a lower extremity and is painful, crutches may be helpful for a couple days.   °· Only take over-the-counter or prescription medicines as directed by your health care provider. °SEEK IMMEDIATE MEDICAL CARE IF:  °· You have increasing pain, or your pain is not controlled with medicine.   °· You have a fever.   °· You have worsening swelling or discoloration.   °· Your skin over the hematoma breaks or starts bleeding.   °· Your hematoma is in your chest or abdomen and you have weakness, shortness of breath, or a change in consciousness. °· Your hematoma is on your scalp (caused by a fall or injury) and you have a worsening headache or a change in alertness or consciousness. °MAKE SURE YOU:  °· Understand these instructions. °· Will watch your condition. °· Will get help right away if you are not doing well or get worse. °Document Released: 05/12/2004 Document Revised: 05/31/2013 Document Reviewed: 03/08/2013 °ExitCare® Patient Information ©2015 [employer]. This information is not intended to replace advice given to you by your health care provider. Make sure you  discuss any questions you have with your health care provider. ° °

## 2015-02-06 ENCOUNTER — Encounter (HOSPITAL_COMMUNITY): Payer: Self-pay | Admitting: Emergency Medicine

## 2015-02-06 ENCOUNTER — Inpatient Hospital Stay (HOSPITAL_COMMUNITY): Payer: Medicare HMO

## 2015-02-06 ENCOUNTER — Inpatient Hospital Stay (HOSPITAL_COMMUNITY)
Admission: EM | Admit: 2015-02-06 | Discharge: 2015-02-13 | DRG: 871 | Disposition: A | Discharge status: Skilled Nursing Facility | Payer: Medicare HMO | Attending: Internal Medicine | Admitting: Internal Medicine

## 2015-02-06 ENCOUNTER — Emergency Department (HOSPITAL_COMMUNITY): Payer: Medicare HMO

## 2015-02-06 DIAGNOSIS — R7881 Bacteremia: Secondary | ICD-10-CM | POA: Diagnosis not present

## 2015-02-06 DIAGNOSIS — A408 Other streptococcal sepsis: Secondary | ICD-10-CM | POA: Diagnosis not present

## 2015-02-06 DIAGNOSIS — I129 Hypertensive chronic kidney disease with stage 1 through stage 4 chronic kidney disease, or unspecified chronic kidney disease: Secondary | ICD-10-CM | POA: Diagnosis present

## 2015-02-06 DIAGNOSIS — R0603 Acute respiratory distress: Secondary | ICD-10-CM | POA: Diagnosis present

## 2015-02-06 DIAGNOSIS — Z7901 Long term (current) use of anticoagulants: Secondary | ICD-10-CM | POA: Diagnosis not present

## 2015-02-06 DIAGNOSIS — R748 Abnormal levels of other serum enzymes: Secondary | ICD-10-CM | POA: Diagnosis not present

## 2015-02-06 DIAGNOSIS — I452 Bifascicular block: Secondary | ICD-10-CM | POA: Diagnosis present

## 2015-02-06 DIAGNOSIS — I272 Other secondary pulmonary hypertension: Secondary | ICD-10-CM | POA: Diagnosis present

## 2015-02-06 DIAGNOSIS — R7989 Other specified abnormal findings of blood chemistry: Secondary | ICD-10-CM | POA: Diagnosis not present

## 2015-02-06 DIAGNOSIS — Z716 Tobacco abuse counseling: Secondary | ICD-10-CM | POA: Diagnosis present

## 2015-02-06 DIAGNOSIS — E785 Hyperlipidemia, unspecified: Secondary | ICD-10-CM | POA: Diagnosis present

## 2015-02-06 DIAGNOSIS — D6959 Other secondary thrombocytopenia: Secondary | ICD-10-CM | POA: Diagnosis present

## 2015-02-06 DIAGNOSIS — N189 Chronic kidney disease, unspecified: Secondary | ICD-10-CM | POA: Diagnosis present

## 2015-02-06 DIAGNOSIS — I10 Essential (primary) hypertension: Secondary | ICD-10-CM | POA: Diagnosis present

## 2015-02-06 DIAGNOSIS — L309 Dermatitis, unspecified: Secondary | ICD-10-CM | POA: Diagnosis present

## 2015-02-06 DIAGNOSIS — A491 Streptococcal infection, unspecified site: Secondary | ICD-10-CM | POA: Diagnosis present

## 2015-02-06 DIAGNOSIS — J9601 Acute respiratory failure with hypoxia: Secondary | ICD-10-CM | POA: Diagnosis present

## 2015-02-06 DIAGNOSIS — R601 Generalized edema: Secondary | ICD-10-CM | POA: Diagnosis not present

## 2015-02-06 DIAGNOSIS — L0390 Cellulitis, unspecified: Secondary | ICD-10-CM | POA: Diagnosis present

## 2015-02-06 DIAGNOSIS — R0602 Shortness of breath: Secondary | ICD-10-CM

## 2015-02-06 DIAGNOSIS — W010XXA Fall on same level from slipping, tripping and stumbling without subsequent striking against object, initial encounter: Secondary | ICD-10-CM | POA: Diagnosis present

## 2015-02-06 DIAGNOSIS — L03119 Cellulitis of unspecified part of limb: Secondary | ICD-10-CM | POA: Diagnosis not present

## 2015-02-06 DIAGNOSIS — N179 Acute kidney failure, unspecified: Secondary | ICD-10-CM | POA: Diagnosis present

## 2015-02-06 DIAGNOSIS — K7010 Alcoholic hepatitis without ascites: Secondary | ICD-10-CM | POA: Diagnosis present

## 2015-02-06 DIAGNOSIS — A419 Sepsis, unspecified organism: Secondary | ICD-10-CM | POA: Diagnosis present

## 2015-02-06 DIAGNOSIS — D696 Thrombocytopenia, unspecified: Secondary | ICD-10-CM | POA: Diagnosis present

## 2015-02-06 DIAGNOSIS — F1010 Alcohol abuse, uncomplicated: Secondary | ICD-10-CM | POA: Diagnosis present

## 2015-02-06 DIAGNOSIS — I4510 Unspecified right bundle-branch block: Secondary | ICD-10-CM | POA: Diagnosis present

## 2015-02-06 DIAGNOSIS — B954 Other streptococcus as the cause of diseases classified elsewhere: Secondary | ICD-10-CM | POA: Diagnosis present

## 2015-02-06 DIAGNOSIS — I517 Cardiomegaly: Secondary | ICD-10-CM | POA: Diagnosis present

## 2015-02-06 DIAGNOSIS — Z87891 Personal history of nicotine dependence: Secondary | ICD-10-CM | POA: Diagnosis not present

## 2015-02-06 DIAGNOSIS — E875 Hyperkalemia: Secondary | ICD-10-CM

## 2015-02-06 DIAGNOSIS — I214 Non-ST elevation (NSTEMI) myocardial infarction: Secondary | ICD-10-CM | POA: Diagnosis present

## 2015-02-06 DIAGNOSIS — Z6841 Body Mass Index (BMI) 40.0 and over, adult: Secondary | ICD-10-CM | POA: Diagnosis not present

## 2015-02-06 DIAGNOSIS — D638 Anemia in other chronic diseases classified elsewhere: Secondary | ICD-10-CM | POA: Diagnosis present

## 2015-02-06 DIAGNOSIS — L03116 Cellulitis of left lower limb: Secondary | ICD-10-CM | POA: Diagnosis present

## 2015-02-06 DIAGNOSIS — I4891 Unspecified atrial fibrillation: Secondary | ICD-10-CM | POA: Diagnosis not present

## 2015-02-06 DIAGNOSIS — L03115 Cellulitis of right lower limb: Secondary | ICD-10-CM | POA: Diagnosis present

## 2015-02-06 DIAGNOSIS — I5033 Acute on chronic diastolic (congestive) heart failure: Secondary | ICD-10-CM | POA: Diagnosis present

## 2015-02-06 DIAGNOSIS — I482 Chronic atrial fibrillation, unspecified: Secondary | ICD-10-CM | POA: Diagnosis present

## 2015-02-06 DIAGNOSIS — I255 Ischemic cardiomyopathy: Secondary | ICD-10-CM | POA: Diagnosis present

## 2015-02-06 DIAGNOSIS — R296 Repeated falls: Secondary | ICD-10-CM | POA: Diagnosis present

## 2015-02-06 DIAGNOSIS — T148XXA Other injury of unspecified body region, initial encounter: Secondary | ICD-10-CM

## 2015-02-06 DIAGNOSIS — I481 Persistent atrial fibrillation: Secondary | ICD-10-CM | POA: Diagnosis not present

## 2015-02-06 DIAGNOSIS — R6520 Severe sepsis without septic shock: Secondary | ICD-10-CM | POA: Diagnosis present

## 2015-02-06 DIAGNOSIS — D649 Anemia, unspecified: Secondary | ICD-10-CM | POA: Diagnosis present

## 2015-02-06 DIAGNOSIS — K219 Gastro-esophageal reflux disease without esophagitis: Secondary | ICD-10-CM | POA: Diagnosis present

## 2015-02-06 DIAGNOSIS — R778 Other specified abnormalities of plasma proteins: Secondary | ICD-10-CM | POA: Diagnosis present

## 2015-02-06 DIAGNOSIS — I878 Other specified disorders of veins: Secondary | ICD-10-CM | POA: Diagnosis present

## 2015-02-06 LAB — COMPREHENSIVE METABOLIC PANEL
ALBUMIN: 3.6 g/dL (ref 3.5–5.2)
ALK PHOS: 149 U/L — AB (ref 39–117)
ALT: 212 U/L — AB (ref 0–53)
ALT: 209 U/L — ABNORMAL HIGH (ref 0–53)
AST: 470 U/L — AB (ref 0–37)
AST: 490 U/L — ABNORMAL HIGH (ref 0–37)
Albumin: 3.6 g/dL (ref 3.5–5.2)
Alkaline Phosphatase: 147 U/L — ABNORMAL HIGH (ref 39–117)
Anion gap: 10 (ref 5–15)
Anion gap: 10 (ref 5–15)
BILIRUBIN TOTAL: 5.2 mg/dL — AB (ref 0.3–1.2)
BUN: 32 mg/dL — ABNORMAL HIGH (ref 6–23)
BUN: 39 mg/dL — AB (ref 6–23)
CALCIUM: 8.5 mg/dL (ref 8.4–10.5)
CHLORIDE: 109 mmol/L (ref 96–112)
CO2: 21 mmol/L (ref 19–32)
CO2: 24 mmol/L (ref 19–32)
Calcium: 8.4 mg/dL (ref 8.4–10.5)
Chloride: 109 mmol/L (ref 96–112)
Creatinine, Ser: 1.65 mg/dL — ABNORMAL HIGH (ref 0.50–1.35)
Creatinine, Ser: 1.78 mg/dL — ABNORMAL HIGH (ref 0.50–1.35)
GFR calc Af Amer: 48 mL/min — ABNORMAL LOW (ref >90)
GFR calc Af Amer: 44 mL/min — ABNORMAL LOW (ref >90)
GFR calc non Af Amer: 38 mL/min — ABNORMAL LOW (ref >90)
GFR, EST NON AFRICAN AMERICAN: 41 mL/min — AB (ref >90)
GLUCOSE: 94 mg/dL (ref 70–99)
GLUCOSE: 97 mg/dL (ref 70–99)
Potassium: 5.6 mmol/L — ABNORMAL HIGH (ref 3.5–5.1)
Potassium: 5.4 mmol/L — ABNORMAL HIGH (ref 3.5–5.1)
SODIUM: 140 mmol/L (ref 135–145)
Sodium: 143 mmol/L (ref 135–145)
Total Bilirubin: 4.4 mg/dL — ABNORMAL HIGH (ref 0.3–1.2)
Total Protein: 7.2 g/dL (ref 6.0–8.3)
Total Protein: 7.2 g/dL (ref 6.0–8.3)

## 2015-02-06 LAB — URINE MICROSCOPIC-ADD ON

## 2015-02-06 LAB — URINALYSIS, ROUTINE W REFLEX MICROSCOPIC
BILIRUBIN URINE: NEGATIVE
Glucose, UA: NEGATIVE mg/dL
KETONES UR: NEGATIVE mg/dL
NITRITE: NEGATIVE
Protein, ur: 100 mg/dL — AB
Specific Gravity, Urine: 1.011 (ref 1.005–1.030)
UROBILINOGEN UA: 1 mg/dL (ref 0.0–1.0)
pH: 5.5 (ref 5.0–8.0)

## 2015-02-06 LAB — PROTIME-INR
INR: 2.51 — ABNORMAL HIGH (ref 0.00–1.49)
PROTHROMBIN TIME: 27.3 s — AB (ref 11.6–15.2)

## 2015-02-06 LAB — CBC
HEMATOCRIT: 37.3 % — AB (ref 39.0–52.0)
Hemoglobin: 11.8 g/dL — ABNORMAL LOW (ref 13.0–17.0)
MCH: 34.5 pg — ABNORMAL HIGH (ref 26.0–34.0)
MCHC: 31.6 g/dL (ref 30.0–36.0)
MCV: 109.1 fL — ABNORMAL HIGH (ref 78.0–100.0)
Platelets: 145 10*3/uL — ABNORMAL LOW (ref 150–400)
RBC: 3.42 MIL/uL — AB (ref 4.22–5.81)
RDW: 15.4 % (ref 11.5–15.5)
WBC: 8.8 10*3/uL (ref 4.0–10.5)

## 2015-02-06 LAB — BRAIN NATRIURETIC PEPTIDE: B NATRIURETIC PEPTIDE 5: 1119.2 pg/mL — AB (ref 0.0–100.0)

## 2015-02-06 LAB — TROPONIN I
TROPONIN I: 0.83 ng/mL — AB (ref <0.031)
TROPONIN I: 2.48 ng/mL — AB (ref <0.031)
Troponin I: 2.36 ng/mL (ref <0.031)
Troponin I: 2.84 ng/mL (ref <0.031)

## 2015-02-06 LAB — MAGNESIUM: MAGNESIUM: 2 mg/dL (ref 1.5–2.5)

## 2015-02-06 LAB — TSH: TSH: 1.163 u[IU]/mL (ref 0.350–4.500)

## 2015-02-06 LAB — LACTIC ACID, PLASMA
Lactic Acid, Venous: 2.1 mmol/L (ref 0.5–2.0)
Lactic Acid, Venous: 1.5 mmol/L (ref 0.5–2.0)

## 2015-02-06 LAB — PHOSPHORUS: Phosphorus: 5.5 mg/dL — ABNORMAL HIGH (ref 2.3–4.6)

## 2015-02-06 LAB — I-STAT CG4 LACTIC ACID, ED
LACTIC ACID, VENOUS: 1.48 mmol/L (ref 0.5–2.0)
Lactic Acid, Venous: 1.85 mmol/L (ref 0.5–2.0)

## 2015-02-06 LAB — ETHANOL

## 2015-02-06 LAB — PROCALCITONIN: Procalcitonin: 1 ng/mL

## 2015-02-06 MED ORDER — WARFARIN - PHARMACIST DOSING INPATIENT: Freq: Every day | Status: DC

## 2015-02-06 MED ORDER — SODIUM CHLORIDE 0.9 % IJ SOLN
3.0000 mL | Freq: Two times a day (BID) | INTRAMUSCULAR | Status: DC
  Administered 2015-02-06 – 2015-02-13 (×14): 3 mL via INTRAVENOUS

## 2015-02-06 MED ORDER — TIMOLOL MALEATE 0.5 % OP SOLN
1.0000 [drp] | Freq: Two times a day (BID) | OPHTHALMIC | Status: DC
  Administered 2015-02-06 – 2015-02-13 (×14): 1 [drp] via OPHTHALMIC
  Filled 2015-02-06: qty 5

## 2015-02-06 MED ORDER — SODIUM CHLORIDE 0.9 % IV BOLUS (SEPSIS)
1000.0000 mL | Freq: Once | INTRAVENOUS | Status: AC
Start: 2015-02-06 — End: 2015-02-06
  Administered 2015-02-06: 1000 mL via INTRAVENOUS

## 2015-02-06 MED ORDER — PREDNISOLONE ACETATE 1 % OP SUSP
1.0000 [drp] | Freq: Two times a day (BID) | OPHTHALMIC | Status: DC
  Administered 2015-02-06 – 2015-02-13 (×14): 1 [drp] via OPHTHALMIC
  Filled 2015-02-06: qty 1

## 2015-02-06 MED ORDER — ONDANSETRON HCL 4 MG PO TABS: 4.0000 mg | ORAL_TABLET | Freq: Four times a day (QID) | ORAL | Status: DC | PRN

## 2015-02-06 MED ORDER — VANCOMYCIN HCL IN DEXTROSE 1-5 GM/200ML-% IV SOLN: 1000.0000 mg | Freq: Once | INTRAVENOUS | Status: DC

## 2015-02-06 MED ORDER — PIPERACILLIN-TAZOBACTAM 3.375 G IVPB 30 MIN
3.3750 g | Freq: Once | INTRAVENOUS | Status: AC
  Administered 2015-02-06: 3.375 g via INTRAVENOUS
  Filled 2015-02-06: qty 50

## 2015-02-06 MED ORDER — ONDANSETRON HCL 4 MG/2ML IJ SOLN: 4.0000 mg | Freq: Four times a day (QID) | INTRAMUSCULAR | Status: DC | PRN

## 2015-02-06 MED ORDER — MORPHINE SULFATE 2 MG/ML IJ SOLN
1.0000 mg | INTRAMUSCULAR | Status: DC | PRN
  Administered 2015-02-06 – 2015-02-12 (×7): 1 mg via INTRAVENOUS
  Filled 2015-02-06 (×8): qty 1

## 2015-02-06 MED ORDER — WARFARIN SODIUM 5 MG PO TABS
5.0000 mg | ORAL_TABLET | ORAL | Status: AC
  Administered 2015-02-06: 5 mg via ORAL
  Filled 2015-02-06: qty 1

## 2015-02-06 MED ORDER — PIPERACILLIN-TAZOBACTAM 3.375 G IVPB
3.3750 g | Freq: Three times a day (TID) | INTRAVENOUS | Status: DC
  Administered 2015-02-06 – 2015-02-10 (×11): 3.375 g via INTRAVENOUS
  Filled 2015-02-06 (×10): qty 50

## 2015-02-06 MED ORDER — FERROUS SULFATE 325 (65 FE) MG PO TABS
325.0000 mg | ORAL_TABLET | Freq: Every day | ORAL | Status: DC
  Administered 2015-02-06 – 2015-02-13 (×8): 325 mg via ORAL
  Filled 2015-02-06 (×9): qty 1

## 2015-02-06 MED ORDER — SODIUM CHLORIDE 0.9 % IJ SOLN: 3.0000 mL | INTRAMUSCULAR | Status: DC | PRN

## 2015-02-06 MED ORDER — VANCOMYCIN HCL 10 G IV SOLR
1750.0000 mg | INTRAVENOUS | Status: DC
  Administered 2015-02-07 – 2015-02-09 (×3): 1750 mg via INTRAVENOUS
  Filled 2015-02-06 (×5): qty 1750

## 2015-02-06 MED ORDER — SODIUM CHLORIDE 0.9 % IV SOLN
2500.0000 mg | Freq: Once | INTRAVENOUS | Status: AC
  Administered 2015-02-06: 2500 mg via INTRAVENOUS
  Filled 2015-02-06: qty 2500

## 2015-02-06 MED ORDER — SODIUM CHLORIDE 0.9 % IV SOLN: 250.0000 mL | INTRAVENOUS | Status: DC | PRN

## 2015-02-06 NOTE — Clinical Social Work Note (Signed)
Clinical Social Work Assessment  Patient Details  Name: [name] MRN: [id number] Date of Birth: May 24, 1946  Date of referral:  02/06/15               Reason for consult:  Abuse/Neglect, Facility Placement, Discharge Planning                Permission sought to share information with:    Permission granted to share information::  No  Name::        Agency::     Relationship::     Contact Information:     Housing/Transportation Living arrangements for the past 2 months:  Single Family Home Source of Information:  Patient Patient Interpreter Needed:  None Criminal Activity/Legal Involvement Pertinent to Current Situation/Hospitalization:  No - Comment as needed Significant Relationships:  Parents Lives with:  Parents Do you feel safe going back to the place where you live?  Yes Need for family participation in patient care:  Yes (Comment)  Care giving concerns:  Pt lives at home with pt father, who is 16 years old and primary care giver. Concerns regarding pt found on floor by ems, could not get up alone, was covered in urine, trash on floor, and patient unable to provide any care to self. Pt father is primary care giver and unable to provide much physical care for patient at all if any.   Social Worker assessment / plan:  CSW met with pt to complete assessment. Pt shared that he didn't want to come to the hospital, however glad that he did. Pt states that he has been to a skilled nursing facility before and does not ever wish to return. CSW and pt discussed pt limited mobility. Pt is hopeful that by the end of hospitalization pt will have enough strength to return home and be able to do some of this base line adls. Patient states that he does not feel that he has to go to a facility because of limited mobility and care.  Pt is alert to self, place, situation, and time. Pt states he sees no problem with his current living environment.   Please consult CSW if higher level of care is  recommended.   Employment status:  Unemployed Nurse, adult PT Recommendations:  Not assessed at this time Information / Referral to community resources:     Patient/Family's Response to care: Patient thankful for assistance but not interested in skilled nursing.   Patient/Family's Understanding of and Emotional Response to Diagnosis, Current Treatment, and Prognosis:   Patient is not accepting of needing further help at this time. Pt states he is determined no matter what to return home.   Emotional Assessment Appearance:  Malodorous, Disheveled Attitude/Demeanor/Rapport:  Other, Guarded Affect (typically observed):  Calm Orientation:  Oriented to Self, Oriented to Place, Oriented to  Time, Oriented to Situation Alcohol / Substance use:  Not Applicable Psych involvement (Current and /or in the community):  No (Comment)  Discharge Needs  Concerns to be addressed:    Readmission within the last 30 days:  No Current discharge risk:  Other (morbidly obese, limited mobility) Barriers to Discharge:  Continued Medical Work up   [employer], [place], LCSW 02/06/2015, 1:19 PM

## 2015-02-06 NOTE — Consult Note (Signed)
CARDIOLOGY CONSULT NOTE   Patient ID: [name] MRN: [id number] DOB/AGE: [birth year] 69 y.o.  Admit date: 02/06/2015  Primary Physician   [name], MD Primary Cardiologist  Dr. Antoine Poche Reason for Consultation  Elevated trop and cardiomegaly  HPI: [name] is a 69 y.o. 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 Fib on coumadin therapy, multiple fall obesity, hypertension, hx of V. Tach, anemia, hypoxia, GERD, who presented to [facility] after a Fall.  Pt of Dr. Antoine Poche, last seen 09/2013 - was euvolemic; rate and rhythm controlled. 2D echo 04/2013  -LV EF 50-55%. No WM abnormality, moderate LVH, mild mitral reg, L atrium mildly dilated, R atrium moderately dilated, PA peak pressure: 53mm Hg.  He presents to the hospital after having a fall out of his bed, he states that he was sitting on the edge of the bed and the rubber mat underneath his feet slipped out from underneath him causing him to fall to his palm on the floor. He denies loss of consciousness, headache, nausea, vomiting,chest pain, SOB or palpitation. He required assistance from the paramedics to get off the floor, was covered in urine and trash on floor.  Per EMS, patient baseline is able to walk with a walker however due to  fall yesterday,  patient unable to ambulate independently with walker. He does have chronic swelling of his lower extremities for which he is supposed to be using compression stockings. Pt dose have a hx of hematoma and cellulitis of right leg. Pt lives at home with pt father, who is 69 years old and primary care giver.  In ED, Alcohol level is undetectable, chest x-ray shows cardiomegaly, Trop 0.83, BNP 1100, INR 2.51, Hgb 11.8, K 5.6, Cr 1.6, BUN 32, AST 470, ALT 212, ALK 149, proteinuria, hyalin cast, and lactic acid normal. EKG - rate 79 with chronic A.fib with poor R wave progression.  Past Medical History  Diagnosis Date  . Cardiomyopathy    nonischemic  . CHF (congestive heart failure)     ejection fractio of 45% per echo in 2011 (previos EF 35-40% in 2003), echo 04/2013  -LVEF 50-55%.  . Alcoholism   . Atrial fibrillation or flutter   . Hypertension   . Obesity   . Ventricular tachycardia     nonsustained, asymptomatic  . Anemia   . Hypoxemia   . GERD (gastroesophageal reflux disease)     takes tums prn  . Complication of anesthesia     April 11, 2013     Past Surgical History  Procedure Laterality Date  . Tonsillectomy    . Knee bursectomy Right 04/20/2013    Procedure: Right knee prepatella bursa decompression;  Surgeon: Cammy Copa, MD;  Location: [facility];  Service: Orthopedics;  Laterality: Right;  . I&d extremity Right 06/28/2013    Procedure: IRRIGATION AND DEBRIDEMENT OF RIGHT LEG WITH SURGICAL PREP/PLACEMENT OF ACELL ;  Surgeon: Wayland Denis, DO;  Location: [facility] OR;  Service: Plastics;  Laterality: Right;    No Known Allergies  I have reviewed the patient's current medications   Prior to Admission medications   Medication Sig Start Date End Date Taking? Authorizing Provider  carvedilol (COREG) 6.25 MG tablet Take 6.25 mg by mouth 2 (two) times daily with a meal.   Yes Historical Provider, MD  colchicine 0.6 MG tablet Take 0.6 mg by mouth daily. colcrys   Yes Historical Provider, MD  ferrous sulfate 325 (65 FE) MG tablet  Take 1 tablet by mouth daily.   Yes Historical Provider, MD  furosemide (LASIX) 40 MG tablet Take 1 tablet (40 mg total) by mouth daily. 04/25/13  Yes Robbie Lis, MD  neomycin-bacitracin-polymyxin (NEOSPORIN) ointment Apply 1 application topically daily.    Yes Historical Provider, MD  olmesartan (BENICAR) 40 MG tablet Take 40 mg by mouth daily.   Yes Historical Provider, MD  potassium chloride SA (K-DUR,KLOR-CON) 20 MEQ tablet Take 1 tablet by mouth 2 (two) times daily. 01/08/15  Yes Historical Provider, MD  pravastatin (PRAVACHOL) 10 MG tablet Take 10 mg by mouth daily.   Yes Historical  Provider, MD  prednisoLONE acetate (PRED FORTE) 1 % ophthalmic suspension Place 1 drop into the right eye 2 (two) times daily. 01/28/15 01/28/16 Yes Historical Provider, MD  timolol (TIMOPTIC) 0.5 % ophthalmic solution Place 1 drop into the right eye 2 (two) times daily. 10/17/14 10/17/15 Yes Historical Provider, MD  vitamin C (ASCORBIC ACID) 250 MG tablet Take 250 mg by mouth 2 (two) times daily.   Yes Historical Provider, MD  warfarin (COUMADIN) 5 MG tablet Take 2.5-5 mg by mouth daily with breakfast. Takes a full tablet on MWF and half of a tablet (2.$RemoveBef'5mg'[id number]$ ) on all other days. 11/15/14  Yes Historical Provider, MD  HYDROcodone-acetaminophen (NORCO) 5-325 MG per tablet Take 1 tablet by mouth every 4 (four) hours as needed for moderate pain. Patient not taking: Reported on 02/06/2015 02/05/15   Daleen Bo, MD  potassium chloride (K-DUR) 10 MEQ tablet Take 2 tablets (20 mEq total) by mouth 2 (two) times daily. Patient not taking: Reported on 02/05/2015 04/25/13   Robbie Lis, MD     History   Social History  . Marital Status: Single    Spouse Name: N/A  . Number of Children: N/A  . Years of Education: N/A   Occupational History  . Not on file.   Social History Main Topics  . Smoking status: Former Smoker    Start date: 06/29/1987  . Smokeless tobacco: Never Used     Comment: stopped in 1985  . Alcohol Use: 6.0 oz/week    10 Cans of beer per week  . Drug Use: No  . Sexual Activity: No   Other Topics Concern  . Not on file   Social History Narrative   Has never been married, lives with his father. Is currently disabled. Quit smoking in 1985 after two packs per day for 10 years. Still drinks beer.     Family Status  Relation Status Death Age  . Mother Deceased   . Father Alive    Family History  Problem Relation Age of Onset  . COPD Mother   . Hypertension Father      ROS:  Full 14 point review of systems complete and found to be negative unless listed above.  Physical  Exam: Blood pressure 90/50, pulse 57, temperature 97.8 F (36.6 C), temperature source Oral, resp. rate 16, SpO2 100 %.  General: Well developed, well nourished, obese  male in no acute distress Head: Eyes PERRLA, No xanthomas. Normocephalic and atraumatic, oropharynx without edema or exudate.  Lungs: Resp regular and unlabored, CTA. Heart: irregular, normal rate, no murmurs..   Neck: No carotid bruits. No lymphadenopathy.  No JVD. Abdomen: Bowel sounds present, abdomen soft and non-tender without masses or hernias noted. Msk:  No spine or cva tenderness. Generalized weakness.  Extremities: Bilateral lower extremities with hyperpigmentation, edema, warm to touch and 2+ edema. Distal pulse intact. Neuro:  Alert and oriented X 3. No focal deficits noted. Psych:  Good affect, responds appropriately Skin: multiple small bruise.   Labs:   Lab Results  Component Value Date   WBC 8.8 02/06/2015   HGB 11.8* 02/06/2015   HCT 37.3* 02/06/2015   MCV 109.1* 02/06/2015   PLT 145* 02/06/2015    Recent Labs  02/06/15 0838  INR 2.51*     Recent Labs Lab 02/06/15 1400  NA 143  K 5.4*  CL 109  CO2 24  BUN 39*  CREATININE 1.78*  CALCIUM 8.5  PROT 7.2  BILITOT 4.4*  ALKPHOS 147*  ALT 209*  AST 490*  GLUCOSE 97  ALBUMIN 3.6    Recent Labs  02/06/15 0838 02/06/15 1400  TROPONINI 0.83* 2.48*   BNP    Component Value Date/Time   BNP 1119.2* 02/06/2015 0838    Urinalysis    Component Value Date/Time   COLORURINE AMBER* 02/06/2015 1212   APPEARANCEUR CLOUDY* 02/06/2015 1212   LABSPEC 1.011 02/06/2015 1212   PHURINE 5.5 02/06/2015 1212   GLUCOSEU NEGATIVE 02/06/2015 1212   HGBUR SMALL* 02/06/2015 1212   BILIRUBINUR NEGATIVE 02/06/2015 1212   KETONESUR NEGATIVE 02/06/2015 1212   PROTEINUR 100* 02/06/2015 1212   UROBILINOGEN 1.0 02/06/2015 1212   NITRITE NEGATIVE 02/06/2015 1212   LEUKOCYTESUR TRACE* 02/06/2015 1212    Echo: 04/2013 Study Conclusions  - Left  ventricle: The cavity size was normal. Wall thickness was increased in a pattern of moderate LVH. Systolic function was normal. The estimated ejection fraction was in the range of 50% to 55%. Wall motion was normal; there were no regional wall motion abnormalities. - Mitral valve: Mild regurgitation. - Left atrium: The atrium was mildly dilated. - Right atrium: The atrium was moderately dilated. - Pulmonary arteries: Systolic pressure was moderately increased. PA peak pressure: 63mm Hg (S).  ECG:  Rate controlled a.fib with with poor R wave progression. No significant change from 02/05/15.  Radiology:  Dg Tibia/fibula Left  02/05/2015   CLINICAL DATA:  Left leg pain and swelling  EXAM: LEFT TIBIA AND FIBULA - 2 VIEW  COMPARISON:  None.  FINDINGS: There is no evidence of fracture or other focal bone lesions. Soft tissues are unremarkable.  IMPRESSION: No acute osseous injury of the left tibia or fibula.   Electronically Signed   By: Elige Ko   On: 02/05/2015 18:54   US Abdomen Complete  02/06/2015   CLINICAL DATA:  Elevated liver function tests.  EXAM: ULTRASOUND ABDOMEN COMPLETE  COMPARISON:  None.  FINDINGS: Gallbladder: A few small, mobile gallstones are identified. There is no gallbladder wall thickening or pericholecystic fluid. Sonographer reports negative Murphy's sign.  Common bile duct: Diameter: 0.4 cm  Liver: No focal lesion identified. Within normal limits in parenchymal echogenicity.  IVC: No abnormality visualized.  Pancreas: Visualized portion unremarkable.  Spleen: Size and appearance within normal limits.  Right Kidney: Length: 10.9 cm. Echogenicity within normal limits. No mass or hydronephrosis visualized.  Left Kidney: Length: 12.3 cm. Echogenicity within normal limits. No mass or hydronephrosis visualized.  Abdominal aorta: No aneurysm visualized.  Other findings: None.  IMPRESSION: A few small gallstones are identified but there is no evidence of acute  cholecystitis. The examination is otherwise negative.   Electronically Signed   By: Drusilla Kanner M.D.   On: 02/06/2015 11:45   Dg Chest Port 1 View  02/06/2015   CLINICAL DATA:  Pain following fall  EXAM: PORTABLE CHEST - 1 VIEW  COMPARISON:  April 26, 2013  FINDINGS: There is no edema or consolidation. Heart is enlarged with pulmonary vascularity within normal limits. No adenopathy. No pneumothorax. No bone lesions.  IMPRESSION: Cardiomegaly.  No edema or consolidation.   Electronically Signed   By: Lowella Grip III M.D.   On: 02/06/2015 09:17    ASSESSMENT AND PLAN:    Principal Problem:   NSTEMI (non-ST elevated myocardial infarction) Active Problems:   Essential hypertension   Anemia   Hematoma   Atrial fibrillation   Elevated liver enzymes   Respiratory distress   Cardiomegaly   Elevated troponin  A 69 y.o. 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 Fib on coumadin therapy, multiple fall, obesity, hypertension, hx of V. Tach, anemia, hypoxia, GERD, who was consulted for possible cardiac etiology of multiple fall and multiple lab abnormalities.  Pt was in rate and rhythm controlled when last seen by Dr. Percival Spanish 09/2013. Pt had multiple fall. He presented today with BMP 1100, Trop of 0.83, K 5.6, Cr 1.6, elevated liver enzyme. Abd US showed small gallstones but no evidence of acute cholecystitis. CXR showed cardiomegaly. Patient denied SOB, CP or palpation. EKG showed rate controlled a.fib with poor R wave progression.  NO JVD. 2+ LE edema. Likely demand t ischemia due to AKI. Will do 2D echo. Cycle trop. He is non symptomatic. His BP soft low with pulse in 60s. Continue BB, stain and PO lasix. Coumadin per pharmacy. Hold Banicar in a setting of AKI. Takes K-Dur 30meq BID at home. Hold PM dose of K in setting of borderline high K. Resume in AM tomorrow if normal. His Troponin increased 0.83 to 2.48. MD to decided on workup. Avoid  nephrotoxic agents. Recommended admission, stool occult, cellulitis tx.   SignedLeanor Kail, [place] 02/06/2015, 3:20 PM Pager [phone]  Co-Sign MD  The patient was seen in the ER. Agree with assessment and plan as noted above.  The patient steadfastly denies any history of chest pain.  He denies shortness of breath.  He was brought to the hospital today after having another fall.  He was also seen in the hospital emergency room yesterday.  He has a history of chronic atrial fibrillation and is on warfarin for anticoagulation and on beta blocker for rate control.  He has a history of a nonischemic cardiomyopathy by history.  His last echocardiogram on 04/23/13 showed an ejection fraction of 50-55% and there were no regional wall motion abnormalities.  He had mild mitral regurgitation and he had biatrial enlargement and there was pulmonary artery pressure elevation of 58 mmHg.  I do not find any prior Myoview or ischemic workup in his old chart. On examination he is lying flat in no acute distress .  He is volume overloaded.  He has severe edema of the lower extremities with cellulitis and with a large hematoma on the lateral aspect of the left lower leg from a recent fall.  He also has edema of the presacral and lower back areas.  His rhythm is atrial fibrillation with a controlled ventricular response.  No significant murmurs are noted. The patient has renal insufficiency with creatinine of 1.78.  His EKGs were reviewed.  There is no significant change in his EKG since yesterday, personally reviewed.  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  His second troponin is elevated at 2.48.  This may be  secondary to demand ischemia.  He is not a candidate for left heart cardiac catheterization at this point.  Agree with cycling enzymes and following EKGs.  Consider Lexiscan  Myoview at some point. Will follow with you

## 2015-02-06 NOTE — Progress Notes (Signed)
CSW received consult for possible self neglect. Per EMS, patient was found on the floor, unable to get back up in the bed. Patient 69 year old father is primary care giver for patient. Patient told ems that he was in a snf 2 years ago and may need to go back. Per EMS patient room was filthy, pt covered in urine, floor covered. Patient had to be taken out by a tarp. Per EMS, patient baseline is able to walk with a walker however due to hematoma related to fall yesterday patient unable to ambulate independently with walker. Per discussion with MD, patient states he didn't want to come to the hosptial only call 911 to get back in the bed. Per MD, patient refuses snf palcement at this time. MD plans to walk patient. CSW will complete assessment to determine pt needs and determine if APS report is warranted at this time. At this time patient receiving assistance from tech.   Olga Coaster, LCSW  Clinical Social Work  [employer] [phone]

## 2015-02-06 NOTE — ED Notes (Signed)
Patient states that he is still unable to void at this time.

## 2015-02-06 NOTE — H&P (Addendum)
[facility] History and Physical  [name] MRN:[id number] DOB: 09/30/1946 DOA: 02/06/2015  Referring physician: Dr. Brian [name]  PCP: [name], TAMMY, MD  Cardiologist: Dr. Hochrein   Chief Complaint:   HPI:  68 y.o. 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 Fib on coumadin therapy, multiple falls, obesity, hypertension, hx of V. Tach, anemia, GERD, who presented to [facility] after an episode of fall from hib bed that occurred several hours prior to this admission. Pt explained he was sitting at the edge of the bed, rubber mat under his feet slipped and he fell as a result. He has required assistance of EMS to get up. EMS team reported pt found on floor covered in urine and trash. Pt says he lives with father who is 93 yo and is his primary caretaker. Pt denies any specific symptoms prior to this even, no chest pain or shortness of breath, no dizziness or lightheadedness, no fevers, chills, no specific abd or urinary concerns.   In ED, pt noted to be confused but able to answer most of the questions and follow commands on physical exam. VS notable for T 99.3F, HR 50-128 bpm, RR 14-22 bpm, BP 85/54 mmHg, oxygen saturation 88% on RA. Blood work notable for Trop 0.83, BNP 1100, INR 2.51, Hgb 11.8, K 5.6, Cr 1.6, BUN 32, AST 470, ALT 212, ALK 149, and lactic acid initially NWL but repeat lactic acid 2.1. EKG - rate 79 with chronic A.fib with poor R wave progression. Cardiology consulted and TR[facility] asked to admit to SDU.   Assessment and Plan: Principal Problem:   Sepsis with multiple organ dysfunction syndrome  - criteria for sepsis met on admission: HR in 130's, RR 22 bpm, lactic acid 2.1, BP 88/54, O2 sat 88% on RA, PCT 1 - I suspect source to be lower extremity cellulitis, CXR with no signs of PNA - UA with trace leukocytes so it is possible contributing source as wel - will start broad spectrum ABX vancomycin and  zosyn for now until more data available - follow up on urine and blood cultures  - outline the area of cellulitis in LE's with marker so we can monitor clinical response to ABX - hold home blood pressure medication coreg, lasix, benicar until BP stabilizes    NSTEMI (non-ST elevated myocardial infarction) - in pt with known a-fib rate controlled and history of ischemic cardiomyopathy  - EKG with bifascicular block, right bundle branch block, left anterior hemiblock, poor R-wave progression across the lateral precordial leads - second troponin elevated at 2.48 likely econdary to demand ischemia - per cardiology, pt is not a candidate for left heart cardiac catheterization at this point - continue cycling cardiac enzymes and following EKGs - cardiology team to consider Lexiscan Myoview    Acute hypoxic respiratory failure, oxygen sat 88% on RA on admission  - secondary to demand ischemia  - management as noted above, supportive care with oxygen , ABX for cellulitis  - continue cycling cardiac enzymes and monitor on telemetry  Active Problems:   Acute on chronic diastolic CHF - no pulmonary vascular congestion noted on exam but pt with severe LE pitting edema - 2 D ECHO requested - weight this AM 145 kg - hypotension precludes use of Lasix at this point - will monitor clinical status in SDU - monitor daily weights, strict I/O   Hypotension - holding home BP medications as noted above      Hyperkalemia - pt has been on oral supplementation at home as well on ARB Benicar - hold both medications  - repeat BMP requested    Acute renal failure - appears to be related to sepsis etiology - pt has received IVF in ED - will hold off on additional IVF and will encourage PO intake as pt able to tolerate - repeat BMP in AM - avoid nephrotoxic medications colchicine and Benicar that pt takes at home   Atrial fibrillation with RVR, CHADS 2 score 4 - rate better controlled - hypotension precludes  use of beta blocker at this time - monitor VS in SDU - continue coumadin per pharmacy    Elevated liver enzymes - secondary to alcohol induced hepatitis - repeat LFT's in AM   Alcohol abuse - keep on CIWA protocol - discuss alcohol cessation once pt more medically stable    Anemia of chronic disease, macrocytic - likely alcohol induced, possible vit B12 - Hg currently stable with no indication for transfusion - anemia panel requested    Thrombocytopenia - also alcohol induced bone marrow damage - no signs of bleeding - OK to use Coumadin - repeat CBC in AM   HLD - check lipid panel in AM  - hold statin that pt takes at home until renal function stabilizes    Morbid obesity - BMI > 40   DVT prophylaxis  - pt is on Coumadin    Radiological Exams on Admission:  Dg Tibia/fibula Left  02/05/2015   No acute osseous injury of the left tibia or fibula.     [place] Abd  02/06/2015  A few small gallstones are identified but there is no evidence of acute cholecystitis.   Dg Chest Port 1 View  02/06/2015  Cardiomegaly.  No edema or consolidation.    CODE STATUS: FULL Discussed lab tests, diagnosis, further plan of care with pt in detail and answered pt's questions to his satisfaction.  Disposition: Needs admission to SDU, when medically stable, wants to go home   [name]   Review of Systems:  Constitutional: Negative for fever, chills and malaise/fatigue. Negative for diaphoresis.  HENT: Negative for hearing loss, ear pain, nosebleeds, sore throat, neck pain, tinnitus and ear discharge.   Eyes: Negative for blurred vision, double vision, photophobia, pain, discharge and redness.  Respiratory: Negative for cough, hemoptysis, wheezing and stridor.   Cardiovascular: Negative for chest pain, palpitations, orthopnea, claudication.  Gastrointestinal: Negative for nausea, vomiting and abdominal pain.  Genitourinary: Negative for dysuria, urgency, frequency, hematuria and flank  pain.  Musculoskeletal: Negative for myalgia.  Skin: Negative for itching and rash.  Neurological: Negative for dizziness and weakness.   Endo/Heme/Allergies: Negative for environmental allergies and polydipsia. Does not bruise/bleed easily.  Psychiatric/Behavioral: Negative for suicidal ideas. The patient is not nervous/anxious.      Past Medical History  Diagnosis Date  . Cardiomyopathy     nonischemic  . CHF (congestive heart failure)     ejection fractio of 45% per echo in 2011 (previos EF 35-40% in 2003), echo 04/2013  -LVEF 50-55%.  . Alcoholism   . Atrial fibrillation or flutter   . Hypertension   . Obesity   . Ventricular tachycardia     nonsustained, asymptomatic  . Anemia   . Hypoxemia   . GERD (gastroesophageal reflux disease)     takes tums prn  . Complication of anesthesia     April 11, 2013    Past Surgical History  Procedure Laterality  Date  . Tonsillectomy    . Knee bursectomy Right 04/20/2013    Procedure: Right knee prepatella bursa decompression;  Surgeon: Gregory Scott Dean, MD;  Location: [facility] OR;  Service: Orthopedics;  Laterality: Right;  . I&d extremity Right 06/28/2013    Procedure: IRRIGATION AND DEBRIDEMENT OF RIGHT LEG WITH SURGICAL PREP/PLACEMENT OF ACELL ;  Surgeon: Claire Sanger, DO;  Location: [facility] OR;  Service: Plastics;  Laterality: Right;    Social History:  reports that he has quit smoking. He started smoking about 27 years ago. He has never used smokeless tobacco. He reports that he drinks about 6.0 oz of alcohol per week. He reports that he does not use illicit drugs.  No Known Allergies  Family History  Problem Relation Age of Onset  . COPD Mother   . Hypertension Father     Prior to Admission medications   Medication Sig Start Date End Date Taking? Authorizing Provider  carvedilol (COREG) 6.25 MG tablet Take 6.25 mg by mouth 2 (two) times daily with a meal.   Yes Historical Provider, MD  colchicine 0.6 MG tablet Take 0.6 mg by mouth  daily. colcrys   Yes Historical Provider, MD  ferrous sulfate 325 (65 FE) MG tablet Take 1 tablet by mouth daily.   Yes Historical Provider, MD  furosemide (LASIX) 40 MG tablet Take 1 tablet (40 mg total) by mouth daily. 04/25/13  Yes Alma M Devine, MD  neomycin-bacitracin-polymyxin (NEOSPORIN) ointment Apply 1 application topically daily.    Yes Historical Provider, MD  olmesartan (BENICAR) 40 MG tablet Take 40 mg by mouth daily.   Yes Historical Provider, MD  potassium chloride SA (K-DUR,KLOR-CON) 20 MEQ tablet Take 1 tablet by mouth 2 (two) times daily. 01/08/15  Yes Historical Provider, MD  pravastatin (PRAVACHOL) 10 MG tablet Take 10 mg by mouth daily.   Yes Historical Provider, MD  prednisoLONE acetate (PRED FORTE) 1 % ophthalmic suspension Place 1 drop into the right eye 2 (two) times daily. 01/28/15 01/28/16 Yes Historical Provider, MD  timolol (TIMOPTIC) 0.5 % ophthalmic solution Place 1 drop into the right eye 2 (two) times daily. 10/17/14 10/17/15 Yes Historical Provider, MD  vitamin C (ASCORBIC ACID) 250 MG tablet Take 250 mg by mouth 2 (two) times daily.   Yes Historical Provider, MD  warfarin (COUMADIN) 5 MG tablet Take 2.5-5 mg by mouth daily with breakfast. Takes a full tablet on MWF and half of a tablet (2.5mg) on all other days. 11/15/14  Yes Historical Provider, MD  HYDROcodone-acetaminophen (NORCO) 5-325 MG per tablet Take 1 tablet by mouth every 4 (four) hours as needed for moderate pain. Patient not taking: Reported on 02/06/2015 02/05/15   Elliott Wentz, MD  potassium chloride (K-DUR) 10 MEQ tablet Take 2 tablets (20 mEq total) by mouth 2 (two) times daily. Patient not taking: Reported on 02/05/2015 04/25/13   Alma M Devine, MD    Physical Exam: Filed Vitals:   02/06/15 1615 02/06/15 1700 02/06/15 1900 02/06/15 1940  BP: 86/69 92/57 86/56 126/68  Pulse: 53 51  56  Temp:      TempSrc:      Resp: 21 16 16 16  SpO2: 98% 97%  99%    Physical Exam  Constitutional: Appears morbidly  obese in no acute distress HENT: Normocephalic. External right and left ear normal. Oropharynx is clear and moist.  Eyes: Conjunctivae and EOM are normal. PERRLA, no scleral icterus.  Neck: Normal ROM. Neck supple. No JVD. No tracheal deviation. No thyromegaly.    CVS: IRRR, no gallops, no carotid bruit.  Pulmonary: Effort and breath sounds normal, no stridor, diminished breath sounds at bases  Abdominal: Soft. BS +,  no distension, tenderness, rebound or guarding.  Musculoskeletal: Normal range of motion. Bilateral LE pitting edema +2 extending to bilateral mid thigh area, TTP and warmth to touch in bother LE with erythema and underlying chronic venous stasis changes  Lymphadenopathy: No lymphadenopathy noted, cervical, inguinal. Neuro: Alert. Normal reflexes, muscle tone coordination. No cranial nerve deficit. Skin: Bilateral LE erythema with macular rash in mid inner thigh areas bilaterally  Psychiatric: Normal mood and affect. \  Labs on Admission:  Basic Metabolic Panel:  Recent Labs Lab 02/05/15 1822 02/06/15 0838 02/06/15 1400  NA 142 140 143  K 4.7 5.6* 5.4*  CL 109 109 109  CO2 25 21 24  GLUCOSE 89 94 97  BUN 22 32* 39*  CREATININE 0.90 1.65* 1.78*  CALCIUM 8.9 8.4 8.5  MG  --   --  2.0  PHOS  --   --  5.5*   Liver Function Tests:  Recent Labs Lab 02/05/15 1822 02/06/15 0838 02/06/15 1400  AST 27 470* 490*  ALT 16 212* 209*  ALKPHOS 150* 149* 147*  BILITOT 4.1* 5.2* 4.4*  PROT 7.6 7.2 7.2  ALBUMIN 3.7 3.6 3.6   \CBC:  Recent Labs Lab 02/05/15 1822 02/06/15 0838  WBC 7.4 8.8  NEUTROABS 5.9  --   HGB 12.6* 11.8*  HCT 39.4 37.3*  MCV 107.4* 109.1*  PLT 140* 145*   Cardiac Enzymes:  Recent Labs Lab 02/06/15 0838 02/06/15 1400 02/06/15 1502  TROPONINI 0.83* 2.48* 2.36*    If 7PM-7AM, please contact night-coverage [email or web address] Password [email or web address] 02/06/2015, 7:59 PM       

## 2015-02-06 NOTE — ED Provider Notes (Signed)
CSN: [id number]     Arrival date & time 02/06/15  0804 History   First MD Initiated Contact with Patient 02/06/15 [phone]     Chief Complaint  Patient presents with  . Fall     (Consider location/radiation/quality/duration/timing/severity/associated sxs/prior Treatment) HPI Comments: The patient is a 69 year old male with a history of congestive heart failure secondary to nonischemic cardiomyopathy, alcoholism and chronic atrial fibrillation on Coumadin therapy. He presents to the hospital after having a fall out of his bed, he states that he was sitting on the edge of the bed and the rubber mat underneath his feet slipped out from underneath him causing him to fall to his palm on the floor. He denies loss of consciousness, denies back pain or neck pain, has no headache nausea vomiting chest pain shortness of breath back pain and denies feeling short of breath. He does have chronic swelling of his lower extremities for which he is supposed to be using compression stockings, he also had a recent visit to the emergency department for a contusion of his left proximal lower extremity just distal to the knee which caused a hematoma. He states that he required assistance from the paramedics to get off the floor, he had nothing to hold onto per his report. He states that his toilet in the bathroom does have assistance with large handhold areas if he should follow-up, he does not have this in his bedroom. This occurred just prior to arrival, paramedics at that house was in disarray, dirty and they have some difficulty getting him off the ground.  Patient is a 69 y.o. male presenting with fall. The history is provided by the patient and medical records.  Fall    Past Medical History  Diagnosis Date  . Cardiomyopathy     nonischemic  . CHF (congestive heart failure)     ejection fractio of 45% per echo in 2011 (previos EF 35-40% in 2003)  . Alcoholism   . Atrial fibrillation or flutter   . Hypertension    . Obesity   . Ventricular tachycardia     nonsustained, asymptomatic  . Anemia   . Hypoxemia   . GERD (gastroesophageal reflux disease)     takes tums prn  . Complication of anesthesia     April 11, 2013   Past Surgical History  Procedure Laterality Date  . Tonsillectomy    . Knee bursectomy Right 04/20/2013    Procedure: Right knee prepatella bursa decompression;  Surgeon: Cammy Copa, MD;  Location: [facility] OR;  Service: Orthopedics;  Laterality: Right;  . I&d extremity Right 06/28/2013    Procedure: IRRIGATION AND DEBRIDEMENT OF RIGHT LEG WITH SURGICAL PREP/PLACEMENT OF ACELL ;  Surgeon: Wayland Denis, DO;  Location: [facility] OR;  Service: Plastics;  Laterality: Right;   Family History  Problem Relation Age of Onset  . COPD Mother   . Hypertension Father    History  Substance Use Topics  . Smoking status: Former Smoker    Start date: 06/29/1987  . Smokeless tobacco: Never Used     Comment: stopped in 1985  . Alcohol Use: 6.0 oz/week    10 Cans of beer per week    Review of Systems  All other systems reviewed and are negative.     Allergies  Review of patient's allergies indicates no known allergies.  Home Medications   Prior to Admission medications   Medication Sig Start Date End Date Taking? Authorizing Provider  carvedilol (COREG) 6.25 MG tablet Take 6.25  mg by mouth 2 (two) times daily with a meal.   Yes Historical Provider, MD  colchicine 0.6 MG tablet Take 0.6 mg by mouth daily. colcrys   Yes Historical Provider, MD  ferrous sulfate 325 (65 FE) MG tablet Take 1 tablet by mouth daily.   Yes Historical Provider, MD  furosemide (LASIX) 40 MG tablet Take 1 tablet (40 mg total) by mouth daily. 04/25/13  Yes Alison Murray, MD  neomycin-bacitracin-polymyxin (NEOSPORIN) ointment Apply 1 application topically daily.    Yes Historical Provider, MD  olmesartan (BENICAR) 40 MG tablet Take 40 mg by mouth daily.   Yes Historical Provider, MD  potassium chloride SA  (K-DUR,KLOR-CON) 20 MEQ tablet Take 1 tablet by mouth 2 (two) times daily. 01/08/15  Yes Historical Provider, MD  pravastatin (PRAVACHOL) 10 MG tablet Take 10 mg by mouth daily.   Yes Historical Provider, MD  prednisoLONE acetate (PRED FORTE) 1 % ophthalmic suspension Place 1 drop into the right eye 2 (two) times daily. 01/28/15 01/28/16 Yes Historical Provider, MD  timolol (TIMOPTIC) 0.5 % ophthalmic solution Place 1 drop into the right eye 2 (two) times daily. 10/17/14 10/17/15 Yes Historical Provider, MD  vitamin C (ASCORBIC ACID) 250 MG tablet Take 250 mg by mouth 2 (two) times daily.   Yes Historical Provider, MD  warfarin (COUMADIN) 5 MG tablet Take 2.5-5 mg by mouth daily with breakfast. Takes a full tablet on MWF and half of a tablet (2.5mg ) on all other days. 11/15/14  Yes Historical Provider, MD  HYDROcodone-acetaminophen (NORCO) 5-325 MG per tablet Take 1 tablet by mouth every 4 (four) hours as needed for moderate pain. Patient not taking: Reported on 02/06/2015 02/05/15   Mancel Bale, MD  potassium chloride (K-DUR) 10 MEQ tablet Take 2 tablets (20 mEq total) by mouth 2 (two) times daily. Patient not taking: Reported on 02/05/2015 04/25/13   Alison Murray, MD   BP 93/59 mmHg  Pulse 86  Temp(Src) 99.3 F (37.4 C) (Oral)  Resp 20  SpO2 88% Physical Exam  Constitutional: He appears well-developed and well-nourished. No distress.  HENT:  Head: Normocephalic and atraumatic.  Mouth/Throat: Oropharynx is clear and moist. No oropharyngeal exudate.  Eyes: Conjunctivae and EOM are normal. Pupils are equal, round, and reactive to light. Right eye exhibits no discharge. Left eye exhibits no discharge. No scleral icterus.  Neck: Normal range of motion. Neck supple. No JVD present. No thyromegaly present.  Cardiovascular: Normal rate, normal heart sounds and intact distal pulses.  Exam reveals no gallop and no friction rub.   No murmur heard. Atrial fibrillation, rate controlled, strong pulses at the  radial arteries, no JVD  Pulmonary/Chest: Effort normal and breath sounds normal. No respiratory distress. He has no wheezes. He has no rales.  No rales, no increased work of breathing, speaks in full sentences  Abdominal: Soft. Bowel sounds are normal. He exhibits no distension and no mass. There is no tenderness.  Morbidly obese, soft nontender abdomen, no anasarca  Musculoskeletal: Normal range of motion. He exhibits edema and tenderness.  Supple joints, bilateral lower extremities with hyperpigmentation, pitting edema bilaterally, left lower extremity with significant swelling on the lateral proximal lower extremity below the knee consistent with hematoma  Lymphadenopathy:    He has no cervical adenopathy.  Neurological: He is alert. Coordination normal.  Able to straight leg raise bilaterally in the bed, speech is clear, cranial nerves III through XII intact, normal strength in the bilateral upper extremities  Skin: Skin is warm  and dry. No rash noted. No erythema.  Psychiatric: He has a normal mood and affect. His behavior is normal.  Nursing note and vitals reviewed.   ED Course  Procedures (including critical care time) Labs Review Labs Reviewed  COMPREHENSIVE METABOLIC PANEL - Abnormal; Notable for the following:    Potassium 5.6 (*)    BUN 32 (*)    Creatinine, Ser 1.65 (*)    AST 470 (*)    ALT 212 (*)    Alkaline Phosphatase 149 (*)    Total Bilirubin 5.2 (*)    GFR calc non Af Amer 41 (*)    GFR calc Af Amer 48 (*)    All other components within normal limits  CBC - Abnormal; Notable for the following:    RBC 3.42 (*)    Hemoglobin 11.8 (*)    HCT 37.3 (*)    MCV 109.1 (*)    MCH 34.5 (*)    Platelets 145 (*)    All other components within normal limits  PROTIME-INR - Abnormal; Notable for the following:    Prothrombin Time 27.3 (*)    INR 2.51 (*)    All other components within normal limits  BRAIN NATRIURETIC PEPTIDE - Abnormal; Notable for the following:     B Natriuretic Peptide 1119.2 (*)    All other components within normal limits  TROPONIN I - Abnormal; Notable for the following:    Troponin I 0.83 (*)    All other components within normal limits  ETHANOL  URINALYSIS, ROUTINE W REFLEX MICROSCOPIC  I-STAT CG4 LACTIC ACID, ED    Imaging Review Dg Tibia/fibula Left  02/05/2015   CLINICAL DATA:  Left leg pain and swelling  EXAM: LEFT TIBIA AND FIBULA - 2 VIEW  COMPARISON:  None.  FINDINGS: There is no evidence of fracture or other focal bone lesions. Soft tissues are unremarkable.  IMPRESSION: No acute osseous injury of the left tibia or fibula.   Electronically Signed   By: Elige Ko   On: 02/05/2015 18:54   Dg Chest Port 1 View  02/06/2015   CLINICAL DATA:  Pain following fall  EXAM: PORTABLE CHEST - 1 VIEW  COMPARISON:  April 26, 2013  FINDINGS: There is no edema or consolidation. Heart is enlarged with pulmonary vascularity within normal limits. No adenopathy. No pneumothorax. No bone lesions.  IMPRESSION: Cardiomegaly.  No edema or consolidation.   Electronically Signed   By: Bretta Bang III M.D.   On: 02/06/2015 09:17     EKG Interpretation   Date/Time:  Wednesday February 06 2015 08:22:32 EDT Ventricular Rate:  79 PR Interval:    QRS Duration: 127 QT Interval:  422 QTC Calculation: 484 R Axis:   -76 Text Interpretation:  Atrial fibrillation Right bundle branch block  Anterolateral infarct, age indeterminate Baseline wander in lead(s) V2 V3  V4 since last tracing no significant change Confirmed by Montez Cuda  MD, Zeus Marquis  [phone]) on 02/06/2015 8:29:28 AM      MDM   Final diagnoses:  SOB (shortness of breath)  Elevated liver enzymes  NSTEMI (non-ST elevated myocardial infarction)  Acute renal failure, unspecified acute renal failure type  Hyperkalemia    The patient has fallen, he has generalized weakness but isolated muscle strength appears normal, mental status is normal, there is mild hypoxia, check chest x-ray,  evaluate for congestive heart failure, would also evaluate ability to ambulate.  The patient does have some hypoxia, mild hypotension, he is not tachycardic or febrile.  His labs have some significant abnormalities including the following  Troponin 0.83 BNP 1100 INR 2.5 Hemoglobin 11.8 Potassium 5.6 Creatinine 1.6 AST 470 ALT 212 .  Alcohol level is undetectable, chest x-ray shows cardiomegaly, the patient is critically ill with multiple lab abnormalities, would consider a shock state, infection, cardiac etiology, cardiology consult at, they will see the patient in consultation, discussed with hospitalist Dr. Izola Price who will admit.  CRITICAL CARE Performed by: Vida Roller Total critical care time: 35 Critical care time was exclusive of separately billable procedures and treating other patients. Critical care was necessary to treat or prevent imminent or life-threatening deterioration. Critical care was time spent personally by me on the following activities: development of treatment plan with patient and/or surrogate as well as nursing, discussions with consultants, evaluation of patient's response to treatment, examination of patient, obtaining history from patient or surrogate, ordering and performing treatments and interventions, ordering and review of laboratory studies, ordering and review of radiographic studies, pulse oximetry and re-evaluation of patient's condition.   Eber Hong, MD 02/06/15 [phone]

## 2015-02-06 NOTE — ED Notes (Signed)
Pt placed on hospital bed

## 2015-02-06 NOTE — ED Notes (Signed)
Brawley Odonald, brother [phone]

## 2015-02-06 NOTE — Progress Notes (Addendum)
ANTIBIOTIC CONSULT NOTE - INITIAL  Pharmacy Consult for vancomycin and zosyn Indication: cellulitis  No Known Allergies  Patient Measurements: weight 145 kg, height 72 inches   Vital Signs: Temp: 97.8 F (36.6 C) (04/27 1421) Temp Source: Oral (04/27 1421) BP: 90/50 mmHg (04/27 1421) Pulse Rate: 57 (04/27 1421) Intake/Output from previous day:   Intake/Output from this shift:    Labs:  Recent Labs  02/05/15 1822 02/06/15 0838  WBC 7.4 8.8  HGB 12.6* 11.8*  PLT 140* 145*  CREATININE 0.90 1.65*   Estimated Creatinine Clearance: 64.3 mL/min (by C-G formula based on Cr of 1.65). No results for input(s): VANCOTROUGH, VANCOPEAK, VANCORANDOM, GENTTROUGH, GENTPEAK, GENTRANDOM, TOBRATROUGH, TOBRAPEAK, TOBRARND, AMIKACINPEAK, AMIKACINTROU, AMIKACIN in the last 72 hours.   Microbiology: No results found for this or any previous visit (from the past 720 hour(s)).  Medical History: Past Medical History  Diagnosis Date  . Cardiomyopathy     nonischemic  . CHF (congestive heart failure)     ejection fractio of 45% per echo in 2011 (previos EF 35-40% in 2003), echo 04/2013  -LVEF 50-55%.  . Alcoholism   . Atrial fibrillation or flutter   . Hypertension   . Obesity   . Ventricular tachycardia     nonsustained, asymptomatic  . Anemia   . Hypoxemia   . GERD (gastroesophageal reflux disease)     takes tums prn  . Complication of anesthesia     April 11, 2013    Medications:  See med rec   Assessment: Patient is a 69 y.o M with hx CM, EtOH abuse, chronic LE swelling, and on coumadin PTA for afib.  He presented to the ED today s/p fall with c/o pain and swelling of LE.  To start vancomycin and zosyn for suspected cellulitis. Scr up 1.78 (crcl~41)   Goal of Therapy:  Vancomycin trough level 10-15 mcg/ml  Plan:  - zosyn 3.375 gm x1 over 30 minutes, then 3.375gm  IV q8h (infuse over 4 hours) - vancomycin 2500 mg x1 load , then 1750 mg IV q24h - f/u cultures and renal  function  Jakyiah Briones P 02/06/2015,2:58 PM   Adden: To resume coumadin for afib.  INR 2.51 today.  Will give 5mg  PO x1 tonight.  F/u with AM labs and adjust dose as needed.  Dorna Leitz, PharmD, BCPS 02/06/2015 9:38 PM

## 2015-02-06 NOTE — ED Notes (Addendum)
Per EMS: pt from home, went to stand from ned to go to restroom and slid down to floor against bed. Pt felt as if legs were to weak to stand on due to fall on yesterday seen for fall. Pt lives at home with 68 yo father who takes care of him. Skin tear to left elbow with this fall.

## 2015-02-06 NOTE — ED Notes (Signed)
Patient is given an urinal and encouraged to void when able.

## 2015-02-06 NOTE — ED Notes (Signed)
Bed: IO96 Expected date:  Expected time:  Means of arrival:  Comments: Fall, SNF placement

## 2015-02-07 ENCOUNTER — Other Ambulatory Visit (HOSPITAL_COMMUNITY): Payer: Self-pay

## 2015-02-07 DIAGNOSIS — D696 Thrombocytopenia, unspecified: Secondary | ICD-10-CM | POA: Diagnosis present

## 2015-02-07 DIAGNOSIS — I5033 Acute on chronic diastolic (congestive) heart failure: Secondary | ICD-10-CM | POA: Diagnosis present

## 2015-02-07 DIAGNOSIS — I214 Non-ST elevation (NSTEMI) myocardial infarction: Secondary | ICD-10-CM

## 2015-02-07 DIAGNOSIS — I10 Essential (primary) hypertension: Secondary | ICD-10-CM

## 2015-02-07 DIAGNOSIS — A419 Sepsis, unspecified organism: Secondary | ICD-10-CM | POA: Diagnosis present

## 2015-02-07 DIAGNOSIS — I481 Persistent atrial fibrillation: Secondary | ICD-10-CM

## 2015-02-07 DIAGNOSIS — N179 Acute kidney failure, unspecified: Secondary | ICD-10-CM

## 2015-02-07 DIAGNOSIS — R0602 Shortness of breath: Secondary | ICD-10-CM

## 2015-02-07 DIAGNOSIS — R7989 Other specified abnormal findings of blood chemistry: Secondary | ICD-10-CM

## 2015-02-07 DIAGNOSIS — K7010 Alcoholic hepatitis without ascites: Secondary | ICD-10-CM | POA: Diagnosis present

## 2015-02-07 DIAGNOSIS — D638 Anemia in other chronic diseases classified elsewhere: Secondary | ICD-10-CM | POA: Diagnosis present

## 2015-02-07 LAB — FOLATE: FOLATE: 6.8 ng/mL

## 2015-02-07 LAB — COMPREHENSIVE METABOLIC PANEL
ALK PHOS: 106 U/L (ref 39–117)
ALT: 147 U/L — ABNORMAL HIGH (ref 0–53)
AST: 218 U/L — ABNORMAL HIGH (ref 0–37)
Albumin: 3 g/dL — ABNORMAL LOW (ref 3.5–5.2)
Anion gap: 7 (ref 5–15)
BUN: 47 mg/dL — ABNORMAL HIGH (ref 6–23)
CHLORIDE: 110 mmol/L (ref 96–112)
CO2: 24 mmol/L (ref 19–32)
Calcium: 7.9 mg/dL — ABNORMAL LOW (ref 8.4–10.5)
Creatinine, Ser: 2.1 mg/dL — ABNORMAL HIGH (ref 0.50–1.35)
GFR calc Af Amer: 36 mL/min — ABNORMAL LOW (ref >90)
GFR, EST NON AFRICAN AMERICAN: 31 mL/min — AB (ref >90)
GLUCOSE: 87 mg/dL (ref 70–99)
POTASSIUM: 5.1 mmol/L (ref 3.5–5.1)
Sodium: 141 mmol/L (ref 135–145)
Total Bilirubin: 2.7 mg/dL — ABNORMAL HIGH (ref 0.3–1.2)
Total Protein: 5.9 g/dL — ABNORMAL LOW (ref 6.0–8.3)

## 2015-02-07 LAB — PROTIME-INR
INR: 3.21 — ABNORMAL HIGH (ref 0.00–1.49)
Prothrombin Time: 33.1 seconds — ABNORMAL HIGH (ref 11.6–15.2)

## 2015-02-07 LAB — URINE CULTURE: Colony Count: >=100000

## 2015-02-07 LAB — RETICULOCYTES
RBC.: 3.14 MIL/uL — AB (ref 4.22–5.81)
RETIC COUNT ABSOLUTE: 59.7 10*3/uL (ref 19.0–186.0)
RETIC CT PCT: 1.9 % (ref 0.4–3.1)

## 2015-02-07 LAB — LIPID PANEL
CHOLESTEROL: 79 mg/dL (ref 0–200)
HDL: 31 mg/dL — ABNORMAL LOW (ref >39)
LDL CALC: 37 mg/dL (ref 0–99)
Total CHOL/HDL Ratio: 2.5 RATIO
Triglycerides: 54 mg/dL (ref <150)
VLDL: 11 mg/dL (ref 0–40)

## 2015-02-07 LAB — CBC
HCT: 34.1 % — ABNORMAL LOW (ref 39.0–52.0)
Hemoglobin: 10.9 g/dL — ABNORMAL LOW (ref 13.0–17.0)
MCH: 34.7 pg — ABNORMAL HIGH (ref 26.0–34.0)
MCHC: 32 g/dL (ref 30.0–36.0)
MCV: 108.6 fL — AB (ref 78.0–100.0)
Platelets: 129 10*3/uL — ABNORMAL LOW (ref 150–400)
RBC: 3.14 MIL/uL — ABNORMAL LOW (ref 4.22–5.81)
RDW: 15.3 % (ref 11.5–15.5)
WBC: 6.8 10*3/uL (ref 4.0–10.5)

## 2015-02-07 LAB — HEMOGLOBIN A1C
Hgb A1c MFr Bld: 5.9 % — ABNORMAL HIGH (ref 4.8–5.6)
MEAN PLASMA GLUCOSE: 123 mg/dL

## 2015-02-07 LAB — IRON AND TIBC
Iron: 22 ug/dL — ABNORMAL LOW (ref 42–165)
SATURATION RATIOS: 11 % — AB (ref 20–55)
TIBC: 192 ug/dL — AB (ref 215–435)
UIBC: 170 ug/dL (ref 125–400)

## 2015-02-07 LAB — MRSA PCR SCREENING: MRSA BY PCR: POSITIVE — AB

## 2015-02-07 LAB — VITAMIN B12: VITAMIN B 12: 789 pg/mL (ref 211–911)

## 2015-02-07 LAB — FERRITIN: Ferritin: 564 ng/mL — ABNORMAL HIGH (ref 22–322)

## 2015-02-07 LAB — TROPONIN I: TROPONIN I: 2.31 ng/mL — AB (ref <0.031)

## 2015-02-07 MED ORDER — SODIUM CHLORIDE 0.9 % IV BOLUS (SEPSIS)
200.0000 mL | Freq: Once | INTRAVENOUS | Status: AC
  Administered 2015-02-07: 200 mL via INTRAVENOUS

## 2015-02-07 MED ORDER — MUPIROCIN 2 % EX OINT
1.0000 "application " | TOPICAL_OINTMENT | Freq: Two times a day (BID) | CUTANEOUS | Status: AC
  Administered 2015-02-07 – 2015-02-12 (×10): 1 via NASAL
  Filled 2015-02-07 (×2): qty 22

## 2015-02-07 MED ORDER — CETYLPYRIDINIUM CHLORIDE 0.05 % MT LIQD
7.0000 mL | Freq: Two times a day (BID) | OROMUCOSAL | Status: DC
  Administered 2015-02-07 – 2015-02-13 (×13): 7 mL via OROMUCOSAL

## 2015-02-07 MED ORDER — CHLORHEXIDINE GLUCONATE CLOTH 2 % EX PADS
6.0000 | MEDICATED_PAD | Freq: Every day | CUTANEOUS | Status: AC
Start: 2015-02-08 — End: 2015-02-13
  Administered 2015-02-08 – 2015-02-12 (×4): 6 via TOPICAL

## 2015-02-07 NOTE — ED Notes (Signed)
Dr. Toniann Fail called and made aware pts BP is now 72/43 and HR 49, new orders for 200cc NS bolus given.

## 2015-02-07 NOTE — Progress Notes (Signed)
CRITICAL VALUE ALERT  Critical value received:  Positive blood cultures,1 anaeroebic and 1 aerobic, gram + cocci in pairs and chains  Date of notification:  02/07/15   Time of notification: 1440  Critical value read back:Yes.    Nurse who received alert:  Everitt Amber  MD notified (1st page): Elisabeth Pigeon  Time of first page:  1455   MD notified (2nd page):  Time of second page:  Responding MD:  Elisabeth Pigeon  Time MD responded:  [phone]

## 2015-02-07 NOTE — ED Notes (Signed)
Pt placed on pacer pads, pt in no distress.

## 2015-02-07 NOTE — Progress Notes (Signed)
UR completed 

## 2015-02-07 NOTE — ED Notes (Signed)
Sleeping, external pacemaker in place-Monitor atrial fib with bradycardic rate in the 50's.  SBP remains in the 80's-patient is pale,skin dry, no distress

## 2015-02-07 NOTE — ED Notes (Signed)
SEE GENERAL COMMUNICATIONS

## 2015-02-07 NOTE — ED Notes (Addendum)
MD CARDIOLOGY at bedside. STATES WILL NOT TRANSFER TO [facility]. WILL NOT DO INTERVENTION AT THIS TIME.

## 2015-02-07 NOTE — Progress Notes (Signed)
CRITICAL VALUE ALERT  Critical value received: MRSA positive  Date of notification:  02/07/2015  Time of notification:  1627  Critical value read back:Yes.    Nurse who received alert:  Everitt Amber  MD notified (1st page): Elisabeth Pigeon  Time of first page:  1628  MD notified (2nd page):  Time of second page:  Responding MD:  Elisabeth Pigeon  Time MD responded:  [phone]

## 2015-02-07 NOTE — Progress Notes (Addendum)
Patient ID: [name], male   DOB: 1945/12/10, 69 y.o.   MRN: [id number] [facility] PROGRESS NOTE  [name] [id number] DOB: October 03, 1946 DOA: 02/06/2015 PCP: Florina Ou, MD  Brief narrative:    69 y.o. male with past medical history of CHF secondary to nonischemic cardiomyopathy, last 2 D ECHO in 04/2013 with EF 50-55%, alcoholism, chronic a. Fib on coumadin therapy, multiple falls, obesity, hypertension, hx of V. Tach who presented to [facility] ED after an episode of fall from the bed few hours PTA. Per pt this was mechanical fall and no prodromal symptoms noted prior to fall. EMS arrived to his home and per their report he was covered in urine and trash. Pt lives with his father who is 30.  On admission, pt apparently confused. His BP was 85/54, T max 99.3 F and HR 50-128, RR 14-22, O2 sat 88% on room air. Blood work was significant for troponin of 0.83, BNP 1100, INR 2.51, Hgb 11.8, K 5.6, Cr 1.6, BUN 32, AST 470, ALT 212, ALK 149, and lactic acid as high as 2.1. The 12 lead EKG showed a fib with rate of 79. Per cardio, no acute intervention indicated except to continue to monitor on telemetry.   Barrier to discharge: on IV abx for sepsis secondary to cellulitis. Hypotensive and needs to stay in SDU for now. Also need to follow up on final blood culture results since the cultures from adm shows GPC in chains.   Assessment/Plan:    Principal Problem: Sepsis with multiple organ dysfunction syndrome / Sepsis due to lower extremity cellulitis / Gram positive cocci bacteremia  - Sepsis criteria met on admission with tachycardia, tachypnea, hypoxia, hypotension. In addition, lactic acid was 2.1 and procalcitonin 1.0. Source of infection - lower extremity cellulitis and possibly UTI since pt did have trace leukocytes on urinalysis.  - Continue vanco and zosyn for now, will cover for cellulitis and UTI. - Blood cultures growing gram positive cocci in pairs and chains. Will follow up on  final report. Current abx should cover for this gram positive bacteremia. - Pt still hypotensive but does not require pressor support. - Home BP meds on hold until BP stabilizes (home meds: lasix, coreg, benicar) - Continue to monitor in SDU.  Active Problems: NSTEMI (non-ST elevated myocardial infarction) - Likely demand ischemia from acute resp failure with hypoxia and sepsis - Trop elevation, 0.83; subsequent levels 2.36 and 2.84 - The 12 lead EKG showed atrial fibrillation with rate in 70's, bifascicular block, right bundle branch block, left anterior hemiblock, poor R-wave progression across the lateral precordial leads - Per cardiology, pt is not a candidate for left heart cardiac catheterization at this point - We will continue to cycle cardiac enzymes - On AC with coumadin   Acute hypoxic respiratory failure - Likely from demand ischemia - Respiratory status stable - Continue oxygen support via [place] to keep O2 sat above 90%  Acute on chronic diastolic CHF - 2 D ECHO on this admission showed grade 3 diastolic dysfunction with preserved EF - BNP on the admission 1119.  - Continue daily weight and strict intake and output - Cant get lasix due to hypotension, SBP in 80's.  Hyperkalemia - Pt has been on oral supplementation at home which could have contributed to hyperkalemia - Potassium now WNL  Acute renal failure - Possibly from sepsis or benicar, lasix. Those meds on hold - Continue to monitor renal function.  - Creatinine 2.1 this morning, up from  admission value of 1.65  Atrial fibrillation with RVR - CHADS vasc score at least 4 - Rate controlled on coreg but coreg now on hold due to hypotension - AC with coumadin per pharmacy dosing  Elevated liver enzymes - Secondary to alcohol induced hepatitis - LFT's better since admission Hepatic Function 02/07/2015 02/06/2015 02/06/2015  AST 218(H) 490(H) 470(H)  ALT 147(H) 209(H) 212(H)  Alk Phosphatase 106 147(H) 149(H)   Total Bilirubin 2.7(H) 4.4(H) 5.2(H)  - Abd [place] did not show acute cholecystitis.  Alcohol abuse - Started on CIWA - No reports of withdrawals    Anemia of chronic disease, macrocytic - Likely due to alcohol abuse  - Hemoglobin stable at 10.9 - No indications for transfusion   Thrombocytopenia - From bone marrow suppression from alcohol abuse - No reports of bleeding - Platelets 129  Dyslipidemia  - Hold statin therapy until LFT's improve   Morbid obesity - Body mass index is 43.91 kg/(m^2). - Counseled on diet    DVT Prophylaxis  - On anticoagulation with coumadin   Code Status: Full.  Family Communication:  plan of care discussed with the patient Disposition Plan: Home once medically stable hopefully in next 2-3 days.   IV access:  Peripheral IV  Procedures and diagnostic studies:    Dg Tibia/fibula Left 17-Feb-2015  No acute osseous injury of the left tibia or fibula.     US Abdomen Complete 02/06/2015   A few small gallstones are identified but there is no evidence of acute cholecystitis. The examination is otherwise negative.     Dg Chest Port 1 View 02/06/2015   Cardiomegaly.  No edema or consolidation.     Medical Consultants:  None   Other Consultants:  Physical therapy   IAnti-Infectives:   Zosyn 02/06/2015 --> Vanco 02/06/2015 -->   Richard Switalski, MD  [facility] Pager [phone]  Time spent in minutes: 25 minutes  If 7PM-7AM, please contact night-coverage [email or web address] Password [email or web address] 02/07/2015, 2:01 PM   LOS: 1 day    HPI/Subjective: No acute overnight events. Patient reports pain in legs this am, no weakness.   Objective: Filed Vitals:   02/07/15 0900 02/07/15 1024 02/07/15 1026 02/07/15 1115  BP: $Re'92/62 83/48 87/46 'pGb$ 98/64  Pulse: 67 64 62 62  Temp:  97.4 F (36.3 C)  97.6 F (36.4 C)  TempSrc:  Oral  Oral  Resp: $Remo'17 14  13  'zrJds$ Height:    6' (1.829 m)  Weight:    146.9 kg (323 lb 13.7 oz)  SpO2: 99% 100%  100%    Intake/Output  Summary (Last 24 hours) at 02/07/15 1401 Last data filed at 02/07/15 1028  Gross per 24 hour  Intake    350 ml  Output    100 ml  Net    250 ml    Exam:   General:  Pt is alert, follows commands appropriately, not in acute distress  Cardiovascular: Regular rate and rhythm, S1/S2 (+)  Respiratory: Clear to auscultation bilaterally, no wheezing, no crackles, no rhonchi  Abdomen: Soft, non tender, non distended, bowel sounds present  Extremities: LE pitting (+2) edema with erythema and superimposed chronic skin changes, pulses DP and PT palpable bilaterally  Neuro: Grossly nonfocal  Data Reviewed: Basic Metabolic Panel:  Recent Labs Lab 2015-02-17 1822 02/06/15 0838 02/06/15 1400 02/07/15 0508  NA 142 140 143 141  K 4.7 5.6* 5.4* 5.1  CL 109 109 109 110  CO2 $Re'25 21 24 24  'vEK$ GLUCOSE 89 94 97 87  BUN 22 32* 39* 47*  CREATININE 0.90 1.65* 1.78* 2.10*  CALCIUM 8.9 8.4 8.5 7.9*  MG  --   --  2.0  --   PHOS  --   --  5.5*  --    Liver Function Tests:  Recent Labs Lab 02/05/15 1822 02/06/15 0838 02/06/15 1400 02/07/15 0508  AST 27 470* 490* 218*  ALT 16 212* 209* 147*  ALKPHOS 150* 149* 147* 106  BILITOT 4.1* 5.2* 4.4* 2.7*  PROT 7.6 7.2 7.2 5.9*  ALBUMIN 3.7 3.6 3.6 3.0*   No results for input(s): LIPASE, AMYLASE in the last 168 hours. No results for input(s): AMMONIA in the last 168 hours. CBC:  Recent Labs Lab 02/05/15 1822 02/06/15 0838 02/07/15 0508  WBC 7.4 8.8 6.8  NEUTROABS 5.9  --   --   HGB 12.6* 11.8* 10.9*  HCT 39.4 37.3* 34.1*  MCV 107.4* 109.1* 108.6*  PLT 140* 145* 129*   Cardiac Enzymes:  Recent Labs Lab 02/06/15 0838 02/06/15 1400 02/06/15 1502 02/06/15 1933  TROPONINI 0.83* 2.48* 2.36* 2.84*   BNP: Invalid input(s): POCBNP CBG: No results for input(s): GLUCAP in the last 168 hours.  Recent Results (from the past 240 hour(s))  Culture, blood (x 2)     Status: None (Preliminary result)   Collection Time: 02/06/15  3:01 PM   Result Value Ref Range Status   Specimen Description BLOOD RIGHT HAND  Final   Special Requests BOTTLES DRAWN AEROBIC AND ANAEROBIC 5CC  Final   Culture   Final           BLOOD CULTURE RECEIVED NO GROWTH TO DATE CULTURE WILL BE HELD FOR 5 DAYS BEFORE ISSUING A FINAL NEGATIVE REPORT Performed at [facility]    Report Status PENDING  Incomplete  Culture, blood (x 2)     Status: None (Preliminary result)   Collection Time: 02/06/15  3:16 PM  Result Value Ref Range Status   Specimen Description BLOOD LEFT ANTECUBITAL  Final   Special Requests BOTTLES DRAWN AEROBIC AND ANAEROBIC 5CC  Final   Culture   Final           BLOOD CULTURE RECEIVED NO GROWTH TO DATE CULTURE WILL BE HELD FOR 5 DAYS BEFORE ISSUING A FINAL NEGATIVE REPORT Performed at [facility]    Report Status PENDING  Incomplete     Scheduled Meds: . ferrous sulfate  325 mg Oral Daily  . piperacillin-tazobactam (ZOSYN)  IV  3.375 g Intravenous Q8H  . prednisoLONE acetate  1 drop Right Eye BID  . sodium chloride  3 mL Intravenous Q12H  . timolol  1 drop Right Eye BID  . vancomycin  1,750 mg Intravenous Q24H  . Warfarin - Pharmacist Dosing Inpatient   Does not apply q1800   Continuous Infusions:

## 2015-02-07 NOTE — ED Notes (Signed)
Talked to Dr. Toniann Fail, stated to place pt on pacer pads and to monitor closely.

## 2015-02-07 NOTE — ED Notes (Signed)
Pts HR dropped into 30's, pt a/o x 4, resting in bed, denies any pain, BP systolic in 80's, will page admitting MD to make aware.

## 2015-02-07 NOTE — Progress Notes (Signed)
Patient Name: [name] Date of Encounter: 02/07/2015  Principal Problem:   NSTEMI (non-ST elevated myocardial infarction) Active Problems:   Essential hypertension   Anemia   Hematoma   Atrial fibrillation   Elevated liver enzymes   Respiratory distress   Cardiomegaly   Elevated troponin   Length of Stay: 1  SUBJECTIVE  The patient denies CP or SOB, feels tired, better than yesterday  CURRENT MEDS . ferrous sulfate  325 mg Oral Daily  . piperacillin-tazobactam (ZOSYN)  IV  3.375 g Intravenous Q8H  . prednisoLONE acetate  1 drop Right Eye BID  . sodium chloride  3 mL Intravenous Q12H  . timolol  1 drop Right Eye BID  . vancomycin  1,750 mg Intravenous Q24H  . Warfarin - Pharmacist Dosing Inpatient   Does not apply q1800    OBJECTIVE  Filed Vitals:   02/07/15 0900 02/07/15 1024 02/07/15 1026 02/07/15 1115  BP: 92/62 83/48 87/46  98/64  Pulse: 67 64 62 62  Temp:  97.4 F (36.3 C)  97.6 F (36.4 C)  TempSrc:  Oral  Oral  Resp: 17 14  13   Height:    6' (1.829 m)  Weight:    323 lb 13.7 oz (146.9 kg)  SpO2: 99% 100%  100%    Intake/Output Summary (Last 24 hours) at 02/07/15 1444 Last data filed at 02/07/15 1028  Gross per 24 hour  Intake    350 ml  Output    100 ml  Net    250 ml   Filed Weights   02/07/15 1115  Weight: 323 lb 13.7 oz (146.9 kg)    PHYSICAL EXAM  General: Pleasant, NAD. Neuro: Alert and oriented X 3. Moves all extremities spontaneously. Psych: Normal affect. HEENT:  Normal  Neck: Supple without bruits or JVD. Lungs:  Resp regular and unlabored, CTA. Heart: RRR no s3, s4, or murmurs. Abdomen: Soft, non-tender, non-distended, BS + x 4.  Extremities: No clubbing, cyanosis or edema. DP/PT/Radials 2+ and equal bilaterally.  Accessory Clinical Findings  CBC  Recent Labs  02/05/15 1822 02/06/15 0838 02/07/15 0508  WBC 7.4 8.8 6.8  NEUTROABS 5.9  --   --   HGB 12.6* 11.8* 10.9*  HCT 39.4 37.3* 34.1*  MCV 107.4* 109.1*  108.6*  PLT 140* 145* 129*   Basic Metabolic Panel  Recent Labs  02/06/15 1400 02/07/15 0508  NA 143 141  K 5.4* 5.1  CL 109 110  CO2 24 24  GLUCOSE 97 87  BUN 39* 47*  CREATININE 1.78* 2.10*  CALCIUM 8.5 7.9*  MG 2.0  --   PHOS 5.5*  --    Liver Function Tests  Recent Labs  02/06/15 1400 02/07/15 0508  AST 490* 218*  ALT 209* 147*  ALKPHOS 147* 106  BILITOT 4.4* 2.7*  PROT 7.2 5.9*  ALBUMIN 3.6 3.0*   No results for input(s): LIPASE, AMYLASE in the last 72 hours. Cardiac Enzymes  Recent Labs  02/06/15 1400 02/06/15 1502 02/06/15 1933  TROPONINI 2.48* 2.36* 2.84*   BNP Invalid input(s): POCBNP D-Dimer No results for input(s): DDIMER in the last 72 hours. Hemoglobin A1C  Recent Labs  02/06/15 1400  HGBA1C 5.9*   Fasting Lipid Panel  Recent Labs  02/07/15 0508  CHOL 79  HDL 31*  LDLCALC 37  TRIG 54  CHOLHDL 2.5   Thyroid Function Tests  Recent Labs  02/06/15 1400  TSH 1.163    Dg Chest Port 1 View  02/06/2015  CLINICAL DATA:  Pain following fall  EXAM: PORTABLE CHEST - 1 VIEW  COMPARISON:  April 26, 2013  FINDINGS: There is no edema or consolidation. Heart is enlarged with pulmonary vascularity within normal limits. No adenopathy. No pneumothorax. No bone lesions.  IMPRESSION: Cardiomegaly.  No edema or consolidation.   Electronically Signed   By: Bretta Bang III M.D.   On: 02/06/2015 09:17    TELE: a-fib with slow ventricular response  ECG: a-fib, NIVCD, old inferior MI, unchanged from 2014   Echo: 02/07/2015 - Left ventricle: The cavity size was normal. Systolic function was normal. The estimated ejection fraction was in the range of 55% to 60%. Wall motion was normal; there were no regional wall motion abnormalities. There was a reduced contribution of atrial contraction to ventricular filling, due to increased ventricular diastolic pressure or atrial contractile dysfunction. Doppler parameters are consistent  with a reversible restrictive pattern, indicative of decreased left ventricular diastolic compliance and/or increased left atrial pressure (grade 3 diastolic dysfunction). - Aortic valve: Trileaflet; mildly thickened, mildly calcified leaflets. - Mitral valve: Calcified annulus. Mild, late systolicprolapse, involving the anterior leaflet. There was mild to moderate regurgitation directed eccentrically. - Right ventricle: The cavity size was moderately dilated. Wall thickness was normal. Systolic function was severely reduced. - Pulmonary arteries: PA peak pressure: 40 mm Hg (S).  Impressions: - The right ventricular systolic pressure was increased consistent with moderate pulmonary hypertension.    ASSESSMENT AND PLAN  Principal Problem:  NSTEMI (non-ST elevated myocardial infarction) Active Problems:  Essential hypertension  Anemia  Hematoma  Atrial fibrillation  Elevated liver enzymes  Respiratory distress  Cardiomegaly  Elevated troponin  68 y.o. 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 Fib on coumadin therapy, multiple fall, obesity, hypertension, hx of V. Tach, anemia, hypoxia, GERD, who was consulted for possible cardiac etiology of multiple fall and multiple lab abnormalities. The patient has elevated troponin, however completely asymptomatic and unchanged ECG from 2014, echocardiogram showed normal LVEF 55-60% with no regional wall motion abnormalities.  He has grade 3 diastolic dysfunction with elevated filling pressures, he has LE edema, clear lungs. Hold diuretics in the settings of acute kidney failure.Marland Kitchen His a-fib is chronic and rate controlled.  Likely demand t ischemia due to AKI.  Continue BB, stain and PO lasix. Coumadin per pharmacy. Hold Benicar in a setting of AKI.  Continue therapy for cellulitis. We won't be planning a cath in the settings of acute infection and acute on  chronic kidney failure, we will follow and perform a stress test once his acute issues are resolved.   We will follow.  Signed, Lars Masson MD, [facility] 02/07/2015

## 2015-02-07 NOTE — Progress Notes (Signed)
ANTICOAGULATION CONSULT NOTE - Follow Up Consult  Pharmacy Consult for Coumadin Indication: atrial fibrillation  No Known Allergies  Patient Measurements: Height: 6' (182.9 cm) Weight: (!) 323 lb 13.7 oz (146.9 kg) IBW/kg (Calculated) : 77.6  Vital Signs: Temp: 97.6 F (36.4 C) (04/28 1115) Temp Source: Oral (04/28 1115) BP: 98/64 mmHg (04/28 1115) Pulse Rate: 62 (04/28 1115)  Labs:  Recent Labs  02/05/15 1822  02/06/15 0838 02/06/15 1400 02/06/15 1502 02/06/15 1933 02/07/15 0508  HGB 12.6*  --  11.8*  --   --   --  10.9*  HCT 39.4  --  37.3*  --   --   --  34.1*  PLT 140*  --  145*  --   --   --  129*  LABPROT 24.3*  --  27.3*  --   --   --  33.1*  INR 2.16*  --  2.51*  --   --   --  3.21*  CREATININE 0.90  --  1.65* 1.78*  --   --  2.10*  TROPONINI  --   < > 0.83* 2.48* 2.36* 2.84*  --   < > = values in this interval not displayed.  Estimated Creatinine Clearance: 50.8 mL/min (by C-G formula based on Cr of 2.1).   Medications:  Scheduled:  . ferrous sulfate  325 mg Oral Daily  . piperacillin-tazobactam (ZOSYN)  IV  3.375 g Intravenous Q8H  . prednisoLONE acetate  1 drop Right Eye BID  . sodium chloride  3 mL Intravenous Q12H  . timolol  1 drop Right Eye BID  . vancomycin  1,750 mg Intravenous Q24H  . Warfarin - Pharmacist Dosing Inpatient   Does not apply q1800    Assessment: Patient is a 69 y.o M with hx CM, EtOH abuse, chronic LE swelling, and on coumadin PTA for afib. He presented to the ED today s/p fall with c/o pain and swelling of LE. Coumadin per pharmacy. Home dose reported as 2.5mg  daily except 5mg  on MWF.   INR rose to supratherapeutic today after 5mg  dose last night.   CBC about the same. No bleeding reported/documented.  Reg diet ordered.  Broad-spectrum abx will increase sensitivity to Coumadin.  Goal of Therapy:  INR 2-3 Monitor platelets by anticoagulation protocol: Yes   Plan:  No Coumadin today. F/u daily INR.  Charolotte Eke,  PharmD, pager [phone]. 02/07/2015,12:02 PM.

## 2015-02-07 NOTE — Progress Notes (Signed)
  Echocardiogram 2D Echocardiogram has been performed.  Cathie Beams 02/07/2015, 1:36 PM

## 2015-02-08 DIAGNOSIS — D638 Anemia in other chronic diseases classified elsewhere: Secondary | ICD-10-CM

## 2015-02-08 DIAGNOSIS — I248 Other forms of acute ischemic heart disease: Secondary | ICD-10-CM

## 2015-02-08 DIAGNOSIS — I5033 Acute on chronic diastolic (congestive) heart failure: Secondary | ICD-10-CM

## 2015-02-08 DIAGNOSIS — K7010 Alcoholic hepatitis without ascites: Secondary | ICD-10-CM

## 2015-02-08 LAB — CBC
HCT: 35.1 % — ABNORMAL LOW (ref 39.0–52.0)
HEMOGLOBIN: 11.1 g/dL — AB (ref 13.0–17.0)
MCH: 34.4 pg — AB (ref 26.0–34.0)
MCHC: 31.6 g/dL (ref 30.0–36.0)
MCV: 108.7 fL — ABNORMAL HIGH (ref 78.0–100.0)
Platelets: 143 10*3/uL — ABNORMAL LOW (ref 150–400)
RBC: 3.23 MIL/uL — AB (ref 4.22–5.81)
RDW: 15.1 % (ref 11.5–15.5)
WBC: 5.7 10*3/uL (ref 4.0–10.5)

## 2015-02-08 LAB — COMPREHENSIVE METABOLIC PANEL
ALT: 137 U/L — AB (ref 0–53)
AST: 163 U/L — ABNORMAL HIGH (ref 0–37)
Albumin: 2.8 g/dL — ABNORMAL LOW (ref 3.5–5.2)
Alkaline Phosphatase: 116 U/L (ref 39–117)
Anion gap: 6 (ref 5–15)
BILIRUBIN TOTAL: 2.7 mg/dL — AB (ref 0.3–1.2)
BUN: 45 mg/dL — ABNORMAL HIGH (ref 6–23)
CALCIUM: 8.1 mg/dL — AB (ref 8.4–10.5)
CHLORIDE: 111 mmol/L (ref 96–112)
CO2: 25 mmol/L (ref 19–32)
Creatinine, Ser: 1.76 mg/dL — ABNORMAL HIGH (ref 0.50–1.35)
GFR calc Af Amer: 44 mL/min — ABNORMAL LOW (ref >90)
GFR calc non Af Amer: 38 mL/min — ABNORMAL LOW (ref >90)
Glucose, Bld: 89 mg/dL (ref 70–99)
POTASSIUM: 4.7 mmol/L (ref 3.5–5.1)
SODIUM: 142 mmol/L (ref 135–145)
Total Protein: 6 g/dL (ref 6.0–8.3)

## 2015-02-08 LAB — TROPONIN I
TROPONIN I: 1.54 ng/mL — AB (ref <0.031)
Troponin I: 1.88 ng/mL (ref <0.031)

## 2015-02-08 LAB — PROTIME-INR
INR: 3.42 — AB (ref 0.00–1.49)
Prothrombin Time: 34.7 seconds — ABNORMAL HIGH (ref 11.6–15.2)

## 2015-02-08 MED ORDER — OXYCODONE-ACETAMINOPHEN 5-325 MG PO TABS
1.0000 | ORAL_TABLET | ORAL | Status: DC | PRN
  Administered 2015-02-08 – 2015-02-13 (×10): 1 via ORAL
  Filled 2015-02-08 (×10): qty 1

## 2015-02-08 NOTE — Progress Notes (Signed)
ANTICOAGULATION CONSULT NOTE - Follow Up Consult  Pharmacy Consult for Coumadin Indication: atrial fibrillation  No Known Allergies  Patient Measurements: Height: 6' (182.9 cm) Weight: (!) 326 lb 8 oz (148.1 kg) IBW/kg (Calculated) : 77.6  Vital Signs: Temp: 98.4 F (36.9 C) (04/29 0800) Temp Source: Oral (04/29 0800) BP: 121/74 mmHg (04/29 1201) Pulse Rate: 69 (04/29 1201)  Labs:  Recent Labs  02/06/15 0838 02/06/15 1400  02/07/15 0508 02/07/15 1756 02/07/15 2320 02/08/15 0530  HGB 11.8*  --   --  10.9*  --   --  11.1*  HCT 37.3*  --   --  34.1*  --   --  35.1*  PLT 145*  --   --  129*  --   --  143*  LABPROT 27.3*  --   --  33.1*  --   --  34.7*  INR 2.51*  --   --  3.21*  --   --  3.42*  CREATININE 1.65* 1.78*  --  2.10*  --   --  1.76*  TROPONINI 0.83* 2.48*  < >  --  2.31* 1.88* 1.54*  < > = values in this interval not displayed.  Estimated Creatinine Clearance: 60.9 mL/min (by C-G formula based on Cr of 1.76).   Medications:  Scheduled:  . antiseptic oral rinse  7 mL Mouth Rinse BID  . Chlorhexidine Gluconate Cloth  6 each Topical Q0600  . ferrous sulfate  325 mg Oral Daily  . mupirocin ointment  1 application Nasal BID  . piperacillin-tazobactam (ZOSYN)  IV  3.375 g Intravenous Q8H  . prednisoLONE acetate  1 drop Right Eye BID  . sodium chloride  3 mL Intravenous Q12H  . timolol  1 drop Right Eye BID  . vancomycin  1,750 mg Intravenous Q24H  . Warfarin - Pharmacist Dosing Inpatient   Does not apply q1800    Assessment: Patient is a 69 y.o M with hx CM, EtOH abuse, chronic LE swelling, and on coumadin PTA for afib. He presented to the ED today s/p fall with c/o pain and swelling of LE. Coumadin per pharmacy. Home dose reported as 2.5mg  daily except 5mg  on MWF.   INR 4/28 supratherapeutic after 5mg  dose 4/27  INR increased further today   CBC unchanged, low,stable. No bleeding reported/documented.  Reg diet ordered.  Broad-spectrum abx will  increase sensitivity to Coumadin.  Goal of Therapy:  INR 2-3 Monitor platelets by anticoagulation protocol: Yes   Plan:  No Coumadin today. F/u daily INR.  Otho Bellows PharmD Pager [phone] 02/08/2015, 12:17 PM

## 2015-02-08 NOTE — Progress Notes (Signed)
Received a call from Quintella Baton, who is married to the patients brother, Jagraj Douglas. She verbalized concern regarding high needs of the patient at home.  She and her husband are concerned about the patient's 68 year old father, stating that he is fairly healthy, but unable to provide the amount of care needed at this time to the patient.  Alvino Chapel asked if either a SW or Case Manager can look into potentially receiving more care upon discharge and considering Rehab again.  She stated that he did quite well at [facility].  She verbalized concern for the patients 84 yr old father.  Pt's brother Carney Bern can be reached at [phone] and His wife Alvino Chapel at [phone]

## 2015-02-08 NOTE — Progress Notes (Addendum)
Patient ID: [name], male   DOB: 08-15-1946, 69 y.o.   MRN: [id number] [facility] PROGRESS NOTE  [name]:[id number] DOB: 1946-01-17 DOA: 02/06/2015 PCP: Florina Ou, MD  Brief narrative:    69 y.o. male with past medical history of CHF secondary to nonischemic cardiomyopathy, last 2 D ECHO in 04/2013 with EF 50-55%, alcoholism, chronic a. Fib on coumadin therapy, multiple falls, obesity, hypertension, hx of V. Tach who presented to [facility] ED after an episode of fall from the bed few hours PTA. Per pt this was mechanical fall and no prodromal symptoms noted prior to fall. EMS arrived to his home and per their report he was covered in urine and trash. Pt lives with his father who is 89.  On admission, patient was hypotensive, low-grade fever, tachycardic with hypoxia. He had a brief moment of confusion. His blood work was notable for troponin of 0.83,  BNP 1100, INR 2.51, Hgb 11.8, K 5.6, Cr 1.6, BUN 32, AST 470, ALT 212, ALK 149, and lactic acid as high as 2.1. The 12 lead EKG showed a fib with rate of 79.   Barrier to discharge: Patient is hypotensive and remains in step down unit. He does not require pressor support however. He is also getting IV antibiotics for sepsis.   Assessment/Plan:    Principal Problem: Sepsis with multiple organ dysfunction syndrome / Sepsis due to lower extremity cellulitis / Gram positive cocci bacteremia  - Sepsis criteria met on admission with tachycardia, tachypnea, hypoxia, hypotension. Lactic acid was 2.1 and procalcitonin 1.0. Source of infection likely lower extremity cellulitis and possibly UTI since pt did have trace leukocytes on urinalysis. Also note, blood cultures on the admission are growing gram-positive cocci in pairs and chains but final report is not yet available. - Urine culture on the admission produced multiple bacterial morphologies, none predominant. - We will continue to monitor patient in step down unit because of  hypotension. His home blood pressure medications include Lasix, Coreg and Benicar all of which are on hold because of hypotension. Please note that the patient however does not require pressor support. - Continue vanco and zosyn for now, will cover for cellulitis and UTI.  Active Problems: NSTEMI (non-ST elevated myocardial infarction) - Likely demand ischemia from acute resp failure with hypoxia and sepsis - Trop level reached plateau at 2.31 and now trending down, 1.54 today. - 2 D ECHO showed normal left ventricular ejection fraction with no regional wall abnormalities, grade 3 diastolic dysfunction. - Has had bradycardia down to 30 overnight but he is asymptomatic. His blood pressure medications are on hold because of hypotension and renal insufficiency. - Appreciate cardiology recommendations. At this time, cardiology does not plan cardiac catheterization in the setting of acute infection and acute on chronic kidney failure. Plan for stress test once acute issues are resolved.  Acute hypoxic respiratory failure - Likely from demand ischemia in the setting of sepsis. - Respiratory status remains stable. Oxygen saturation is 96% with nasal cannula oxygen support.   Acute on chronic diastolic CHF - 2 D ECHO on this admission showed grade 3 diastolic dysfunction with preserved EF - BNP on the admission 1119.  - Lasix, Coreg and Benicar all on hold because of hypotension and renal insufficiency. - Appreciate cardiology following.  Hyperkalemia - Pt has been on oral supplementation at home which could have contributed to hyperkalemia - Potassium within normal limits.   Acute renal failure - Possibly from sepsis or benicar, lasix. All  those medications were placed on hold at the time of the admission. - Creatinine is 1.76 this morning. - continue to monitor renal function.  Atrial fibrillation with RVR - CHADS vasc score at least 4 - Has had bradycardia overnight. Asymptomatic. -  Coreg on hold because of hypotension. - AC with coumadin per pharmacy dosing  Elevated liver enzymes - Secondary to alcohol induced hepatitis - LFT's better since admission. AST 470 --> 218; ALT 212 --> 147; TB 5.2 --> 2.7; ALP 149 --> WNL - Abd [place] did not show acute cholecystitis.  Alcohol abuse - Started on CIWA - No reports of withdrawals.   Anemia of chronic disease, macrocytic - likely secondary to bone marrow suppression from history of alcohol abuse. - hemoglobin is 11.1. No current indications for transfusion.  Thrombocytopenia - Likely secondary to bone marrow suppression from history of alcohol abuse. Platelet count is 143 today.  Dyslipidemia  - Statin therapy on until LFT's improve   Morbid obesity - Body mass index is 44.27 kg/(m^2). - Counseled on diet    DVT Prophylaxis  - On anticoagulation with coumadin   Code Status: Full.  Family Communication:  plan of care discussed with the patient Disposition Plan: Patient is still hypotensive, remains in step down unit. Not stable for discharge at this time.  IV access:  Peripheral IV  Procedures and diagnostic studies:    Dg Tibia/fibula Left 2015/02/16  No acute osseous injury of the left tibia or fibula.     US Abdomen Complete 02/06/2015   A few small gallstones are identified but there is no evidence of acute cholecystitis. The examination is otherwise negative.     Dg Chest Port 1 View 02/06/2015   Cardiomegaly.  No edema or consolidation.     Medical Consultants:  Cardiology Dr. Ena Dawley   Other Consultants:  Physical therapy   IAnti-Infectives:   Zosyn 02/06/2015 --> Vanco 02/06/2015 -->   Leisa Lenz, MD  [facility] Pager [phone]  Time spent in minutes: 25 minutes  If 7PM-7AM, please contact night-coverage [email or web address] Password [facility] 02/08/2015, 10:49 AM   LOS: 2 days    HPI/Subjective: No acute overnight events. Has had bradycardia overnight but  asymptomatic.   Objective: Filed Vitals:   02/08/15 0800 02/08/15 1000 02/08/15 1027 02/08/15 1034  BP: 102/69 69/41 70/46 83/50  Pulse: 67 69 66 67  Temp: 98.4 F (36.9 C)     TempSrc: Oral     Resp: _0 Height:      Weight:      SpO2: 96% 88% 90% 96%    Intake/Output Summary (Last 24 hours) at 02/08/15 1049 Last data filed at 02/08/15 0920  Gross per 24 hour  Intake   1103 ml  Output    950 ml  Net    153 ml    Exam:   General:  Pt is alert, no acute distress  Cardiovascular: Rate controlled, S1/S2 (+)  Respiratory: Bilateral air entry, no wheezing  Abdomen: Obese abdomen, non-tender, appreciate bowel sounds  Extremities: Lower extremity +2 pitting edema in addition to superimposed chronic skin changes, venous stasis. Pulses palpable.  Neuro: No focal neurologic deficits.  Data Reviewed: Basic Metabolic Panel:  Recent Labs Lab 02/16/15 1822 02/06/15 0838 02/06/15 1400 02/07/15 0508 02/08/15 0530  NA 142 140 143 141 142  K 4.7 5.6* 5.4* 5.1 4.7  CL 109 109 109 110 111  CO2 _1 GLUCOSE 89 94 97 87  89  BUN 22 32* 39* 47* 45*  CREATININE 0.90 1.65* 1.78* 2.10* 1.76*  CALCIUM 8.9 8.4 8.5 7.9* 8.1*  MG  --   --  2.0  --   --   PHOS  --   --  5.5*  --   --    Liver Function Tests:  Recent Labs Lab 02/05/15 1822 02/06/15 0838 02/06/15 1400 02/07/15 0508 02/08/15 0530  AST 27 470* 490* 218* 163*  ALT 16 212* 209* 147* 137*  ALKPHOS 150* 149* 147* 106 116  BILITOT 4.1* 5.2* 4.4* 2.7* 2.7*  PROT 7.6 7.2 7.2 5.9* 6.0  ALBUMIN 3.7 3.6 3.6 3.0* 2.8*   No results for input(s): LIPASE, AMYLASE in the last 168 hours. No results for input(s): AMMONIA in the last 168 hours. CBC:  Recent Labs Lab 02/05/15 1822 02/06/15 0838 02/07/15 0508 02/08/15 0530  WBC 7.4 8.8 6.8 5.7  NEUTROABS 5.9  --   --   --   HGB 12.6* 11.8* 10.9* 11.1*  HCT 39.4 37.3* 34.1* 35.1*  MCV 107.4* 109.1* 108.6* 108.7*  PLT 140* 145* 129* 143*    Cardiac Enzymes:  Recent Labs Lab 02/06/15 1502 02/06/15 1933 02/07/15 1756 02/07/15 2320 02/08/15 0530  TROPONINI 2.36* 2.84* 2.31* 1.88* 1.54*   BNP: Invalid input(s): POCBNP CBG: No results for input(s): GLUCAP in the last 168 hours.  Recent Results (from the past 240 hour(s))  Urine culture     Status: None   Collection Time: 02/06/15 12:27 PM  Result Value Ref Range Status   Specimen Description URINE, CLEAN CATCH  Final   Special Requests NONE  Final   Colony Count   Final    >=100,000 COLONIES/ML Performed at [facility]    Culture   Final    Multiple bacterial morphotypes present, none predominant. Suggest appropriate recollection if clinically indicated. Performed at [facility]    Report Status 02/07/2015 FINAL  Final  Culture, blood (x 2)     Status: None (Preliminary result)   Collection Time: 02/06/15  3:01 PM  Result Value Ref Range Status   Specimen Description BLOOD RIGHT HAND  Final   Special Requests BOTTLES DRAWN AEROBIC AND ANAEROBIC 5CC  Final   Culture   Final    GRAM POSITIVE COCCI IN PAIRS AND CHAINS Note: Gram Stain Report Called to,Read Back By and Verified With: KENEISHA RN ON 2W AT 1430 [id number] BY CASTC Performed at [facility]    Report Status PENDING  Incomplete  Culture, blood (x 2)     Status: None (Preliminary result)   Collection Time: 02/06/15  3:16 PM  Result Value Ref Range Status   Specimen Description BLOOD LEFT ANTECUBITAL  Final   Special Requests BOTTLES DRAWN AEROBIC AND ANAEROBIC 5CC  Final   Culture   Final           BLOOD CULTURE RECEIVED NO GROWTH TO DATE CULTURE WILL BE HELD FOR 5 DAYS BEFORE ISSUING A FINAL NEGATIVE REPORT Performed at [facility]    Report Status PENDING  Incomplete  MRSA PCR Screening     Status: Abnormal   Collection Time: 02/07/15 11:15 AM  Result Value Ref Range Status   MRSA by PCR POSITIVE (A) NEGATIVE Final     Scheduled Meds: . antiseptic oral  rinse  7 mL Mouth Rinse BID  . Chlorhexidine Gluconate Cloth  6 each Topical Q0600  . ferrous sulfate  325 mg Oral Daily  . mupirocin ointment  1 application Nasal BID  . piperacillin-tazobactam (ZOSYN)  IV  3.375 g Intravenous Q8H  . prednisoLONE acetate  1 drop Right Eye BID  . sodium chloride  3 mL Intravenous Q12H  . timolol  1 drop Right Eye BID  . vancomycin  1,750 mg Intravenous Q24H  . Warfarin - Pharmacist Dosing Inpatient   Does not apply q1800   Continuous Infusions:

## 2015-02-08 NOTE — Progress Notes (Signed)
Patient Name: [name] Date of Encounter: 02/08/2015  Principal Problem:   Cellulitis Active Problems:   Essential hypertension   Acute respiratory failure with hypoxia   Hematoma   Atrial fibrillation   Elevated liver enzymes   Elevated troponin   NSTEMI (non-ST elevated myocardial infarction)   Sepsis   Gram-positive cocci bacteremia   Anemia of chronic disease   Alcoholic hepatitis   Thrombocytopenia   Diastolic dysfunction with acute on chronic heart failure   Length of Stay: 2  SUBJECTIVE  The patient denies CP or SOB, no dizziness.   CURRENT MEDS . antiseptic oral rinse  7 mL Mouth Rinse BID  . Chlorhexidine Gluconate Cloth  6 each Topical Q0600  . ferrous sulfate  325 mg Oral Daily  . mupirocin ointment  1 application Nasal BID  . piperacillin-tazobactam (ZOSYN)  IV  3.375 g Intravenous Q8H  . prednisoLONE acetate  1 drop Right Eye BID  . sodium chloride  3 mL Intravenous Q12H  . timolol  1 drop Right Eye BID  . vancomycin  1,750 mg Intravenous Q24H  . Warfarin - Pharmacist Dosing Inpatient   Does not apply q1800   OBJECTIVE  Filed Vitals:   02/08/15 0400 02/08/15 0402 02/08/15 0500 02/08/15 0600  BP: 109/53   87/53  Pulse: 65   66  Temp:  97.7 F (36.5 C)    TempSrc:  Oral    Resp: 15   17  Height:      Weight:   326 lb 8 oz (148.1 kg)   SpO2: 99%   92%    Intake/Output Summary (Last 24 hours) at 02/08/15 0747 Last data filed at 02/08/15 0600  Gross per 24 hour  Intake   1450 ml  Output    850 ml  Net    600 ml   Filed Weights   02/07/15 1115 02/07/15 2358 02/08/15 0500  Weight: 323 lb 13.7 oz (146.9 kg) 326 lb 8 oz (148.1 kg) 326 lb 8 oz (148.1 kg)    PHYSICAL EXAM  General: Pleasant, NAD. Neuro: Alert and oriented X 3. Moves all extremities spontaneously. Psych: Normal affect. HEENT:  Normal  Neck: Supple without bruits or JVD. Lungs:  Resp regular and unlabored, CTA. Heart: RRR no s3, s4, or murmurs. Abdomen: Soft,  non-tender, non-distended, BS + x 4.  Extremities: No clubbing, cyanosis or edema. DP/PT/Radials 2+ and equal bilaterally.  Accessory Clinical Findings  CBC  Recent Labs  02/05/15 1822  02/07/15 0508 02/08/15 0530  WBC 7.4  < > 6.8 5.7  NEUTROABS 5.9  --   --   --   HGB 12.6*  < > 10.9* 11.1*  HCT 39.4  < > 34.1* 35.1*  MCV 107.4*  < > 108.6* 108.7*  PLT 140*  < > 129* 143*  < > = values in this interval not displayed. Basic Metabolic Panel  Recent Labs  02/06/15 1400 02/07/15 0508 02/08/15 0530  NA 143 141 142  K 5.4* 5.1 4.7  CL 109 110 111  CO2 24 24 25   GLUCOSE 97 87 89  BUN 39* 47* 45*  CREATININE 1.78* 2.10* 1.76*  CALCIUM 8.5 7.9* 8.1*  MG 2.0  --   --   PHOS 5.5*  --   --    Liver Function Tests  Recent Labs  02/07/15 0508 02/08/15 0530  AST 218* 163*  ALT 147* 137*  ALKPHOS 106 116  BILITOT 2.7* 2.7*  PROT 5.9* 6.0  ALBUMIN 3.0* 2.8*   Cardiac Enzymes  Recent Labs  02/07/15 1756 02/07/15 2320 02/08/15 0530  TROPONINI 2.31* 1.88* 1.54*    Recent Labs  02/06/15 1400  HGBA1C 5.9*   Fasting Lipid Panel  Recent Labs  02/07/15 0508  CHOL 79  HDL 31*  LDLCALC 37  TRIG 54  CHOLHDL 2.5   Thyroid Function Tests  Recent Labs  02/06/15 1400  TSH 1.163    Dg Chest Port 1 View  02/06/2015   CLINICAL DATA:  Pain following fall  EXAM: PORTABLE CHEST - 1 VIEW  COMPARISON:  April 26, 2013  FINDINGS: There is no edema or consolidation. Heart is enlarged with pulmonary vascularity within normal limits. No adenopathy. No pneumothorax. No bone lesions.  IMPRESSION: Cardiomegaly.  No edema or consolidation.   Electronically Signed   By: Bretta Bang III M.D.   On: 02/06/2015 09:17    TELE: a-fib with slow ventricular response  ECG: a-fib, NIVCD, old inferior MI, unchanged from 2014   Echo: 02/07/2015 - Left ventricle: The cavity size was normal. Systolic function was normal. The estimated ejection fraction was in the range of  55% to 60%. Wall motion was normal; there were no regional wall motion abnormalities. There was a reduced contribution of atrial contraction to ventricular filling, due to increased ventricular diastolic pressure or atrial contractile dysfunction. Doppler parameters are consistent with a reversible restrictive pattern, indicative of decreased left ventricular diastolic compliance and/or increased left atrial pressure (grade 3 diastolic dysfunction). - Aortic valve: Trileaflet; mildly thickened, mildly calcified leaflets. - Mitral valve: Calcified annulus. Mild, late systolicprolapse, involving the anterior leaflet. There was mild to moderate regurgitation directed eccentrically. - Right ventricle: The cavity size was moderately dilated. Wall thickness was normal. Systolic function was severely reduced. - Pulmonary arteries: PA peak pressure: 40 mm Hg (S).  Impressions: - The right ventricular systolic pressure was increased consistent with moderate pulmonary hypertension.    ASSESSMENT AND PLAN  Principal Problem:  NSTEMI (non-ST elevated myocardial infarction) Active Problems:  Essential hypertension  Anemia  Hematoma  Atrial fibrillation  Elevated liver enzymes  Respiratory distress  Cardiomegaly  Elevated troponin  69 y.o. 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 Fib on coumadin therapy, multiple fall, obesity, hypertension, hx of V. Tach, anemia, hypoxia, GERD, who was consulted for possible cardiac etiology of multiple fall and multiple lab abnormalities.  The patient has elevated troponin - demand ischemia, however completely asymptomatic and unchanged ECG from 2014, echocardiogram showed normal LVEF 55-60% with no regional wall motion abnormalities.  He has grade 3 diastolic dysfunction with elevated filling pressures, he has LE edema, clear lungs. Hold diuretics in the settings  of acute kidney failure.Marland Kitchen His a-fib is chronic and rate controlled.  Bradycardia down to 30' at night on telemetry, but asymptomatic. Carvedilol was discontinued.  Continue statin, hold lasix. Coumadin per pharmacy. Hold Benicar in a setting of AKI.  Continue therapy for cellulitis. We won't be planning a cath in the settings of acute infection and acute on chronic kidney failure, we will follow and perform a stress test once his acute issues are resolved.   We will follow.  Signed, Lars Masson MD, [facility] 02/08/2015

## 2015-02-08 NOTE — Progress Notes (Signed)
Nutrition Brief Note  Patient identified for Low Braden Score.   Wt Readings from Last 15 Encounters:  02/08/15 326 lb 8 oz (148.1 kg)  02/05/15 320 lb (145.151 kg)  09/20/13 263 lb (119.296 kg)  06/28/13 266 lb (120.657 kg)  04/25/13 273 lb 5.9 oz (124 kg)  04/18/13 285 lb 9.6 oz (129.547 kg)  04/14/13 278 lb 10.6 oz (126.4 kg)  06/28/12 299 lb 12.8 oz (135.988 kg)  04/24/11 308 lb 6.4 oz (139.889 kg)  06/11/10 318 lb (144.244 kg)  12/09/09 373 lb (169.192 kg)    Body mass index is 44.27 kg/(m^2). Patient meets criteria for morbid obesity based on current BMI.   Current diet order is regular, patient is consuming approximately 100% of meals at this time. Labs and medications reviewed.   No nutrition interventions warranted at this time. If nutrition issues arise, please consult RD.   Emmaline Kluver MS, RD, LDN [phone]

## 2015-02-09 DIAGNOSIS — I5189 Other ill-defined heart diseases: Secondary | ICD-10-CM

## 2015-02-09 DIAGNOSIS — I517 Cardiomegaly: Secondary | ICD-10-CM

## 2015-02-09 DIAGNOSIS — I9589 Other hypotension: Secondary | ICD-10-CM

## 2015-02-09 DIAGNOSIS — I482 Chronic atrial fibrillation: Secondary | ICD-10-CM

## 2015-02-09 DIAGNOSIS — J9601 Acute respiratory failure with hypoxia: Secondary | ICD-10-CM

## 2015-02-09 LAB — CULTURE, BLOOD (ROUTINE X 2)

## 2015-02-09 LAB — PROTIME-INR
INR: 2.94 — AB (ref 0.00–1.49)
Prothrombin Time: 30.9 seconds — ABNORMAL HIGH (ref 11.6–15.2)

## 2015-02-09 MED ORDER — LORAZEPAM 1 MG PO TABS
1.0000 mg | ORAL_TABLET | Freq: Once | ORAL | Status: AC
  Administered 2015-02-09: 1 mg via ORAL
  Filled 2015-02-09: qty 1

## 2015-02-09 MED ORDER — SODIUM CHLORIDE 0.9 % IV SOLN
Freq: Once | INTRAVENOUS | Status: AC
  Administered 2015-02-09: 10:00:00 via INTRAVENOUS

## 2015-02-09 NOTE — Progress Notes (Signed)
Pt.remains A/Ox4 and is resting in bed. Pt.HR decreased to 38 non-sustained. His HR is sustaining in the upper 40's to mid 50's. Attending MD paged and notified.

## 2015-02-09 NOTE — Progress Notes (Signed)
Pt.has swollen scrotum. Foley insertion attempted x 1 and was unsuccessful. Paged attending MD to notify.

## 2015-02-09 NOTE — Progress Notes (Signed)
ANTICOAGULATION CONSULT NOTE - Follow Up Consult  Pharmacy Consult for Coumadin Indication: atrial fibrillation  No Known Allergies  Patient Measurements: Height: 6' (182.9 cm) Weight: (!) 329 lb 5.9 oz (149.4 kg) IBW/kg (Calculated) : 77.6  Vital Signs: Temp: 98.4 F (36.9 C) (04/30 0400) Temp Source: Oral (04/30 0400) BP: 84/44 mmHg (04/30 0600) Pulse Rate: 58 (04/30 0600)  Labs:  Recent Labs  02/06/15 1400  02/07/15 0508 02/07/15 1756 02/07/15 2320 02/08/15 0530 02/09/15 0350  HGB  --   --  10.9*  --   --  11.1*  --   HCT  --   --  34.1*  --   --  35.1*  --   PLT  --   --  129*  --   --  143*  --   LABPROT  --   --  33.1*  --   --  34.7* 30.9*  INR  --   --  3.21*  --   --  3.42* 2.94*  CREATININE 1.78*  --  2.10*  --   --  1.76*  --   TROPONINI 2.48*  < >  --  2.31* 1.88* 1.54*  --   < > = values in this interval not displayed.  Estimated Creatinine Clearance: 61.2 mL/min (by C-G formula based on Cr of 1.76).   Medications:  Scheduled:  . sodium chloride   Intravenous Once  . antiseptic oral rinse  7 mL Mouth Rinse BID  . Chlorhexidine Gluconate Cloth  6 each Topical Q0600  . ferrous sulfate  325 mg Oral Daily  . mupirocin ointment  1 application Nasal BID  . piperacillin-tazobactam (ZOSYN)  IV  3.375 g Intravenous Q8H  . prednisoLONE acetate  1 drop Right Eye BID  . sodium chloride  3 mL Intravenous Q12H  . timolol  1 drop Right Eye BID  . vancomycin  1,750 mg Intravenous Q24H  . Warfarin - Pharmacist Dosing Inpatient   Does not apply q1800    Assessment: Patient is a 69 y.o M with hx CM, EtOH abuse, chronic LE swelling, and on coumadin PTA for afib. He presented to the ED today s/p fall with c/o pain and swelling of LE. Coumadin per pharmacy. Home dose reported as 2.5mg  daily except 5mg  on MWF.   INR 4/28 supratherapeutic after 5mg  dose 4/27  INR decreased today to 2.94 (high end of therapeutic range)  CBC unchanged, low,stable. No bleeding  reported/documented.  Reg diet ordered.  Broad-spectrum abx can increase sensitivity to Coumadin.  Goal of Therapy:  INR 2-3   Plan:  Continue to hold Coumadin today. F/u daily INR.  Otho Bellows PharmD Pager [phone] 02/09/2015, 9:32 AM

## 2015-02-09 NOTE — Progress Notes (Signed)
Pt.is A/Ox4 and is resting in bed. He had c/o bilateral lower leg pain. Pt.repositioned and prn pain medication was given. Urine output was 100 ml so bladder scan was done and showed 462 ml. Pt.heart rate also had decreased to 38 non-sustained. Pt.is asymptomatic. Attending MD, Dr.Devine was paged and notified.  New order written.

## 2015-02-09 NOTE — Progress Notes (Signed)
Pt.is A/Ox4 and is resting in bed. Pt.HR decreased to 40's non-sustained this am. He is asymptomatic. Attending MD paged and notified.

## 2015-02-09 NOTE — Progress Notes (Signed)
SUBJECTIVE: Denies chest pain and shortness of breath. Wants to get out of bed today.      Intake/Output Summary (Last 24 hours) at 02/09/15 0845 Last data filed at 02/09/15 0600  Gross per 24 hour  Intake    698 ml  Output    975 ml  Net   -277 ml    Current Facility-Administered Medications  Medication Dose Route Frequency Provider Last Rate Last Dose  . 0.9 %  sodium chloride infusion  250 mL Intravenous PRN Dorothea Ogle, MD 10 mL/hr at 02/07/15 1900 250 mL at 02/07/15 1900  . antiseptic oral rinse (CPC / CETYLPYRIDINIUM CHLORIDE 0.05%) solution 7 mL  7 mL Mouth Rinse BID Alison Murray, MD   7 mL at 02/08/15 2200  . Chlorhexidine Gluconate Cloth 2 % PADS 6 each  6 each Topical Q0600 Alison Murray, MD   6 each at 02/08/15 [phone]  . ferrous sulfate tablet 325 mg  325 mg Oral Daily Dorothea Ogle, MD   325 mg at 02/08/15 0912  . morphine 2 MG/ML injection 1 mg  1 mg Intravenous Q2H PRN Dorothea Ogle, MD   1 mg at 02/08/15 1156  . mupirocin ointment (BACTROBAN) 2 % 1 application  1 application Nasal BID Alison Murray, MD   1 application at 02/08/15 2139  . ondansetron (ZOFRAN) tablet 4 mg  4 mg Oral Q6H PRN Dorothea Ogle, MD       Or  . ondansetron [facility]) injection 4 mg  4 mg Intravenous Q6H PRN Dorothea Ogle, MD      . oxyCODONE-acetaminophen (PERCOCET/ROXICET) 5-325 MG per tablet 1 tablet  1 tablet Oral Q4H PRN Alison Murray, MD   1 tablet at 02/08/15 2251  . piperacillin-tazobactam (ZOSYN) IVPB 3.375 g  3.375 g Intravenous Q8H Anh P Pham, RPH 12.5 mL/hr at 02/09/15 0538 3.375 g at 02/09/15 0538  . prednisoLONE acetate (PRED FORTE) 1 % ophthalmic suspension 1 drop  1 drop Right Eye BID Dorothea Ogle, MD   1 drop at 02/08/15 2137  . sodium chloride 0.9 % injection 3 mL  3 mL Intravenous Q12H Dorothea Ogle, MD   3 mL at 02/08/15 2139  . sodium chloride 0.9 % injection 3 mL  3 mL Intravenous PRN Dorothea Ogle, MD      . timolol (TIMOPTIC) 0.5 % ophthalmic solution 1 drop  1  drop Right Eye BID Dorothea Ogle, MD   1 drop at 02/08/15 2138  . vancomycin (VANCOCIN) 1,750 mg in sodium chloride 0.9 % 500 mL IVPB  1,750 mg Intravenous Q24H Anh P Pham, RPH   1,750 mg at 02/08/15 1617  . Warfarin - Pharmacist Dosing Inpatient   Does not apply q1800 Lucia Gaskins, RPH   Stopped at 02/07/15 1800    Filed Vitals:   02/09/15 0000 02/09/15 0400 02/09/15 0500 02/09/15 0600  BP: 92/56 80/51  84/44  Pulse: 52 49  58  Temp:  98.4 F (36.9 C)    TempSrc:  Oral    Resp: 21 19  18   Height:      Weight:   329 lb 5.9 oz (149.4 kg)   SpO2: 95% 96%  95%    PHYSICAL EXAM General: NAD, obese HEENT: Normal. Neck: Difficult to assess JVP due to body habitus Lungs: Clear to auscultation bilaterally with normal respiratory effort. CV: Bradycardic, irregular rhythm, normal S1/S2, no S3, no murmur.  Marked 2+ pitting pretibial edema with marked chronic stasis dermatitis b/l.  Abdomen: Soft, nontender, obese. Neurologic: Alert and oriented.  Psych: Normal affect. Extremities: No clubbing or cyanosis.   TELEMETRY: Reviewed telemetry pt in atrial fibrillation with a slow ventricular response.  LABS: Basic Metabolic Panel:  Recent Labs  16/10/96 1400 02/07/15 0508 02/08/15 0530  NA 143 141 142  K 5.4* 5.1 4.7  CL 109 110 111  CO2 GLUCOSE 97 87 89  BUN 39* 47* 45*  CREATININE 1.78* 2.10* 1.76*  CALCIUM 8.5 7.9* 8.1*  MG 2.0  --   --   PHOS 5.5*  --   --    Liver Function Tests:  Recent Labs  02/07/15 0508 02/08/15 0530  AST 218* 163*  ALT 147* 137*  ALKPHOS 106 116  BILITOT 2.7* 2.7*  PROT 5.9* 6.0  ALBUMIN 3.0* 2.8*   No results for input(s): LIPASE, AMYLASE in the last 72 hours. CBC:  Recent Labs  02/07/15 0508 02/08/15 0530  WBC 6.8 5.7  HGB 10.9* 11.1*  HCT 34.1* 35.1*  MCV 108.6* 108.7*  PLT 129* 143*   Cardiac Enzymes:  Recent Labs  02/07/15 1756 02/07/15 2320 02/08/15 0530  TROPONINI 2.31* 1.88* 1.54*   BNP: Invalid input(s):  POCBNP D-Dimer: No results for input(s): DDIMER in the last 72 hours. Hemoglobin A1C:  Recent Labs  02/06/15 1400  HGBA1C 5.9*   Fasting Lipid Panel:  Recent Labs  02/07/15 0508  CHOL 79  HDL 31*  LDLCALC 37  TRIG 54  CHOLHDL 2.5   Thyroid Function Tests:  Recent Labs  02/06/15 1400  TSH 1.163   Anemia Panel:  Recent Labs  02/07/15 0508  VITAMINB12 789  FOLATE 6.8  FERRITIN 564*  TIBC 192*  IRON 22*  RETICCTPCT 1.9    RADIOLOGY: Dg Tibia/fibula Left  02/05/2015   CLINICAL DATA:  Left leg pain and swelling  EXAM: LEFT TIBIA AND FIBULA - 2 VIEW  COMPARISON:  None.  FINDINGS: There is no evidence of fracture or other focal bone lesions. Soft tissues are unremarkable.  IMPRESSION: No acute osseous injury of the left tibia or fibula.   Electronically Signed   By: Elige Ko   On: 02/05/2015 18:54   US Abdomen Complete  02/06/2015   CLINICAL DATA:  Elevated liver function tests.  EXAM: ULTRASOUND ABDOMEN COMPLETE  COMPARISON:  None.  FINDINGS: Gallbladder: A few small, mobile gallstones are identified. There is no gallbladder wall thickening or pericholecystic fluid. Sonographer reports negative Murphy's sign.  Common bile duct: Diameter: 0.4 cm  Liver: No focal lesion identified. Within normal limits in parenchymal echogenicity.  IVC: No abnormality visualized.  Pancreas: Visualized portion unremarkable.  Spleen: Size and appearance within normal limits.  Right Kidney: Length: 10.9 cm. Echogenicity within normal limits. No mass or hydronephrosis visualized.  Left Kidney: Length: 12.3 cm. Echogenicity within normal limits. No mass or hydronephrosis visualized.  Abdominal aorta: No aneurysm visualized.  Other findings: None.  IMPRESSION: A few small gallstones are identified but there is no evidence of acute cholecystitis. The examination is otherwise negative.   Electronically Signed   By: Drusilla Kanner M.D.   On: 02/06/2015 11:45   Dg Chest Port 1 View  02/06/2015    CLINICAL DATA:  Pain following fall  EXAM: PORTABLE CHEST - 1 VIEW  COMPARISON:  April 26, 2013  FINDINGS: There is no edema or consolidation. Heart is enlarged with pulmonary vascularity within normal limits. No adenopathy.  No pneumothorax. No bone lesions.  IMPRESSION: Cardiomegaly.  No edema or consolidation.   Electronically Signed   By: Bretta Bang III M.D.   On: 02/06/2015 09:17      ASSESSMENT AND PLAN: 1. NSTEMI: Likely related to demand ischemia. Asymptomatic. Troponins down to 1.54 on 4/29. Normal LV systolic function and regional wall motion with grade III diastolic dysfunction. Unable to add beta blockers and ACEI's due to hypotension and bradycardia. Not on statins due to elevated transaminases. Currently on warfarin for atrial fibrillation. No plans for invasive strategy. Will consider nuclear stress test, perhaps as outpatient.  2. Chronic diastolic heart failure and chronic right heart failure, with severely reduced RV systolic function: No plans for diuretics at present due to CKD and hypotension. SCr 1.76 on 4/29.  3. Atrial fibrillation: Slow ventricular response, not on AV nodal blocking agents. INR therapeutic at 2.94 today. On warfarin. No longer on Coreg.  4. Sepsis/lower extremity cellulitis: On vancomycin and Zosyn.   Prentice Docker, M.D., F.A.C.C.

## 2015-02-09 NOTE — Progress Notes (Addendum)
Patient ID: [name], male   DOB: 1945-12-31, 69 y.o.   MRN: [id number] [facility] PROGRESS NOTE  [name] ATF:[id number] DOB: 1946/01/02 DOA: 02/06/2015 PCP: Florina Ou, MD  Brief narrative:    69 y.o. male with past medical history of CHF secondary to nonischemic cardiomyopathy, last 2 D ECHO in 04/2013 with EF 50-55%, alcoholism, chronic a. Fib on coumadin therapy, multiple falls, obesity, hypertension, hx of V. Tach who presented to [facility] ED after an episode of fall from the bed few hours PTA. Per pt this was mechanical fall and no prodromal symptoms noted prior to fall. EMS arrived to his home and per their report he was covered in urine and trash. Pt lives with his father who is 23.  On admission, patient was hypotensive, low-grade fever, tachycardic, hypoxic. He had a brief moment of confusion. His blood work was notable for troponin of 0.83, BNP 1100, INR 2.51, Hgb 11.8, K 5.6, Cr 1.6, BUN 32, AST 470, ALT 212, ALK 149, and lactic acid as high as 2.1. The 12 lead EKG showed a fib with rate of 79.   Barrier to discharge: Patient again hypotensive this morning. We will give normal saline bolus 500 mL and see if blood pressure improves.  Assessment/Plan:    Principal Problem: Sepsis with multiple organ dysfunction syndrome / Sepsis due to lower extremity cellulitis / Gram positive cocci bacteremia  - Sepsis criteria met on admission with tachycardia, tachypnea, hypoxia, hypotension. On admission lactic acid was 2.1 and procalcitonin 1.0. Cellulitis presumed to be source of infection. Patient did have small leukocytes on urinalysis however urine culture did not produce significant bacterial growth. - One of the blood cultures is growing gram-positive cocci in pairs and chains, final report is pending. - Continue vancomycin and Zosyn which was started on the admission. - Continue to monitor in step down unit because of hypotension. Patient will receive normal saline bolus 500  mL to see if blood pressure improves.   Active Problems: NSTEMI (non-ST elevated myocardial infarction) - Likely demand ischemia from acute resp failure with hypoxia and sepsis - Cardiology has seen the patient in consultation. The troponin level has plateaued down to 1.54. Cardiac cath is not planned at this time because of acute infection and chronic kidney failure. Plan for stress test once acute issues resolved. - 2-D echo on this admission showed normal ejection fraction, grade 3 diastolic dysfunction. - On anticoagulation with Coumadin.  Acute hypoxic respiratory failure - Likely from demand ischemia in the setting of sepsis. - Oxygen saturation stable with nasal cannula oxygen support.  Acute on chronic diastolic CHF - 2 D ECHO on this admission showed grade 3 diastolic dysfunction with preserved EF - BNP on the admission 1119. Unable to give Lasix because of hypotension. - Cardiology following.  Hyperkalemia - Oral potassium supplementation taken at home which could have contributed to hyperkalemia - Potassium normalized.   Acute renal failure - Possibly from sepsis or benicar, lasix. All those medications were placed on hold at the time of the admission. - Creatinine is 1.76. BMP this morning is pending.  Atrial fibrillation with RVR - CHADS vasc score at least 4 - Rate at home controlled with Coreg but this is on hold because of hypotension. - Continue anticoagulation with Coumadin.  Elevated liver enzymes - Secondary to alcohol induced hepatitis - LFT's better since admission. AST 470 --> 218; ALT 212 --> 147; TB 5.2 --> 2.7; ALP 149 --> WNL - Abd [place] did  not show acute cholecystitis.  Alcohol abuse - Continue CIWA - No reports of withdrawals.   Anemia of chronic disease, macrocytic - Likely secondary to bone marrow suppression from history of alcohol abuse. - Hemoglobin is stable  Thrombocytopenia - Likely secondary to bone marrow suppression from history of  alcohol abuse. Platelet count is 143. - No reports of bleeding.  Dyslipidemia  - Once LFTs improve we will resume statin therapy  Morbid obesity - Body mass index is 44.27 kg/(m^2). - Counseled on diet    DVT Prophylaxis  - On anticoagulation with coumadin   Code Status: Full.  Family Communication: plan of care discussed with the patient Disposition Plan: Remains in step down because of hypotension. Not stable for transfer to telemetry floor yet.   IV access:  Peripheral IV  Procedures and diagnostic studies:    Dg Tibia/fibula Left Feb 15, 2015 No acute osseous injury of the left tibia or fibula.   US Abdomen Complete 02/06/2015 A few small gallstones are identified but there is no evidence of acute cholecystitis. The examination is otherwise negative.   Dg Chest Port 1 View 02/06/2015 Cardiomegaly. No edema or consolidation.   Medical Consultants:  Cardiology Dr. Ena Dawley   Other Consultants:  Physical therapy   IAnti-Infectives:   Zosyn 02/06/2015 --> Vanco 02/06/2015 -->   Leisa Lenz, MD  [facility] Pager [phone]  Time spent in minutes: 25 minutes  If 7PM-7AM, please contact night-coverage [email or web address] Password [email or web address] 02/09/2015, 8:59 AM   LOS: 3 days    HPI/Subjective: No acute overnight events. Patient reports slight headache. No nausea or vomiting.  Objective: Filed Vitals:   02/09/15 0000 02/09/15 0400 02/09/15 0500 02/09/15 0600  BP: 92/56 80/51  84/44  Pulse: 52 49  58  Temp:  98.4 F (36.9 C)    TempSrc:  Oral    Resp: $Remo'21 19  18  'KBfQT$ Height:      Weight:   149.4 kg (329 lb 5.9 oz)   SpO2: 95% 96%  95%    Intake/Output Summary (Last 24 hours) at 02/09/15 0859 Last data filed at 02/09/15 0600  Gross per 24 hour  Intake    698 ml  Output    975 ml  Net   -277 ml    Exam:   General:  Pt is alert, follows commands appropriately, not in acute distress  Cardiovascular: irregular rhythm, S1/S2  appreciated  Respiratory: Clear to auscultation bilaterally, no wheezing, no crackles, no rhonchi  Abdomen: Soft, non tender, non distended, bowel sounds present  Extremities: Lower extremity +2 pitting edema with superimpose chronic skin changes, pulses DP and PT palpable bilaterally  Neuro: Grossly nonfocal  Data Reviewed: Basic Metabolic Panel:  Recent Labs Lab 15-Feb-2015 1822 02/06/15 0838 02/06/15 1400 02/07/15 0508 02/08/15 0530  NA 142 140 143 141 142  K 4.7 5.6* 5.4* 5.1 4.7  CL 109 109 109 110 111  CO2 $Re'25 21 24 24 25  'aea$ GLUCOSE 89 94 97 87 89  BUN 22 32* 39* 47* 45*  CREATININE 0.90 1.65* 1.78* 2.10* 1.76*  CALCIUM 8.9 8.4 8.5 7.9* 8.1*  MG  --   --  2.0  --   --   PHOS  --   --  5.5*  --   --    Liver Function Tests:  Recent Labs Lab 02/15/2015 1822 02/06/15 0838 02/06/15 1400 02/07/15 0508 02/08/15 0530  AST 27 470* 490* 218* 163*  ALT 16 212* 209* 147* 137*  ALKPHOS 150* 149* 147*  106 116  BILITOT 4.1* 5.2* 4.4* 2.7* 2.7*  PROT 7.6 7.2 7.2 5.9* 6.0  ALBUMIN 3.7 3.6 3.6 3.0* 2.8*   No results for input(s): LIPASE, AMYLASE in the last 168 hours. No results for input(s): AMMONIA in the last 168 hours. CBC:  Recent Labs Lab 02/05/15 1822 02/06/15 0838 02/07/15 0508 02/08/15 0530  WBC 7.4 8.8 6.8 5.7  NEUTROABS 5.9  --   --   --   HGB 12.6* 11.8* 10.9* 11.1*  HCT 39.4 37.3* 34.1* 35.1*  MCV 107.4* 109.1* 108.6* 108.7*  PLT 140* 145* 129* 143*   Cardiac Enzymes:  Recent Labs Lab 02/06/15 1502 02/06/15 1933 02/07/15 1756 02/07/15 2320 02/08/15 0530  TROPONINI 2.36* 2.84* 2.31* 1.88* 1.54*   BNP: Invalid input(s): POCBNP CBG: No results for input(s): GLUCAP in the last 168 hours.  Urine culture     Status: None   Collection Time: 02/06/15 12:27 PM  Result Value Ref Range Status   Specimen Description URINE, CLEAN CATCH  Final   Special Requests NONE  Final   Colony Count   Final    >=100,000 COLONIES/ML Performed at [facility]    Culture   Final    Multiple bacterial morphotypes present, none predominant.  Performed at [facility]    Report Status 02/07/2015 FINAL  Final  Culture, blood (x 2)     Status: None (Preliminary result)   Collection Time: 02/06/15  3:01 PM  Result Value Ref Range Status   Specimen Description BLOOD RIGHT HAND  Final   Special Requests BOTTLES DRAWN AEROBIC AND ANAEROBIC 5CC  Final   Culture   Final    GRAM POSITIVE COCCI IN PAIRS AND CHAINS    Report Status PENDING  Incomplete  Culture, blood (x 2)     Status: None (Preliminary result)   Collection Time: 02/06/15  3:16 PM  Result Value Ref Range Status   Specimen Description BLOOD LEFT ANTECUBITAL  Final   Special Requests BOTTLES DRAWN AEROBIC AND ANAEROBIC 5CC  Final   Culture   Final           BLOOD CULTURE RECEIVED NO GROWTH TO DATE     Report Status PENDING  Incomplete  MRSA PCR Screening     Status: Abnormal   Collection Time: 02/07/15 11:15 AM  Result Value Ref Range Status   MRSA by PCR POSITIVE (A) NEGATIVE Final     Scheduled Meds: . ferrous sulfate  325 mg Oral Daily  . piperacillin-tazobactam (ZOSYN)  IV  3.375 g Intravenous Q8H  . vancomycin  1,750 mg Intravenous Q24H  . Warfarin -   Does not apply q1800

## 2015-02-10 DIAGNOSIS — A408 Other streptococcal sepsis: Secondary | ICD-10-CM

## 2015-02-10 DIAGNOSIS — R7881 Bacteremia: Secondary | ICD-10-CM

## 2015-02-10 DIAGNOSIS — I4891 Unspecified atrial fibrillation: Secondary | ICD-10-CM

## 2015-02-10 DIAGNOSIS — R001 Bradycardia, unspecified: Secondary | ICD-10-CM

## 2015-02-10 LAB — BASIC METABOLIC PANEL
Anion gap: 6 (ref 5–15)
BUN: 40 mg/dL — ABNORMAL HIGH (ref 6–20)
CO2: 24 mmol/L (ref 22–32)
CREATININE: 1.32 mg/dL — AB (ref 0.61–1.24)
Calcium: 8.3 mg/dL — ABNORMAL LOW (ref 8.9–10.3)
Chloride: 109 mmol/L (ref 101–111)
GFR calc Af Amer: >60 mL/min (ref >60)
GFR calc non Af Amer: 54 mL/min — ABNORMAL LOW (ref >60)
Glucose, Bld: 99 mg/dL (ref 70–99)
Potassium: 4.7 mmol/L (ref 3.5–5.1)
Sodium: 139 mmol/L (ref 135–145)

## 2015-02-10 LAB — PROTIME-INR
INR: 2.37 — ABNORMAL HIGH (ref 0.00–1.49)
Prothrombin Time: 26.1 seconds — ABNORMAL HIGH (ref 11.6–15.2)

## 2015-02-10 LAB — CBC
HCT: 35.7 % — ABNORMAL LOW (ref 39.0–52.0)
Hemoglobin: 11.4 g/dL — ABNORMAL LOW (ref 13.0–17.0)
MCH: 34.1 pg — ABNORMAL HIGH (ref 26.0–34.0)
MCHC: 31.9 g/dL (ref 30.0–36.0)
MCV: 106.9 fL — AB (ref 78.0–100.0)
Platelets: 155 10*3/uL (ref 150–400)
RBC: 3.34 MIL/uL — ABNORMAL LOW (ref 4.22–5.81)
RDW: 14.9 % (ref 11.5–15.5)
WBC: 5.4 10*3/uL (ref 4.0–10.5)

## 2015-02-10 MED ORDER — SODIUM CHLORIDE 0.9 % IV SOLN: INTRAVENOUS | Status: DC

## 2015-02-10 MED ORDER — WARFARIN SODIUM 5 MG PO TABS
5.0000 mg | ORAL_TABLET | Freq: Once | ORAL | Status: AC
  Administered 2015-02-10: 5 mg via ORAL
  Filled 2015-02-10: qty 1

## 2015-02-10 MED ORDER — LORAZEPAM 1 MG PO TABS
1.0000 mg | ORAL_TABLET | Freq: Every evening | ORAL | Status: DC | PRN
  Administered 2015-02-10 – 2015-02-12 (×3): 1 mg via ORAL
  Filled 2015-02-10 (×3): qty 1

## 2015-02-10 MED ORDER — CEFTRIAXONE SODIUM IN DEXTROSE 40 MG/ML IV SOLN
2.0000 g | INTRAVENOUS | Status: DC
  Administered 2015-02-10 – 2015-02-13 (×4): 2 g via INTRAVENOUS
  Filled 2015-02-10 (×4): qty 50

## 2015-02-10 NOTE — Progress Notes (Signed)
ANTICOAGULATION CONSULT NOTE - Follow Up Consult  Pharmacy Consult for Coumadin Indication: atrial fibrillation  No Known Allergies  Patient Measurements: Height: 6' (182.9 cm) Weight: (!) 329 lb 5.9 oz (149.4 kg) IBW/kg (Calculated) : 77.6  Vital Signs: Temp: 98.3 F (36.8 C) (05/01 0400) Temp Source: Oral (05/01 0400) BP: 114/56 mmHg (05/01 0647) Pulse Rate: 68 (05/01 0647)  Labs:  Recent Labs  02/07/15 1756 02/07/15 2320 02/08/15 0530 02/09/15 0350 02/10/15 0409  HGB  --   --  11.1*  --   --   HCT  --   --  35.1*  --   --   PLT  --   --  143*  --   --   LABPROT  --   --  34.7* 30.9* 26.1*  INR  --   --  3.42* 2.94* 2.37*  CREATININE  --   --  1.76*  --   --   TROPONINI 2.31* 1.88* 1.54*  --   --    Estimated Creatinine Clearance: 61.2 mL/min (by C-G formula based on Cr of 1.76).  Medications:  Scheduled:  . antiseptic oral rinse  7 mL Mouth Rinse BID  . Chlorhexidine Gluconate Cloth  6 each Topical Q0600  . ferrous sulfate  325 mg Oral Daily  . mupirocin ointment  1 application Nasal BID  . piperacillin-tazobactam (ZOSYN)  IV  3.375 g Intravenous Q8H  . prednisoLONE acetate  1 drop Right Eye BID  . sodium chloride  3 mL Intravenous Q12H  . timolol  1 drop Right Eye BID  . vancomycin  1,750 mg Intravenous Q24H  . Warfarin - Pharmacist Dosing Inpatient   Does not apply q1800   Assessment: Patient is a 69 y.o M with hx Cardiomyopathy, EtOH abuse, chronic LE swelling, and on coumadin PTA for afib. He presented to the ED 4/27 s/p fall with c/o pain and swelling of LE. Coumadin per pharmacy. Home dose reported as 2.5mg  daily except 5mg  on MWF.   INR 4/28 supratherapeutic after 5mg  dose 4/27, no Warfarin from 4/29  INR decreased today to 2.37, within therapeutic range  CBC unchanged, low,stable. No bleeding reported/documented.  Reg diet ordered, good po intake  Broad-spectrum abx can increase sensitivity to Coumadin.  Goal of Therapy:  INR 2-3   Plan:    Warfarin 5mg  today @ 1000  Daily PT/INR  CBC tomorrow  Otho Bellows PharmD Pager [phone] 02/10/2015, 7:52 AM

## 2015-02-10 NOTE — Progress Notes (Signed)
Patient is A/Ox4. He had c/o lower leg pain during the shift and prn pain medication was given. Pt.had order for transfer to telemetry unit however telemetry bed was not available. He is awaiting transfer.

## 2015-02-10 NOTE — Progress Notes (Signed)
SUBJECTIVE: Denies complaints of chest pain, shortness of breath, dizziness, and palpitations. Wants to get out of bed. Episodes of asymptomatic bradycardia.     Intake/Output Summary (Last 24 hours) at 02/10/15 0811 Last data filed at 02/09/15 2300  Gross per 24 hour  Intake   2052 ml  Output    150 ml  Net   1902 ml    Current Facility-Administered Medications  Medication Dose Route Frequency Provider Last Rate Last Dose  . 0.9 %  sodium chloride infusion  250 mL Intravenous PRN Dorothea Ogle, MD 10 mL/hr at 02/07/15 1900 250 mL at 02/07/15 1900  . antiseptic oral rinse (CPC / CETYLPYRIDINIUM CHLORIDE 0.05%) solution 7 mL  7 mL Mouth Rinse BID Alison Murray, MD   7 mL at 02/09/15 2123  . Chlorhexidine Gluconate Cloth 2 % PADS 6 each  6 each Topical Q0600 Alison Murray, MD   6 each at 02/09/15 0800  . ferrous sulfate tablet 325 mg  325 mg Oral Daily Dorothea Ogle, MD   325 mg at 02/09/15 1610  . morphine 2 MG/ML injection 1 mg  1 mg Intravenous Q2H PRN Dorothea Ogle, MD   1 mg at 02/09/15 1252  . mupirocin ointment (BACTROBAN) 2 % 1 application  1 application Nasal BID Alison Murray, MD   1 application at 02/09/15 2121  . ondansetron (ZOFRAN) tablet 4 mg  4 mg Oral Q6H PRN Dorothea Ogle, MD       Or  . ondansetron [facility]) injection 4 mg  4 mg Intravenous Q6H PRN Dorothea Ogle, MD      . oxyCODONE-acetaminophen (PERCOCET/ROXICET) 5-325 MG per tablet 1 tablet  1 tablet Oral Q4H PRN Alison Murray, MD   1 tablet at 02/09/15 364 200 2252  . piperacillin-tazobactam (ZOSYN) IVPB 3.375 g  3.375 g Intravenous Q8H Anh P Pham, RPH 12.5 mL/hr at 02/10/15 0400 3.375 g at 02/10/15 0400  . prednisoLONE acetate (PRED FORTE) 1 % ophthalmic suspension 1 drop  1 drop Right Eye BID Dorothea Ogle, MD   1 drop at 02/09/15 2122  . sodium chloride 0.9 % injection 3 mL  3 mL Intravenous Q12H Dorothea Ogle, MD   3 mL at 02/09/15 1253  . sodium chloride 0.9 % injection 3 mL  3 mL Intravenous PRN Dorothea Ogle, MD      . timolol (TIMOPTIC) 0.5 % ophthalmic solution 1 drop  1 drop Right Eye BID Dorothea Ogle, MD   1 drop at 02/09/15 2121  . vancomycin (VANCOCIN) 1,750 mg in sodium chloride 0.9 % 500 mL IVPB  1,750 mg Intravenous Q24H Anh P Pham, RPH   1,750 mg at 02/09/15 2057  . warfarin (COUMADIN) tablet 5 mg  5 mg Oral Once Otho Bellows, RPH      . Warfarin - Pharmacist Dosing Inpatient   Does not apply q1800 Lucia Gaskins, RPH   Stopped at 02/07/15 1800    Filed Vitals:   02/10/15 0100 02/10/15 0200 02/10/15 0400 02/10/15 0647  BP:  100/47 118/52 114/56  Pulse:  55 71 68  Temp: 98.9 F (37.2 C)  98.3 F (36.8 C)   TempSrc: Oral  Oral   Resp:  Height:      Weight:      SpO2:  92% 95% 96%    PHYSICAL EXAM General: NAD, obese HEENT: Normal. Neck: Difficult to assess JVP  due to body habitus Lungs: Clear to auscultation bilaterally with normal respiratory effort. CV: Bradycardic, irregular rhythm, normal S1/S2, no S3, no murmur. Marked 2+ pitting pretibial edema with marked chronic stasis dermatitis b/l. Pretibial region tender to palpation. Abdomen: Soft, nontender, obese. Neurologic: Alert and oriented.  Psych: Normal affect. Extremities: No clubbing or cyanosis.   TELEMETRY: Reviewed telemetry pt in atrial fibrillation with a slow ventricular response.  LABS: Basic Metabolic Panel:  Recent Labs  04/54/09 0530  NA 142  K 4.7  CL 111  CO2 25  GLUCOSE 89  BUN 45*  CREATININE 1.76*  CALCIUM 8.1*   Liver Function Tests:  Recent Labs  02/08/15 0530  AST 163*  ALT 137*  ALKPHOS 116  BILITOT 2.7*  PROT 6.0  ALBUMIN 2.8*   No results for input(s): LIPASE, AMYLASE in the last 72 hours. CBC:  Recent Labs  02/08/15 0530  WBC 5.7  HGB 11.1*  HCT 35.1*  MCV 108.7*  PLT 143*   Cardiac Enzymes:  Recent Labs  02/07/15 1756 02/07/15 2320 02/08/15 0530  TROPONINI 2.31* 1.88* 1.54*   BNP: Invalid input(s): POCBNP D-Dimer: No results for  input(s): DDIMER in the last 72 hours. Hemoglobin A1C: No results for input(s): HGBA1C in the last 72 hours. Fasting Lipid Panel: No results for input(s): CHOL, HDL, LDLCALC, TRIG, CHOLHDL, LDLDIRECT in the last 72 hours. Thyroid Function Tests: No results for input(s): TSH, T4TOTAL, T3FREE, THYROIDAB in the last 72 hours.  Invalid input(s): FREET3 Anemia Panel: No results for input(s): VITAMINB12, FOLATE, FERRITIN, TIBC, IRON, RETICCTPCT in the last 72 hours.  RADIOLOGY: Dg Tibia/fibula Left  02/05/2015   CLINICAL DATA:  Left leg pain and swelling  EXAM: LEFT TIBIA AND FIBULA - 2 VIEW  COMPARISON:  None.  FINDINGS: There is no evidence of fracture or other focal bone lesions. Soft tissues are unremarkable.  IMPRESSION: No acute osseous injury of the left tibia or fibula.   Electronically Signed   By: Elige Ko   On: 02/05/2015 18:54   US Abdomen Complete  02/06/2015   CLINICAL DATA:  Elevated liver function tests.  EXAM: ULTRASOUND ABDOMEN COMPLETE  COMPARISON:  None.  FINDINGS: Gallbladder: A few small, mobile gallstones are identified. There is no gallbladder wall thickening or pericholecystic fluid. Sonographer reports negative Murphy's sign.  Common bile duct: Diameter: 0.4 cm  Liver: No focal lesion identified. Within normal limits in parenchymal echogenicity.  IVC: No abnormality visualized.  Pancreas: Visualized portion unremarkable.  Spleen: Size and appearance within normal limits.  Right Kidney: Length: 10.9 cm. Echogenicity within normal limits. No mass or hydronephrosis visualized.  Left Kidney: Length: 12.3 cm. Echogenicity within normal limits. No mass or hydronephrosis visualized.  Abdominal aorta: No aneurysm visualized.  Other findings: None.  IMPRESSION: A few small gallstones are identified but there is no evidence of acute cholecystitis. The examination is otherwise negative.   Electronically Signed   By: Drusilla Kanner M.D.   On: 02/06/2015 11:45   Dg Chest Port 1  View  02/06/2015   CLINICAL DATA:  Pain following fall  EXAM: PORTABLE CHEST - 1 VIEW  COMPARISON:  April 26, 2013  FINDINGS: There is no edema or consolidation. Heart is enlarged with pulmonary vascularity within normal limits. No adenopathy. No pneumothorax. No bone lesions.  IMPRESSION: Cardiomegaly.  No edema or consolidation.   Electronically Signed   By: Bretta Bang III M.D.   On: 02/06/2015 09:17      ASSESSMENT AND PLAN: 1. NSTEMI:  Likely related to demand ischemia. Asymptomatic. Troponins down to 1.54 on 4/29. Normal LV systolic function and regional wall motion with grade III diastolic dysfunction. Unable to add beta blockers and ACEI's due to hypotension and bradycardia. Received IV fluid bolus. Not on statins due to elevated transaminases. Currently on warfarin for atrial fibrillation. No plans for invasive strategy. Will consider nuclear stress test, perhaps as outpatient.  2. Chronic diastolic heart failure and chronic right heart failure, with severely reduced RV systolic function: No plans for diuretics at present due to CKD and hypotension. SCr 1.76 on 4/29.  3. Atrial fibrillation: Slow ventricular response, not on AV nodal blocking agents. INR therapeutic at 2.37 today. On warfarin. No longer on Coreg.  4. Sepsis/lower extremity cellulitis: On vancomycin and Zosyn.   Prentice Docker, M.D., F.A.C.C.

## 2015-02-10 NOTE — Progress Notes (Addendum)
Patient ID: HAYS DUNNIGAN, male   DOB: December 05, 1945, 69 y.o.   MRN: [id number] [facility] PROGRESS NOTE  NIKOLA MARONE RAQ:[id number] DOB: 1946/05/08 DOA: 02/06/2015 PCP: Florina Ou, MD  Brief narrative:    69 y.o. male with past medical history of CHF secondary to nonischemic cardiomyopathy, last 2 D ECHO in 04/2013 with EF 50-55%, alcoholism, chronic a. Fib on coumadin therapy, multiple falls, obesity, hypertension, hx of V. Tach who presented to [facility] ED after an episode of fall from the bed few hours PTA. Per pt this was mechanical fall and no prodromal symptoms noted prior to the fall. EMS arrived to his home and per their report he was covered in urine and trash. Pt lives with his father who is 22.  On admission, patient was hypotensive, low-grade fever, tachycardic with hypoxia. He had a brief moment of confusion. His blood work was notable for troponin of 0.83, BNP 1100, INR 2.51, Hgb 11.8, K 5.6, Cr 1.6, BUN 32, AST 470, ALT 212, ALK 149, and lactic acid as high as 2.1. The 12 lead EKG showed a fib with rate of 79. Cardiology has seen the pt in consultation with recommendations to hold off on cardiac cath due to acute infection and chronic renal insufficiency. Stress test would be planned as part of outpatient work up.  Hospital course is complicated with one of the blood cultures on admission growing strep viridans and staph species coag neg. While coag neg staph likely is a contaminant, strep viridans may suggest true bacteremia. Pt was on vanco and zosyn since admission. He had TTE done with did not reveal vegetations or thrombus.   Barrier to discharge: Tranfer to telemetry today. Repeat blood cultures today to evaluate if strep viridans bacteremia cleared up. ID called for input on abx treatment choice. Recommendation per Dr. Baxter Flattery to to switch to Rocephin 2 gm IV daily and folllow up sens report.   Assessment/Plan:    Principal Problem: Sepsis with multiple organ dysfunction  syndrome / Bilateral lower extremity cellulitis / Strep viridans bacteremia - Sepsis criteria met on admission with tachycardia, tachypnea, hypoxia, hypotension. Lctic acid  On admission 2.1 and procalcitonin 1.0.  - Pt initially started on vanco and zosyn.  - Suspected source of infection cellulitis. Please note, admission blood cultures so far (1 bottle) growing staph species coag neg and strep viridans and another blood culture (2nd bottle) shows no growth. I spoke with Dr. Baxter Flattery who recommended changing abx to Rocephin 2 gm IV daily, follow up on sens reports and repeating blood cultures today. - Pt had TTE which did not reveal vegetations or thrombus. ID did not feel that TEE is required at this time.  - Pt is hemodynamically stable this morning. He had hypotension for last 3 days but did not require pressor support. - Place on telemetry floor today.   Active Problems: NSTEMI (non-ST elevated myocardial infarction) - Likely demand ischemia from acute resp failure with hypoxia and sepsis / bacteremia  - The troponin level trended down from 2.31 to 1.54.  - Cardiac cath is not planned at this time per cardio because of acute infection and chronic kidney failure. Stress test will be planned on outpatient basis. - Of note, 2-D echo on this admission showed normal ejection fraction, grade 3 diastolic dysfunction. - On anticoagulation with Coumadin.  Acute hypoxic respiratory failure - Likely from demand ischemia in the setting of sepsis. - Respiratory status is stable.  - Oxygen saturation stable with nasal cannula  oxygen support.  Acute on chronic diastolic CHF - 2 D ECHO on this admission showed grade 3 diastolic dysfunction with preserved EF - BNP on the admission 1119. Unable to give Lasix because of hypotension since admission. This morning BP in 110's range and cardiology following. - Weight in past 72 hours: 148.1 kg --> 148.1 kg --> 149 kg - Continue daily weight    Hyperkalemia - Oral potassium supplementation taken at home which could have contributed to hyperkalemia - Potassium normalized.  - Check BMP this am - lab pending.  Acute renal failure - Possibly from sepsis or benicar, lasix. All those medications were placed on hold at the time of the admission. - Creatinine is 1.76 on 02/08/15. BMP is pending this morning.   Atrial fibrillation with RVR - CHADS vasc score at least 4 - Rate at home controlled with Coreg but this was on hold because of hypotension. Also, has had transient asymptomatic bradycardia with HR in mid 30's so we will continue to hold Coreg.  - Continue anticoagulation with Coumadin.  Elevated liver enzymes - Secondary to alcohol induced hepatitis - LFT's better since admission. AST 470 --> 218; ALT 212 --> 147; TB 5.2 --> 2.7; ALP 149 --> WNL - Abd [place] did not show acute cholecystitis. - Follow up LFT's in am.  Alcohol abuse - CIWA on admission but now stopped. No reports of withdrawals.   Anemia of chronic disease, macrocytic - Likely secondary to bone marrow suppression from history of alcohol abuse. - Hemoglobin is stable at 11.4.  Thrombocytopenia - Likely secondary to bone marrow suppression from history of alcohol abuse.  - Platelet coutn has spontaneously improved to normal limit.   Dyslipidemia  - Once LFTs improve we will resume statin therapy  Morbid obesity - Body mass index is 44.27 kg/(m^2). - Counseled on diet  - Nutrition consulted  Chronic LE venous stasis - Wound care consulted     DVT Prophylaxis  - On anticoagulation with coumadin  - INR at therapeutic level   Code Status: Full.  Family Communication:  plan of care discussed with the patient; family not at the bedside this morning. Disposition Plan: transfer to telemetry today.   IV access:  Peripheral IV  Procedures and diagnostic studies:    Dg Tibia/fibula Left 2015-02-16 No acute osseous injury of the left tibia or fibula.    US Abdomen Complete 02/06/2015 A few small gallstones are identified but there is no evidence of acute cholecystitis. The examination is otherwise negative.   Dg Chest Port 1 View 02/06/2015 Cardiomegaly. No edema or consolidation.   TTE 02/07/2015    Medical Consultants:  Cardiology Dr. Ena Dawley  Infectiosu disease, Dr. Carlyle Basques - phone call only   Other Consultants:  Physical therapy  Wound care Nutrition   IAnti-Infectives:   Zosyn 02/06/2015 --> 02/10/2015 Vanco 02/06/2015 --> 02/10/2015 Rocephin 02/10/2015 -->   Leisa Lenz, MD  [facility] Pager [phone]  Time spent in minutes: 25 minutes  If 7PM-7AM, please contact night-coverage [email or web address] Password [email or web address] 02/10/2015, 10:32 AM   LOS: 4 days    HPI/Subjective: No acute overnight events. Patient reports feeling better and feels if swelling in legs are better.  Objective: Filed Vitals:   02/10/15 0200 02/10/15 0400 02/10/15 0647 02/10/15 0800  BP: 100/47 118/52 114/56   Pulse: 55 71 68   Temp:  98.3 F (36.8 C)  98.5 F (36.9 C)  TempSrc:  Oral  Oral  Resp: 24 18 24  Height:      Weight:      SpO2: 92% 95% 96%     Intake/Output Summary (Last 24 hours) at 02/10/15 1032 Last data filed at 02/10/15 1016  Gross per 24 hour  Intake   1292 ml  Output    150 ml  Net   1142 ml    Exam:   General:  Pt is alert, follows commands appropriately, not in acute distress  Cardiovascular: irregular rhythm, rate controlled, S1/S2 (+)  Respiratory: no wheezing, no rhonchi, no crackles   Abdomen: obese, non tender, (+) BS  Extremities: chronic venous stasis with (+2) LE pitting edema, pulses palpable  Neuro: Grossly nonfocal  Data Reviewed: Basic Metabolic Panel:  Recent Labs Lab 02/05/15 1822 02/06/15 0838 02/06/15 1400 02/07/15 0508 02/08/15 0530  NA 142 140 143 141 142  K 4.7 5.6* 5.4* 5.1 4.7  CL 109 109 109 110 111  CO2 _0 GLUCOSE 89 94 97 87 89  BUN  22 32* 39* 47* 45*  CREATININE 0.90 1.65* 1.78* 2.10* 1.76*  CALCIUM 8.9 8.4 8.5 7.9* 8.1*  MG  --   --  2.0  --   --   PHOS  --   --  5.5*  --   --    Liver Function Tests:  Recent Labs Lab 02/05/15 1822 02/06/15 0838 02/06/15 1400 02/07/15 0508 02/08/15 0530  AST 27 470* 490* 218* 163*  ALT 16 212* 209* 147* 137*  ALKPHOS 150* 149* 147* 106 116  BILITOT 4.1* 5.2* 4.4* 2.7* 2.7*  PROT 7.6 7.2 7.2 5.9* 6.0  ALBUMIN 3.7 3.6 3.6 3.0* 2.8*   No results for input(s): LIPASE, AMYLASE in the last 168 hours. No results for input(s): AMMONIA in the last 168 hours. CBC:  Recent Labs Lab 02/05/15 1822 02/06/15 0838 02/07/15 0508 02/08/15 0530  WBC 7.4 8.8 6.8 5.7  NEUTROABS 5.9  --   --   --   HGB 12.6* 11.8* 10.9* 11.1*  HCT 39.4 37.3* 34.1* 35.1*  MCV 107.4* 109.1* 108.6* 108.7*  PLT 140* 145* 129* 143*   Cardiac Enzymes:  Recent Labs Lab 02/06/15 1502 02/06/15 1933 02/07/15 1756 02/07/15 2320 02/08/15 0530  TROPONINI 2.36* 2.84* 2.31* 1.88* 1.54*   BNP: Invalid input(s): POCBNP CBG: No results for input(s): GLUCAP in the last 168 hours.  Urine culture     Status: None   Collection Time: 02/06/15 12:27 PM  Result Value Ref Range Status   Specimen Description URINE, CLEAN CATCH  Final   Special Requests NONE  Final   Colony Count   Final   Culture   Final    Multiple bacterial morphotypes present, none predominant. Suggest appropriate recollection if clinically indicated.    Report Status 02/07/2015 FINAL  Final  Culture, blood (x 2)     Status: None   Collection Time: 02/06/15  3:01 PM  Result Value Ref Range Status   Specimen Description BLOOD RIGHT HAND  Final   Special Requests BOTTLES DRAWN AEROBIC AND ANAEROBIC 5CC  Final   Culture   Final    VIRIDANS STREPTOCOCCUS STAPHYLOCOCCUS SPECIES (COAGULASE NEGATIVE)    Report Status 02/09/2015 FINAL  Final  Culture, blood (x 2)     Status: None (Preliminary result)   Collection Time: 02/06/15  3:16 PM   Result Value Ref Range Status   Specimen Description BLOOD LEFT ANTECUBITAL  Final   Special Requests BOTTLES DRAWN AEROBIC AND ANAEROBIC 5CC  Final  Culture   Final           BLOOD CULTURE RECEIVED NO GROWTH TO DATE     Report Status PENDING  Incomplete  MRSA PCR Screening     Status: Abnormal   Collection Time: 02/07/15 11:15 AM  Result Value Ref Range Status   MRSA by PCR POSITIVE (A) NEGATIVE Final     Scheduled Meds: . ferrous sulfate  325 mg Oral Daily  . mupirocin ointment  1 application Nasal BID  . piperacillin-tazobactam (ZOSYN)  IV  3.375 g Intravenous Q8H  . vancomycin  1,750 mg Intravenous Q24H  . Warfarin    Does not apply q1800

## 2015-02-10 NOTE — Consult Note (Signed)
WOC wound consult note Reason for Consult: Patient seen for consultation on intertriginous dermatitis in the intraabdominal skin folds beneath he pannus and also for the serum-filled blister resulting from trauma (fall) on the left posterior LE.  He has had a hematoma in the past on the RLE that has to be evacuated and followed as an outpatient by the outpatient wound care center at [facility] hospital. Wound type:trauma (LE), moisture associated skin damage, specifically intertriginous dermatitis (pannus) Pressure Ulcer POA: No Measurement:Serum-filled blister measures 3cm x 2.5cm Wound TFT:DDUKGU to visulaize Drainage (amount, consistency, odor) None Periwound:Left LE is erythematous, edematous and warm to the touch at the area of injury Dressing procedure/placement/frequency: I have provided a bariatric therapeutic sleep surface with low air loss feature to address moisture and pressure. Additionally, I have provided guidance for the Nursing staff in terms of protecting the traumatic injury to the left LE with a petrolatum based non-adherent dressing and securing it in a manner that will not disrupt the blister prematurely. Bilateral pressure redistribution boots are provided to prevent pressure injury to the bilateral heels. WOC nursing team will not follow, but will remain available to this patient, the nursing and medical team.  Please re-consult if needed. Thanks, Ladona Mow, MSN, RN, GNP, [place], CWON-AP [phone])

## 2015-02-11 DIAGNOSIS — L03119 Cellulitis of unspecified part of limb: Secondary | ICD-10-CM

## 2015-02-11 DIAGNOSIS — R601 Generalized edema: Secondary | ICD-10-CM

## 2015-02-11 LAB — COMPREHENSIVE METABOLIC PANEL
ALT: 87 U/L — ABNORMAL HIGH (ref 17–63)
ALT: 74 U/L — ABNORMAL HIGH (ref 17–63)
ANION GAP: 9 (ref 5–15)
AST: 66 U/L — AB (ref 15–41)
AST: 54 U/L — AB (ref 15–41)
Albumin: 2.9 g/dL — ABNORMAL LOW (ref 3.5–5.0)
Albumin: 2.9 g/dL — ABNORMAL LOW (ref 3.5–5.0)
Alkaline Phosphatase: 132 U/L — ABNORMAL HIGH (ref 38–126)
Alkaline Phosphatase: 135 U/L — ABNORMAL HIGH (ref 38–126)
Anion gap: 3 — ABNORMAL LOW (ref 5–15)
BILIRUBIN TOTAL: 2.1 mg/dL — AB (ref 0.3–1.2)
BUN: 36 mg/dL — ABNORMAL HIGH (ref 6–20)
BUN: 31 mg/dL — AB (ref 6–20)
CALCIUM: 8.5 mg/dL — AB (ref 8.9–10.3)
CHLORIDE: 110 mmol/L (ref 101–111)
CO2: 25 mmol/L (ref 22–32)
CO2: 29 mmol/L (ref 22–32)
Calcium: 8.7 mg/dL — ABNORMAL LOW (ref 8.9–10.3)
Chloride: 108 mmol/L (ref 101–111)
Creatinine, Ser: 1.12 mg/dL (ref 0.61–1.24)
Creatinine, Ser: 1.09 mg/dL (ref 0.61–1.24)
GFR calc Af Amer: >60 mL/min (ref >60)
GFR calc non Af Amer: >60 mL/min (ref >60)
GFR calc non Af Amer: >60 mL/min (ref >60)
GLUCOSE: 120 mg/dL — AB (ref 70–99)
Glucose, Bld: 103 mg/dL — ABNORMAL HIGH (ref 70–99)
POTASSIUM: 4.9 mmol/L (ref 3.5–5.1)
Potassium: 4.7 mmol/L (ref 3.5–5.1)
SODIUM: 144 mmol/L (ref 135–145)
Sodium: 140 mmol/L (ref 135–145)
TOTAL PROTEIN: 6.6 g/dL (ref 6.5–8.1)
Total Bilirubin: 1.9 mg/dL — ABNORMAL HIGH (ref 0.3–1.2)
Total Protein: 6.6 g/dL (ref 6.5–8.1)

## 2015-02-11 LAB — CBC
HEMATOCRIT: 36.7 % — AB (ref 39.0–52.0)
Hemoglobin: 11.3 g/dL — ABNORMAL LOW (ref 13.0–17.0)
MCH: 33.1 pg (ref 26.0–34.0)
MCHC: 30.8 g/dL (ref 30.0–36.0)
MCV: 107.6 fL — AB (ref 78.0–100.0)
Platelets: 153 10*3/uL (ref 150–400)
RBC: 3.41 MIL/uL — ABNORMAL LOW (ref 4.22–5.81)
RDW: 14.6 % (ref 11.5–15.5)
WBC: 6.3 10*3/uL (ref 4.0–10.5)

## 2015-02-11 LAB — PROTIME-INR
INR: 2.3 — ABNORMAL HIGH (ref 0.00–1.49)
Prothrombin Time: 25.5 seconds — ABNORMAL HIGH (ref 11.6–15.2)

## 2015-02-11 MED ORDER — WARFARIN SODIUM 5 MG PO TABS
5.0000 mg | ORAL_TABLET | Freq: Once | ORAL | Status: AC
  Administered 2015-02-11: 5 mg via ORAL
  Filled 2015-02-11: qty 1

## 2015-02-11 MED ORDER — FUROSEMIDE 40 MG PO TABS
40.0000 mg | ORAL_TABLET | Freq: Two times a day (BID) | ORAL | Status: DC
  Administered 2015-02-12 – 2015-02-13 (×3): 40 mg via ORAL
  Filled 2015-02-11 (×3): qty 1

## 2015-02-11 MED ORDER — CARVEDILOL 3.125 MG PO TABS
3.1250 mg | ORAL_TABLET | Freq: Two times a day (BID) | ORAL | Status: DC
  Administered 2015-02-12 – 2015-02-13 (×3): 3.125 mg via ORAL
  Filled 2015-02-11 (×3): qty 1

## 2015-02-11 NOTE — Care Management Note (Signed)
  Page 1 of 1   02/11/2015     12:27:59 PM CARE MANAGEMENT NOTE 02/11/2015  Patient:  [name], [name]   Account Number:  [id number]  Date Initiated:  02/11/2015  Documentation initiated by:  DAVIS,RHONDA  Subjective/Objective Assessment:   NSTEMI:     Action/Plan:   will follow   Anticipated DC Date:  02/14/2015   Anticipated DC Plan:  HOME/SELF CARE  In-house referral  NA  NA      DC Planning Services  NA  NA      Choice offered to / List presented to:             Status of service:  In process, will continue to follow Medicare Important Message given?   (If response is "NO", the following Medicare IM given date fields will be blank) Date Medicare IM given:   Medicare IM given by:   Date Additional Medicare IM given:   Additional Medicare IM given by:    Discharge Disposition:    Per UR Regulation:    If discussed at Long Length of Stay Meetings, dates discussed:    Comments:  Feb 11, 2015/Rhonda L. Earlene Plater, RN, BSN, CCM. Case Management [employer] [phone] No discharge needs present of time of review.

## 2015-02-11 NOTE — Progress Notes (Signed)
[facility] PROGRESS NOTE  BRYLAN DEC ZOX:[id number] DOB: [birth year] DOA: 02/06/2015 PCP: [name], MD  Brief narrative 69 year old male with history of nonischemic artery myopathy, alcoholism, chronic A. fib on Coumadin, multiple falls, obesity, hypertension, history of V. tach presented with fall from bed. As per EMS patient was found to be covered in urine and contrast. On admission patient was hypotensive with low-grade fever, tachycardic with hypoxia. He was briefly confused as well. Blood work done showed hemoglobin of 11.8, potassium of 5.6, creatinine 1.6, BUN of 32, transaminitis with AST of 470 and ALT of 212, alkaline phosphatase of 149, lactic acid of 2.1, BNP of 1100 INR of 2.51 and troponin of 0.83. Patient found to have bilateral leg cellulitis with hospital course prolonged due to 1/2 blood culture growing strep viridans and coag-negative staph. Patient started on empiric vancomycin and Zosyn. 2-D echo was done which was negative for vegetation or thrombus.   Assessment/Plan: Sepsis secondary to bilateral lower leg cellulitis and bacteremia  Sepsis now resolved. Repeat blood culture without any growth. Empiric IV vancomycin and Zosyn switch to IV Rocephin. Will discuss with ID in the morning regarding antibiotic course.   NSTEMI Seen by cardiology and thinning this is likely demand ischemia. Recommend outpatient nuclear stress test.  Acute kidney injury Possibly related to sepsis and patient being on Lasix and ARB. Renal function now at baseline. Will start him Lasix 40 mg twice a day as patient gaining weight.  Hyperkalemia Oral potassium supplements held. Now resolved.   Nonischemic cardiomyopathy. Patient slowly gaining weight and has 2.2 L positive balance. Will start him on Lasix 40 mg place a day. Blood pressure is stable. Resume ARB in a.m. if renal function stable.  A. fib with RVR Currently rate controlled. Resume Coreg. (Was held for hypertension).  On Coumadin with therapeutic INR  Hyportension Blood pressure currently stable.  Bilateral lower extremity cellulitis.  Wound consult appreciated.   Transaminitis Secondary to alcoholic hepatitis. Abdominal ultrasound unremarkable. LFTs trending down. Patient reports quitting alcohol for past few months. Held statins. Monitor LFTs in the morning.   Diet: Heart healthy  DVT prophylaxis: On warfarin  Code Status: Full code Family Communication: None at bedside Disposition Plan: Skilled nursing facility per PT. Possibly in the next 48 hours   Consultants:  Cardiology  Procedures:  2-D echo  Antibiotics: IV vancomycin and Zosyn  4/28-5/1  IV Rocephin since 5/1  HPI/Subjective: Patient seen and examined. Denies any symptoms. Stable overnight. Afebrile.  Objective: Filed Vitals:   02/11/15 1302  BP: 114/63  Pulse: 64  Temp: 98.7 F (37.1 C)  Resp: 18    Intake/Output Summary (Last 24 hours) at 02/11/15 1827 Last data filed at 02/11/15 1700  Gross per 24 hour  Intake   1063 ml  Output   1275 ml  Net   -212 ml   Filed Weights   02/07/15 2358 02/08/15 0500 02/09/15 0500  Weight: 148.1 kg (326 lb 8 oz) 148.1 kg (326 lb 8 oz) 149.4 kg (329 lb 5.9 oz)    Exam:   General:  Elderly disheveled male in no acute distress  HEENT: No pallor, moist oral mucosa, no JVD, supple neck  Cardiovascular: S1 and S2 irregular, no murmurs rub or gallop  Respiratory: Clear to auscultation bilaterally, no added sounds  Abdomen: Soft, nondistended, nontender, bowel sounds present  Musculoskeletal: Bilateral cellulitis with 1+ pitting edema, blister over her left tibia  CNS: Alert and oriented  Data Reviewed: Basic Metabolic Panel:  Recent Labs Lab 02/06/15 1400 02/07/15 0508 02/08/15 0530 02/10/15 1043 02/11/15 0401  NA 143 141 142 139 144  K 5.4* 5.1 4.7 4.7 4.7  CL 109 110 111 109 110  CO2 GLUCOSE 97 87 89 99 103*  BUN 39* 47* 45* 40* 36*   CREATININE 1.78* 2.10* 1.76* 1.32* 1.12  CALCIUM 8.5 7.9* 8.1* 8.3* 8.7*  MG 2.0  --   --   --   --   PHOS 5.5*  --   --   --   --    Liver Function Tests:  Recent Labs Lab 02/06/15 0838 02/06/15 1400 02/07/15 0508 02/08/15 0530 02/11/15 0401  AST 470* 490* 218* 163* 66*  ALT 212* 209* 147* 137* 87*  ALKPHOS 149* 147* 106 116 132*  BILITOT 5.2* 4.4* 2.7* 2.7* 1.9*  PROT 7.2 7.2 5.9* 6.0 6.6  ALBUMIN 3.6 3.6 3.0* 2.8* 2.9*   No results for input(s): LIPASE, AMYLASE in the last 168 hours. No results for input(s): AMMONIA in the last 168 hours. CBC:  Recent Labs Lab 02/05/15 1822 02/06/15 0838 02/07/15 0508 02/08/15 0530 02/10/15 1043 02/11/15 0401  WBC 7.4 8.8 6.8 5.7 5.4 6.3  NEUTROABS 5.9  --   --   --   --   --   HGB 12.6* 11.8* 10.9* 11.1* 11.4* 11.3*  HCT 39.4 37.3* 34.1* 35.1* 35.7* 36.7*  MCV 107.4* 109.1* 108.6* 108.7* 106.9* 107.6*  PLT 140* 145* 129* 143* 155 153   Cardiac Enzymes:  Recent Labs Lab 02/06/15 1502 02/06/15 1933 02/07/15 1756 02/07/15 2320 02/08/15 0530  TROPONINI 2.36* 2.84* 2.31* 1.88* 1.54*   BNP (last 3 results)  Recent Labs  02/06/15 0838  BNP 1119.2*    ProBNP (last 3 results) No results for input(s): PROBNP in the last 8760 hours.  CBG: No results for input(s): GLUCAP in the last 168 hours.  Recent Results (from the past 240 hour(s))  Urine culture     Status: None   Collection Time: 02/06/15 12:27 PM  Result Value Ref Range Status   Specimen Description URINE, CLEAN CATCH  Final   Special Requests NONE  Final   Colony Count   Final    >=100,000 COLONIES/ML Performed at [facility]    Culture   Final    Multiple bacterial morphotypes present, none predominant. Suggest appropriate recollection if clinically indicated. Performed at [facility]    Report Status 02/07/2015 FINAL  Final  Culture, blood (x 2)     Status: None   Collection Time: 02/06/15  3:01 PM  Result Value Ref Range Status    Specimen Description BLOOD RIGHT HAND  Final   Special Requests BOTTLES DRAWN AEROBIC AND ANAEROBIC 5CC  Final   Culture   Final    VIRIDANS STREPTOCOCCUS STAPHYLOCOCCUS SPECIES (COAGULASE NEGATIVE) Note: THE SIGNIFICANCE OF ISOLATING THIS ORGANISM FROM A SINGLE SET OF BLOOD CULTURES WHEN MULTIPLE SETS ARE DRAWN IS UNCERTAIN. PLEASE NOTIFY THE MICROBIOLOGY DEPARTMENT WITHIN ONE WEEK IF SPECIATION AND SENSITIVITIES ARE REQUIRED. Note: Gram Stain Report Called to,Read Back By and Verified With: KENEISHA RN ON 2W AT 1430 [id number] BY CASTC Performed at [facility]    Report Status 02/09/2015 FINAL  Final  Culture, blood (x 2)     Status: None (Preliminary result)   Collection Time: 02/06/15  3:16 PM  Result Value Ref Range Status   Specimen Description BLOOD LEFT ANTECUBITAL  Final   Special Requests BOTTLES DRAWN  AEROBIC AND ANAEROBIC 5CC  Final   Culture   Final           BLOOD CULTURE RECEIVED NO GROWTH TO DATE CULTURE WILL BE HELD FOR 5 DAYS BEFORE ISSUING A FINAL NEGATIVE REPORT Performed at [facility]    Report Status PENDING  Incomplete  MRSA PCR Screening     Status: Abnormal   Collection Time: 02/07/15 11:15 AM  Result Value Ref Range Status   MRSA by PCR POSITIVE (A) NEGATIVE Final    Comment:        The GeneXpert MRSA Assay (FDA approved for NASAL specimens only), is one component of a comprehensive MRSA colonization surveillance program. It is not intended to diagnose MRSA infection nor to guide or monitor treatment for MRSA infections. RESULT CALLED TO, READ BACK BY AND VERIFIED WITH: GRAHAM,K @ 1627 ON [id number] BY POTEAT,S   Culture, blood (routine x 2)     Status: None (Preliminary result)   Collection Time: 02/10/15 11:55 AM  Result Value Ref Range Status   Specimen Description BLOOD LEFT ARM  10 ML IN [facility] BOTTLE  Final   Special Requests NONE  Final   Culture   Final           BLOOD CULTURE RECEIVED NO GROWTH TO DATE CULTURE WILL BE HELD  FOR 5 DAYS BEFORE ISSUING A FINAL NEGATIVE REPORT Performed at [facility]    Report Status PENDING  Incomplete  Culture, blood (routine x 2)     Status: None (Preliminary result)   Collection Time: 02/10/15 12:03 PM  Result Value Ref Range Status   Specimen Description BLOOD RIGHT HAND  10 MLL IN [facility] BOTTLE  Final   Special Requests NONE  Final   Culture   Final           BLOOD CULTURE RECEIVED NO GROWTH TO DATE CULTURE WILL BE HELD FOR 5 DAYS BEFORE ISSUING A FINAL NEGATIVE REPORT Performed at [facility]    Report Status PENDING  Incomplete     Studies: No results found.  Scheduled Meds: . antiseptic oral rinse  7 mL Mouth Rinse BID  . cefTRIAXone (ROCEPHIN)  IV  2 g Intravenous Q24H  . Chlorhexidine Gluconate Cloth  6 each Topical Q0600  . ferrous sulfate  325 mg Oral Daily  . mupirocin ointment  1 application Nasal BID  . prednisoLONE acetate  1 drop Right Eye BID  . sodium chloride  3 mL Intravenous Q12H  . timolol  1 drop Right Eye BID  . Warfarin - Pharmacist Dosing Inpatient   Does not apply q1800   Continuous Infusions:    Time spent: 25 minutes    Bayan Hedstrom  [facility] Pager [phone]. If 7PM-7AM, please contact night-coverage at [email or web address], password [facility] 02/11/2015, 6:27 PM  LOS: 5 days

## 2015-02-11 NOTE — Progress Notes (Signed)
Subjective: No chest pain, no SOB   Objective: Vital signs in last 24 hours: Temp:  [97.4 F (36.3 C)-98.4 F (36.9 C)] 98.4 F (36.9 C) (05/02 0732) Pulse Rate:  [55-74] 55 (05/02 0800) Resp:  [13-24] 24 (05/02 0800) BP: (85-138)/(50-106) 106/53 mmHg (05/02 0800) SpO2:  [95 %-98 %] 96 % (05/02 0800) Weight change:  Last BM Date: 02/05/15 Intake/Output from previous day: +310 05/01 0701 - 05/02 0700 In: 1130 [P.O.:840; I.V.:240; IV Piggyback:50] Out: 800 [Urine:800] Intake/Output this shift: Total I/O In: 13 [I.V.:13] Out: 400 [Urine:400]  PE: General:Pleasant affect, NAD Skin:Warm and dry, brisk capillary refill, except legs HEENT:normocephalic, sclera clear, mucus membranes moist Heart:S1S2 irreg irreg without murmur, gallup, rub or click Lungs:clear without rales, rhonchi, or wheezes QJJ:HERDE, soft, non tender, + BS, do not palpate liver spleen or masses Ext:2-3+ lower ext edema to knees, Lt > rt, 2+ radial pulses, discolored lower ext. Neuro:alert and oriented X 3, MAE, follows commands, + facial symmetry Tele:  A fib mostly in the 60s one episode of wide complex tachycardia, poss aberrancy.     Lab Results:  Recent Labs  02/10/15 1043 02/11/15 0401  WBC 5.4 6.3  HGB 11.4* 11.3*  HCT 35.7* 36.7*  PLT 155 153   BMET  Recent Labs  02/10/15 1043 02/11/15 0401  NA 139 144  K 4.7 4.7  CL 109 110  CO2 24 25  GLUCOSE 99 103*  BUN 40* 36*  CREATININE 1.32* 1.12  CALCIUM 8.3* 8.7*   No results for input(s): TROPONINI in the last 72 hours.  Invalid input(s): CK, MB  Lab Results  Component Value Date   CHOL 79 02/07/2015   HDL 31* 02/07/2015   LDLCALC 37 02/07/2015   TRIG 54 02/07/2015   CHOLHDL 2.5 02/07/2015   Lab Results  Component Value Date   HGBA1C 5.9* 02/06/2015     Lab Results  Component Value Date   TSH 1.163 02/06/2015    Hepatic Function Panel  Recent Labs  02/11/15 0401  PROT 6.6  ALBUMIN 2.9*  AST 66*    ALT 87*  ALKPHOS 132*  BILITOT 1.9*   No results for input(s): CHOL in the last 72 hours. No results for input(s): PROTIME in the last 72 hours.     Studies/Results: ECHO: Study Conclusions  - Left ventricle: The cavity size was normal. Systolic function was normal. The estimated ejection fraction was in the range of 55% to 60%. Wall motion was normal; there were no regional wall motion abnormalities. There was a reduced contribution of atrial contraction to ventricular filling, due to increased ventricular diastolic pressure or atrial contractile dysfunction. Doppler parameters are consistent with a reversible restrictive pattern, indicative of decreased left ventricular diastolic compliance and/or increased left atrial pressure (grade 3 diastolic dysfunction). - Aortic valve: Trileaflet; mildly thickened, mildly calcified leaflets. - Mitral valve: Calcified annulus. Mild, late systolicprolapse, involving the anterior leaflet. There was mild to moderate regurgitation directed eccentrically. - Right ventricle: The cavity size was moderately dilated. Wall thickness was normal. Systolic function was severely reduced. - Pulmonary arteries: PA peak pressure: 40 mm Hg (S).  Impressions:  - The right ventricular systolic pressure was increased consistent with moderate pulmonary hypertension.   Medications: I have reviewed the patient's current medications. Scheduled Meds: . antiseptic oral rinse  7 mL Mouth Rinse BID  . cefTRIAXone (ROCEPHIN)  IV  2 g Intravenous Q24H  . Chlorhexidine Gluconate Cloth  6 each Topical  [id number]  . ferrous sulfate  325 mg Oral Daily  . mupirocin ointment  1 application Nasal BID  . prednisoLONE acetate  1 drop Right Eye BID  . sodium chloride  3 mL Intravenous Q12H  . timolol  1 drop Right Eye BID  . warfarin  5 mg Oral ONCE-1800  . Warfarin - Pharmacist Dosing Inpatient   Does not apply q1800   Continuous  Infusions:  PRN Meds:.sodium chloride, LORazepam, morphine injection, ondansetron **OR** ondansetron (ZOFRAN) IV, oxyCODONE-acetaminophen, sodium chloride  Assessment/Plan:  69 y.o. 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 Fib on coumadin therapy, multiple fall, obesity, hypertension, hx of V. Tach, anemia, hypoxia, GERD, who was consulted for possible cardiac etiology of multiple fall- he stated he tripped, no syncope and no lightheadedness and multiple lab abnormalities.  1. NSTEMI: Likely related to demand ischemia. Asymptomatic. Troponins down to 1.54 on 4/29 from pk of 2.84.  Normal LV systolic function and regional wall motion with grade III diastolic dysfunction. Unable to add beta blockers and ACEI's due to hypotension and bradycardia. Received IV fluid bolus. Not on statins due to elevated transaminases. Currently on warfarin for atrial fibrillation. No plans for invasive strategy. Plan:  consider nuclear stress test, perhaps as outpatient.    2. Chronic diastolic heart failure and chronic right heart failure, with severely reduced RV systolic function: No plans for diuretics at present due to CKD and hypotension. SCr 1.76 on 4/29.  (+2395 since admit)  Wt 329 up from 323 on admit  BP 85/50 to 138/76, within 2 hour periods  3. Chronic Atrial fibrillation: Slow ventricular response, not on AV nodal blocking agents. INR therapeutic at 2.30 today. On warfarin. No longer on Coreg ( was on 6.25 BID as outpt. ) may need to resume lower dose.   4. Sepsis/lower extremity cellulitis: On vancomycin and Zosyn.  5. Chronic anticoagulation CHAD2S2VASC score 2  Today INR 2.30  6. Acute on chronic CKD, now improved with Cr 1.12   7. Abnormal LFTs improving       LOS: 5 days   Time spent with pt. :15 minutes. [facility] R  Nurse Practitioner Certified Pager [phone] or after 5pm and on weekends call [phone] 02/11/2015, 9:23 AM   History  and all data above reviewed.  Patient examined.  I agree with the findings as above.  The patient exam reveals WJX:[id number]  ,  Lungs: Clear  ,  Abd: Obese, Ext Anasarca  .  All available labs, radiology testing, previous records reviewed. Agree with documented assessment and plan. Atrial fib, rate OK.  Continue anticoagulation.  I raised his legs today.  Perhaps restart low dose diuretic in the AM if BP allows.    Fayrene Fearing [facility]  11:33 AM  02/11/2015

## 2015-02-11 NOTE — Plan of Care (Signed)
Problem: Phase I Progression Outcomes Goal: OOB as tolerated unless otherwise ordered Outcome: Progressing PT consult today.  Problem: Phase II Progression Outcomes Goal: Progress activity as tolerated unless otherwise ordered Outcome: Progressing Pt tolerates chair position in bed.  Problem: Phase I Progression Outcomes Goal: Dyspnea controlled at rest (HF) Outcome: Progressing 02 sats stable on room air- until pt falls asleep, then 02 sat's drop into the mid-80's.  Pt does not wear 02 at home. Goal: Voiding-avoid urinary catheter unless indicated Outcome: Completed/Met Date Met:  02/11/15 Condom catheter intact.  Pt is not incontinent, but spills urine all over abdomen everytime he uses the urinal.  Pt is not able to manage urinating into urinal, condom catheter has worked well for this pt.

## 2015-02-11 NOTE — Evaluation (Signed)
Physical Therapy Evaluation Patient Details Name: [name] MRN: [id number] DOB: [birth year] Today's Date: 02/11/2015   History of Present Illness  69 y.o. 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 Tach, anemia, hypoxia, GERD admitted with NSTEMI and fall at home with L LE wound/hematoma  Clinical Impression  Pt admitted with above diagnosis. Pt currently with functional limitations due to the deficits listed below (see PT Problem List).  Pt will benefit from skilled PT to increase their independence and safety with mobility to allow discharge to the venue listed below.   Pt reports falling on step at home and states he has not been out of bed since last Tuesday.  Pt presents with decreased balance, generalized weakness, limited endurance, and requires assist for mobility at this time therefore recommending SNF.  Pt states last hospital admission a few years ago with d/c to a SNF and reports being very motivated to return to previous level of function and improve mobility.    Follow Up Recommendations SNF;Supervision/Assistance - 24 hour    Equipment Recommendations  None recommended by PT    Recommendations for Other Services       Precautions / Restrictions Precautions Precautions: Fall      Mobility  Bed Mobility Overal bed mobility: Needs Assistance;+2 for physical assistance Bed Mobility: Supine to Sit;Sit to Supine     Supine to sit: +2 for physical assistance;Mod assist Sit to supine: +2 for physical assistance;Mod assist   General bed mobility comments: assist for trunk upright and LEs onto bed  Transfers Overall transfer level: Needs assistance Equipment used: Rolling walker (2 wheeled) Transfers: Sit to/from Stand Sit to Stand: Mod assist;From elevated surface;+2 physical assistance         General transfer comment: pt unable to rise without  assist, verbal cues for safe technique, also required steadying and cues to push down on RW for UE support  Ambulation/Gait Ambulation/Gait assistance:  (deferred due to min assist marching in place)              Stairs            Wheelchair Mobility    Modified Rankin (Stroke Patients Only)       Balance Overall balance assessment: Needs assistance;History of Falls         Standing balance support: Bilateral upper extremity supported Standing balance-Leahy Scale: Poor Standing balance comment: requires RW and cues to use RW appropriately to steady, min assist for balance with marching in place (with bed behind pt), able to tolerate marching in place (however feet just barely lifted from floor) for 3 minutes                             Pertinent Vitals/Pain Pain Assessment: No/denies pain    Home Living Family/patient expects to be discharged to:: Private residence Living Arrangements: Parent (74 year old father)   Type of Home: House Home Access: Stairs to enter   Entrance Stairs-Number of Steps: 1 Home Layout: One level Home Equipment: Environmental consultant - 2 wheels;Bedside commode      Prior Function Level of Independence: Independent   Gait / Transfers Assistance Needed: reports independently ambulates           Hand Dominance        Extremity/Trunk Assessment  Lower Extremity Assessment: Generalized weakness;LLE deficits/detail   LLE Deficits / Details: L lower leg lateral wound dressed with hematoma     Communication   Communication: No difficulties  Cognition Arousal/Alertness: Awake/alert Behavior During Therapy: WFL for tasks assessed/performed Overall Cognitive Status: Within Functional Limits for tasks assessed                      General Comments      Exercises        Assessment/Plan    PT Assessment Patient needs continued PT services  PT Diagnosis Difficulty walking;Generalized weakness    PT Problem List Decreased strength;Decreased activity tolerance;Decreased mobility;Decreased balance;Decreased knowledge of use of DME;Obesity  PT Treatment Interventions DME instruction;Gait training;Functional mobility training;Patient/family education;Therapeutic activities;Therapeutic exercise;Balance training   PT Goals (Current goals can be found in the Care Plan section) Acute Rehab PT Goals PT Goal Formulation: With patient Time For Goal Achievement: 02/25/15 Potential to Achieve Goals: Good    Frequency Min 3X/week   Barriers to discharge        Co-evaluation               End of Session   Activity Tolerance: Patient limited by fatigue Patient left: in bed;with call bell/phone within reach           Time: 9470-9628 PT Time Calculation (min) (ACUTE ONLY): 27 min   Charges:   PT Evaluation $Initial PT Evaluation Tier I: 1 Procedure PT Treatments $Therapeutic Activity: 8-22 mins   PT G Codes:        Makari Sanko,KATHrine E 02/11/2015, 2:49 PM Zenovia Jarred, PT, DPT 02/11/2015 Pager: [phone]

## 2015-02-11 NOTE — Progress Notes (Signed)
CSW continuing to follow for potential disposition needs.  Pt assessed by [facility] ED CSW when pt came to hospital and pt stated at that time that he does not feel there are any problems with his current living situation and does not want facility placement.  CSW awaiting PT/OT evaluations in order to determine if follow up needs to be made with pt to further discuss disposition.   CSW to continue to follow to assist as needed and appropriate.   Loletta Specter, MSW, LCSW Clinical Social Work [phone]

## 2015-02-11 NOTE — Plan of Care (Signed)
Problem: Food- and Nutrition-Related Knowledge Deficit (NB-1.1) Goal: Coordination of nutrition care Consultation with, referral to, or coordination of nutrition care with other providers, institutions, or agencies that can assist in treating or managing nutrition-related problems. Outcome: Adequate for Discharge Received consult for diet education.  Pt is very knowledgeable about cooking and healthy food preparations. Pt cooks for himself and his 68 y.o. Father whom he looks after. Diet recall shows that pt chooses foods from all food groups with emphasis on low fat and low sodium options. Pt only consumes SF drinks.  Provided handouts on Tips for Weight Loss and Healthy Eating by [employer] and answered questions pt had about his food choices. Pt may be going to SNF for rehab where he has been for the last couple month. He does not have the option to eat what he likes while there. Pt feels this may have contributed to recent weight gain and constantly swelling legs as he is not able to read labels to estimate nutrient content of foods.  Teach back method used, expect good compliance.    Yashika Mask A. Wandra Babin Dietetic Intern Pager: [phone] 02/11/2015 1:51 PM

## 2015-02-11 NOTE — Progress Notes (Signed)
ANTICOAGULATION CONSULT NOTE - Follow Up Consult  Pharmacy Consult for Coumadin Indication: atrial fibrillation  No Known Allergies  Patient Measurements: Height: 6' (182.9 cm) Weight: (!) 329 lb 5.9 oz (149.4 kg) IBW/kg (Calculated) : 77.6  Vital Signs: Temp: 98.4 F (36.9 C) (05/02 0732) Temp Source: Oral (05/02 0732) BP: 138/76 mmHg (05/02 0400) Pulse Rate: 67 (05/01 2200)  Labs:  Recent Labs  02/09/15 0350 02/10/15 0409 02/10/15 1043 02/11/15 0401  HGB  --   --  11.4* 11.3*  HCT  --   --  35.7* 36.7*  PLT  --   --  155 153  LABPROT 30.9* 26.1*  --  25.5*  INR 2.94* 2.37*  --  2.30*  CREATININE  --   --  1.32* 1.12   Estimated Creatinine Clearance: 96.2 mL/min (by C-G formula based on Cr of 1.12).  Medications:  Scheduled:  . antiseptic oral rinse  7 mL Mouth Rinse BID  . cefTRIAXone (ROCEPHIN)  IV  2 g Intravenous Q24H  . Chlorhexidine Gluconate Cloth  6 each Topical Q0600  . ferrous sulfate  325 mg Oral Daily  . mupirocin ointment  1 application Nasal BID  . prednisoLONE acetate  1 drop Right Eye BID  . sodium chloride  3 mL Intravenous Q12H  . timolol  1 drop Right Eye BID  . Warfarin - Pharmacist Dosing Inpatient   Does not apply q1800   Assessment: Patient is a 69 y.o M with hx Cardiomyopathy, EtOH abuse, chronic LE swelling, and on coumadin PTA for afib. He presented to the ED 4/27 s/p fall with c/o pain and swelling of LE. Warfarin per pharmacy. Home dose reported as 2.5mg  daily except 5mg  on MWF. INR 4/28 supratherapeutic after 5mg  dose 4/27, no Warfarin from 4/29. INR decreased to to 2.37 on 5/1, so 5mg  was resumed.  INR therapeutic (2.3) today  CBC unchanged, low,stable. No bleeding reported/documented.  Reg diet ordered, good po intake  Broad-spectrum abx dc'd 5/1, effects of increased sensitivity to warfarin may persist for a day or two. Ceftriaxone should have minimal effect on INR.  Goal of Therapy:  INR 2-3   Plan:   Warfarin 5mg   today @ 1800  Daily PT/INR  Loralee Pacas, PharmD, BCPS Pager: [phone]  02/11/2015, 8:38 AM

## 2015-02-12 LAB — COMPREHENSIVE METABOLIC PANEL
ALK PHOS: 128 U/L — AB (ref 38–126)
ALT: 61 U/L (ref 17–63)
ANION GAP: 6 (ref 5–15)
AST: 42 U/L — ABNORMAL HIGH (ref 15–41)
Albumin: 2.7 g/dL — ABNORMAL LOW (ref 3.5–5.0)
BUN: 27 mg/dL — AB (ref 6–20)
CALCIUM: 8.6 mg/dL — AB (ref 8.9–10.3)
CO2: 26 mmol/L (ref 22–32)
CREATININE: 0.86 mg/dL (ref 0.61–1.24)
Chloride: 108 mmol/L (ref 101–111)
GFR calc Af Amer: >60 mL/min (ref >60)
GFR calc non Af Amer: >60 mL/min (ref >60)
Glucose, Bld: 111 mg/dL — ABNORMAL HIGH (ref 70–99)
POTASSIUM: 4.4 mmol/L (ref 3.5–5.1)
Sodium: 140 mmol/L (ref 135–145)
TOTAL PROTEIN: 6.3 g/dL — AB (ref 6.5–8.1)
Total Bilirubin: 1.8 mg/dL — ABNORMAL HIGH (ref 0.3–1.2)

## 2015-02-12 LAB — CULTURE, BLOOD (ROUTINE X 2): Culture: NO GROWTH

## 2015-02-12 LAB — PROTIME-INR
INR: 2.28 — ABNORMAL HIGH (ref 0.00–1.49)
PROTHROMBIN TIME: 25.3 s — AB (ref 11.6–15.2)

## 2015-02-12 MED ORDER — SENNOSIDES-DOCUSATE SODIUM 8.6-50 MG PO TABS
1.0000 | ORAL_TABLET | Freq: Every day | ORAL | Status: DC | PRN
  Administered 2015-02-12 – 2015-02-13 (×2): 1 via ORAL
  Filled 2015-02-12 (×2): qty 1

## 2015-02-12 MED ORDER — WARFARIN SODIUM 2.5 MG PO TABS
2.5000 mg | ORAL_TABLET | Freq: Once | ORAL | Status: AC
  Administered 2015-02-12: 2.5 mg via ORAL
  Filled 2015-02-12: qty 1

## 2015-02-12 NOTE — Progress Notes (Signed)
ANTICOAGULATION CONSULT NOTE - Follow Up Consult  Pharmacy Consult for Coumadin Indication: atrial fibrillation  No Known Allergies  Patient Measurements: Height: 6' (182.9 cm) Weight: (!) 329 lb 5.9 oz (149.4 kg) IBW/kg (Calculated) : 77.6  Vital Signs: Temp: 98.1 F (36.7 C) (05/03 0554) Temp Source: Oral (05/03 0554) BP: 136/74 mmHg (05/03 0801) Pulse Rate: 68 (05/03 0801)  Labs:  Recent Labs  02/10/15 0409 02/10/15 1043 02/11/15 0401 02/11/15 1939 02/12/15 0515  HGB  --  11.4* 11.3*  --   --   HCT  --  35.7* 36.7*  --   --   PLT  --  155 153  --   --   LABPROT 26.1*  --  25.5*  --  25.3*  INR 2.37*  --  2.30*  --  2.28*  CREATININE  --  1.32* 1.12 1.09  --    Estimated Creatinine Clearance: 98.9 mL/min (by C-G formula based on Cr of 1.09).  Medications:  Scheduled:  . antiseptic oral rinse  7 mL Mouth Rinse BID  . carvedilol  3.125 mg Oral BID WC  . cefTRIAXone (ROCEPHIN)  IV  2 g Intravenous Q24H  . Chlorhexidine Gluconate Cloth  6 each Topical Q0600  . ferrous sulfate  325 mg Oral Daily  . furosemide  40 mg Oral BID  . prednisoLONE acetate  1 drop Right Eye BID  . sodium chloride  3 mL Intravenous Q12H  . timolol  1 drop Right Eye BID  . Warfarin - Pharmacist Dosing Inpatient   Does not apply q1800   Assessment: Patient is a 69 y.o M with hx Cardiomyopathy, EtOH abuse, chronic LE swelling, and on coumadin PTA for afib. He presented to the ED 4/27 s/p fall with c/o pain and swelling of LE. Warfarin per pharmacy. Home dose reported as 2.5mg  daily except 5mg  on MWF. INR 4/28 supratherapeutic after 5mg  dose 4/27, no Warfarin from 4/29. INR decreased to to 2.37 on 5/1, so 5mg  was resumed.  Today, 02/12/2015  INR therapeutic   CBC unchanged, low,stable. No bleeding reported/documented.  Reg diet ordered, good po intake  Broad-spectrum abx dc'd 5/1, effects of increased sensitivity to warfarin may persist for a day or two. Ceftriaxone should have minimal  effect on INR.  Goal of Therapy:  INR 2-3   Plan:   Warfarin 2.5mg  today @ 1800  Daily PT/INR  Juliette Alcide, PharmD, BCPS.   Pager: [phone] 02/12/2015, 11:16 AM

## 2015-02-12 NOTE — Progress Notes (Signed)
Subjective: No chest pain or SOB  - feeling better  Objective: Vital signs in last 24 hours: Temp:  [98.1 F (36.7 C)-98.7 F (37.1 C)] 98.1 F (36.7 C) (05/03 0554) Pulse Rate:  [52-68] 68 (05/03 0801) Resp:  [18-25] 18 (05/03 0554) BP: (114-136)/(63-74) 136/74 mmHg (05/03 0801) SpO2:  [82 %-98 %] 97 % (05/03 0554) Weight change:  Last BM Date: 02/04/15 Intake/Output from previous day: -449 05/02 0701 - 05/03 0700 In: 1121 [P.O.:1028; I.V.:43; IV Piggyback:50] Out: 1600 [Urine:1600] Intake/Output this shift:    PE: General:Pleasant affect, NAD Skin:Warm and dry, brisk capillary refill HEENT:normocephalic, sclera clear, mucus membranes moist Heart:irre irreg  without murmur, gallup, rub or click Lungs:clear without rales, rhonchi, or wheezes ZHY:QMVHQ, soft, non tender, + BS, do not palpate liver spleen or masses Ext:2+ lower ext edema- more wrinkles  2+ radial pulses Neuro:alert and oriented X 3, MAE, follows commands, + facial symmetry Tele:  A fib rate controlled mostly, in the upper 40s  At night   Lab Results:  Recent Labs  02/10/15 1043 02/11/15 0401  WBC 5.4 6.3  HGB 11.4* 11.3*  HCT 35.7* 36.7*  PLT 155 153   BMET  Recent Labs  02/11/15 0401 02/11/15 1939  NA 144 140  K 4.7 4.9  CL 110 108  CO2 25 29  GLUCOSE 103* 120*  BUN 36* 31*  CREATININE 1.12 1.09  CALCIUM 8.7* 8.5*   No results for input(s): TROPONINI in the last 72 hours.  Invalid input(s): CK, MB  Lab Results  Component Value Date   CHOL 79 02/07/2015   HDL 31* 02/07/2015   LDLCALC 37 02/07/2015   TRIG 54 02/07/2015   CHOLHDL 2.5 02/07/2015   Lab Results  Component Value Date   HGBA1C 5.9* 02/06/2015     Lab Results  Component Value Date   TSH 1.163 02/06/2015    Hepatic Function Panel  Recent Labs  02/11/15 1939  PROT 6.6  ALBUMIN 2.9*  AST 54*  ALT 74*  ALKPHOS 135*  BILITOT 2.1*   No results for input(s): CHOL in the last 72 hours. No  results for input(s): PROTIME in the last 72 hours.     Studies/Results: No results found.  Medications: I have reviewed the patient's current medications. Scheduled Meds: . antiseptic oral rinse  7 mL Mouth Rinse BID  . carvedilol  3.125 mg Oral BID WC  . cefTRIAXone (ROCEPHIN)  IV  2 g Intravenous Q24H  . Chlorhexidine Gluconate Cloth  6 each Topical Q0600  . ferrous sulfate  325 mg Oral Daily  . furosemide  40 mg Oral BID  . mupirocin ointment  1 application Nasal BID  . prednisoLONE acetate  1 drop Right Eye BID  . sodium chloride  3 mL Intravenous Q12H  . timolol  1 drop Right Eye BID  . Warfarin - Pharmacist Dosing Inpatient   Does not apply q1800   Continuous Infusions:  PRN Meds:.sodium chloride, LORazepam, morphine injection, ondansetron **OR** ondansetron (ZOFRAN) IV, oxyCODONE-acetaminophen, sodium chloride  Assessment/Plan: 69 y.o. 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 Fib on coumadin therapy, multiple fall, obesity, hypertension, hx of V. Tach, anemia, hypoxia, GERD, who was consulted for possible cardiac etiology of multiple fall- he stated he tripped, no syncope and no lightheadedness and multiple lab abnormalities.  1. NSTEMI: Likely related to demand ischemia. Asymptomatic. Troponins down to 1.54 on 4/29 from pk of 2.84.  Normal LV systolic function and regional wall motion with grade III diastolic dysfunction. Unable to add beta blockers and ACEI's due to hypotension and bradycardia. Received IV fluid bolus. Not on statins due to elevated transaminases. Currently on warfarin for atrial fibrillation. No plans for invasive strategy. Plan: consider nuclear stress test, perhaps as outpatient.   2. Chronic diastolic heart failure and chronic right heart failure, with severely reduced RV systolic function: SCr 1.76 on 4/29. (+1916 since admit) Wt 329 on 4/30 up from 323 on admit BP 128/71 to 135/76  3. Chronic  Atrial fibrillation: Slow ventricular response, not on AV nodal blocking agents. INR therapeutic at 2.30 today. On warfarin. No longer on Coreg ( was on 6.25 BID as outpt. ) may need to resume lower dose but HR to upper 40s at night.    4. Sepsis/lower extremity cellulitis: On vancomycin and Zosyn. Blood cultures P.  5. Chronic anticoagulation CHAD2S2VASC score 2 Today INR 2.28  6. Acute on chronic CKD, now improved with Cr 1.12 --> today 1.09  7. Abnormal LFTs improving AST 54, ALT 74   Will ask for daily wts and I&O    LOS: 6 days   Time spent with pt. :15 minutes. [facility] R  Nurse Practitioner Certified Pager [phone] or after 5pm and on weekends call [phone] 02/12/2015, 9:42 AM  History and all data above reviewed.  Patient examined.  I agree with the findings as above.  The patient feels better.  Edema is improved.  No chest pain.  No SOB.  The patient exam reveals [id number]  ,  Lungs: Clear  ,  Abd: Positive bowel sounds, no rebound no guarding, Ext severe edema improved.  .  All available labs, radiology testing, previous records reviewed. Agree with documented assessment and plan. Anasarca:  Edema improved.  Tolerating PO diuretic.  Continue.    Elevated enzymes:  No ischemia work up planned at this point.  I will follow as an outpatient.   Fayrene Fearing Damyia Strider  5:27 PM  02/12/2015

## 2015-02-12 NOTE — Progress Notes (Signed)
[facility] PROGRESS NOTE  [name] WUJ:[id number] DOB: 07-06-1946 DOA: 02/06/2015 PCP: [name], MD  Brief narrative 69 year old male with history of nonischemic artery myopathy, alcoholism, chronic A. fib on Coumadin, multiple falls, obesity, hypertension, history of V. tach presented with fall from bed. As per EMS patient was found to be covered in urine and contrast. On admission patient was hypotensive with low-grade fever, tachycardic with hypoxia. He was briefly confused as well. Blood work done showed hemoglobin of 11.8, potassium of 5.6, creatinine 1.6, BUN of 32, transaminitis with AST of 470 and ALT of 212, alkaline phosphatase of 149, lactic acid of 2.1, BNP of 1100 INR of 2.51 and troponin of 0.83. Patient found to have bilateral leg cellulitis with hospital course prolonged due to 1/2 blood culture growing strep viridans and coag-negative staph. Patient started on empiric vancomycin and Zosyn. 2-D echo was done which was negative for vegetation or thrombus.   Assessment/Plan: Sepsis secondary to bilateral lower leg cellulitis and ?bacteremia  Sepsis now resolved. Blood culture on admission growing coag negative staph aureus and related and Streptococcus on 1/2 culture . Repeat blood culture without any growth. Empiric IV vancomycin and Zosyn switch to IV Rocephin. Could possibly be a contaminant. Discussed with ID Dr. Luciana Axe  on the phone who recommends empiric Keflex for cellulitis upon discharge.  NSTEMI Seen by cardiology and thinning this is likely demand ischemia. Recommend outpatient nuclear stress test.  Acute kidney injury Possibly related to sepsis and patient being on Lasix and ARB. Renal function now at baseline.  Patient started on oral Lasix 40 mg twice a day and died easing well. Still has positive balance of close to 2 L  Hyperkalemia Oral potassium supplements held. Now resolved.   Nonischemic cardiomyopathy. Patient having positive balance and  gained weight since admission. Resumed Lasix at 40 mg twice a day. Resume ARB. Monitor renal function a.m.  A. fib with RVR Currently rate controlled. Continue Coreg. On Coumadin with therapeutic INR  Hyportension Blood pressure currently stable.  Bilateral lower extremity cellulitis.  Wound consult appreciated.   Transaminitis Secondary to alcoholic hepatitis. Abdominal ultrasound unremarkable. LFTs trending down. Patient reports quitting alcohol for past few months. Held statins. Monitor LFTs. Patient counseled on smoking cessation.  Diet: Heart healthy  DVT prophylaxis: On warfarin  Code Status: Full code Family Communication: None at bedside Disposition Plan: Skilled nursing facility in a.m. if continues to improve.    Consultants:  Cardiology  Procedures:  2-D echo  Antibiotics: IV vancomycin and Zosyn  4/28-5/1  IV Rocephin since 5/1  HPI/Subjective: Patient seen and examined. Reports breathing to be better and leg swellings to have improved c/o constipation  Objective: Filed Vitals:   02/12/15 0801  BP: 136/74  Pulse: 68  Temp:   Resp:     Intake/Output Summary (Last 24 hours) at 02/12/15 1416 Last data filed at 02/12/15 1408  Gross per 24 hour  Intake   1508 ml  Output   3025 ml  Net  -1517 ml   Filed Weights   02/07/15 2358 02/08/15 0500 02/09/15 0500  Weight: 148.1 kg (326 lb 8 oz) 148.1 kg (326 lb 8 oz) 149.4 kg (329 lb 5.9 oz)    Exam:   General:  Elderly disheveled male in no acute distress  HEENT:  moist oral mucosa, no JVD, supple neck  Cardiovascular: S1 and S2 irregular, no murmurs rub or gallop  Respiratory: Clear to auscultation bilaterally, no added sounds  Abdomen: Soft, nondistended, nontender,  bowel sounds present  Musculoskeletal: Bilateral cellulitis , trace edema on the left (improved), blister over her left tibia  CNS: Alert and oriented  Data Reviewed: Basic Metabolic Panel:  Recent Labs Lab 02/06/15 1400  02/07/15 0508 02/08/15 0530 02/10/15 1043 02/11/15 0401 02/11/15 1939  NA 143 141 142 139 144 140  K 5.4* 5.1 4.7 4.7 4.7 4.9  CL 109 110 111 109 110 108  CO2 GLUCOSE 97 87 89 99 103* 120*  BUN 39* 47* 45* 40* 36* 31*  CREATININE 1.78* 2.10* 1.76* 1.32* 1.12 1.09  CALCIUM 8.5 7.9* 8.1* 8.3* 8.7* 8.5*  MG 2.0  --   --   --   --   --   PHOS 5.5*  --   --   --   --   --    Liver Function Tests:  Recent Labs Lab 02/06/15 1400 02/07/15 0508 02/08/15 0530 02/11/15 0401 02/11/15 1939  AST 490* 218* 163* 66* 54*  ALT 209* 147* 137* 87* 74*  ALKPHOS 147* 106 116 132* 135*  BILITOT 4.4* 2.7* 2.7* 1.9* 2.1*  PROT 7.2 5.9* 6.0 6.6 6.6  ALBUMIN 3.6 3.0* 2.8* 2.9* 2.9*   No results for input(s): LIPASE, AMYLASE in the last 168 hours. No results for input(s): AMMONIA in the last 168 hours. CBC:  Recent Labs Lab 02/05/15 1822 02/06/15 0838 02/07/15 0508 02/08/15 0530 02/10/15 1043 02/11/15 0401  WBC 7.4 8.8 6.8 5.7 5.4 6.3  NEUTROABS 5.9  --   --   --   --   --   HGB 12.6* 11.8* 10.9* 11.1* 11.4* 11.3*  HCT 39.4 37.3* 34.1* 35.1* 35.7* 36.7*  MCV 107.4* 109.1* 108.6* 108.7* 106.9* 107.6*  PLT 140* 145* 129* 143* 155 153   Cardiac Enzymes:  Recent Labs Lab 02/06/15 1502 02/06/15 1933 02/07/15 1756 02/07/15 2320 02/08/15 0530  TROPONINI 2.36* 2.84* 2.31* 1.88* 1.54*   BNP (last 3 results)  Recent Labs  02/06/15 0838  BNP 1119.2*    ProBNP (last 3 results) No results for input(s): PROBNP in the last 8760 hours.  CBG: No results for input(s): GLUCAP in the last 168 hours.  Recent Results (from the past 240 hour(s))  Urine culture     Status: None   Collection Time: 02/06/15 12:27 PM  Result Value Ref Range Status   Specimen Description URINE, CLEAN CATCH  Final   Special Requests NONE  Final   Colony Count   Final    >=100,000 COLONIES/ML Performed at [facility]    Culture   Final    Multiple bacterial morphotypes  present, none predominant. Suggest appropriate recollection if clinically indicated. Performed at [facility]    Report Status 02/07/2015 FINAL  Final  Culture, blood (x 2)     Status: None   Collection Time: 02/06/15  3:01 PM  Result Value Ref Range Status   Specimen Description BLOOD RIGHT HAND  Final   Special Requests BOTTLES DRAWN AEROBIC AND ANAEROBIC 5CC  Final   Culture   Final    VIRIDANS STREPTOCOCCUS STAPHYLOCOCCUS SPECIES (COAGULASE NEGATIVE) Note: THE SIGNIFICANCE OF ISOLATING THIS ORGANISM FROM A SINGLE SET OF BLOOD CULTURES WHEN MULTIPLE SETS ARE DRAWN IS UNCERTAIN. PLEASE NOTIFY THE MICROBIOLOGY DEPARTMENT WITHIN ONE WEEK IF SPECIATION AND SENSITIVITIES ARE REQUIRED. Note: Gram Stain Report Called to,Read Back By and Verified With: KENEISHA RN ON 2W AT 1430 [id number] BY CASTC Performed at [facility]    Report  Status 02/09/2015 FINAL  Final  Culture, blood (x 2)     Status: None   Collection Time: 02/06/15  3:16 PM  Result Value Ref Range Status   Specimen Description BLOOD LEFT ANTECUBITAL  Final   Special Requests BOTTLES DRAWN AEROBIC AND ANAEROBIC 5CC  Final   Culture   Final    NO GROWTH 5 DAYS Performed at [facility]    Report Status 02/12/2015 FINAL  Final  MRSA PCR Screening     Status: Abnormal   Collection Time: 02/07/15 11:15 AM  Result Value Ref Range Status   MRSA by PCR POSITIVE (A) NEGATIVE Final    Comment:        The GeneXpert MRSA Assay (FDA approved for NASAL specimens only), is one component of a comprehensive MRSA colonization surveillance program. It is not intended to diagnose MRSA infection nor to guide or monitor treatment for MRSA infections. RESULT CALLED TO, READ BACK BY AND VERIFIED WITH: GRAHAM,K @ 1627 ON [id number] BY POTEAT,S   Culture, blood (routine x 2)     Status: None (Preliminary result)   Collection Time: 02/10/15 11:55 AM  Result Value Ref Range Status   Specimen Description BLOOD LEFT ARM   10 ML IN [facility] BOTTLE  Final   Special Requests NONE  Final   Culture   Final           BLOOD CULTURE RECEIVED NO GROWTH TO DATE CULTURE WILL BE HELD FOR 5 DAYS BEFORE ISSUING A FINAL NEGATIVE REPORT Performed at [facility]    Report Status PENDING  Incomplete  Culture, blood (routine x 2)     Status: None (Preliminary result)   Collection Time: 02/10/15 12:03 PM  Result Value Ref Range Status   Specimen Description BLOOD RIGHT HAND  10 MLL IN [facility] BOTTLE  Final   Special Requests NONE  Final   Culture   Final           BLOOD CULTURE RECEIVED NO GROWTH TO DATE CULTURE WILL BE HELD FOR 5 DAYS BEFORE ISSUING A FINAL NEGATIVE REPORT Performed at [facility]    Report Status PENDING  Incomplete     Studies: No results found.  Scheduled Meds: . antiseptic oral rinse  7 mL Mouth Rinse BID  . carvedilol  3.125 mg Oral BID WC  . cefTRIAXone (ROCEPHIN)  IV  2 g Intravenous Q24H  . Chlorhexidine Gluconate Cloth  6 each Topical Q0600  . ferrous sulfate  325 mg Oral Daily  . furosemide  40 mg Oral BID  . prednisoLONE acetate  1 drop Right Eye BID  . sodium chloride  3 mL Intravenous Q12H  . timolol  1 drop Right Eye BID  . warfarin  2.5 mg Oral ONCE-1800  . Warfarin - Pharmacist Dosing Inpatient   Does not apply q1800   Continuous Infusions:    Time spent: 25 minutes    Yassmin Binegar  [facility] Pager [phone]. If 7PM-7AM, please contact night-coverage at [email or web address], password [facility] 02/12/2015, 2:16 PM  LOS: 6 days

## 2015-02-13 DIAGNOSIS — A491 Streptococcal infection, unspecified site: Secondary | ICD-10-CM | POA: Diagnosis present

## 2015-02-13 DIAGNOSIS — B954 Other streptococcus as the cause of diseases classified elsewhere: Secondary | ICD-10-CM

## 2015-02-13 LAB — PROTIME-INR
INR: 2.55 — AB (ref 0.00–1.49)
PROTHROMBIN TIME: 27.6 s — AB (ref 11.6–15.2)

## 2015-02-13 MED ORDER — WARFARIN SODIUM 5 MG PO TABS: 5.0000 mg | ORAL_TABLET | Freq: Once | ORAL | Status: DC

## 2015-02-13 MED ORDER — CEPHALEXIN 500 MG PO CAPS: 500.0000 mg | ORAL_CAPSULE | Freq: Four times a day (QID) | ORAL | Status: AC

## 2015-02-13 MED ORDER — SENNOSIDES-DOCUSATE SODIUM 8.6-50 MG PO TABS: 1.0000 | ORAL_TABLET | Freq: Every day | ORAL | Status: DC | PRN

## 2015-02-13 MED ORDER — FUROSEMIDE 40 MG PO TABS: 40.0000 mg | ORAL_TABLET | Freq: Every day | ORAL | Status: DC

## 2015-02-13 MED ORDER — OXYCODONE-ACETAMINOPHEN 5-325 MG PO TABS: 2.0000 | ORAL_TABLET | Freq: Four times a day (QID) | ORAL | Status: DC | PRN

## 2015-02-13 NOTE — Clinical Social Work Placement (Signed)
CSW spoke with Dr. Gonzella Lex who confirmed that patient is now agreeable with plan for SNF - requesting [facility]. CSW confirmed with patient & Wille Celeste at [facility] that they would have a bed for him, awaiting [facility] authorization. Patient's brother, [name] to complete admission paperwork with [facility] today.    Lincoln Maxin, LCSW [facility] Clinical Social Worker cell #: [phone]   CLINICAL SOCIAL WORK PLACEMENT  NOTE  Date:  02/13/2015  Patient Details  Name: [name] MRN: [id number] Date of Birth: Feb 15, 1946  Clinical Social Work is seeking post-discharge placement for this patient at the Skilled  Nursing Facility level of care (*CSW will initial, date and re-position this form in  chart as items are completed):  Yes   Patient/family provided with [facility] Clinical Social Work Department's list of facilities offering this level of care within the geographic area requested by the patient (or if unable, by the patient's family).  Yes   Patient/family informed of their freedom to choose among providers that offer the needed level of care, that participate in Medicare, Medicaid or managed care program needed by the patient, have an available bed and are willing to accept the patient.  Yes   Patient/family informed of [place]'s ownership interest in [facility] and [facility], as well as of the fact that they are under no obligation to receive care at these facilities.  PASRR submitted to EDS on 02/13/15     PASRR number received on 02/13/15     Existing PASRR number confirmed on       FL2 transmitted to all facilities in geographic area requested by pt/family on 02/13/15     FL2 transmitted to all facilities within larger geographic area on       Patient informed that his/her managed care company has contracts with or will negotiate with certain facilities, including the following:        Yes   Patient/family informed of bed  offers received.  Patient chooses bed at [facility]     Physician recommends and patient chooses bed at      Patient to be transferred to [facility] on  .  Patient to be transferred to facility by       Patient family notified on   of transfer.  Name of family member notified:        PHYSICIAN       Additional Comment:    _______________________________________________ Arlyss Repress, LCSW 02/13/2015, 10:26 AM

## 2015-02-13 NOTE — Discharge Summary (Signed)
Physician Discharge Summary  [name] DHR:[id number] DOB: 1945-10-23 DOA: 02/06/2015  PCP: [name], MD  Admit date: 02/06/2015 Discharge date: 02/13/2015  Time spent: 35 minutes  Recommendations for Outpatient Follow-up:  1. Discharged to [facility] skilled nursing facility 2. Patient will be followed by cardiology as outpatient for further cardiac workup 3. He will complete antibiotics for? Viridens Bacteremia on 02/20/2015  Discharge Diagnoses:  Principal Problem: Sepsis due to bilateral leg cellulitis and bacteremia  Active Problems:   Diastolic dysfunction with acute on chronic heart failure     Alcoholic hepatitis   Thrombocytopenia   Streptococcus viridans infection with bacteremia   Essential hypertension   Acute respiratory failure with hypoxia   Atrial fibrillation   Elevated troponin   NSTEMI (non-ST elevated myocardial infarction)   Anemia of chronic disease   Morbid obesity   Discharge Condition: Fair  Diet recommendation: Heart healthy  CODE STATUS: Full code  Discharge weight: 322 pounds   Filed Weights   02/08/15 0500 02/09/15 0500 02/13/15 0557  Weight: 148.1 kg (326 lb 8 oz) 149.4 kg (329 lb 5.9 oz) 146.058 kg (322 lb)    History of present illness:  Please refer to admission H&P for details, in brief, 69 year old male with history of nonischemic artery myopathy, alcoholism, chronic A. fib on Coumadin, multiple falls, obesity, hypertension, history of V. tach presented with fall from bed. As per EMS patient was found to be covered in urine and contrast. On admission patient was hypotensive with low-grade fever, tachycardic with hypoxia. He was briefly confused as well. Blood work done showed hemoglobin of 11.8, potassium of 5.6, creatinine 1.6, BUN of 32, transaminitis with AST of 470 and ALT of 212, alkaline phosphatase of 149, lactic acid of 2.1, BNP of 1100 INR of 2.51 and troponin of 0.83. Patient found to have bilateral leg cellulitis with  hospital course prolonged due to 1/2 blood culture growing strep viridans and coag-negative staph. Patient started on empiric vancomycin and Zosyn. 2-D echo was done which was negative for vegetation or thrombus.  Hospital Course:  Sepsis secondary to bilateral lower leg cellulitis and ?bacteremia Sepsis now resolved. Blood culture on admission growing coag negative staph aureus and viridens Streptococcus on 1/2 culture . Repeat blood culture without any growth. Empiric IV vancomycin and Zosyn switched to IV Rocephin. Could possibly be a contaminant. Discussed with ID Dr. Luciana Axe on the phone who recommends empiric Keflex for cellulitis upon discharge. Will treat for a total of 14 day duration.  NSTEMI Seen by cardiology and recommend this  is likely demand ischemia. Recommend outpatient follow-up with nuclear stress test.  Acute kidney injury Possibly related to sepsis and patient being on Lasix and ARB. Renal function now at baseline.  Patient started on oral Lasix 40 mg twice a day with good diuresis and negative balance.  Hyperkalemia Oral potassium supplements held. Now resolved. Resumed potassium supplement.  Nonischemic cardiomyopathy. Patient having positive balance and gained weight since admission. Resumed Lasix at 40 mg twice a day with good diuresis. Resume ARB. Monitor renal function a.m.  A. fib with RVR  On admission. Now rate controlled. Continue Coreg. On Coumadin with therapeutic INR  Essential hypertension Stable. Continue home medications  Bilateral lower extremity cellulitis.  Wound consult appreciated. Also has blisters on the left posterior lower extremity. Recommend petroleum-based nonadherent dressing and bilateral pressure redistribution boots.  Intra-abdominal skin fold dermatitis Seen by wound care. Recommend bariatric therapeutic Brett Canales surface with low air loss feature to address moisture and  pressure.  Transaminitis due to alcoholic hepatitis LFTs  trended down. Abdominal ultrasound unremarkable. Patient counseled on alcohol cessation  Patient seen by physical therapy and recommended skilled nursing facility.    Code Status: Full code Family Communication: None at bedside Disposition Plan: Skilled nursing facility    Consultants:  Cardiology  Procedures:  2-D echo  Antibiotics: IV vancomycin and Zosyn 4/28-5/1  IV Rocephin since 5/1  Oral Keflex 5/4-5/11  Discharge Exam: Filed Vitals:   02/13/15 0557  BP: 113/65  Pulse: 63  Temp: 98.6 F (37 C)  Resp:      General: Elderly disheveled male in no acute distress  HEENT: moist oral mucosa, no JVD, supple neck  Cardiovascular: S1 and S2 irregular, no murmurs rub or gallop  Respiratory: Clear to auscultation bilaterally, no added sounds  Abdomen: Soft, nondistended, nontender, bowel sounds present  Musculoskeletal: Bilateral cellulitis improved, no edema,, blister over her left calf with dressing  CNS: Alert and oriented  Discharge Instructions    Current Discharge Medication List    START taking these medications   Details  cephALEXin (KEFLEX) 500 MG capsule Take 1 capsule (500 mg total) by mouth 4 (four) times daily. Qty: 28 capsule, Refills: 0    oxyCODONE-acetaminophen (PERCOCET/ROXICET) 5-325 MG per tablet Take 2 tablets by mouth every 6 (six) hours as needed for moderate pain. Qty: 20 tablet, Refills: 0    senna-docusate (SENOKOT-S) 8.6-50 MG per tablet Take 1 tablet by mouth daily as needed for mild constipation or moderate constipation. Qty: 10 tablet, Refills: 0      CONTINUE these medications which have CHANGED   Details  furosemide (LASIX) 40 MG tablet Take 1 tablet (40 mg total) by mouth daily. Qty: 30 tablet, Refills: 0      CONTINUE these medications which have NOT CHANGED   Details  carvedilol (COREG) 6.25 MG tablet Take 6.25 mg by mouth 2 (two) times daily with a meal.    colchicine 0.6 MG tablet Take 0.6 mg by mouth  daily. colcrys    ferrous sulfate 325 (65 FE) MG tablet Take 1 tablet by mouth daily.    neomycin-bacitracin-polymyxin (NEOSPORIN) ointment Apply 1 application topically daily.     olmesartan (BENICAR) 40 MG tablet Take 40 mg by mouth daily.    potassium chloride SA (K-DUR,KLOR-CON) 20 MEQ tablet Take 1 tablet by mouth 2 (two) times daily.    pravastatin (PRAVACHOL) 10 MG tablet Take 10 mg by mouth daily.    prednisoLONE acetate (PRED FORTE) 1 % ophthalmic suspension Place 1 drop into the right eye 2 (two) times daily.    timolol (TIMOPTIC) 0.5 % ophthalmic solution Place 1 drop into the right eye 2 (two) times daily.    vitamin C (ASCORBIC ACID) 250 MG tablet Take 250 mg by mouth 2 (two) times daily.    warfarin (COUMADIN) 5 MG tablet Take 2.5-5 mg by mouth daily with breakfast. Takes a full tablet on MWF and half of a tablet (2.5mg ) on all other days.      STOP taking these medications     HYDROcodone-acetaminophen (NORCO) 5-325 MG per tablet      potassium chloride (K-DUR) 10 MEQ tablet        No Known Allergies Follow-up Information    Please follow up.   Why:  MD at SNF      Follow up with [name], MD.   Specialty:  Cardiology   Why:  Office will call   Contact information:   [address] [place] [address] [phone]        The results of significant diagnostics from this hospitalization (including imaging, microbiology, ancillary and laboratory) are listed below for reference.    Significant Diagnostic Studies: Dg Tibia/fibula Left  02/05/2015   CLINICAL DATA:  Left leg pain and swelling  EXAM: LEFT TIBIA AND FIBULA - 2 VIEW  COMPARISON:  None.  FINDINGS: There is no evidence of fracture or other focal bone lesions. Soft tissues are unremarkable.  IMPRESSION: No acute osseous injury of the left tibia or fibula.   Electronically Signed   By: Elige Ko   On: 02/05/2015 18:54   US Abdomen Complete  02/06/2015   CLINICAL DATA:   Elevated liver function tests.  EXAM: ULTRASOUND ABDOMEN COMPLETE  COMPARISON:  None.  FINDINGS: Gallbladder: A few small, mobile gallstones are identified. There is no gallbladder wall thickening or pericholecystic fluid. Sonographer reports negative Murphy's sign.  Common bile duct: Diameter: 0.4 cm  Liver: No focal lesion identified. Within normal limits in parenchymal echogenicity.  IVC: No abnormality visualized.  Pancreas: Visualized portion unremarkable.  Spleen: Size and appearance within normal limits.  Right Kidney: Length: 10.9 cm. Echogenicity within normal limits. No mass or hydronephrosis visualized.  Left Kidney: Length: 12.3 cm. Echogenicity within normal limits. No mass or hydronephrosis visualized.  Abdominal aorta: No aneurysm visualized.  Other findings: None.  IMPRESSION: A few small gallstones are identified but there is no evidence of acute cholecystitis. The examination is otherwise negative.   Electronically Signed   By: Drusilla Kanner M.D.   On: 02/06/2015 11:45   Dg Chest Port 1 View  02/06/2015   CLINICAL DATA:  Pain following fall  EXAM: PORTABLE CHEST - 1 VIEW  COMPARISON:  April 26, 2013  FINDINGS: There is no edema or consolidation. Heart is enlarged with pulmonary vascularity within normal limits. No adenopathy. No pneumothorax. No bone lesions.  IMPRESSION: Cardiomegaly.  No edema or consolidation.   Electronically Signed   By: Bretta Bang III M.D.   On: 02/06/2015 09:17    Microbiology: Recent Results (from the past 240 hour(s))  Urine culture     Status: None   Collection Time: 02/06/15 12:27 PM  Result Value Ref Range Status   Specimen Description URINE, CLEAN CATCH  Final   Special Requests NONE  Final   Colony Count   Final    >=100,000 COLONIES/ML Performed at [facility]    Culture   Final    Multiple bacterial morphotypes present, none predominant. Suggest appropriate recollection if clinically indicated. Performed at [facility]     Report Status 02/07/2015 FINAL  Final  Culture, blood (x 2)     Status: None   Collection Time: 02/06/15  3:01 PM  Result Value Ref Range Status   Specimen Description BLOOD RIGHT HAND  Final   Special Requests BOTTLES DRAWN AEROBIC AND ANAEROBIC 5CC  Final   Culture   Final    VIRIDANS STREPTOCOCCUS STAPHYLOCOCCUS SPECIES (COAGULASE NEGATIVE) Note: THE SIGNIFICANCE OF ISOLATING THIS ORGANISM FROM A SINGLE SET OF BLOOD CULTURES WHEN MULTIPLE SETS ARE DRAWN IS UNCERTAIN. PLEASE NOTIFY THE MICROBIOLOGY DEPARTMENT WITHIN ONE WEEK IF SPECIATION AND SENSITIVITIES ARE REQUIRED. Note: Gram Stain Report Called to,Read Back By and Verified With: KENEISHA RN ON 2W AT 1430 [id number] BY CASTC Performed at [facility]    Report Status 02/09/2015 FINAL  Final  Culture, blood (x 2)     Status: None  Collection Time: 02/06/15  3:16 PM  Result Value Ref Range Status   Specimen Description BLOOD LEFT ANTECUBITAL  Final   Special Requests BOTTLES DRAWN AEROBIC AND ANAEROBIC 5CC  Final   Culture   Final    NO GROWTH 5 DAYS Performed at [facility]    Report Status 02/12/2015 FINAL  Final  MRSA PCR Screening     Status: Abnormal   Collection Time: 02/07/15 11:15 AM  Result Value Ref Range Status   MRSA by PCR POSITIVE (A) NEGATIVE Final    Comment:        The GeneXpert MRSA Assay (FDA approved for NASAL specimens only), is one component of a comprehensive MRSA colonization surveillance program. It is not intended to diagnose MRSA infection nor to guide or monitor treatment for MRSA infections. RESULT CALLED TO, READ BACK BY AND VERIFIED WITH: GRAHAM,K @ 1627 ON [id number] BY POTEAT,S   Culture, blood (routine x 2)     Status: None (Preliminary result)   Collection Time: 02/10/15 11:55 AM  Result Value Ref Range Status   Specimen Description BLOOD LEFT ARM  10 ML IN [facility] BOTTLE  Final   Special Requests NONE  Final   Culture   Final           BLOOD CULTURE RECEIVED NO GROWTH  TO DATE CULTURE WILL BE HELD FOR 5 DAYS BEFORE ISSUING A FINAL NEGATIVE REPORT Performed at [facility]    Report Status PENDING  Incomplete  Culture, blood (routine x 2)     Status: None (Preliminary result)   Collection Time: 02/10/15 12:03 PM  Result Value Ref Range Status   Specimen Description BLOOD RIGHT HAND  10 MLL IN [facility] BOTTLE  Final   Special Requests NONE  Final   Culture   Final           BLOOD CULTURE RECEIVED NO GROWTH TO DATE CULTURE WILL BE HELD FOR 5 DAYS BEFORE ISSUING A FINAL NEGATIVE REPORT Performed at [facility]    Report Status PENDING  Incomplete     Labs: Basic Metabolic Panel:  Recent Labs Lab 02/06/15 1400  02/08/15 0530 02/10/15 1043 02/11/15 0401 02/11/15 1939 02/12/15 1514  NA 143  < > 142 139 144 140 140  K 5.4*  < > 4.7 4.7 4.7 4.9 4.4  CL 109  < > 111 109 110 108 108  CO2 24  < > GLUCOSE 97  < > 89 99 103* 120* 111*  BUN 39*  < > 45* 40* 36* 31* 27*  CREATININE 1.78*  < > 1.76* 1.32* 1.12 1.09 0.86  CALCIUM 8.5  < > 8.1* 8.3* 8.7* 8.5* 8.6*  MG 2.0  --   --   --   --   --   --   PHOS 5.5*  --   --   --   --   --   --   < > = values in this interval not displayed. Liver Function Tests:  Recent Labs Lab 02/07/15 0508 02/08/15 0530 02/11/15 0401 02/11/15 1939 02/12/15 1514  AST 218* 163* 66* 54* 42*  ALT 147* 137* 87* 74* 61  ALKPHOS 106 116 132* 135* 128*  BILITOT 2.7* 2.7* 1.9* 2.1* 1.8*  PROT 5.9* 6.0 6.6 6.6 6.3*  ALBUMIN 3.0* 2.8* 2.9* 2.9* 2.7*   No results for input(s): LIPASE, AMYLASE in the last 168 hours. No results for input(s): AMMONIA in the last 168  hours. CBC:  Recent Labs Lab 02/07/15 0508 02/08/15 0530 02/10/15 1043 02/11/15 0401  WBC 6.8 5.7 5.4 6.3  HGB 10.9* 11.1* 11.4* 11.3*  HCT 34.1* 35.1* 35.7* 36.7*  MCV 108.6* 108.7* 106.9* 107.6*  PLT 129* 143* 155 153   Cardiac Enzymes:  Recent Labs Lab 02/06/15 1502 02/06/15 1933 02/07/15 1756 02/07/15 2320  02/08/15 0530  TROPONINI 2.36* 2.84* 2.31* 1.88* 1.54*   BNP: BNP (last 3 results)  Recent Labs  02/06/15 0838  BNP 1119.2*    ProBNP (last 3 results) No results for input(s): PROBNP in the last 8760 hours.  CBG: No results for input(s): GLUCAP in the last 168 hours.     SignedEddie North  [facility] 02/13/2015, 10:56 AM

## 2015-02-13 NOTE — Progress Notes (Signed)
ANTICOAGULATION CONSULT NOTE - Follow Up Consult  Pharmacy Consult for Coumadin Indication: atrial fibrillation  No Known Allergies  Patient Measurements: Height: 6' (182.9 cm) Weight: (!) 322 lb (146.058 kg) IBW/kg (Calculated) : 77.6  Vital Signs: Temp: 98.6 F (37 C) (05/04 0557) Temp Source: Oral (05/04 0557) BP: 113/65 mmHg (05/04 0557) Pulse Rate: 63 (05/04 0557)  Labs:  Recent Labs  02/10/15 1043 02/11/15 0401 02/11/15 1939 02/12/15 0515 02/12/15 1514 02/13/15 0526  HGB 11.4* 11.3*  --   --   --   --   HCT 35.7* 36.7*  --   --   --   --   PLT 155 153  --   --   --   --   LABPROT  --  25.5*  --  25.3*  --  27.6*  INR  --  2.30*  --  2.28*  --  2.55*  CREATININE 1.32* 1.12 1.09  --  0.86  --    Estimated Creatinine Clearance: 123.8 mL/min (by C-G formula based on Cr of 0.86).  Medications:  Scheduled:  . antiseptic oral rinse  7 mL Mouth Rinse BID  . carvedilol  3.125 mg Oral BID WC  . cefTRIAXone (ROCEPHIN)  IV  2 g Intravenous Q24H  . ferrous sulfate  325 mg Oral Daily  . furosemide  40 mg Oral BID  . prednisoLONE acetate  1 drop Right Eye BID  . sodium chloride  3 mL Intravenous Q12H  . timolol  1 drop Right Eye BID  . Warfarin - Pharmacist Dosing Inpatient   Does not apply q1800   Assessment: Patient is a 69 y.o M with hx Cardiomyopathy, EtOH abuse, chronic LE swelling, and on coumadin PTA for afib. He presented to the ED 4/27 s/p fall with c/o pain and swelling of LE. Warfarin per pharmacy. Home dose reported as 2.5mg  daily except 5mg  on MWF. INR 4/28 supratherapeutic after 5mg  dose 4/27, no Warfarin from 4/29. INR decreased to to 2.37 on 5/1, so 5mg  was resumed.  Today, 02/13/2015  INR therapeutic   CBC unchanged, low,stable. No bleeding reported/documented.  Reg diet ordered, good po intake  Broad-spectrum abx dc'd 5/1, effects of increased sensitivity to warfarin may persist for a day or two. Ceftriaxone should have minimal effect on  INR.  Goal of Therapy:  INR 2-3   Plan:   Warfarin 5mg  PO x 1 this evening as per home dosing  Anticipate discharge today, recommend continuing previous home warfarin dosing  Daily PT/INR  Juliette Alcide, PharmD, BCPS.   Pager: [phone] 02/13/2015, 9:51 AM

## 2015-02-13 NOTE — Progress Notes (Signed)
CSW met with Pt to discuss his feelings regarding recommendation for SNF placement. Pt reports that he definitely does not want to go to SNF and prefers home health services. He verbalized that the only reason he would chose to go to SNF is if he is unable to walk using a walker. Pt reported that he had a bad experience in another skilled facility and does not want to do that again.  Peri Maris, LCSWA 02/12/15 4:16PM [phone]

## 2015-02-13 NOTE — Progress Notes (Signed)
Subjective: No complaints feeling better  Objective: Vital signs in last 24 hours: Temp:  [98.4 F (36.9 C)-98.7 F (37.1 C)] 98.6 F (37 C) (05/04 0557) Pulse Rate:  [59-70] 63 (05/04 0557) Resp:  [16-18] 18 (05/03 2114) BP: (113-126)/(65-70) 113/65 mmHg (05/04 0557) SpO2:  [94 %-95 %] 94 % (05/04 0557) Weight:  [322 lb (146.058 kg)] 322 lb (146.058 kg) (05/04 0557) Weight change:  Last BM Date: 02/05/15 Intake/Output from previous day: see below 05/03 0701 - 05/04 0700 In: 960 [P.O.:960] Out: 5100 [Urine:5100] Intake/Output this shift:    PE: General:Pleasant affect, NAD Skin:Warm and dry, brisk capillary refill HEENT:normocephalic, sclera clear, mucus membranes moist Heart:irreg irreg without murmur, gallup, rub or click Lungs:clear, ant  without rales, rhonchi, or wheezes UUE:KCMK, non tender, + BS, do not palpate liver spleen or masses LKJ:ZPHXTAVW lower ext edema,  2+ radial pulses Neuro:alert and oriented X 3, MAE, follows commands, + facial symmetry Tele:  A fib with slow response this AM    Lab Results:  Recent Labs  02/11/15 0401  WBC 6.3  HGB 11.3*  HCT 36.7*  PLT 153   BMET  Recent Labs  02/11/15 1939 02/12/15 1514  NA 140 140  K 4.9 4.4  CL 108 108  CO2 29 26  GLUCOSE 120* 111*  BUN 31* 27*  CREATININE 1.09 0.86  CALCIUM 8.5* 8.6*   No results for input(s): TROPONINI in the last 72 hours.  Invalid input(s): CK, MB  Lab Results  Component Value Date   CHOL 79 02/07/2015   HDL 31* 02/07/2015   LDLCALC 37 02/07/2015   TRIG 54 02/07/2015   CHOLHDL 2.5 02/07/2015   Lab Results  Component Value Date   HGBA1C 5.9* 02/06/2015     Lab Results  Component Value Date   TSH 1.163 02/06/2015    Hepatic Function Panel  Recent Labs  02/12/15 1514  PROT 6.3*  ALBUMIN 2.7*  AST 42*  ALT 61  ALKPHOS 128*  BILITOT 1.8*   No results for input(s): CHOL in the last 72 hours. No results for input(s): PROTIME in the last  72 hours.     Studies/Results: No results found.  Medications: I have reviewed the patient's current medications. Scheduled Meds: . antiseptic oral rinse  7 mL Mouth Rinse BID  . cefTRIAXone (ROCEPHIN)  IV  2 g Intravenous Q24H  . ferrous sulfate  325 mg Oral Daily  . furosemide  40 mg Oral BID  . prednisoLONE acetate  1 drop Right Eye BID  . sodium chloride  3 mL Intravenous Q12H  . timolol  1 drop Right Eye BID  . warfarin  5 mg Oral ONCE-1800  . Warfarin - Pharmacist Dosing Inpatient   Does not apply q1800   Continuous Infusions:  PRN Meds:.sodium chloride, LORazepam, morphine injection, ondansetron **OR** ondansetron (ZOFRAN) IV, oxyCODONE-acetaminophen, senna-docusate, sodium chloride   Assessment/Plan: 69 y.o. Obese [name] with a history of CHF secondary to nonischemic cardiomyopathy, alcoholism, chronic a. Fib on coumadin therapy, multiple fall, obesity, hypertension, hx of V. Tach, anemia, hypoxia, GERD, who was consulted for possible cardiac etiology of multiple fall- he stated he tripped, no syncope and no lightheadedness and multiple lab abnormalities.  1. Demand ischemia: Likely related to demand ischemia. Asymptomatic. Troponins down to 1.54 on 4/29 from pk of 2.84. Normal LV systolic function and regional wall motion with grade III diastolic dysfunction. Unable to add beta blockers and ACEI's due to hypotension and  bradycardia. Received IV fluid bolus. Not on statins due to elevated transaminases. Currently on warfarin for atrial fibrillation. No plans for invasive strategy. Plan: consider nuclear stress test, perhaps as outpatient. pt will follow up with Dr. Shela Commons. Binh Doten   2. Chronic diastolic heart failure and chronic right heart failure, with severely reduced RV systolic function: SCr 1.76 on 4/29. (+1916 since admit) Wt 329 on 4/30 up from 323 on admit BP 128/71 to 135/76    -4140 last 24 hours  (-2,224 since admit) wt down 7 lbs since yesterday.  3. Chronic  Atrial fibrillation: Slow ventricular response,  INR therapeutic at 2.30 today. On warfarin. Now on lower dose Coreg ( was on 6.25 BID as outpt. ) resumed at 3.125 mg with brady to 38 this AM,  stop coreg. I have d/c'd     4. Sepsis/lower extremity cellulitis: On vancomycin and Zosyn. Blood cultures P.  5. Chronic anticoagulation CHAD2S2VASC score 2 Today INR 2.55  6. Acute on chronic CKD, now improved with Cr 1.12 -->1.09--> yesterday 0.86  7. Abnormal LFTs improving AST 54, ALT 74      LOS: 7 days   Time spent with pt. :15 minutes. [facility] R  Nurse Practitioner Certified Pager [phone] or after 5pm and on weekends call [phone] 02/13/2015, 11:06 AM   History and all data above reviewed.  Patient examined.  I agree with the findings as above.  The patient exam reveals AVW:[id number]  ,  Lungs: Clear  ,  Abd: Positive bowel sounds, no rebound no guarding, Ext Reduced edema  .  All available labs, radiology testing, previous records reviewed. Agree with documented assessment and plan. Elevated troponin:  I will consider a follow up stress test when I see him in the office.  I have an appt with him in June.   [name]  12:22 PM  02/13/2015

## 2015-02-13 NOTE — Clinical Social Work Placement (Signed)
Patient is set to discharge to [facility] today. Patient & brother, Gene aware. Discharge packet given to RN, Charline Bills. [facility] called for transport to pickup at 2:00pm.    Lincoln Maxin, LCSW [facility] Clinical Social Worker cell #: [phone]    CLINICAL SOCIAL WORK PLACEMENT  NOTE  Date:  02/13/2015  Patient Details  Name: [name] MRN: [id number] Date of Birth: 26-Dec-1945  Clinical Social Work is seeking post-discharge placement for this patient at the Skilled  Nursing Facility level of care (*CSW will initial, date and re-position this form in  chart as items are completed):  Yes   Patient/family provided with [facility] Clinical Social Work Department's list of facilities offering this level of care within the geographic area requested by the patient (or if unable, by the patient's family).  Yes   Patient/family informed of their freedom to choose among providers that offer the needed level of care, that participate in Medicare, Medicaid or managed care program needed by the patient, have an available bed and are willing to accept the patient.  Yes   Patient/family informed of [facility]'s ownership interest in [facility] and [facility], as well as of the fact that they are under no obligation to receive care at these facilities.  PASRR submitted to EDS on 02/13/15     PASRR number received on 02/13/15     Existing PASRR number confirmed on       FL2 transmitted to all facilities in geographic area requested by pt/family on 02/13/15     FL2 transmitted to all facilities within larger geographic area on       Patient informed that his/her managed care company has contracts with or will negotiate with certain facilities, including the following:        Yes   Patient/family informed of bed offers received.  Patient chooses bed at [facility]     Physician recommends and patient chooses bed at      Patient to be  transferred to [facility] on 02/13/15.  Patient to be transferred to facility by [facility]     Patient family notified on 02/13/15 of transfer.  Name of family member notified:  patient's brother, Gene via phone     PHYSICIAN       Additional Comment:    _______________________________________________ Arlyss Repress, LCSW 02/13/2015, 12:46 PM

## 2015-02-13 NOTE — Progress Notes (Signed)
Physical Therapy Treatment Patient Details Name: [name] MRN: [id number] DOB: Oct 12, 1946 Today's Date: 02/13/2015    History of Present Illness 69 y.o. 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 Tach, anemia, hypoxia, GERD admitted with NSTEMI and fall at home with L LE wound/hematoma    PT Comments    Pt continues to require Mod-Max assist +2 for mobility. 3 attempts to get to full standing posture but pt was unable to on today. Will need SNF for continued rehab.   Follow Up Recommendations  SNF;Supervision/Assistance - 24 hour     Equipment Recommendations  None recommended by PT    Recommendations for Other Services       Precautions / Restrictions Precautions Precautions: Fall Restrictions Weight Bearing Restrictions: No    Mobility  Bed Mobility Overal bed mobility: Needs Assistance;+2 for physical assistance;+ 2 for safety/equipment Bed Mobility: Supine to Sit;Sit to Supine     Supine to sit: Mod assist;+2 for physical assistance;+2 for safety/equipment Sit to supine: Max assist;+2 for physical assistance;+2 for safety/equipment   General bed mobility comments: assist for trunk upright and LEs onto bed. Increased time.   Transfers Overall transfer level: Needs assistance Equipment used: Rolling walker (2 wheeled) Transfers: Sit to/from Stand Sit to Stand: From elevated surface;Max assist;+2 physical assistance;+2 safety/equipment         General transfer comment: 3 attemtps to rise but pt only able to clear bottom. Unable to get to full standing posture.   Ambulation/Gait                 Stairs            Wheelchair Mobility    Modified Rankin (Stroke Patients Only)       Balance                                    Cognition Arousal/Alertness: Awake/alert Behavior During Therapy: WFL for tasks  assessed/performed Overall Cognitive Status: Within Functional Limits for tasks assessed                      Exercises General Exercises - Lower Extremity Long Arc Quad: AROM;Both;10 reps;Seated    General Comments        Pertinent Vitals/Pain Pain Assessment: Faces Faces Pain Scale: Hurts little more Pain Location: bil LEs Pain Descriptors / Indicators: Sore Pain Intervention(s): Monitored during session    Home Living                      Prior Function            PT Goals (current goals can now be found in the care plan section) Progress towards PT goals: Progressing toward goals    Frequency  Min 3X/week    PT Plan Current plan remains appropriate    Co-evaluation             End of Session   Activity Tolerance: Patient limited by fatigue;Patient limited by pain Patient left: in bed;with call bell/phone within reach     Time: 1121-1144 PT Time Calculation (min) (ACUTE ONLY): 23 min  Charges:  $Therapeutic Activity: 23-37 mins                    G Codes:  Weston Anna, MPT Pager: [phone]

## 2015-02-16 LAB — CULTURE, BLOOD (ROUTINE X 2)
Culture: NO GROWTH
Culture: NO GROWTH

## 2015-03-28 ENCOUNTER — Ambulatory Visit: Payer: Medicare HMO | Admitting: Cardiology

## 2015-06-24 ENCOUNTER — Ambulatory Visit (INDEPENDENT_AMBULATORY_CARE_PROVIDER_SITE_OTHER): Payer: Medicare HMO | Admitting: Cardiology

## 2015-06-24 ENCOUNTER — Encounter: Payer: Self-pay | Admitting: Cardiology

## 2015-06-24 VITALS — BP 138/76 | HR 54 | Ht 72.0 in | Wt 294.1 lb

## 2015-06-24 DIAGNOSIS — I214 Non-ST elevation (NSTEMI) myocardial infarction: Secondary | ICD-10-CM

## 2015-06-24 NOTE — Patient Instructions (Signed)
Your physician wants you to follow-up in: 6 Months You will receive a reminder letter in the mail two months in advance. If you don't receive a letter, please call our office to schedule the follow-up appointment.  

## 2015-06-24 NOTE — Progress Notes (Signed)
HPI The patient presents for followup of his atrial fibrillation and and cardiomyopathy.  He was in the hospital recently and I reviewed these records. Fact I saw him while he was there. He had sepsis. During his visit he did have elevated cardiac enzymes. However, this was thought to be demand ischemia. He went to rehabilitation after his hospitalization. Since then he has had no acute complaints. He is back at home. He is doing some exercises. Because of his obesity and joint limitations he does ambulate with a cane at times but it feels better than he has previously. He denies any chest pressure, neck or arm discomfort. He has no acute shortness of breath, PND or orthopnea. Has no palpitations, presyncope or syncope. He has no weight gain or edema. He doesn't feel his fibrillation.   No Known Allergies  Current Outpatient Prescriptions  Medication Sig Dispense Refill  . carvedilol (COREG) 6.25 MG tablet Take 6.25 mg by mouth 2 (two) times daily with a meal.    . colchicine 0.6 MG tablet Take 0.6 mg by mouth daily. colcrys    . ferrous sulfate 325 (65 FE) MG tablet Take 1 tablet by mouth daily.    . furosemide (LASIX) 40 MG tablet Take 1 tablet (40 mg total) by mouth daily. 30 tablet 0  . olmesartan (BENICAR) 40 MG tablet Take 40 mg by mouth daily.    . potassium chloride SA (K-DUR,KLOR-CON) 20 MEQ tablet Take 1 tablet by mouth 2 (two) times daily.    . pravastatin (PRAVACHOL) 10 MG tablet Take 10 mg by mouth daily.    . vitamin C (ASCORBIC ACID) 250 MG tablet Take 250 mg by mouth 2 (two) times daily.    Marland Kitchen warfarin (COUMADIN) 5 MG tablet Take 2.5-5 mg by mouth daily with breakfast. Takes a full tablet on MWF and half of a tablet (2.5mg ) on all other days.     No current facility-administered medications for this visit.    Past Medical History  Diagnosis Date  . Cardiomyopathy     nonischemic  . CHF (congestive heart failure)     ejection fractio of 45% per echo in 2011 (previos EF  35-40% in 2003), echo 04/2013  -LVEF 50-55%.  . Alcoholism   . Atrial fibrillation or flutter   . Hypertension   . Obesity   . Ventricular tachycardia     nonsustained, asymptomatic  . Anemia   . Hypoxemia   . GERD (gastroesophageal reflux disease)     takes tums prn  . Complication of anesthesia     April 11, 2013    Past Surgical History  Procedure Laterality Date  . Tonsillectomy    . Knee bursectomy Right 04/20/2013    Procedure: Right knee prepatella bursa decompression;  Surgeon: Cammy Copa, MD;  Location: [facility] OR;  Service: Orthopedics;  Laterality: Right;  . I&d extremity Right 06/28/2013    Procedure: IRRIGATION AND DEBRIDEMENT OF RIGHT LEG WITH SURGICAL PREP/PLACEMENT OF ACELL ;  Surgeon: Wayland Denis, DO;  Location: [facility] OR;  Service: Plastics;  Laterality: Right;    ROS:  As stated in the HPI and negative for all other systems.  PHYSICAL EXAM BP 138/76 mmHg  Pulse 54  Ht 6' (1.829 m)  Wt 294 lb 1.6 oz (133.403 kg)  BMI 39.88 kg/m2 GENERAL:  Well appearing HEENT:  Pupils equal round and reactive, fundi not visualized, oral mucosa unremarkable, disconjugate gaze NECK:  No jugular venous distention, waveform within normal limits, carotid  upstroke brisk and symmetric, no bruits, no thyromegaly LYMPHATICS:  No cervical, inguinal adenopathy LUNGS:  Clear to auscultation bilaterally BACK:  No CVA tenderness CHEST:  Unremarkable HEART:  PMI not displaced or sustained,S1 and S2 within normal limits, no S3,  no clicks, no rubs, no murmurs, irregular ABD:  Flat, positive bowel sounds normal in frequency in pitch, no bruits, no rebound, no guarding, no midline pulsatile mass, no hepatomegaly, no splenomegaly, obese EXT:  2 plus pulses throughout, no edema, no cyanosis no clubbing.  The right leg is wrapped.   ASSESSMENT AND PLAN  NSTEMI - The patient did have elevated enzymes in the hospital. However, this was doubly demand ischemia. He has no symptoms. At this point I  don't think that further ischemia evaluation is indicated.   ATRIAL FIBRILLATION - The patient  tolerates this rhythm and rate control and anticoagulation. We will continue with the meds as listed.  He is doing well on warfarin and so I will not change this.  CHF -  Mr. Cotton Poirot seems to be euvolemic. At this point, no change in therapy is indicated. His last EF ws 50%.  HYPERTENSION -  The blood pressure is at target. No change in medications is indicated. We will continue with therapeutic lifestyle changes (TLC).  Hospital records reviewed.

## 2015-10-14 ENCOUNTER — Emergency Department (HOSPITAL_COMMUNITY): Payer: Medicare HMO

## 2015-10-14 ENCOUNTER — Encounter (HOSPITAL_COMMUNITY): Payer: Self-pay | Admitting: *Deleted

## 2015-10-14 ENCOUNTER — Emergency Department (HOSPITAL_COMMUNITY)
Admission: EM | Admit: 2015-10-14 | Discharge: 2015-10-14 | Disposition: A | Discharge status: Home / Self Care | Payer: Medicare HMO | Attending: Emergency Medicine | Admitting: Emergency Medicine

## 2015-10-14 DIAGNOSIS — Y998 Other external cause status: Secondary | ICD-10-CM | POA: Insufficient documentation

## 2015-10-14 DIAGNOSIS — W06XXXA Fall from bed, initial encounter: Secondary | ICD-10-CM | POA: Insufficient documentation

## 2015-10-14 DIAGNOSIS — R600 Localized edema: Secondary | ICD-10-CM | POA: Diagnosis not present

## 2015-10-14 DIAGNOSIS — X31XXXA Exposure to excessive natural cold, initial encounter: Secondary | ICD-10-CM | POA: Diagnosis not present

## 2015-10-14 DIAGNOSIS — T68XXXA Hypothermia, initial encounter: Secondary | ICD-10-CM | POA: Diagnosis not present

## 2015-10-14 DIAGNOSIS — W19XXXA Unspecified fall, initial encounter: Secondary | ICD-10-CM

## 2015-10-14 DIAGNOSIS — R1900 Intra-abdominal and pelvic swelling, mass and lump, unspecified site: Secondary | ICD-10-CM | POA: Insufficient documentation

## 2015-10-14 DIAGNOSIS — Y9389 Activity, other specified: Secondary | ICD-10-CM | POA: Diagnosis not present

## 2015-10-14 DIAGNOSIS — I509 Heart failure, unspecified: Secondary | ICD-10-CM | POA: Diagnosis not present

## 2015-10-14 DIAGNOSIS — Z87891 Personal history of nicotine dependence: Secondary | ICD-10-CM | POA: Diagnosis not present

## 2015-10-14 DIAGNOSIS — I10 Essential (primary) hypertension: Secondary | ICD-10-CM | POA: Insufficient documentation

## 2015-10-14 DIAGNOSIS — Y9289 Other specified places as the place of occurrence of the external cause: Secondary | ICD-10-CM | POA: Diagnosis not present

## 2015-10-14 DIAGNOSIS — Z7901 Long term (current) use of anticoagulants: Secondary | ICD-10-CM | POA: Insufficient documentation

## 2015-10-14 DIAGNOSIS — R001 Bradycardia, unspecified: Secondary | ICD-10-CM | POA: Diagnosis not present

## 2015-10-14 DIAGNOSIS — K219 Gastro-esophageal reflux disease without esophagitis: Secondary | ICD-10-CM | POA: Insufficient documentation

## 2015-10-14 DIAGNOSIS — N5089 Other specified disorders of the male genital organs: Secondary | ICD-10-CM | POA: Diagnosis not present

## 2015-10-14 DIAGNOSIS — E669 Obesity, unspecified: Secondary | ICD-10-CM | POA: Insufficient documentation

## 2015-10-14 DIAGNOSIS — D649 Anemia, unspecified: Secondary | ICD-10-CM | POA: Diagnosis not present

## 2015-10-14 DIAGNOSIS — Z79899 Other long term (current) drug therapy: Secondary | ICD-10-CM | POA: Diagnosis not present

## 2015-10-14 DIAGNOSIS — S59912A Unspecified injury of left forearm, initial encounter: Secondary | ICD-10-CM | POA: Diagnosis present

## 2015-10-14 LAB — URINE MICROSCOPIC-ADD ON

## 2015-10-14 LAB — URINALYSIS, ROUTINE W REFLEX MICROSCOPIC
Bilirubin Urine: NEGATIVE
Glucose, UA: NEGATIVE mg/dL
HGB URINE DIPSTICK: NEGATIVE
KETONES UR: NEGATIVE mg/dL
NITRITE: NEGATIVE
Protein, ur: NEGATIVE mg/dL
Specific Gravity, Urine: 1.02 (ref 1.005–1.030)
pH: 5 (ref 5.0–8.0)

## 2015-10-14 LAB — CBC WITH DIFFERENTIAL/PLATELET
Basophils Absolute: 0 10*3/uL (ref 0.0–0.1)
Basophils Relative: 0 %
Eosinophils Absolute: 0.1 10*3/uL (ref 0.0–0.7)
Eosinophils Relative: 2 %
HCT: 36.3 % — ABNORMAL LOW (ref 39.0–52.0)
HEMOGLOBIN: 11.7 g/dL — AB (ref 13.0–17.0)
LYMPHS ABS: 1 10*3/uL (ref 0.7–4.0)
LYMPHS PCT: 27 %
MCH: 33 pg (ref 26.0–34.0)
MCHC: 32.2 g/dL (ref 30.0–36.0)
MCV: 102.3 fL — AB (ref 78.0–100.0)
Monocytes Absolute: 0.4 10*3/uL (ref 0.1–1.0)
Monocytes Relative: 11 %
NEUTROS PCT: 60 %
Neutro Abs: 2.3 10*3/uL (ref 1.7–7.7)
Platelets: 127 10*3/uL — ABNORMAL LOW (ref 150–400)
RBC: 3.55 MIL/uL — AB (ref 4.22–5.81)
RDW: 15.3 % (ref 11.5–15.5)
WBC: 3.8 10*3/uL — AB (ref 4.0–10.5)

## 2015-10-14 LAB — COMPREHENSIVE METABOLIC PANEL
ALT: 13 U/L — AB (ref 17–63)
AST: 28 U/L (ref 15–41)
Albumin: 3.3 g/dL — ABNORMAL LOW (ref 3.5–5.0)
Alkaline Phosphatase: 121 U/L (ref 38–126)
Anion gap: 13 (ref 5–15)
BUN: 29 mg/dL — ABNORMAL HIGH (ref 6–20)
CO2: 19 mmol/L — AB (ref 22–32)
CREATININE: 1.09 mg/dL (ref 0.61–1.24)
Calcium: 8.9 mg/dL (ref 8.9–10.3)
Chloride: 112 mmol/L — ABNORMAL HIGH (ref 101–111)
Glucose, Bld: 78 mg/dL (ref 65–99)
Potassium: 5.2 mmol/L — ABNORMAL HIGH (ref 3.5–5.1)
Sodium: 144 mmol/L (ref 135–145)
Total Bilirubin: 1.6 mg/dL — ABNORMAL HIGH (ref 0.3–1.2)
Total Protein: 6.9 g/dL (ref 6.5–8.1)

## 2015-10-14 LAB — I-STAT TROPONIN, ED: Troponin i, poc: 0.02 ng/mL (ref 0.00–0.08)

## 2015-10-14 MED ORDER — SODIUM CHLORIDE 0.9 % IV BOLUS (SEPSIS)
1000.0000 mL | Freq: Once | INTRAVENOUS | Status: AC
  Administered 2015-10-14: 1000 mL via INTRAVENOUS

## 2015-10-14 NOTE — ED Notes (Signed)
Ptar called 

## 2015-10-14 NOTE — ED Notes (Signed)
PTAR at bedside 

## 2015-10-14 NOTE — ED Notes (Signed)
Pt ambulatory with a walker without difficulty. Steady gait noted.

## 2015-10-14 NOTE — ED Notes (Addendum)
Pt to ED from home by EMS c/o initially of a fall. Pt reports his legs slid out from under him and he fell to the floor. Pt ambulates with a walker. EMS noted HR of 48 and BP 100/60. Redness noted to bilateral legs, pt reports edema is at baseline. Redness noted to lower abdomen without warmth present. Skin tear to L arm Pt denies any complaints at this time. Pt lives at home and takes care of his father.

## 2015-10-14 NOTE — ED Provider Notes (Signed)
CSN: [id number]     Arrival date & time 10/14/15  0125 History   By signing my name below, I, [name], attest that this documentation has been prepared under the direction and in the presence of Richard Plan, DO.  Electronically Signed: Arlan [name], ED Scribe. 10/14/2015. 2:04 AM.   Chief Complaint  Patient presents with  . Bradycardia  . Fall   The history is provided by the patient. No language interpreter was used.    HPI Comments: [name] brought in by EMS is a 70 y.o. male with a PMHx of CHF, cardiomyopathy, A-Fib, HTN, and recurrent falls who presents to the Emergency Department here after a fall sustained just prior to arrival. Pt states his slide out of his bed onto the floor resulting in a wound to the L forearm. Pt states he was unable to get back up and called 911. No head trauma or LOC. He denies any pain at this time. No recent fever, chills, nausea, vomiting, chest pain, or shortness of breath. [name] uses a walker to ambulate at baseline.  PCP: Richard Grays, MD   CARDIOLOGY: Richard Rotunda, MD  Past Medical History  Diagnosis Date  . Cardiomyopathy     nonischemic  . CHF (congestive heart failure) (HCC)     ejection fractio of 45% per echo in 2011 (previos EF 35-40% in 2003), echo 04/2013  -LVEF 50-55%.  . Alcoholism (HCC)   . Atrial fibrillation or flutter   . Hypertension   . Obesity   . Ventricular tachycardia (HCC)     nonsustained, asymptomatic  . Anemia   . Hypoxemia   . GERD (gastroesophageal reflux disease)     takes tums prn  . Complication of anesthesia     April 11, 2013   Past Surgical History  Procedure Laterality Date  . Tonsillectomy    . Knee bursectomy Right 04/20/2013    Procedure: Right knee prepatella bursa decompression;  Surgeon: Richard Copa, MD;  Location: [facility] OR;  Service: Orthopedics;  Laterality: Right;  . I&d extremity Right 06/28/2013    Procedure: IRRIGATION AND DEBRIDEMENT OF RIGHT LEG WITH SURGICAL PREP/PLACEMENT  OF ACELL ;  Surgeon: Richard Denis, DO;  Location: [facility] OR;  Service: Plastics;  Laterality: Right;   Family History  Problem Relation Age of Onset  . COPD Mother   . Hypertension Father    Social History  Substance Use Topics  . Smoking status: Former Smoker    Start date: 06/29/1987  . Smokeless tobacco: Never Used     Comment: stopped in 1985  . Alcohol Use: 6.0 oz/week    10 Cans of beer per week    Review of Systems  Constitutional: Negative for fever and chills.  HENT: Negative for congestion and facial swelling.   Eyes: Negative for discharge and visual disturbance.  Respiratory: Negative for shortness of breath.   Cardiovascular: Positive for leg swelling. Negative for chest pain and palpitations.  Gastrointestinal: Negative for vomiting, abdominal pain and diarrhea.  Musculoskeletal: Negative for myalgias and arthralgias.  Skin: Positive for wound. Negative for color change and rash.  Neurological: Negative for tremors, syncope and headaches.  Psychiatric/Behavioral: Negative for confusion and dysphoric mood.      Allergies  Review of patient's allergies indicates no known allergies.  Home Medications   Prior to Admission medications   Medication Sig Start Date End Date Taking? Authorizing Provider  carvedilol (COREG) 6.25 MG tablet Take 6.25 mg by mouth 2 (two) times  daily with a meal.   Yes Historical Provider, MD  colchicine 0.6 MG tablet Take 0.6 mg by mouth daily. colcrys   Yes Historical Provider, MD  Cyanocobalamin (VITAMIN B-12 PO) Take 1 tablet by mouth daily.   Yes Historical Provider, MD  ferrous sulfate 325 (65 FE) MG tablet Take 1 tablet by mouth daily.   Yes Historical Provider, MD  furosemide (LASIX) 40 MG tablet Take 1 tablet (40 mg total) by mouth daily. 02/13/15  Yes Richard Dhungel, MD  olmesartan (BENICAR) 40 MG tablet Take 40 mg by mouth daily.   Yes Historical Provider, MD  potassium chloride SA (K-DUR,KLOR-CON) 20 MEQ tablet Take 1 tablet by  mouth 2 (two) times daily. 01/08/15  Yes Historical Provider, MD  pravastatin (PRAVACHOL) 10 MG tablet Take 10 mg by mouth daily.   Yes Historical Provider, MD  vitamin C (ASCORBIC ACID) 500 MG tablet Take 500 mg by mouth daily.   Yes Historical Provider, MD  warfarin (COUMADIN) 5 MG tablet Take 2.5-5 mg by mouth daily with breakfast. Takes a full tablet on MWF and half of a tablet (2.5mg ) on all other days. 11/15/14  Yes Historical Provider, MD   Triage Vitals: BP 118/76 mmHg  Pulse 81  Temp(Src) 96.2 F (35.7 C) (Rectal)  Resp 15  Ht 6' (1.829 m)  Wt 315 lb (142.883 kg)  BMI 42.71 kg/m2  SpO2 98%   Physical Exam  Constitutional: He is oriented to person, place, and time. He appears well-developed and well-nourished.  Chronically ill appearing  HENT:  Head: Normocephalic and atraumatic.  Eyes: EOM are normal. Pupils are equal, round, and reactive to light.  Disconjugate gaze   Neck: Normal range of motion. Neck supple. No JVD present.  Cardiovascular: Normal rate and regular rhythm.  Exam reveals no gallop and no friction rub.   No murmur heard. Pulmonary/Chest: No respiratory distress. He has no wheezes.  Abdominal: He exhibits no distension. There is no rebound and no guarding.  Abdominal wall swelling and erythema; unchanged  Genitourinary:  Testicular swelling; unchanged  Musculoskeletal: Normal range of motion. He exhibits edema.  Chronic lower extremity edema with bilateral skin changes  Neurological: He is alert and oriented to person, place, and time.  Skin: No rash noted. No pallor.  Psychiatric: He has a normal mood and affect. His behavior is normal.  Nursing note and vitals reviewed.   ED Course  Procedures (including critical care time)  DIAGNOSTIC STUDIES: Oxygen Saturation is 97% on RA, adequate by my interpretation.    COORDINATION OF CARE: 1:57 AM- Will give fluids. Will order CXR, i-stat troponin, CBC, CMP, urinalysis, and EKG. Discussed treatment [name]  with pt at bedside and pt agreed to [name].     Labs Review Labs Reviewed  CBC WITH DIFFERENTIAL/PLATELET - Abnormal; Notable for the following:    WBC 3.8 (*)    RBC 3.55 (*)    Hemoglobin 11.7 (*)    HCT 36.3 (*)    MCV 102.3 (*)    Platelets 127 (*)    All other components within normal limits  COMPREHENSIVE METABOLIC PANEL - Abnormal; Notable for the following:    Potassium 5.2 (*)    Chloride 112 (*)    CO2 19 (*)    BUN 29 (*)    Albumin 3.3 (*)    ALT 13 (*)    Total Bilirubin 1.6 (*)    All other components within normal limits  URINALYSIS, ROUTINE W REFLEX MICROSCOPIC (NOT AT [facility]) -  Abnormal; Notable for the following:    Color, Urine AMBER (*)    APPearance CLOUDY (*)    Leukocytes, UA TRACE (*)    All other components within normal limits  URINE MICROSCOPIC-ADD ON - Abnormal; Notable for the following:    Squamous Epithelial / LPF 0-5 (*)    Bacteria, UA RARE (*)    Casts HYALINE CASTS (*)    All other components within normal limits  Rosezena Sensor, ED    Imaging Review Dg Chest Port 1 View  10/14/2015  CLINICAL DATA:  Weakness. EXAM: PORTABLE CHEST 1 VIEW COMPARISON:  02/06/2015 FINDINGS: Cardiomegaly is unchanged. There is atherosclerosis of the thoracic aorta. Question a vascular congestion without pulmonary edema. No consolidation, large pleural effusion or pneumothorax. No acute osseous abnormalities are seen. IMPRESSION: Stable cardiomegaly. Question of vascular congestion, no pulmonary edema. Electronically Signed   By: Rubye Oaks M.D.   On: 10/14/2015 02:59   I have personally reviewed and evaluated these images and lab results as part of my medical decision-making.   EKG Interpretation   Date/Time:  Monday October 14 2015 01:36:06 EST Ventricular Rate:  57 PR Interval:    QRS Duration: 165 QT Interval:  538 QTC Calculation: 524 R Axis:   176 Text Interpretation:  Atrial fibrillation RBBB and LPFB Anterior infarct,  old no distinct p waves  Junctional bradycardia Confirmed by Adela Lank MD,  Reuel Boom ([id number]) on 10/14/2015 1:55:23 AM      MDM   Final diagnoses:  Fall, initial encounter    70 yo M with weakness.  Tried to get up from bed and couldn't, slid to the floor without injury.  Patient was unable to get up so called EMS.  Found to be bradycardic and with soft blood pressures.  On my exam patient without complaints.  Mild hypothermia.  CXR, ua negative for infection.  ECG with no significant changes from prior.  Patient able to ambulate and is at his baseline.  Discussed inpatient vs outpatient management, elects for DC home.  Will have him call his cardiologist in the morning and discuss the possibility of changing his home bp and rate control medications.   I have discussed the diagnosis/risks/treatment options with the patient and believe the pt to be eligible for discharge home to follow-up with PCP/cards. We also discussed returning to the ED immediately if new or worsening sx occur. We discussed the sx which are most concerning (e.g., sudden worsening weakness, fever) that necessitate immediate return. Medications administered to the patient during their visit and any new prescriptions provided to the patient are listed below.  Medications given during this visit Medications  sodium chloride 0.9 % bolus 1,000 mL (0 mLs Intravenous Stopped 10/14/15 0624)    Discharge Medication List as of 10/14/2015  6:38 AM      The patient appears reasonably screen and/or stabilized for discharge and I doubt any other medical condition or other [facility] requiring further screening, evaluation, or treatment in the ED at this time prior to discharge.    I personally performed the services described in this documentation, which was scribed in my presence. The recorded information has been reviewed and is accurate.     Richard Plan, DO 10/14/15 1745

## 2015-10-14 NOTE — ED Notes (Signed)
X-ray at bedside

## 2015-10-20 ENCOUNTER — Observation Stay (HOSPITAL_COMMUNITY)
Admission: EM | Admit: 2015-10-20 | Discharge: 2015-10-25 | Disposition: A | Discharge status: Skilled Nursing Facility | Payer: Medicare HMO | Attending: Internal Medicine | Admitting: Internal Medicine

## 2015-10-20 ENCOUNTER — Emergency Department (HOSPITAL_COMMUNITY): Payer: Medicare HMO

## 2015-10-20 ENCOUNTER — Encounter (HOSPITAL_COMMUNITY): Payer: Self-pay | Admitting: Emergency Medicine

## 2015-10-20 DIAGNOSIS — I4892 Unspecified atrial flutter: Secondary | ICD-10-CM | POA: Diagnosis not present

## 2015-10-20 DIAGNOSIS — I428 Other cardiomyopathies: Secondary | ICD-10-CM | POA: Diagnosis not present

## 2015-10-20 DIAGNOSIS — I5033 Acute on chronic diastolic (congestive) heart failure: Secondary | ICD-10-CM | POA: Insufficient documentation

## 2015-10-20 DIAGNOSIS — R296 Repeated falls: Secondary | ICD-10-CM | POA: Diagnosis not present

## 2015-10-20 DIAGNOSIS — W1830XA Fall on same level, unspecified, initial encounter: Secondary | ICD-10-CM | POA: Insufficient documentation

## 2015-10-20 DIAGNOSIS — E875 Hyperkalemia: Secondary | ICD-10-CM | POA: Insufficient documentation

## 2015-10-20 DIAGNOSIS — I482 Chronic atrial fibrillation, unspecified: Secondary | ICD-10-CM | POA: Diagnosis present

## 2015-10-20 DIAGNOSIS — I110 Hypertensive heart disease with heart failure: Secondary | ICD-10-CM | POA: Insufficient documentation

## 2015-10-20 DIAGNOSIS — Z8679 Personal history of other diseases of the circulatory system: Secondary | ICD-10-CM

## 2015-10-20 DIAGNOSIS — S7001XA Contusion of right hip, initial encounter: Secondary | ICD-10-CM | POA: Diagnosis not present

## 2015-10-20 DIAGNOSIS — Z602 Problems related to living alone: Secondary | ICD-10-CM | POA: Diagnosis not present

## 2015-10-20 DIAGNOSIS — W19XXXA Unspecified fall, initial encounter: Secondary | ICD-10-CM

## 2015-10-20 DIAGNOSIS — I452 Bifascicular block: Secondary | ICD-10-CM | POA: Diagnosis not present

## 2015-10-20 DIAGNOSIS — Z79899 Other long term (current) drug therapy: Secondary | ICD-10-CM | POA: Diagnosis not present

## 2015-10-20 DIAGNOSIS — Z87891 Personal history of nicotine dependence: Secondary | ICD-10-CM | POA: Insufficient documentation

## 2015-10-20 DIAGNOSIS — Z7901 Long term (current) use of anticoagulants: Secondary | ICD-10-CM | POA: Insufficient documentation

## 2015-10-20 DIAGNOSIS — Z66 Do not resuscitate: Secondary | ICD-10-CM | POA: Diagnosis not present

## 2015-10-20 DIAGNOSIS — R17 Unspecified jaundice: Secondary | ICD-10-CM | POA: Diagnosis not present

## 2015-10-20 DIAGNOSIS — I480 Paroxysmal atrial fibrillation: Secondary | ICD-10-CM | POA: Diagnosis not present

## 2015-10-20 DIAGNOSIS — I10 Essential (primary) hypertension: Secondary | ICD-10-CM | POA: Diagnosis present

## 2015-10-20 DIAGNOSIS — I252 Old myocardial infarction: Secondary | ICD-10-CM | POA: Insufficient documentation

## 2015-10-20 DIAGNOSIS — K219 Gastro-esophageal reflux disease without esophagitis: Secondary | ICD-10-CM | POA: Insufficient documentation

## 2015-10-20 DIAGNOSIS — E785 Hyperlipidemia, unspecified: Secondary | ICD-10-CM | POA: Diagnosis not present

## 2015-10-20 DIAGNOSIS — Z6841 Body Mass Index (BMI) 40.0 and over, adult: Secondary | ICD-10-CM | POA: Insufficient documentation

## 2015-10-20 DIAGNOSIS — N179 Acute kidney failure, unspecified: Secondary | ICD-10-CM | POA: Diagnosis not present

## 2015-10-20 DIAGNOSIS — F1020 Alcohol dependence, uncomplicated: Secondary | ICD-10-CM | POA: Insufficient documentation

## 2015-10-20 DIAGNOSIS — S7002XA Contusion of left hip, initial encounter: Secondary | ICD-10-CM | POA: Insufficient documentation

## 2015-10-20 DIAGNOSIS — Y92000 Kitchen of unspecified non-institutional (private) residence as  the place of occurrence of the external cause: Secondary | ICD-10-CM | POA: Diagnosis not present

## 2015-10-20 DIAGNOSIS — R5381 Other malaise: Secondary | ICD-10-CM | POA: Diagnosis present

## 2015-10-20 DIAGNOSIS — D61818 Other pancytopenia: Secondary | ICD-10-CM | POA: Insufficient documentation

## 2015-10-20 LAB — CBC WITH DIFFERENTIAL/PLATELET
Basophils Absolute: 0 10*3/uL (ref 0.0–0.1)
Basophils Relative: 0 %
Eosinophils Absolute: 0 10*3/uL (ref 0.0–0.7)
Eosinophils Relative: 0 %
HCT: 42.9 % (ref 39.0–52.0)
Hemoglobin: 13.6 g/dL (ref 13.0–17.0)
Lymphocytes Relative: 11 %
Lymphs Abs: 0.6 10*3/uL — ABNORMAL LOW (ref 0.7–4.0)
MCH: 33.3 pg (ref 26.0–34.0)
MCHC: 31.7 g/dL (ref 30.0–36.0)
MCV: 105.1 fL — ABNORMAL HIGH (ref 78.0–100.0)
Monocytes Absolute: 0.4 10*3/uL (ref 0.1–1.0)
Monocytes Relative: 8 %
Neutro Abs: 4.4 10*3/uL (ref 1.7–7.7)
Neutrophils Relative %: 81 %
Platelets: 139 10*3/uL — ABNORMAL LOW (ref 150–400)
RBC: 4.08 MIL/uL — ABNORMAL LOW (ref 4.22–5.81)
RDW: 15.8 % — ABNORMAL HIGH (ref 11.5–15.5)
WBC: 5.4 10*3/uL (ref 4.0–10.5)

## 2015-10-20 LAB — COMPREHENSIVE METABOLIC PANEL
ALT: 20 U/L (ref 17–63)
AST: 40 U/L (ref 15–41)
Albumin: 3.8 g/dL (ref 3.5–5.0)
Alkaline Phosphatase: 153 U/L — ABNORMAL HIGH (ref 38–126)
Anion gap: 15 (ref 5–15)
BUN: 27 mg/dL — ABNORMAL HIGH (ref 6–20)
CO2: 20 mmol/L — ABNORMAL LOW (ref 22–32)
Calcium: 9.1 mg/dL (ref 8.9–10.3)
Chloride: 108 mmol/L (ref 101–111)
Creatinine, Ser: 1.18 mg/dL (ref 0.61–1.24)
GFR calc Af Amer: >60 mL/min (ref >60)
GFR calc non Af Amer: >60 mL/min (ref >60)
Glucose, Bld: 71 mg/dL (ref 65–99)
Potassium: 5.6 mmol/L — ABNORMAL HIGH (ref 3.5–5.1)
Sodium: 143 mmol/L (ref 135–145)
Total Bilirubin: 2.3 mg/dL — ABNORMAL HIGH (ref 0.3–1.2)
Total Protein: 7.7 g/dL (ref 6.5–8.1)

## 2015-10-20 LAB — CK: Total CK: 96 U/L (ref 49–397)

## 2015-10-20 LAB — PROTIME-INR
INR: 6.61 — AB (ref 0.00–1.49)
PROTHROMBIN TIME: 55.5 s — AB (ref 11.6–15.2)

## 2015-10-20 MED ORDER — ACETAMINOPHEN 325 MG PO TABS
650.0000 mg | ORAL_TABLET | Freq: Four times a day (QID) | ORAL | Status: DC | PRN
  Administered 2015-10-20 – 2015-10-21 (×2): 650 mg via ORAL
  Filled 2015-10-20 (×2): qty 2

## 2015-10-20 MED ORDER — COLCHICINE 0.6 MG PO TABS
0.6000 mg | ORAL_TABLET | Freq: Every day | ORAL | Status: DC
  Administered 2015-10-20 – 2015-10-24 (×5): 0.6 mg via ORAL
  Filled 2015-10-20 (×5): qty 1

## 2015-10-20 MED ORDER — FUROSEMIDE 10 MG/ML IJ SOLN
40.0000 mg | Freq: Two times a day (BID) | INTRAMUSCULAR | Status: DC
  Administered 2015-10-21 (×2): 40 mg via INTRAVENOUS
  Filled 2015-10-20 (×3): qty 4

## 2015-10-20 MED ORDER — COLCHICINE 0.6 MG PO TABS: 0.6000 mg | ORAL_TABLET | Freq: Every day | ORAL | Status: DC

## 2015-10-20 MED ORDER — PRAVASTATIN SODIUM 20 MG PO TABS
10.0000 mg | ORAL_TABLET | Freq: Every day | ORAL | Status: DC
  Administered 2015-10-20 – 2015-10-24 (×5): 10 mg via ORAL
  Filled 2015-10-20 (×7): qty 1

## 2015-10-20 MED ORDER — SODIUM CHLORIDE 0.9 % IV SOLN: Freq: Once | INTRAVENOUS | Status: DC

## 2015-10-20 MED ORDER — SODIUM CHLORIDE 0.9 % IV SOLN
INTRAVENOUS | Status: DC
  Administered 2015-10-20: 18:00:00 via INTRAVENOUS

## 2015-10-20 MED ORDER — CARVEDILOL 6.25 MG PO TABS
6.2500 mg | ORAL_TABLET | Freq: Two times a day (BID) | ORAL | Status: DC
  Administered 2015-10-20 – 2015-10-23 (×6): 6.25 mg via ORAL
  Filled 2015-10-20 (×6): qty 1

## 2015-10-20 MED ORDER — FUROSEMIDE 10 MG/ML IJ SOLN
40.0000 mg | Freq: Two times a day (BID) | INTRAMUSCULAR | Status: DC
  Administered 2015-10-20 – 2015-10-21 (×2): 40 mg via INTRAVENOUS
  Filled 2015-10-20 (×2): qty 4

## 2015-10-20 MED ORDER — POTASSIUM CHLORIDE CRYS ER 20 MEQ PO TBCR: 20.0000 meq | EXTENDED_RELEASE_TABLET | Freq: Two times a day (BID) | ORAL | Status: DC

## 2015-10-20 MED ORDER — ONDANSETRON HCL 4 MG PO TABS: 4.0000 mg | ORAL_TABLET | Freq: Four times a day (QID) | ORAL | Status: DC | PRN

## 2015-10-20 MED ORDER — CARVEDILOL 6.25 MG PO TABS: 6.2500 mg | ORAL_TABLET | Freq: Two times a day (BID) | ORAL | Status: DC

## 2015-10-20 MED ORDER — ONDANSETRON HCL 4 MG/2ML IJ SOLN: 4.0000 mg | Freq: Four times a day (QID) | INTRAMUSCULAR | Status: DC | PRN

## 2015-10-20 MED ORDER — IRBESARTAN 300 MG PO TABS
300.0000 mg | ORAL_TABLET | Freq: Every day | ORAL | Status: DC
Start: 2015-10-21 — End: 2015-10-23
  Administered 2015-10-21 – 2015-10-22 (×2): 300 mg via ORAL
  Filled 2015-10-20 (×2): qty 1

## 2015-10-20 MED ORDER — SODIUM POLYSTYRENE SULFONATE 15 GM/60ML PO SUSP
15.0000 g | Freq: Once | ORAL | Status: AC
  Administered 2015-10-20: 15 g via ORAL
  Filled 2015-10-20: qty 60

## 2015-10-20 MED ORDER — ACETAMINOPHEN 650 MG RE SUPP: 650.0000 mg | Freq: Four times a day (QID) | RECTAL | Status: DC | PRN

## 2015-10-20 NOTE — Care Management Note (Signed)
Case Management Note  Patient Details  Name: [name] MRN: [id number] Date of Birth: 05-15-1946  Subjective/Objective:   70 y.o. M who fell at home on 10/19/2015 and was unable to get himself up out of floor. This has happened in the past according to the brother and his wife at the bedside. Pt generally lives with 38 yo Father who is currently not feeling up to caring for this 335 lb male, as he usually does. CM consulted  by CSW as pt as [facility] an will require pre-authorization in order to go to ALF or SNF. Pt currently Immobile and will not be able to return home in current condition.                Action/Plan: Discussed frankly with pt that he will need to begin planning for the long term instead of crisis to crisis and can no longer depend on 7 yo father to assist him. Pt agrees reluctantly. Has a brother that lives within a few miles. Pt is to be admitted and can be discharged to SNF with approval on 10/21/2015.    Expected Discharge Date:                  Expected Discharge Plan:  Skilled Nursing Facility  In-House Referral:  Clinical Social Work  Discharge planning Services  CM Consult  Post Acute Care Choice:    Choice offered to:  Patient, Sibling  DME Arranged:    DME Agency:     HH Arranged:    HH Agency:     Status of Service:  In process, will continue to follow  Medicare Important Message Given:    Date Medicare IM Given:    Medicare IM give by:    Date Additional Medicare IM Given:    Additional Medicare Important Message give by:     If discussed at Long Length of Stay Meetings, dates discussed:    Additional Comments:  Yvone Neu, RN 10/20/2015, 4:35 PM

## 2015-10-20 NOTE — Progress Notes (Signed)
Patient arrived to [address] Room 1 at 2000. [facility] have been notified via Amion per order of patients arrival to the unit. Nursing will continue to monitor.

## 2015-10-20 NOTE — ED Notes (Signed)
Received pt from home with c/o yesterday at 3pm hit by refrigerator door when trying to close it knocking him to the floor. Pt was unable to get up and managed to crawl to a phone to call EMS. Pt has swelling to B/L legs.

## 2015-10-20 NOTE — NC FL2 (Signed)
[place] MEDICAID FL2 LEVEL OF CARE SCREENING TOOL     IDENTIFICATION  Patient Name: [name] Birthdate: 1946-07-24 Sex: male Admission Date (Current Location): 10/20/2015  [facility] and [place] Number:  Producer, television/film/video and Address:  [facility] [address] [place], [place] [address]      Provider Number: [id number]  Attending Physician Name and Address:  Margarita Grizzle, MD  Relative Name and Phone Number:       Current Level of Care: SNF Recommended Level of Care: Skilled Nursing Facility Prior Approval Number:    Date Approved/Denied: 05/20/10 PASRR Number: [id number]  Discharge Plan: SNF    Current Diagnoses: Patient Active Problem List   Diagnosis Date Noted  . Streptococcus viridans infection 02/13/2015  . Sepsis (HCC) 02/07/2015  . Anemia of chronic disease 02/07/2015  . Alcoholic hepatitis 02/07/2015  . Thrombocytopenia (HCC) 02/07/2015  . Diastolic dysfunction with acute on chronic heart failure (HCC) 02/07/2015  . Elevated troponin 02/06/2015  . Hematoma 04/24/2013  . Atrial fibrillation (HCC) 04/24/2013  . Acute respiratory failure with hypoxia (HCC) 04/23/2013  . Cellulitis 04/23/2013  . Essential hypertension 12/06/2009    Orientation RESPIRATION BLADDER Height & Weight    Self, Time, Situation, Place  Normal  (has not assessed at this time)   335 lbs.  BEHAVIORAL SYMPTOMS/MOOD NEUROLOGICAL BOWEL NUTRITION STATUS       (has not assessed at this time)    AMBULATORY STATUS COMMUNICATION OF NEEDS Skin   Extensive Assist Verbally Normal                       Personal Care Assistance Level of Assistance  Bathing, Feeding, Dressing Bathing Assistance: Maximum assistance Feeding assistance: Maximum assistance Dressing Assistance: Maximum assistance     Functional Limitations Info  Sight, Hearing, Speech Sight Info: Impaired Hearing Info: Adequate Speech Info: Adequate    SPECIAL CARE FACTORS  FREQUENCY  PT (By licensed PT), OT (By licensed OT)     PT Frequency: TBD OT Frequency: TBD            Contractures      Additional Factors Info  Code Status, Allergies Code Status Info: Prior Allergies Info: No Known Allergies            Current Medications (10/20/2015):  This is the current hospital active medication list Current Facility-Administered Medications  Medication Dose Route Frequency Provider Last Rate Last Dose  . 0.9 %  sodium chloride infusion   Intravenous Once Eyvonne Mechanic, PA-C       Current Outpatient Prescriptions  Medication Sig Dispense Refill  . carvedilol (COREG) 6.25 MG tablet Take 6.25 mg by mouth 2 (two) times daily with a meal.    . colchicine 0.6 MG tablet Take 0.6 mg by mouth daily. colcrys    . Cyanocobalamin (VITAMIN B-12 PO) Take 1 tablet by mouth daily.    . ferrous sulfate 325 (65 FE) MG tablet Take 1 tablet by mouth daily.    . furosemide (LASIX) 40 MG tablet Take 1 tablet (40 mg total) by mouth daily. 30 tablet 0  . olmesartan (BENICAR) 40 MG tablet Take 40 mg by mouth daily.    . potassium chloride SA (K-DUR,KLOR-CON) 20 MEQ tablet Take 1 tablet by mouth 2 (two) times daily.    . pravastatin (PRAVACHOL) 10 MG tablet Take 10 mg by mouth daily.    . vitamin C (ASCORBIC ACID) 500 MG tablet Take 500 mg  by mouth daily.    Marland Kitchen warfarin (COUMADIN) 5 MG tablet Take 2.5-5 mg by mouth daily with breakfast. Takes a full tablet on MWF and half of a tablet (2.5mg ) on all other days.       Discharge Medications: Please see discharge summary for a list of discharge medications.  Relevant Imaging Results:  Relevant Lab Results:   Additional Information Social Security #: [id number]  Loleta Dicker, LCSW

## 2015-10-20 NOTE — Progress Notes (Signed)
ANTICOAGULATION CONSULT NOTE - Initial Consult  Pharmacy Consult for Warfarin  Indication: atrial fibrillation  No Known Allergies  Patient Measurements: Height: 6' (182.9 cm) Weight: (!) 335 lb (151.955 kg) IBW/kg (Calculated) : 77.6  Vital Signs: Temp: 97.8 F (36.6 C) (01/08 2007) Temp Source: Oral (01/08 2007) BP: 114/62 mmHg (01/08 2007) Pulse Rate: 88 (01/08 2007)  Labs:  Recent Labs  10/20/15 1115 10/20/15 2040  HGB 13.6  --   HCT 42.9  --   PLT 139*  --   LABPROT  --  55.5*  INR  --  6.61*  CREATININE 1.18  --   CKTOTAL 96  --     Estimated Creatinine Clearance: 91 mL/min (by C-G formula based on Cr of 1.18).   Medical History: Past Medical History  Diagnosis Date  . Cardiomyopathy     nonischemic  . CHF (congestive heart failure) (HCC)     ejection fractio of 45% per echo in 2011 (previos EF 35-40% in 2003), echo 04/2013  -LVEF 50-55%.  . Alcoholism (HCC)   . Atrial fibrillation or flutter   . Hypertension   . Obesity   . Ventricular tachycardia (HCC)     nonsustained, asymptomatic  . Anemia   . Hypoxemia   . GERD (gastroesophageal reflux disease)     takes tums prn  . Complication of anesthesia     April 11, 2013    Assessment: 70 y/o M here with fall, warfarin PTA for afib, INR is SUPRA-therapeutic on admit at 6.61  Goal of Therapy:  INR 2-3 Monitor platelets by anticoagulation protocol: Yes   Plan:  -Hold warfarin for now -Daily PT/INR, resume warfarin as INR allows -Monitor for bleeding  [name] 10/20/2015,10:40 PM

## 2015-10-20 NOTE — ED Provider Notes (Signed)
CSN: [id number]     Arrival date & time 10/20/15  1047 History   First MD Initiated Contact with Patient 10/20/15 1048     Chief Complaint  Patient presents with  . Fall    HPI    70 year old male presents status post fall. Patient was brought via EMS after falling at 2:30 PM yesterday ( approximately 20 hours) prior ED arrival. Patient reports that he was tried to get something out of the fridge when he was using his walker, the fridge door knocked him over and he was unable to get back to his feet. He was able to find a phone and called EMS today. He denies any loss of consciousness, reports this was a mechanical fall denies any trauma from the fall. Patient does report that he is taking blood thinners for history atrial fibrillation, denies any signs of bleeding or bruising. Patient reports that he was laying on his back the entire time with towels covering him. Patient did not want to come to the emergency room, patient's brother advised him to come for evaluation. Patient reports that he lives with his father who is in the hospital at the present moment, he reports his father is in good condition and very independent. He notes that his brother comes to check on him on a regular basis.  Past Medical History  Diagnosis Date  . Cardiomyopathy     nonischemic  . CHF (congestive heart failure) (HCC)     ejection fractio of 45% per echo in 2011 (previos EF 35-40% in 2003), echo 04/2013  -LVEF 50-55%.  . Alcoholism (HCC)   . Atrial fibrillation or flutter   . Hypertension   . Obesity   . Ventricular tachycardia (HCC)     nonsustained, asymptomatic  . Anemia   . Hypoxemia   . GERD (gastroesophageal reflux disease)     takes tums prn  . Complication of anesthesia     April 11, 2013   Past Surgical History  Procedure Laterality Date  . Tonsillectomy    . Knee bursectomy Right 04/20/2013    Procedure: Right knee prepatella bursa decompression;  Surgeon: Cammy Copa, MD;  Location: [facility]  OR;  Service: Orthopedics;  Laterality: Right;  . I&d extremity Right 06/28/2013    Procedure: IRRIGATION AND DEBRIDEMENT OF RIGHT LEG WITH SURGICAL PREP/PLACEMENT OF ACELL ;  Surgeon: Wayland Denis, DO;  Location: [facility] OR;  Service: Plastics;  Laterality: Right;   Family History  Problem Relation Age of Onset  . COPD Mother   . Hypertension Father    Social History  Substance Use Topics  . Smoking status: Former Smoker    Start date: 06/29/1987  . Smokeless tobacco: Never Used     Comment: stopped in 1985  . Alcohol Use: 6.0 oz/week    10 Cans of beer per week    Review of Systems  All other systems reviewed and are negative.   Allergies  Review of patient's allergies indicates no known allergies.  Home Medications   Prior to Admission medications   Medication Sig Start Date End Date Taking? Authorizing Provider  carvedilol (COREG) 6.25 MG tablet Take 6.25 mg by mouth 2 (two) times daily with a meal.   Yes Historical Provider, MD  colchicine 0.6 MG tablet Take 0.6 mg by mouth daily. colcrys   Yes Historical Provider, MD  Cyanocobalamin (VITAMIN B-12 PO) Take 1 tablet by mouth daily.   Yes Historical Provider, MD  ferrous sulfate 325 (65 FE) MG  tablet Take 1 tablet by mouth daily.   Yes Historical Provider, MD  furosemide (LASIX) 40 MG tablet Take 1 tablet (40 mg total) by mouth daily. 02/13/15  Yes Nishant Dhungel, MD  olmesartan (BENICAR) 40 MG tablet Take 40 mg by mouth daily.   Yes Historical Provider, MD  potassium chloride SA (K-DUR,KLOR-CON) 20 MEQ tablet Take 1 tablet by mouth 2 (two) times daily. 01/08/15  Yes Historical Provider, MD  pravastatin (PRAVACHOL) 10 MG tablet Take 10 mg by mouth daily.   Yes Historical Provider, MD  vitamin C (ASCORBIC ACID) 500 MG tablet Take 500 mg by mouth daily.   Yes Historical Provider, MD  warfarin (COUMADIN) 5 MG tablet Take 2.5-5 mg by mouth daily with breakfast. Takes a full tablet on MWF and half of a tablet (2.5mg ) on all other days.  11/15/14  Yes Historical Provider, MD   BP 120/67 mmHg  Pulse 71  Temp(Src) 97.5 F (36.4 C) (Oral)  Resp 11  Ht 6' (1.829 m)  Wt 151.955 kg  BMI 45.42 kg/m2  SpO2 98%   Physical Exam  Constitutional: He is oriented to person, place, and time. He appears well-developed and well-nourished.  HENT:  Head: Normocephalic and atraumatic.  Eyes: Conjunctivae are normal. Pupils are equal, round, and reactive to light. Right eye exhibits no discharge. Left eye exhibits no discharge. No scleral icterus.  Neck: Normal range of motion. No JVD present. No tracheal deviation present.  Cardiovascular: Exam reveals no gallop and no friction rub.   No murmur heard. Pulmonary/Chest: Effort normal and breath sounds normal. No stridor. No respiratory distress. He has no wheezes. He has no rales. He exhibits no tenderness.  Abdominal: Bowel sounds are normal. He exhibits no distension and no mass. There is no tenderness. There is no rebound and no guarding.  Musculoskeletal:  Numerous abrasions and minor bruising to the upper and lower extremity, no open wounds. Distal chronic edema with skin changes. No active cellulitis. No CT or L-spine tenderness. Sensation intact to distal extremities.   Neurological: He is alert and oriented to person, place, and time. Coordination normal.  Skin: Skin is warm and dry.  Psychiatric: He has a normal mood and affect. His behavior is normal. Judgment and thought content normal.  Nursing note and vitals reviewed.     ED Course  Procedures (including critical care time) Labs Review Labs Reviewed  CBC WITH DIFFERENTIAL/PLATELET - Abnormal; Notable for the following:    RBC 4.08 (*)    MCV 105.1 (*)    RDW 15.8 (*)    Platelets 139 (*)    Lymphs Abs 0.6 (*)    All other components within normal limits  COMPREHENSIVE METABOLIC PANEL - Abnormal; Notable for the following:    Potassium 5.6 (*)    CO2 20 (*)    BUN 27 (*)    Alkaline Phosphatase 153 (*)    Total  Bilirubin 2.3 (*)    All other components within normal limits  CK    Imaging Review Dg Chest 2 View  10/20/2015  CLINICAL DATA:  Status post fall 10/19/2015. EXAM: CHEST  2 VIEW COMPARISON:  Single view of the chest 10/14/2015. FINDINGS: There is cardiomegaly without edema. No pneumothorax or pleural effusion. No focal bony abnormality. IMPRESSION: Cardiomegaly without acute disease. Electronically Signed   By: Drusilla Kanner M.D.   On: 10/20/2015 13:27   I have personally reviewed and evaluated these images and lab results as part of my medical decision-making.  EKG Interpretation None      MDM   Final diagnoses:  Fall, initial encounter    Labs: CK CBC, CMP  Imaging: DG chest 2 view  Consults:  Therapeutics:  Discharge Meds:   Assessment/Plan: 70 year old male presents status post fall. Patient has had numerous falls recently per family. Patient has been unable to get back to his feet on his own. Family notes that he is unable to take care of himself, is a danger to himself at home. They report that he has had similar problems in the past, has been admitted to rehabilitation facility which provided good physical therapy and ability to live independently. They report that he lives with her father who is in his 12s, he is unable to take care of patient or keep him safe. Patient is morbidly obese with a weight of 151 kg here she is unable to ambulate, I attempted ambulation patient reports that he would not be able to stand on his own. Due to patient's large size, most recent falls, and fear of falling I did not force the patient to attempt ambulation. The patient living alone, decompensated status patient likely need overnight observation and rehabilitation. I spoke with case management who would be able to place patient tomorrow. Patient was in agreement with today's plan. Hospitalist was consult, appreciate their assistance.         Eyvonne Mechanic, PA-C 10/20/15  1638  Margarita Grizzle, MD 10/21/15 [phone]

## 2015-10-20 NOTE — H&P (Signed)
[facility] History and Physical  [name]:[id number] DOB: December 07, 1945 DOA: 10/20/2015  Referring physician: Trey Paula - PA, [facility] PCP: [name], MD   Chief Complaint: Fall  HPI: [name] is a 70 y.o. male  Patient comes in after sustaining a mechanical fall being on his kitchen floor for partially 20 hours. Patient was unable to reach a phone on the edge of the counter where he called EMS. Patient states that he was ambulating in his kitchen when the refrigerator door hit him causing him to fall over. Patient gained weight baseline with a walker. Patient denies any dizziness, chest pain, palpitations, fevers, nausea, vomiting, LOC, head trauma. Patient states that over the last couple weeks he is following with significantly greater frequency. Patient also states that this happens from time to time until he is admitted to a nursing home where he undergoes rehabilitation to regain his strength. Patient states compliance with his home medications.    Review of Systems:  Constitutional:  No weight loss, night sweats, Fevers, chills, fatigue.  HEENT:  No headaches, Difficulty swallowing,Tooth/dental problems,Sore throat, Cardio-vascular:  No chest pain, Orthopnea, PND, swelling in lower extremities, anasarca, dizziness, palpitations  GI:  No heartburn, indigestion, abdominal pain, nausea, vomiting, diarrhea, change in bowel habits, loss of appetite  Resp:   No shortness of breath with exertion or at rest. No excess mucus, no productive cough, No non-productive cough, No coughing up of blood.No change in color of mucus.No wheezing.No chest wall deformity  Skin:  no rash or lesions.  GU:  no dysuria, change in color of urine, no urgency or frequency. No flank pain.  Musculoskeletal:  Per [facility] Psych:  No change in mood or affect. No depression or anxiety. No memory loss.  Neuro:  No change in sensation, unilateral strength, or cognitive abilities  All other systems  were reviewed and are negative.  Past Medical History  Diagnosis Date  . Cardiomyopathy     nonischemic  . CHF (congestive heart failure) (HCC)     ejection fractio of 45% per echo in 2011 (previos EF 35-40% in 2003), echo 04/2013  -LVEF 50-55%.  . Alcoholism (HCC)   . Atrial fibrillation or flutter   . Hypertension   . Obesity   . Ventricular tachycardia (HCC)     nonsustained, asymptomatic  . Anemia   . Hypoxemia   . GERD (gastroesophageal reflux disease)     takes tums prn  . Complication of anesthesia     April 11, 2013   Past Surgical History  Procedure Laterality Date  . Tonsillectomy    . Knee bursectomy Right 04/20/2013    Procedure: Right knee prepatella bursa decompression;  Surgeon: Cammy Copa, MD;  Location[facility] OR;  Service: Orthopedics;  Laterality: Right;  . I&d extremity Right 06/28/2013    Procedure: IRRIGATION AND DEBRIDEMENT OF RIGHT LEG WITH SURGICAL PREP/PLACEMENT OF ACELL ;  Surgeon: Wayland Denis, DO;  Location: [facility] OR;  Service: Plastics;  Laterality: Right;   Social History:  reports that he has quit smoking. He started smoking about 28 years ago. He has never used smokeless tobacco. He reports that he drinks about 6.0 oz of alcohol per week. He reports that he does not use illicit drugs.  No Known Allergies  Family History  Problem Relation Age of Onset  . COPD Mother   . Hypertension Father      Prior to Admission medications   Medication Sig Start Date End Date Taking? Authorizing Provider  carvedilol (COREG) 6.25 MG tablet Take 6.25 mg by mouth 2 (two) times daily with a meal.   Yes Historical Provider, MD  colchicine 0.6 MG tablet Take 0.6 mg by mouth daily. colcrys   Yes Historical Provider, MD  Cyanocobalamin (VITAMIN B-12 PO) Take 1 tablet by mouth daily.   Yes Historical Provider, MD  ferrous sulfate 325 (65 FE) MG tablet Take 1 tablet by mouth daily.   Yes Historical Provider, MD  furosemide (LASIX) 40 MG tablet Take 1 tablet (40 mg  total) by mouth daily. 02/13/15  Yes Nishant Dhungel, MD  olmesartan (BENICAR) 40 MG tablet Take 40 mg by mouth daily.   Yes Historical Provider, MD  potassium chloride SA (K-DUR,KLOR-CON) 20 MEQ tablet Take 1 tablet by mouth 2 (two) times daily. 01/08/15  Yes Historical Provider, MD  pravastatin (PRAVACHOL) 10 MG tablet Take 10 mg by mouth daily.   Yes Historical Provider, MD  vitamin C (ASCORBIC ACID) 500 MG tablet Take 500 mg by mouth daily.   Yes Historical Provider, MD  warfarin (COUMADIN) 5 MG tablet Take 2.5-5 mg by mouth daily with breakfast. Takes a full tablet on MWF and half of a tablet (2.5mg ) on all other days. 11/15/14  Yes Historical Provider, MD   Physical Exam: Filed Vitals:   10/20/15 1500 10/20/15 1530 10/20/15 1600 10/20/15 1615  BP: 125/74 115/61 120/67   Pulse: 56 64  71  Temp:      TempSrc:      Resp: Height:      Weight:      SpO2: 99% 97% 98%     Wt Readings from Last 3 Encounters:  10/20/15 151.955 kg (335 lb)  10/14/15 142.883 kg (315 lb)  06/24/15 133.403 kg (294 lb 1.6 oz)    General: Elderlly, Appears calm and comfortable Eyes:  PERRL, EOMI, normal lids, iris ENT:  MMM, grossly normal hearing, lips & tongue Neck:  no LAD, masses or thyromegaly Cardiovascular:  RRR, no m/r/g. 2+ LE pitting edema Respiratory:  Few R lung wheezes, nml effort.  Abdomen: Morbidly obese soft, ntnd Skin:  Dry scaly skin of the lower external is consistent with venous stasis dermatitis Musculoskeletal:  grossly normal tone BUE/BLE Psychiatric:  grossly normal mood and affect, speech fluent and appropriate Neurologic:  CN 2-12 grossly intact, moves all extremities in coordinated fashion.          Labs on Admission:  Basic Metabolic Panel:  Recent Labs Lab 10/14/15 0208 10/20/15 1115  NA 144 143  K 5.2* 5.6*  CL 112* 108  CO2 19* 20*  GLUCOSE 78 71  BUN 29* 27*  CREATININE 1.09 1.18  CALCIUM 8.9 9.1   Liver Function Tests:  Recent Labs Lab  10/14/15 0208 10/20/15 1115  AST 28 40  ALT 13* 20  ALKPHOS 121 153*  BILITOT 1.6* 2.3*  PROT 6.9 7.7  ALBUMIN 3.3* 3.8   No results for input(s): LIPASE, AMYLASE in the last 168 hours. No results for input(s): AMMONIA in the last 168 hours. CBC:  Recent Labs Lab 10/14/15 0208 10/20/15 1115  WBC 3.8* 5.4  NEUTROABS 2.3 4.4  HGB 11.7* 13.6  HCT 36.3* 42.9  MCV 102.3* 105.1*  PLT 127* 139*   Cardiac Enzymes:  Recent Labs Lab 10/20/15 1115  CKTOTAL 96    BNP (last 3 results)  Recent Labs  02/06/15 0838  BNP 1119.2*    ProBNP (last 3 results) No results for input(s): PROBNP in the  last 8760 hours.   CREATININE: 1.18 (10/20/15 1115) Estimated creatinine clearance - 91 mL/min  CBG: No results for input(s): GLUCAP in the last 168 hours.  Radiological Exams on Admission: Dg Chest 2 View  10/20/2015  CLINICAL DATA:  Status post fall 10/19/2015. EXAM: CHEST  2 VIEW COMPARISON:  Single view of the chest 10/14/2015. FINDINGS: There is cardiomegaly without edema. No pneumothorax or pleural effusion. No focal bony abnormality. IMPRESSION: Cardiomegaly without acute disease. Electronically Signed   By: Drusilla Kanner M.D.   On: 10/20/2015 13:27     Assessment/Plan Active Problems:   Recurrent falls   Physical deconditioning: Patient found down after falling. On the ground for 20 hours. 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  This apparently happens to patient from time to time until he is able to rehabilitation in a nursing home to regain his strength. Per case management and social work patient is able to go to a nursing home on 10/21/2015 from the hospital. Patient unable to care for himself at his home. - medsurge/obs - CK in a.m. - PT/OT - Nutrition consult - IVF - Transfer to NH in am  Chronic CHF: Echo on 01/30/2015 showing EF of 66% and grade 3 diastolic dysfunction. Significant 3+ LE pitting edema. Denies any respiratory decompensation.  Patient with 40 pound weight gain over the last 4 months - Increase lasix to 40IV - continue ARB, bblocker  Hyperkalemia: 5.6 on admission - IVF - Kayexelate 15 x1 - BMET in am  HTN: - continue olmesartan, coreg  HLD: - continue statin  Afib: - continue bblocker - Warfarin   Code Status: DNR  DVT Prophylaxis: coumadin Family Communication: none Disposition Plan: Pending Improvement    Nello Corro Shela Commons, MD Family Medicine [facility] [email or web address] Password [email or web address]

## 2015-10-20 NOTE — Progress Notes (Signed)
CRITICAL VALUE ALERT  Critical value received:  INR 6.61  Date of notification:  10/20/2015  Time of notification:  2216  Critical value read back:Yes.    Nurse who received alert:  Ricky Stabs, RN  MD notified (1st page):  [facility] Psychologist, prison and probation services)  Time of first page:  2217  MD notified (2nd page):  Time of second page:  Responding MD:  Clearence Ped, NP  Time MD responded:  2230

## 2015-10-21 DIAGNOSIS — I10 Essential (primary) hypertension: Secondary | ICD-10-CM | POA: Diagnosis not present

## 2015-10-21 DIAGNOSIS — R5381 Other malaise: Secondary | ICD-10-CM | POA: Diagnosis not present

## 2015-10-21 DIAGNOSIS — I5033 Acute on chronic diastolic (congestive) heart failure: Secondary | ICD-10-CM | POA: Diagnosis not present

## 2015-10-21 DIAGNOSIS — I482 Chronic atrial fibrillation: Secondary | ICD-10-CM | POA: Diagnosis not present

## 2015-10-21 DIAGNOSIS — R296 Repeated falls: Secondary | ICD-10-CM | POA: Diagnosis not present

## 2015-10-21 LAB — MRSA PCR SCREENING: MRSA by PCR: POSITIVE — AB

## 2015-10-21 LAB — BASIC METABOLIC PANEL
Anion gap: 10 (ref 5–15)
BUN: 27 mg/dL — AB (ref 6–20)
CHLORIDE: 112 mmol/L — AB (ref 101–111)
CO2: 24 mmol/L (ref 22–32)
CREATININE: 1.18 mg/dL (ref 0.61–1.24)
Calcium: 8.4 mg/dL — ABNORMAL LOW (ref 8.9–10.3)
Glucose, Bld: 81 mg/dL (ref 65–99)
Potassium: 4.5 mmol/L (ref 3.5–5.1)
SODIUM: 146 mmol/L — AB (ref 135–145)

## 2015-10-21 LAB — CK: CK TOTAL: 86 U/L (ref 49–397)

## 2015-10-21 LAB — PROTIME-INR
INR: 7.9 — AB (ref 0.00–1.49)
PROTHROMBIN TIME: 63.4 s — AB (ref 11.6–15.2)

## 2015-10-21 MED ORDER — ALPRAZOLAM 0.25 MG PO TABS
0.2500 mg | ORAL_TABLET | Freq: Every evening | ORAL | Status: DC | PRN
  Administered 2015-10-21 – 2015-10-24 (×4): 0.25 mg via ORAL
  Filled 2015-10-21 (×4): qty 1

## 2015-10-21 MED ORDER — POLYETHYLENE GLYCOL 3350 17 G PO PACK
17.0000 g | PACK | Freq: Every day | ORAL | Status: DC
  Administered 2015-10-21 – 2015-10-25 (×4): 17 g via ORAL
  Filled 2015-10-21 (×5): qty 1

## 2015-10-21 MED ORDER — PRO-STAT SUGAR FREE PO LIQD
30.0000 mL | Freq: Two times a day (BID) | ORAL | Status: DC
  Administered 2015-10-21 – 2015-10-25 (×8): 30 mL via ORAL
  Filled 2015-10-21 (×8): qty 30

## 2015-10-21 MED ORDER — SENNOSIDES-DOCUSATE SODIUM 8.6-50 MG PO TABS
2.0000 | ORAL_TABLET | Freq: Two times a day (BID) | ORAL | Status: DC
  Administered 2015-10-21 – 2015-10-25 (×6): 2 via ORAL
  Filled 2015-10-21 (×8): qty 2

## 2015-10-21 MED ORDER — TRAMADOL HCL 50 MG PO TABS
50.0000 mg | ORAL_TABLET | Freq: Four times a day (QID) | ORAL | Status: DC | PRN
  Administered 2015-10-21 – 2015-10-25 (×8): 50 mg via ORAL
  Filled 2015-10-21 (×8): qty 1

## 2015-10-21 NOTE — Progress Notes (Signed)
Initial Nutrition Assessment  DOCUMENTATION CODES:   Morbid obesity  INTERVENTION:  Provide 30 ml Prostat po BID, each supplement provides 100 kcal and 15 grams of protein.   Encourage adequate PO intake.   NUTRITION DIAGNOSIS:   Increased nutrient needs related to chronic illness as evidenced by estimated needs.  GOAL:   Patient will meet greater than or equal to 90% of their needs  MONITOR:   PO intake, Supplement acceptance, Weight trends, Labs, I & O's  REASON FOR ASSESSMENT:   Consult Assessment of nutrition requirement/status  ASSESSMENT:   Patient comes in after sustaining a mechanical fall being on his kitchen floor for partially 20 hours. Patient was unable to reach a phone on the edge of the counter where he called EMS. Patient states that he was ambulating in his kitchen when the refrigerator door hit him causing him to fall over.  Pt reports having a good appetite currently and PTA with usual consumption of at least 2 meals a day (pt does not eat breakfast) with no other difficulties. Pt reports no po intake over the past 3 days. Pt however is ready for his upcomming meal. Pt with weight gain, noted pt with +2 lower extremity edema. RD to order Prostat to aid in protein needs. Pt was encouraged to eat his food at meals.   Pt with no observed significant fat or muscle mass loss.  Labs and medications reviewed.   Diet Order:  Diet Heart Room service appropriate?: Yes; Fluid consistency:: Thin  Skin:  Reviewed, no issues  Last BM:  1/8  Height:   Ht Readings from Last 1 Encounters:  10/20/15 6' (1.829 m)    Weight:   Wt Readings from Last 1 Encounters:  10/21/15 341 lb 14.9 oz (155.1 kg)    Ideal Body Weight:  80.9 kg  BMI:  Body mass index is 46.36 kg/(m^2).  Estimated Nutritional Needs:   Kcal:  2100-2400  Protein:  110-130 grams  Fluid:  2.1-2.4 L/day  EDUCATION NEEDS:   No education needs identified at this time  Roslyn Smiling,  MS, RD, LDN Pager # [phone] After hours/ weekend pager # [phone]

## 2015-10-21 NOTE — Progress Notes (Signed)
ANTICOAGULATION CONSULT NOTE - Initial Consult  Pharmacy Consult for Warfarin  Indication: atrial fibrillation  No Known Allergies  Labs:  Recent Labs  10/20/15 1115 10/20/15 2040 10/21/15 0522  HGB 13.6  --   --   HCT 42.9  --   --   PLT 139*  --   --   LABPROT  --  55.5* 63.4*  INR  --  6.61* 7.90*  CREATININE 1.18  --  1.18  CKTOTAL 96  --  86    Assessment: 70 y/o M admitted 10/20/15 after a mechanical fall. On warfarin PTA for afib with last dose on 10/19/15. INR 7.9 this morning. CBC stable with no bleeding reported.   Goal of Therapy:  INR 2-3 Monitor platelets by anticoagulation protocol: Yes   Plan:  -continue to hold warfarin -Daily PT/INR, resume warfarin as INR allows -Monitor for bleeding  Pollyann Samples, PharmD, BCPS 10/21/2015, 10:25 AM Pager: [phone]

## 2015-10-21 NOTE — Progress Notes (Signed)
PT Cancellation Note  Patient Details Name: [name] MRN: [id number] DOB: 03-04-1946   Cancelled Treatment:    Reason Eval/Treat Not Completed: Medical issues which prohibited therapy.  Pt's INR is 7.9 putting him at high risk for spontaneous bleeding. PT will not attempt EOB, standing, or OOB today.  Will check back tomorrow.  See OT eval for bed level assessment.  Thanks,    [name]. Zander Ingham, PT, DPT [phone]   10/21/2015, 10:08 AM

## 2015-10-21 NOTE — Progress Notes (Signed)
Occupational Therapy Evaluation Patient Details Name: [name] MRN: [id number] DOB: 07/04/1946 Today's Date: 10/21/2015    History of Present Illness 70 y.o. male admitted after a fall on 71/7/17 and was unable to get himself up off the floor. PMH sigificant for cardiomyopathy. CHF, alcoholism, afib, HTN, morbid obesity, ventricular tachycardia, anemia, hypoxemia, and GERD.   Clinical Impression   PTA, pt was independent with ADLs and IADLs and used a RW or SPC for functional mobility. Pt currently presents with severe generalized weakness, bilateral LE swelling, and a critically high INR value that limited pt's ability to participate safely in therapy session. Pt completed grooming tasks sitting EOB and practiced bed mobility with min-mod assist. Pt plans to d/c to SNF for post-acute rehab stay to increase strength and independence with ADLs and mobility to allow safe return home. Pt will benefit from continued acute OT to increase safety and independence with ADLs and mobility. Will continue to follow acutely.    Follow Up Recommendations  SNF;Supervision/Assistance - 24 hour    Equipment Recommendations  None recommended by OT (TBD in next venue)    Recommendations for Other Services       Precautions / Restrictions Precautions Precautions: Fall Restrictions Weight Bearing Restrictions: No      Mobility Bed Mobility Overal bed mobility: Needs Assistance Bed Mobility: Rolling;Supine to Sit;Sit to Supine Rolling: Min assist   Supine to sit: Mod assist;HOB elevated Sit to supine: Mod assist   General bed mobility comments: HOB elevated and heavy use of bedrails. Mod assist for trunk support to come to sitting and to progress legs onto bed to return to supine. Pt able to roll in bed and pull up on bedrail to reposition self with min assist.  Transfers                 General transfer comment: Did not attempt transfers at this time due to critically high INR  value.    Balance                                            ADL Overall ADL's : Needs assistance/impaired Eating/Feeding: Set up;Bed level   Grooming: Wash/dry hands;Oral care;Minimal assistance;Sitting                                 General ADL Comments: Pt completed grooming tasks sitting EOB and needed min assist to maintain upright position due to weakness. Did not attempt transfers at this time due to pt's critical INR value of 7.90     Vision Vision Assessment?: Yes Eye Alignment: Impaired (comment) (dysconjugate gaze) Alignment/Gaze Preference: Other (comment) (dysconjugate gaze ) Tracking/Visual Pursuits: Right eye does not track medially;Decreased smoothness of horizontal tracking;Decreased smoothness of vertical tracking;Requires cues, head turns, or add eye shifts to track Saccades: Decreased speed of saccadic movement;Additional eye shifts occurred during testing Convergence: Impaired - to be further tested in functional context Visual Fields: Impaired-to be further tested in functional context   Perception     Praxis      Pertinent Vitals/Pain Pain Assessment: No/denies pain     Hand Dominance Left   Extremity/Trunk Assessment Upper Extremity Assessment Upper Extremity Assessment: Generalized weakness   Lower Extremity Assessment Lower Extremity Assessment: Defer to PT evaluation   Cervical / Trunk Assessment Cervical / Trunk  Assessment: Kyphotic;Other exceptions Cervical / Trunk Exceptions: possibly due to body habitus   Communication Communication Communication: No difficulties   Cognition Arousal/Alertness: Awake/alert Behavior During Therapy: WFL for tasks assessed/performed Overall Cognitive Status: Within Functional Limits for tasks assessed                     General Comments       Exercises       Shoulder Instructions      Home Living Family/patient expects to be discharged to:: Private  residence Living Arrangements: Parent (7 y.o. father) Available Help at Discharge: Family;Available PRN/intermittently Type of Home: House Home Access: Stairs to enter [employer] of Steps: 1   Home Layout: One level     Bathroom Shower/Tub: Walk-in shower;Curtain   Bathroom Toilet: Handicapped height     Home Equipment: Environmental consultant - 2 wheels;Cane - single point;Bedside commode;Shower seat;Grab bars - toilet;Grab bars - tub/shower;Adaptive equipment;Hospital bed Adaptive Equipment: Reacher Additional Comments: Expects to be d/c to SNF for rehab      Prior Functioning/Environment Level of Independence: Independent with assistive device(s)        Comments: Uses RW or SPC for mobility; does all driving, grocery shopping, cooking and cleaning    OT Diagnosis: Generalized weakness   OT Problem List: Decreased strength;Decreased activity tolerance;Decreased range of motion;Impaired balance (sitting and/or standing);Decreased coordination;Impaired vision/perception;Decreased safety awareness;Decreased knowledge of use of DME or AE;Obesity;Increased edema   OT Treatment/Interventions: Self-care/ADL training;Therapeutic exercise;Energy conservation;DME and/or AE instruction;Therapeutic activities;Patient/family education;Balance training    OT Goals(Current goals can be found in the care plan section) Acute Rehab OT Goals Patient Stated Goal: to go to rehab to get stronger and then go home OT Goal Formulation: With patient Time For Goal Achievement: 11/04/15 Potential to Achieve Goals: Good ADL Goals Pt Will Perform Grooming: with min guard assist;sitting Pt Will Perform Upper Body Bathing: sitting;with min assist Pt Will Perform Lower Body Bathing: with adaptive equipment;sit to/from stand;sitting/lateral leans;with mod assist Pt Will Transfer to Toilet: with mod assist;ambulating;bedside commode Pt Will Perform Toileting - Clothing Manipulation and hygiene: with mod  assist;with adaptive equipment;sitting/lateral leans;sit to/from stand  OT Frequency: Min 2X/week   Barriers to D/C: Decreased caregiver support  Pt's father is 58 y.o. and is unable to provide necessary level of physical assist       Co-evaluation              End of Session Nurse Communication: Mobility status;Other (comment) (Condom catheter was not attached on OT arrival)  Activity Tolerance: Patient limited by fatigue;Treatment limited secondary to medical complications (Comment) Patient left: in bed;with call bell/phone within reach   Time: 0922-0950 OT Time Calculation (min): 28 min Charges:  OT General Charges $OT Visit: 1 Procedure OT Evaluation $OT Eval High Complexity: 1 Procedure OT Treatments $Self Care/Home Management : 8-22 mins G-Codes: OT G-codes **NOT FOR INPATIENT CLASS** Functional Assessment Tool Used: clinical judgement Functional Limitation: Self care Self Care Current Status (A9191): At least 60 percent but less than 80 percent impaired, limited or restricted Self Care Goal Status (Y6060): At least 40 percent but less than 60 percent impaired, limited or restricted  Nils Pyle, OTR/L Pager: [phone] 10/21/2015, 10:14 AM

## 2015-10-21 NOTE — Clinical Social Work Placement (Signed)
   CLINICAL SOCIAL WORK PLACEMENT  NOTE  Date:  10/21/2015  Patient Details  Name: AIDEAN FAWCETT MRN: [id number] Date of Birth: 03-08-1946  Clinical Social Work is seeking post-discharge placement for this patient at the Skilled  Nursing Facility level of care (*CSW will initial, date and re-position this form in  chart as items are completed):  Yes   Patient/family provided with [facility] Clinical Social Work Department's list of facilities offering this level of care within the geographic area requested by the patient (or if unable, by the patient's family).  Yes   Patient/family informed of their freedom to choose among providers that offer the needed level of care, that participate in Medicare, Medicaid or managed care program needed by the patient, have an available bed and are willing to accept the patient.  Yes   Patient/family informed of [facility]'s ownership interest in [facility] and [facility], as well as of the fact that they are under no obligation to receive care at these facilities.  PASRR submitted to EDS on 10/20/15     PASRR number received on 10/20/15     Existing PASRR number confirmed on       FL2 transmitted to all facilities in geographic area requested by pt/family on 10/20/15     FL2 transmitted to all facilities within larger geographic area on       Patient informed that his/her managed care company has contracts with or will negotiate with certain facilities, including the following:        Yes   Patient/family informed of bed offers received.  Patient chooses bed at [facility]     Physician recommends and patient chooses bed at  (n/a)    Patient to be transferred to [facility] on  .  Patient to be transferred to facility by       Patient family notified on   of transfer.  Name of family member notified:        PHYSICIAN Please sign FL2     Additional Comment:     _______________________________________________ Rondel Baton, LCSW 10/21/2015, 2:29 PM

## 2015-10-21 NOTE — Progress Notes (Signed)
Patient's brother would like a call from the social worker at [phone] before transport tomorrow.  Thank you

## 2015-10-21 NOTE — Clinical Social Work Note (Signed)
Patient has chosen [facility] at time of discharge.  Barrier: Patient has [employer] (not [employer]) which requires an authorization prior to discharge.  Authorization was initiated by SNF; however PT has not evaluated patient for recommendations which is needed for authorization.  PT recommendations necessary for insurance determination.  Vickii Penna, LCSW [phone]  5N1-9; 2S 15-16 and Hospital Psychiatric Service Line Licensed Clinical Social Worker

## 2015-10-21 NOTE — Progress Notes (Signed)
Patient states that he takes Coumadin at home. Nursing will continue to monitor.

## 2015-10-21 NOTE — Care Management Obs Status (Signed)
MEDICARE OBSERVATION STATUS NOTIFICATION   Patient Details  Name: [name] MRN: [id number] Date of Birth: 03/06/1946   Medicare Observation Status Notification Given:   Yes    Durenda Guthrie, RN 10/21/2015, 11:15 AM

## 2015-10-21 NOTE — Progress Notes (Signed)
[facility] PROGRESS NOTE  QUAMEL FITZMAURICE HKV:[id number] DOB: Jun 06, 1946 DOA: 10/20/2015 PCP: [name], MD  Assessment/Plan: 1. Recurrent Falls: From deconditioning.  PT eval in am, when INR comes less than 7.  Normal CK levls.    2. Hyperkalemia: resolved.    Hypertension: Controlled.    Chronic diastolic heart failure.  Because of his pedal edema, we will resume IV lasix till discharge.   Atrial fibrillation: Rate controlled. Supra therapeutic iNR.  - no obvious signs of bleeding.  Monitor INR in am.  If INR is less than 5, can work with PT for rehab facility.   Code Status: DNR. Family Communication: none at bedside.  Disposition Plan: pending.    Consultants: none Procedures:  none  Antibiotics:  none  HPI/Subjective: Pain in legs, constipated.   Objective: Filed Vitals:   10/21/15 1248 10/21/15 1629  BP: 119/54 116/57  Pulse: 64 66  Temp: 98.5 F (36.9 C)   Resp: 16     Intake/Output Summary (Last 24 hours) at 10/21/15 1908 Last data filed at 10/21/15 1300  Gross per 24 hour  Intake    240 ml  Output    700 ml  Net   -460 ml   Filed Weights   10/20/15 1053 10/21/15 0439  Weight: 151.955 kg (335 lb) 155.1 kg (341 lb 14.9 oz)    Exam:   General:  Alert afebrile comfortable.   Cardiovascular: s1s2  Respiratory: ctab  Abdomen: soft non tender non distended bowel sounds heard  Musculoskeletal: 3+ pedal edema.   Data Reviewed: Basic Metabolic Panel:  Recent Labs Lab 10/20/15 1115 10/21/15 0522  NA 143 146*  K 5.6* 4.5  CL 108 112*  CO2 20* 24  GLUCOSE 71 81  BUN 27* 27*  CREATININE 1.18 1.18  CALCIUM 9.1 8.4*   Liver Function Tests:  Recent Labs Lab 10/20/15 1115  AST 40  ALT 20  ALKPHOS 153*  BILITOT 2.3*  PROT 7.7  ALBUMIN 3.8   No results for input(s): LIPASE, AMYLASE in the last 168 hours. No results for input(s): AMMONIA in the last 168 hours. CBC:  Recent Labs Lab 10/20/15 1115  WBC 5.4   NEUTROABS 4.4  HGB 13.6  HCT 42.9  MCV 105.1*  PLT 139*   Cardiac Enzymes:  Recent Labs Lab 10/20/15 1115 10/21/15 0522  CKTOTAL 96 86   BNP (last 3 results)  Recent Labs  02/06/15 0838  BNP 1119.2*    ProBNP (last 3 results) No results for input(s): PROBNP in the last 8760 hours.  CBG: No results for input(s): GLUCAP in the last 168 hours.  Recent Results (from the past 240 hour(s))  MRSA PCR Screening     Status: Abnormal   Collection Time: 10/20/15 11:39 PM  Result Value Ref Range Status   MRSA by PCR POSITIVE (A) NEGATIVE Final    Comment:        The GeneXpert MRSA Assay (FDA approved for NASAL specimens only), is one component of a comprehensive MRSA colonization surveillance program. It is not intended to diagnose MRSA infection nor to guide or monitor treatment for MRSA infections. RESULT CALLED TO, READ BACK BY AND VERIFIED WITH: STEPHENS,A RN [phone] 10/21/15 MITCHELL,L      Studies: Dg Chest 2 View  10/20/2015  CLINICAL DATA:  Status post fall 10/19/2015. EXAM: CHEST  2 VIEW COMPARISON:  Single view of the chest 10/14/2015. FINDINGS: There is cardiomegaly without edema. No pneumothorax or pleural effusion. No focal bony abnormality. IMPRESSION:  Cardiomegaly without acute disease. Electronically Signed   By: Drusilla Kanner M.D.   On: 10/20/2015 13:27    Scheduled Meds: . carvedilol  6.25 mg Oral BID WC  . colchicine  0.6 mg Oral Daily  . feeding supplement (PRO-STAT SUGAR FREE 64)  30 mL Oral BID  . furosemide  40 mg Intravenous BID  . irbesartan  300 mg Oral Daily  . polyethylene glycol  17 g Oral Daily  . pravastatin  10 mg Oral q1800  . senna-docusate  2 tablet Oral BID   Continuous Infusions:   Principal Problem:   Recurrent falls Active Problems:   Essential hypertension   Atrial fibrillation (HCC)   Diastolic dysfunction with acute on chronic heart failure [facility])   Physical deconditioning   Hyperkalemia    Time spent: 25  min    [facility]  [facility] Pager [phone]. If 7PM-7AM, please contact night-coverage at [email or web address], password [facility] 10/21/2015, 7:08 PM

## 2015-10-21 NOTE — Clinical Social Work Note (Signed)
Clinical Social Work Assessment  Patient Details  Name: [name] MRN: [id number] Date of Birth: 1945/11/17  Date of referral:  10/21/15               Reason for consult:  Facility Placement                Permission sought to share information with:  Oceanographer granted to share information::  Yes, Verbal Permission Granted  Name::        Agency::   [facility] SNFs)  Relationship::     Contact Information:     Housing/Transportation Living arrangements for the past 2 months:  Single Family Home Source of Information:  Patient Patient Interpreter Needed:  None Criminal Activity/Legal Involvement Pertinent to Current Situation/Hospitalization:  No - Comment as needed Significant Relationships:  Siblings, Parents Lives with:    Do you feel safe going back to the place where you live?  No (fall risk with no supervision/assistance) Need for family participation in patient care:  No (Coment)  Care giving concerns:  No caregivers present at time of this assessment.   Social Worker assessment / plan:  Patient is from home with elderly father who has been attempting to care for patient.  Patient is OBS status and was sent to [facility] after a mechanical fall at home.  No fractures reported, no surgeries needed.  Patient is not medically stable at this point due to increased INR.  Patient is expected to discharge 10/21/2014.  Patient states he does not feel safe going home because he has "had too many falls".  Patient states his supports are his brother and his father. Patient is agreeable to SNF.  CSW presented bed offers and patient has chosen [facility].  Patient states he will update his brother.  Employment status:  Retired Health and safety inspector:  Teacher, English as a foreign language ([employer] not [employer]) PT Recommendations:  Not assessed at this time (OT recs SNF/Supervision assistance) Information / Referral to community resources:  Skilled Nursing  Facility  Patient/Family's Response to care:  Patient is agreeable to [facility].  Patient/Family's Understanding of and Emotional Response to Diagnosis, Current Treatment, and Prognosis:  Patient is realistic regarding necessary therapies and needs at time of discharge.  Emotional Assessment Appearance:  Appears stated age Attitude/Demeanor/Rapport:   (appropriate) Affect (typically observed):  Accepting, Adaptable Orientation:  Oriented to Self, Oriented to Place, Oriented to  Time, Oriented to Situation Alcohol / Substance use:  Not Applicable Psych involvement (Current and /or in the community):  No (Comment)  Discharge Needs  Concerns to be addressed:  Lack of Support Readmission within the last 30 days:  No Current discharge risk:  None Barriers to Discharge:  English as a second language teacher, Continued Medical Work up  Patient has [employer] (not Public [employer]) which requires an authorization prior to discharge.  Authorization was initiated by SNF; however PT has not evaluated patient for recommendations which is needed for authorization.  Rondel Baton, LCSW 10/21/2015, 2:25 PM

## 2015-10-22 DIAGNOSIS — R5381 Other malaise: Secondary | ICD-10-CM | POA: Diagnosis not present

## 2015-10-22 DIAGNOSIS — I10 Essential (primary) hypertension: Secondary | ICD-10-CM | POA: Diagnosis not present

## 2015-10-22 DIAGNOSIS — R296 Repeated falls: Secondary | ICD-10-CM | POA: Diagnosis not present

## 2015-10-22 LAB — BASIC METABOLIC PANEL
Anion gap: 5 (ref 5–15)
BUN: 31 mg/dL — ABNORMAL HIGH (ref 6–20)
CHLORIDE: 111 mmol/L (ref 101–111)
CO2: 27 mmol/L (ref 22–32)
CREATININE: 1.51 mg/dL — AB (ref 0.61–1.24)
Calcium: 8 mg/dL — ABNORMAL LOW (ref 8.9–10.3)
GFR calc non Af Amer: 46 mL/min — ABNORMAL LOW (ref >60)
GFR, EST AFRICAN AMERICAN: 53 mL/min — AB (ref >60)
Glucose, Bld: 115 mg/dL — ABNORMAL HIGH (ref 65–99)
Potassium: 3.8 mmol/L (ref 3.5–5.1)
SODIUM: 143 mmol/L (ref 135–145)

## 2015-10-22 LAB — CBC
HCT: 34 % — ABNORMAL LOW (ref 39.0–52.0)
HEMOGLOBIN: 10.9 g/dL — AB (ref 13.0–17.0)
MCH: 33.3 pg (ref 26.0–34.0)
MCHC: 32.1 g/dL (ref 30.0–36.0)
MCV: 104 fL — AB (ref 78.0–100.0)
Platelets: 116 10*3/uL — ABNORMAL LOW (ref 150–400)
RBC: 3.27 MIL/uL — AB (ref 4.22–5.81)
RDW: 16 % — ABNORMAL HIGH (ref 11.5–15.5)
WBC: 3.6 10*3/uL — ABNORMAL LOW (ref 4.0–10.5)

## 2015-10-22 LAB — PROTIME-INR
INR: 6.05 (ref 0.00–1.49)
PROTHROMBIN TIME: 51.9 s — AB (ref 11.6–15.2)

## 2015-10-22 MED ORDER — MUPIROCIN 2 % EX OINT
1.0000 "application " | TOPICAL_OINTMENT | Freq: Two times a day (BID) | CUTANEOUS | Status: DC
  Administered 2015-10-22 – 2015-10-25 (×7): 1 via NASAL
  Filled 2015-10-22: qty 22

## 2015-10-22 MED ORDER — CHLORHEXIDINE GLUCONATE CLOTH 2 % EX PADS
6.0000 | MEDICATED_PAD | Freq: Every day | CUTANEOUS | Status: DC
  Administered 2015-10-22 – 2015-10-25 (×3): 6 via TOPICAL

## 2015-10-22 MED ORDER — PHYTONADIONE 5 MG PO TABS
2.5000 mg | ORAL_TABLET | Freq: Once | ORAL | Status: AC
  Administered 2015-10-22: 2.5 mg via ORAL
  Filled 2015-10-22: qty 1

## 2015-10-22 MED ORDER — FUROSEMIDE 40 MG PO TABS: 40.0000 mg | ORAL_TABLET | Freq: Every day | ORAL | Status: DC

## 2015-10-22 NOTE — Progress Notes (Signed)
ANTICOAGULATION CONSULT NOTE - Initial Consult  Pharmacy Consult for Warfarin  Indication: atrial fibrillation  No Known Allergies  Labs:  Recent Labs  10/20/15 1115 10/20/15 2040 10/21/15 0522 10/22/15 0417  HGB 13.6  --   --  10.9*  HCT 42.9  --   --  34.0*  PLT 139*  --   --  116*  LABPROT  --  55.5* 63.4* 51.9*  INR  --  6.61* 7.90* 6.05*  CREATININE 1.18  --  1.18 1.51*  CKTOTAL 96  --  86  --     Assessment: 70 y/o M admitted 10/20/15 after a mechanical fall. On warfarin PTA for afib with last dose on 10/19/15. INR 6.05 this morning. CBC stable with no bleeding reported.   Goal of Therapy:  INR 2-3 Monitor platelets by anticoagulation protocol: Yes   Plan:  1. Continue to hold warfarin for now; resume when INR ~ 3 2. Daily PT/INR 3. Monitor for sxs of bleeding  Pollyann Samples, PharmD, BCPS 10/22/2015, 10:13 AM Pager: [phone]

## 2015-10-22 NOTE — Progress Notes (Signed)
CRITICAL VALUE ALERT  Critical value received:   INR 6.05  Date of notification:  10/22/2015  Time of notification:  610  Critical value read back:Yes.    Nurse who received alert:  Lucretia Roers RN  MD notified (1st page):  [facility]  Time of first page:  615  MD notified (2nd page):  Time of second page:  Responding MD:  none  Time MD responded:  None; just made NP on call aware

## 2015-10-22 NOTE — Clinical Social Work Note (Signed)
CSW continuing to follow.  PT evaluation pending therapeutic improvement of INR.  Patient has [employer] - not [employer] and will require an authorization that can only be reviewed after PT evaluation.  Disposition: [facility] SNF pending medical stability Barrier: INR non-therapeutic prohibiting PT evaluation prohibiting insurance authorization review  Vickii Penna, LCSW [phone]  5N1-9; 2S 15-16 and Hospital Psychiatric Service Line Licensed Clinical Social Worker

## 2015-10-22 NOTE — Progress Notes (Signed)
[facility] PROGRESS NOTE  NADEN DOOLEN WCB:[id number] DOB: 03-27-1946 DOA: 10/20/2015 PCP: [name], MD  Assessment/Plan: 1. Recurrent Falls: From deconditioning.  PT eval in am, when INR comes less than 7.  Normal CK levls.    2. Hyperkalemia: resolved.    Hypertension: Controlled.    Chronic diastolic heart failure.  Because of his pedal edema, we will resume IV lasix till discharge.   Atrial fibrillation: Rate controlled. Supra therapeutic iNR.  - no obvious signs of bleeding. Small dose of vitamin K will be given today, so the INR improves and we can start working with PT.  Monitor INR in am.    Code Status: DNR. Family Communication: none at bedside.  Disposition Plan: pending.    Consultants: none Procedures:  none  Antibiotics:  none  HPI/Subjective: Pain in legs, constipated.   Objective: Filed Vitals:   10/22/15 0915 10/22/15 1256  BP: 105/60 95/61  Pulse:  50  Temp:  97.4 F (36.3 C)  Resp:  18    Intake/Output Summary (Last 24 hours) at 10/22/15 1554 Last data filed at 10/22/15 1300  Gross per 24 hour  Intake    960 ml  Output    600 ml  Net    360 ml   Filed Weights   10/20/15 1053 10/21/15 0439  Weight: 151.955 kg (335 lb) 155.1 kg (341 lb 14.9 oz)    Exam:   General:  Alert afebrile comfortable.   Cardiovascular: s1s2  Respiratory: ctab  Abdomen: soft non tender non distended bowel sounds heard  Musculoskeletal: 3+ pedal edema.   Data Reviewed: Basic Metabolic Panel:  Recent Labs Lab 10/20/15 1115 10/21/15 0522 10/22/15 0417  NA 143 146* 143  K 5.6* 4.5 3.8  CL 108 112* 111  CO2 20* 24 27  GLUCOSE 71 81 115*  BUN 27* 27* 31*  CREATININE 1.18 1.18 1.51*  CALCIUM 9.1 8.4* 8.0*   Liver Function Tests:  Recent Labs Lab 10/20/15 1115  AST 40  ALT 20  ALKPHOS 153*  BILITOT 2.3*  PROT 7.7  ALBUMIN 3.8   No results for input(s): LIPASE, AMYLASE in the last 168 hours. No results for  input(s): AMMONIA in the last 168 hours. CBC:  Recent Labs Lab 10/20/15 1115 10/22/15 0417  WBC 5.4 3.6*  NEUTROABS 4.4  --   HGB 13.6 10.9*  HCT 42.9 34.0*  MCV 105.1* 104.0*  PLT 139* 116*   Cardiac Enzymes:  Recent Labs Lab 10/20/15 1115 10/21/15 0522  CKTOTAL 96 86   BNP (last 3 results)  Recent Labs  02/06/15 0838  BNP 1119.2*    ProBNP (last 3 results) No results for input(s): PROBNP in the last 8760 hours.  CBG: No results for input(s): GLUCAP in the last 168 hours.  Recent Results (from the past 240 hour(s))  MRSA PCR Screening     Status: Abnormal   Collection Time: 10/20/15 11:39 PM  Result Value Ref Range Status   MRSA by PCR POSITIVE (A) NEGATIVE Final    Comment:        The GeneXpert MRSA Assay (FDA approved for NASAL specimens only), is one component of a comprehensive MRSA colonization surveillance program. It is not intended to diagnose MRSA infection nor to guide or monitor treatment for MRSA infections. RESULT CALLED TO, READ BACK BY AND VERIFIED WITH: STEPHENS,A RN [phone] 10/21/15 MITCHELL,L      Studies: No results found.  Scheduled Meds: . carvedilol  6.25 mg Oral BID WC  .  Chlorhexidine Gluconate Cloth  6 each Topical Q0600  . colchicine  0.6 mg Oral Daily  . feeding supplement (PRO-STAT SUGAR FREE 64)  30 mL Oral BID  . irbesartan  300 mg Oral Daily  . mupirocin ointment  1 application Nasal BID  . phytonadione  2.5 mg Oral Once  . polyethylene glycol  17 g Oral Daily  . pravastatin  10 mg Oral q1800  . senna-docusate  2 tablet Oral BID   Continuous Infusions:   Principal Problem:   Recurrent falls Active Problems:   Essential hypertension   Atrial fibrillation (HCC)   Diastolic dysfunction with acute on chronic heart failure [facility])   Physical deconditioning   Hyperkalemia    Time spent: 25 min    [facility]  [facility] Pager [phone]. If 7PM-7AM, please contact night-coverage at [email or web address],  password [facility] 10/22/2015, 3:54 PM

## 2015-10-22 NOTE — Progress Notes (Signed)
PT Cancellation Note  Patient Details Name: [name] MRN: [id number] DOB: April 25, 1946   Cancelled Treatment:    Reason Eval/Treat Not Completed: Medical issues which prohibited therapy (pt with INR >6 and not medically appropriate for mobility at this time per MD note and dept protocol. Will attempt next date)   Delorse Lek 10/22/2015, 8:26 AM Delaney Meigs, PT [phone]

## 2015-10-23 ENCOUNTER — Observation Stay (HOSPITAL_COMMUNITY): Payer: Medicare HMO

## 2015-10-23 DIAGNOSIS — I5033 Acute on chronic diastolic (congestive) heart failure: Secondary | ICD-10-CM | POA: Diagnosis present

## 2015-10-23 DIAGNOSIS — R5381 Other malaise: Secondary | ICD-10-CM

## 2015-10-23 DIAGNOSIS — N179 Acute kidney failure, unspecified: Secondary | ICD-10-CM

## 2015-10-23 DIAGNOSIS — I480 Paroxysmal atrial fibrillation: Secondary | ICD-10-CM

## 2015-10-23 DIAGNOSIS — R296 Repeated falls: Secondary | ICD-10-CM

## 2015-10-23 DIAGNOSIS — D61818 Other pancytopenia: Secondary | ICD-10-CM

## 2015-10-23 DIAGNOSIS — Z7901 Long term (current) use of anticoagulants: Secondary | ICD-10-CM | POA: Diagnosis not present

## 2015-10-23 DIAGNOSIS — R601 Generalized edema: Secondary | ICD-10-CM

## 2015-10-23 DIAGNOSIS — I452 Bifascicular block: Secondary | ICD-10-CM | POA: Diagnosis present

## 2015-10-23 DIAGNOSIS — I482 Chronic atrial fibrillation: Secondary | ICD-10-CM

## 2015-10-23 DIAGNOSIS — I10 Essential (primary) hypertension: Secondary | ICD-10-CM

## 2015-10-23 DIAGNOSIS — Z8679 Personal history of other diseases of the circulatory system: Secondary | ICD-10-CM

## 2015-10-23 LAB — BASIC METABOLIC PANEL
Anion gap: 7 (ref 5–15)
BUN: 38 mg/dL — AB (ref 6–20)
CALCIUM: 8.3 mg/dL — AB (ref 8.9–10.3)
CO2: 25 mmol/L (ref 22–32)
Chloride: 109 mmol/L (ref 101–111)
Creatinine, Ser: 1.8 mg/dL — ABNORMAL HIGH (ref 0.61–1.24)
GFR calc Af Amer: 43 mL/min — ABNORMAL LOW (ref >60)
GFR, EST NON AFRICAN AMERICAN: 37 mL/min — AB (ref >60)
GLUCOSE: 104 mg/dL — AB (ref 65–99)
POTASSIUM: 4.4 mmol/L (ref 3.5–5.1)
Sodium: 141 mmol/L (ref 135–145)

## 2015-10-23 LAB — PROTIME-INR
INR: 4.26 — AB (ref 0.00–1.49)
PROTHROMBIN TIME: 39.8 s — AB (ref 11.6–15.2)

## 2015-10-23 MED ORDER — IRBESARTAN 300 MG PO TABS: 300.0000 mg | ORAL_TABLET | Freq: Every day | ORAL | Status: DC

## 2015-10-23 MED ORDER — CARVEDILOL 3.125 MG PO TABS
3.1250 mg | ORAL_TABLET | Freq: Two times a day (BID) | ORAL | Status: DC
  Administered 2015-10-24 – 2015-10-25 (×2): 3.125 mg via ORAL
  Filled 2015-10-23 (×3): qty 1

## 2015-10-23 MED ORDER — FUROSEMIDE 10 MG/ML IJ SOLN: 60.0000 mg | Freq: Two times a day (BID) | INTRAMUSCULAR | Status: DC

## 2015-10-23 MED ORDER — FUROSEMIDE 10 MG/ML IJ SOLN
INTRAMUSCULAR | Status: AC
  Filled 2015-10-23: qty 2

## 2015-10-23 MED ORDER — FUROSEMIDE 10 MG/ML IJ SOLN
INTRAMUSCULAR | Status: AC
  Filled 2015-10-23: qty 4

## 2015-10-23 MED ORDER — FUROSEMIDE 40 MG PO TABS: 40.0000 mg | ORAL_TABLET | Freq: Every day | ORAL | Status: DC

## 2015-10-23 NOTE — Progress Notes (Addendum)
PT Cancellation Note  Patient Details Name: [name] MRN: [id number] DOB: [birth year]   Cancelled Treatment:    Reason Eval/Treat Not Completed: Patient at procedure or test/unavailable   Attempted to see [name] for PT eval, however he was being taken to Radiology; Per RN, he has more tests scheduled for today as well;   He remains on our list to see for eval today;   Will follow up later today as time and test schedule allows;  Otherwise, will follow up for PT tomorrow;   Thank you,  Van Clines, PT  Acute Rehabilitation Services Pager [phone] Office [phone]     Van Clines [facility] 10/23/2015, 10:40 AM

## 2015-10-23 NOTE — Progress Notes (Signed)
Pt BP manual 88/46 sitting at edge of bed with PT. Pt was light headed upon sitting to edge of bed.  Pt BP manual lying prior to getting up 102/67. Rept to Dr. Bennie Pierini. Order received to recheck pt's BP after he returns to bed and if systolic BP greater than 100, proceed with Lasix 60 mg IVP. Will continue to monitor.

## 2015-10-23 NOTE — Progress Notes (Signed)
PROGRESS NOTE    [name] BMW:[id number] DOB: May 24, 1946 DOA: 10/20/2015 PCP: [name], MD  Primary Cardiologist: Dr. [name]  HPI/Brief narrative 70 year old male with history of A. fib, chronic diastolic CHF, HTN, NSTEMI, nonischemic cardiomyopathy, morbid obesity, anemia, GERD, lives with his father, ambulates with the help of a cane, claims compliance with all his medications, admitted to [facility] on 10/20/15 following fall at home and laying on floor for 22 hours, frequent falls. States that his baseline weight is 312 pounds (344 on 1/11). Admitted for further evaluation and management.  Assessment/Plan:   Anasarca/acute on chronic diastolic CHF/?Cor Pulmonale - Baseline weight said to be 312 pounds. Weight today 1/11:344 pounds. - Received IV Lasix 40 mg every 12 hours on 1/8 and 1/9. No Lasix on 1/10. Supposed to have started oral Lasix today. Planned to give IV Lasix 60 mg BID today but hypotension limiting this-Lasix discontinued. - Cardiology consulted for assistance. - 2-D echo 02/07/15: LVEF 55-60 percent and moderate pulmonary hypertension.  Hypotension/essential hypertension - Discontinue ARB, especially given her worsening creatinine/acute kidney injury. Reduced dose of carvedilol with holding parameters. Monitor closely.  PAF - Clinically seems to be in sinus rhythm. Place on telemetry. Reduced dose of carvedilol due to hypotension - INR supratherapeutic-Coumadin being managed by pharmacy.  Acute kidney injury - May be multifactorial: CHF with poor perfusion, ATN related to hypotension and ARB - Discontinue ARB. Reduced dose of carvedilol. Lasix held. Follow BMP in a.m.  Mild hyperkalemia on admission - Resolved.  Hyperlipidemia - Statins  Frequent falls - Probably multifactorial secondary to increased weight, gait instability & deconditioning. No rhabdomyolysis on admission/CK normal. - PT evaluation pending. - Due to history of fall, bruising over  both hips on exam and right groin pain, obtained x-ray of pelvis and bilateral hips: No fractures.  Pancytopenia - Unclear etiology. Follow CBC in a.m.    DVT prophylaxis: INR supratherapeutic Code Status: DO NOT RESUSCITATE Family Communication: None at bedside Disposition Plan: To be determined. Not medically stable for discharge.   Consultants:  Cardiology  Procedures:  None  Antimicrobials:  None   Subjective: Complained of right groin pain. Denies chest pain or dyspnea. Increased weight gain.  Objective: Filed Vitals:   10/23/15 0638 10/23/15 0700 10/23/15 1123 10/23/15 1258  BP: 92/53  102/62 90/46  Pulse: 61     Temp: 97.5 F (36.4 C)     TempSrc:      Resp: 18     Height:      Weight:  156.4 kg (344 lb 12.8 oz)    SpO2: 92%       Intake/Output Summary (Last 24 hours) at 10/23/15 1338 Last data filed at 10/23/15 0731  Gross per 24 hour  Intake      0 ml  Output    150 ml  Net   -150 ml   Filed Weights   10/21/15 0439 10/22/15 2100 10/23/15 0700  Weight: 155.1 kg (341 lb 14.9 oz) 155.8 kg (343 lb 7.6 oz) 156.4 kg (344 lb 12.8 oz)    Exam:  General exam: Moderately built and morbidly obese male lying comfortably propped up in bed. Respiratory system: Diminished breath sounds in the bases but otherwise clear to auscultation. No increased work of breathing. Cardiovascular system: S1 & S2 heard, RRR. No JVD, murmurs, gallops, clicks. Pitting 3+ bilateral lower extremity edema extending up to anterior abdominal wall and lower back. Chronic edema changes of both legs. Gastrointestinal system: Abdomen is nondistended/obese, soft  and nontender. Normal bowel sounds heard. Central nervous system: Alert and oriented. No focal neurological deficits. Extremities: Symmetric 5 x 5 power. Superficial bruising over both hip regions.   Data Reviewed: Basic Metabolic Panel:  Recent Labs Lab 10/20/15 1115 10/21/15 0522 10/22/15 0417 10/23/15 1130  NA 143  146* 143 141  K 5.6* 4.5 3.8 4.4  CL 108 112* 111 109  CO2 20* GLUCOSE 71 81 115* 104*  BUN 27* 27* 31* 38*  CREATININE 1.18 1.18 1.51* 1.80*  CALCIUM 9.1 8.4* 8.0* 8.3*   Liver Function Tests:  Recent Labs Lab 10/20/15 1115  AST 40  ALT 20  ALKPHOS 153*  BILITOT 2.3*  PROT 7.7  ALBUMIN 3.8   No results for input(s): LIPASE, AMYLASE in the last 168 hours. No results for input(s): AMMONIA in the last 168 hours. CBC:  Recent Labs Lab 10/20/15 1115 10/22/15 0417  WBC 5.4 3.6*  NEUTROABS 4.4  --   HGB 13.6 10.9*  HCT 42.9 34.0*  MCV 105.1* 104.0*  PLT 139* 116*   Cardiac Enzymes:  Recent Labs Lab 10/20/15 1115 10/21/15 0522  CKTOTAL 96 86   BNP (last 3 results) No results for input(s): PROBNP in the last 8760 hours. CBG: No results for input(s): GLUCAP in the last 168 hours.  Recent Results (from the past 240 hour(s))  MRSA PCR Screening     Status: Abnormal   Collection Time: 10/20/15 11:39 PM  Result Value Ref Range Status   MRSA by PCR POSITIVE (A) NEGATIVE Final    Comment:        The GeneXpert MRSA Assay (FDA approved for NASAL specimens only), is one component of a comprehensive MRSA colonization surveillance program. It is not intended to diagnose MRSA infection nor to guide or monitor treatment for MRSA infections. RESULT CALLED TO, READ BACK BY AND VERIFIED WITH: STEPHENS,A RN [phone] 10/21/15 MITCHELL,L          Studies: Dg Hips Bilat With Pelvis 3-4 Views  10/23/2015  CLINICAL DATA:  Status post fall 10/19/2015 with continued right hip pain. Initial encounter. EXAM: DG HIP (WITH OR WITHOUT PELVIS) 3-4V BILAT COMPARISON:  None. FINDINGS: No acute bony or joint abnormality is seen on the right or left. Mild to moderate bilateral hip osteoarthritis appears worse on the right. Lower lumbar spondylosis is partially visualized. IMPRESSION: No acute abnormality. Right worse than left hip osteoarthritis. Lower lumbar spondylosis.  Electronically Signed   By: Drusilla Kanner M.D.   On: 10/23/2015 11:00        Scheduled Meds: . carvedilol  3.125 mg Oral BID WC  . Chlorhexidine Gluconate Cloth  6 each Topical Q0600  . colchicine  0.6 mg Oral Daily  . feeding supplement (PRO-STAT SUGAR FREE 64)  30 mL Oral BID  . furosemide      . furosemide      . mupirocin ointment  1 application Nasal BID  . polyethylene glycol  17 g Oral Daily  . pravastatin  10 mg Oral q1800  . senna-docusate  2 tablet Oral BID   Continuous Infusions:   Principal Problem:   Recurrent falls Active Problems:   Essential hypertension   Atrial fibrillation (HCC)   Diastolic dysfunction with acute on chronic heart failure (HCC)   Physical deconditioning   Hyperkalemia    Time spent: 45 minutes.    Marcellus Scott, MD, FACP, FHM. [facility] Pager [phone]  If 7PM-7AM, please contact night-coverage [email or web address] Password [facility] 10/23/2015, 1:38  PM

## 2015-10-23 NOTE — Evaluation (Signed)
Physical Therapy Evaluation Patient Details Name: [name] MRN: [id number] DOB: 02/17/1946 Today's Date: 10/23/2015   History of Present Illness  70 y.o. male admitted after a fall on 71/7/17 and was unable to get himself up off the floor. PMH sigificant for cardiomyopathy. CHF, alcoholism, afib, HTN, morbid obesity, ventricular tachycardia, anemia, hypoxemia, and GERD.  Clinical Impression   Pt admitted with above diagnosis. Pt currently with functional limitations due to the deficits listed below (see PT Problem List). Pt will benefit from skilled PT to increase their independence and safety with mobility to allow discharge to the venue listed below.       Follow Up Recommendations SNF    Equipment Recommendations  Rolling walker with 5" wheels;3in1 (PT) (Bariatric)    Recommendations for Other Services       Precautions / Restrictions Precautions Precautions: Fall      Mobility  Bed Mobility Overal bed mobility: Needs Assistance Bed Mobility: Supine to Sit;Sit to Supine     Supine to sit: Mod assist;Max assist;+2 for physical assistance;+2 for safety/equipment Sit to supine: +2 for physical assistance;Mod assist   General bed mobility comments: Cues for technique and heavy use of bed pad to square off hips at EOB; Mod assist to elevate trunk to sitting; once up, noted difficulty with weight shifting to unwegh both R and L hips for reciprical scooting; max assist and use of bed pad to square off hips; cues and assist to lift LEs into bed  Transfers Overall transfer level: Needs assistance Equipment used: Rolling walker (2 wheeled) Transfers: Sit to/from Stand Sit to Stand: Mod assist;+2 physical assistance;+2 safety/equipment         General transfer comment: Elevated bed surface and pt able to push to stand with bilateral shoulder girdle and trunk support of 2; light mod assist to power up; cues for weight shift anteriorly and  initiation  Ambulation/Gait Ambulation/Gait assistance: +2 safety/equipment;Min guard Ambulation Distance (Feet):  (March in place at [facility] (2-Rh) and sidesteps to [facility]) Assistive device: Rolling walker (2 wheeled)       General Gait Details: marched in place beside bed with RW; decr step height; Pt seemed pleased with his ability to take steps  Stairs            Wheelchair Mobility    Modified Rankin (Stroke Patients Only)       Balance                                             Pertinent Vitals/Pain Pain Assessment: Faces Faces Pain Scale: Hurts a little bit Pain Location: Right hip Pain Descriptors / Indicators: Aching Pain Intervention(s): Monitored during session;Repositioned    Home Living Family/patient expects to be discharged to:: Private residence Living Arrangements: Parent (34 y.o. father) Available Help at Discharge: Family;Available PRN/intermittently Type of Home: House Home Access: Stairs to enter   Entrance Stairs-Number of Steps: 1 Home Layout: One level Home Equipment: Walker - 2 wheels;Cane - single point;Bedside commode;Shower seat;Grab bars - toilet;Grab bars - tub/shower;Adaptive equipment;Hospital bed Additional Comments: Expects to be d/c to SNF for rehab    Prior Function Level of Independence: Independent with assistive device(s)         Comments: Uses RW or SPC for mobility; does all driving, grocery shopping, cooking and cleaning     Hand Dominance   Dominant Hand: Left  Extremity/Trunk Assessment   Upper Extremity Assessment: Defer to OT evaluation           Lower Extremity Assessment: Generalized weakness (with hip motion limited by body habitus)      Cervical / Trunk Assessment: Kyphotic  Communication   Communication: No difficulties  Cognition Arousal/Alertness: Awake/alert Behavior During Therapy: WFL for tasks assessed/performed Overall Cognitive Status: Within Functional Limits for tasks  assessed                      General Comments General comments (skin integrity, edema, etc.): See Vitals Flowsheet for Orthostatic BPs and other vitals taken during session    Exercises        Assessment/Plan    PT Assessment Patient needs continued PT services  PT Diagnosis Generalized weakness;Difficulty walking   PT Problem List Decreased strength;Decreased range of motion;Decreased activity tolerance;Decreased balance;Decreased mobility;Decreased coordination;Decreased knowledge of use of DME;Pain;Cardiopulmonary status limiting activity;Decreased knowledge of precautions  PT Treatment Interventions DME instruction;Gait training;Functional mobility training;Therapeutic activities;Therapeutic exercise;Balance training;Patient/family education   PT Goals (Current goals can be found in the Care Plan section) Acute Rehab PT Goals Patient Stated Goal: to go to rehab to get stronger and then go home PT Goal Formulation: With patient Time For Goal Achievement: 11/06/15 Potential to Achieve Goals: Good    Frequency Min 3X/week   Barriers to discharge        Co-evaluation               End of Session Equipment Utilized During Treatment: Gait belt Activity Tolerance: Patient tolerated treatment well Patient left: in bed;with call bell/phone within reach Nurse Communication: Mobility status (Vitals assessed with activity)    Functional Assessment Tool Used: Clinical Judgement Functional Limitation: Mobility: Walking and moving around Mobility: Walking and Moving Around Current Status [phone]): At least 40 percent but less than 60 percent impaired, limited or restricted Mobility: Walking and Moving Around Goal Status [phone]): At least 1 percent but less than 20 percent impaired, limited or restricted    Time: 1119-1200 PT Time Calculation (min) (ACUTE ONLY): 41 min   Charges:   PT Evaluation $PT Eval High Complexity: 1 Procedure PT Treatments $Gait Training:  8-22 mins $Therapeutic Activity: 8-22 mins   PT G Codes:   PT G-Codes **NOT FOR INPATIENT CLASS** Functional Assessment Tool Used: Clinical Judgement Functional Limitation: Mobility: Walking and moving around Mobility: Walking and Moving Around Current Status ([id number]): At least 40 percent but less than 60 percent impaired, limited or restricted Mobility: Walking and Moving Around Goal Status [phone]): At least 1 percent but less than 20 percent impaired, limited or restricted    Van Clines [facility] 10/23/2015, 1:04 PM  Van Clines, PT  Acute Rehabilitation Services Pager [phone] Office [phone]

## 2015-10-23 NOTE — Progress Notes (Signed)
Pt BP retaken manual after returning to bed. BP 90/46. BP and today's BMET result text paged to Dr. Bennie Pierini. Orders received. Will continue to monitor.

## 2015-10-23 NOTE — Consult Note (Signed)
Reason for Consult:   CHF  Requesting Physician: [facility] Primary Cardiologist Dr Antoine Poche  HPI:   Morbidly obese, deconditioned and sedentary 71 y/o male- lives at home with his 48 y/o father- admitted 10/20/15 after a fall in his kitchen. The pt's father was in the hospital at the time and the pt was on the floor x 20 hrs. He has a history of prior NICM- last EF 55% with grade 3 DD April 2016 (admitted for fall then as well). He has CAF-on Coumadin, HTN, HCVD, and morbid obesity (BMI 45). He says he has gained 40 lbs over the last several months and this is confirmed comparing his last OV wgt in Sept to this adm-294lbs in Sept, 335 lbs when admitted here. He admits he eats frozen pizzas and such. He has been compliant with his medications. He was treated for CHF on admission with lasix but his BUN/SCr rose and his B/P dropped. He is comfortable relatively flat in bed when I examined him.   PMHx:  Past Medical History  Diagnosis Date  . Cardiomyopathy     nonischemic  . CHF (congestive heart failure) (HCC)     ejection fractio of 45% per echo in 2011 (previos EF 35-40% in 2003), echo 04/2013  -LVEF 50-55%.  . Alcoholism (HCC)   . Atrial fibrillation or flutter   . Hypertension   . Obesity   . Ventricular tachycardia (HCC)     nonsustained, asymptomatic  . Anemia   . Hypoxemia   . GERD (gastroesophageal reflux disease)     takes tums prn  . Complication of anesthesia     April 11, 2013    Past Surgical History  Procedure Laterality Date  . Tonsillectomy    . Knee bursectomy Right 04/20/2013    Procedure: Right knee prepatella bursa decompression;  Surgeon: Cammy Copa, MD;  Location: [facility] OR;  Service: Orthopedics;  Laterality: Right;  . I&d extremity Right 06/28/2013    Procedure: IRRIGATION AND DEBRIDEMENT OF RIGHT LEG WITH SURGICAL PREP/PLACEMENT OF ACELL ;  Surgeon: Wayland Denis, DO;  Location: [facility] OR;  Service: Plastics;  Laterality: Right;    SOCHx:  reports that he has quit smoking. He started smoking about 28 years ago. He has never used smokeless tobacco. He reports that he drinks about 6.0 oz of alcohol per week. He reports that he does not use illicit drugs.  FAMHx: Family History  Problem Relation Age of Onset  . COPD Mother   . Hypertension Father     ALLERGIES: No Known Allergies  ROS: Review of Systems: General: negative for chills, fever, night sweats or weight changes.  Cardiovascular: negative for chest pain, palpitations, Chronic LE edema HEENT: negative for any visual disturbances, blindness, glaucoma Dermatological: negative for rash  Respiratory: negative for cough, hemoptysis, or wheezing Urologic: negative for hematuria or dysuria Abdominal: negative for nausea, vomiting, diarrhea, bright red blood per rectum, melena, or hematemesis Neurologic: negative for visual changes, syncope, or dizziness Musculoskeletal: negative for back pain, joint pain, or swelling, Uses a walker to ambulate Psych: cooperative and appropriate All other systems reviewed and are otherwise negative except as noted above.   HOME MEDICATIONS: Prior to Admission medications   Medication Sig Start Date End Date Taking? Authorizing Provider  carvedilol (COREG) 6.25 MG tablet Take 6.25 mg by mouth 2 (two) times daily with a meal.   Yes Historical Provider, MD  colchicine 0.6 MG tablet Take 0.6 mg by  mouth daily. colcrys   Yes Historical Provider, MD  Cyanocobalamin (VITAMIN B-12 PO) Take 1 tablet by mouth daily.   Yes Historical Provider, MD  ferrous sulfate 325 (65 FE) MG tablet Take 1 tablet by mouth daily.   Yes Historical Provider, MD  furosemide (LASIX) 40 MG tablet Take 1 tablet (40 mg total) by mouth daily. 02/13/15  Yes Nishant Dhungel, MD  olmesartan (BENICAR) 40 MG tablet Take 40 mg by mouth daily.   Yes Historical Provider, MD  potassium chloride SA (K-DUR,KLOR-CON) 20 MEQ tablet Take 1 tablet by mouth 2 (two) times daily. 01/08/15   Yes Historical Provider, MD  pravastatin (PRAVACHOL) 10 MG tablet Take 10 mg by mouth daily.   Yes Historical Provider, MD  vitamin C (ASCORBIC ACID) 500 MG tablet Take 500 mg by mouth daily.   Yes Historical Provider, MD  warfarin (COUMADIN) 5 MG tablet Take 2.5-5 mg by mouth daily with breakfast. Takes a full tablet on MWF and half of a tablet (2.5mg ) on all other days. 11/15/14  Yes Historical Provider, MD    HOSPITAL MEDICATIONS: I have reviewed the patient's current medications.  VITALS: Blood pressure 90/46, pulse 61, temperature 97.5 F (36.4 C), temperature source Oral, resp. rate 18, height 6' (1.829 m), weight 344 lb 12.8 oz (156.4 kg), SpO2 92 %.  PHYSICAL EXAM: General appearance: alert, cooperative, no distress and morbidly obese Neck: no carotid bruit Lungs: clear to auscultation bilaterally Heart: irregularly irregular rhythm Abdomen: obese Extremities: chronic venous skin changes Pulses: diminnished Skin: cool, pale, dry Neurologic: Grossly normal  LABS: Results for orders placed or performed during the hospital encounter of 10/20/15 (from the past 24 hour(s))  Protime-INR     Status: Abnormal   Collection Time: 10/23/15  5:36 AM  Result Value Ref Range   Prothrombin Time 39.8 (H) 11.6 - 15.2 seconds   INR 4.26 (H) 0.00 - 1.49  Basic metabolic panel     Status: Abnormal   Collection Time: 10/23/15 11:30 AM  Result Value Ref Range   Sodium 141 135 - 145 mmol/L   Potassium 4.4 3.5 - 5.1 mmol/L   Chloride 109 101 - 111 mmol/L   CO2 25 22 - 32 mmol/L   Glucose, Bld 104 (H) 65 - 99 mg/dL   BUN 38 (H) 6 - 20 mg/dL   Creatinine, Ser 1.61 (H) 0.61 - 1.24 mg/dL   Calcium 8.3 (L) 8.9 - 10.3 mg/dL   GFR calc non Af Amer 37 (L) >60 mL/min   GFR calc Af Amer 43 (L) >60 mL/min   Anion gap 7 5 - 15    EKG: AF, RBBB, LAFB  IMAGING: Dg Hips Bilat With Pelvis 3-4 Views  10/23/2015  CLINICAL DATA:  Status post fall 10/19/2015 with continued right hip pain. Initial  encounter. EXAM: DG HIP (WITH OR WITHOUT PELVIS) 3-4V BILAT COMPARISON:  None. FINDINGS: No acute bony or joint abnormality is seen on the right or left. Mild to moderate bilateral hip osteoarthritis appears worse on the right. Lower lumbar spondylosis is partially visualized. IMPRESSION: No acute abnormality. Right worse than left hip osteoarthritis. Lower lumbar spondylosis. Electronically Signed   By: Drusilla Kanner M.D.   On: 10/23/2015 11:00    IMPRESSION: Principal Problem:   Recurrent falls Active Problems:   Diastolic dysfunction with acute on chronic heart failure [facility])   Essential hypertension   Chronic atrial fibrillation (HCC)   Chronic anticoagulation   H/O NICM-last Echo EF 55% April 2016   Diastolic  dysfunction-grade 13 January 2015   Physical deconditioning   Morbid obesity - BMI 45   RBBB with LAFB   RECOMMENDATION: Consider low dose Aldactone if B/P and renal allow. . Wgts probably not accurate.   Time Spent Directly with Patient: 40 minutes  Corine Shelter, [place]  [phone] beeper 10/23/2015, 3:01 PM    Agree with note written by Corine Shelter [facility]  Pt admitted after being immobilized on floor for 20 hrs. H/O NISCM with DD, CAF on coumadin AC, morbid obesity with chronic venous stasis. Wgt up 40#. Diuresed on admission with decreased edema and increased SCr. BP soft. He does not appear to be volume overloaded at this point and can lay flat comfortably.EF preserved last echo. No need to repeat. No further eval nec. Needs PT/OT. Add back home dose oral lasix. Fluid restrict. Follow up with Dr. Antoine Poche as OP. Will see again as needed.   Nanetta Batty 10/23/2015 3:55 PM

## 2015-10-23 NOTE — Progress Notes (Signed)
While pt was up with PT, sacral and buttock areas assessed for skin breakdown. Pt noted to have a skin tear to R buttock cheek. Large pink mepilex applied. Will continue to monitor.

## 2015-10-23 NOTE — Progress Notes (Signed)
During AM assessment, pt noted to have moisture associated red rash area under the lower abdominal fold. 4 x4's placed in fold area. Pt noted to have two skin abrasions of left forearm that have 2 pink small mepilexes intact. Pt scrotal area noted to be swollen. Pt noted to have dry flakey skin of BLE with 1+ pitting edema of BLE. Pt noted to have two healed scars of RLE. Lotion applied to BLE and BLE placed on two pillows for pressure reduction and to minimize edema. Will continue to monitor.

## 2015-10-23 NOTE — Progress Notes (Signed)
ANTICOAGULATION CONSULT NOTE - Initial Consult  Pharmacy Consult for Warfarin  Indication: atrial fibrillation  No Known Allergies  Labs:  Recent Labs  10/20/15 1115  10/21/15 0522 10/22/15 0417 10/23/15 0536  HGB 13.6  --   --  10.9*  --   HCT 42.9  --   --  34.0*  --   PLT 139*  --   --  116*  --   LABPROT  --   < > 63.4* 51.9* 39.8*  INR  --   < > 7.90* 6.05* 4.26*  CREATININE 1.18  --  1.18 1.51*  --   CKTOTAL 96  --  86  --   --   < > = values in this interval not displayed.  Assessment: 70 y/o M admitted 10/20/15 after a mechanical fall. On warfarin PTA for afib with last dose on 10/19/15. INR 4.26 this am; s/p 2.5 mg vitamin K po given on 1/10 pm. Most recent CBC stable with no reported bleeding.  Goal of Therapy:  INR 2-3 Monitor platelets by anticoagulation protocol: Yes   Plan:  1. Continue to hold warfarin for now; if stable can likely resume on 1/12 2. Daily INR 3. Monitor for sxs of bleeding  Pollyann Samples, PharmD, BCPS 10/23/2015, 10:37 AM Pager: [phone]

## 2015-10-24 DIAGNOSIS — N179 Acute kidney failure, unspecified: Secondary | ICD-10-CM | POA: Diagnosis not present

## 2015-10-24 DIAGNOSIS — R296 Repeated falls: Secondary | ICD-10-CM | POA: Diagnosis not present

## 2015-10-24 DIAGNOSIS — R601 Generalized edema: Secondary | ICD-10-CM | POA: Diagnosis not present

## 2015-10-24 LAB — URINALYSIS, ROUTINE W REFLEX MICROSCOPIC
Bilirubin Urine: NEGATIVE
Glucose, UA: NEGATIVE mg/dL
HGB URINE DIPSTICK: NEGATIVE
Ketones, ur: NEGATIVE mg/dL
Nitrite: NEGATIVE
PH: 5 (ref 5.0–8.0)
Protein, ur: NEGATIVE mg/dL
SPECIFIC GRAVITY, URINE: 1.013 (ref 1.005–1.030)

## 2015-10-24 LAB — COMPREHENSIVE METABOLIC PANEL
ALBUMIN: 2.8 g/dL — AB (ref 3.5–5.0)
ALT: 15 U/L — AB (ref 17–63)
AST: 26 U/L (ref 15–41)
Alkaline Phosphatase: 112 U/L (ref 38–126)
Anion gap: 6 (ref 5–15)
BUN: 43 mg/dL — AB (ref 6–20)
CHLORIDE: 111 mmol/L (ref 101–111)
CO2: 26 mmol/L (ref 22–32)
Calcium: 8.3 mg/dL — ABNORMAL LOW (ref 8.9–10.3)
Creatinine, Ser: 1.96 mg/dL — ABNORMAL HIGH (ref 0.61–1.24)
GFR calc Af Amer: 39 mL/min — ABNORMAL LOW (ref >60)
GFR, EST NON AFRICAN AMERICAN: 33 mL/min — AB (ref >60)
GLUCOSE: 89 mg/dL (ref 65–99)
POTASSIUM: 4.5 mmol/L (ref 3.5–5.1)
SODIUM: 143 mmol/L (ref 135–145)
Total Bilirubin: 2 mg/dL — ABNORMAL HIGH (ref 0.3–1.2)
Total Protein: 6 g/dL — ABNORMAL LOW (ref 6.5–8.1)

## 2015-10-24 LAB — CBC
HCT: 38.1 % — ABNORMAL LOW (ref 39.0–52.0)
Hemoglobin: 12.2 g/dL — ABNORMAL LOW (ref 13.0–17.0)
MCH: 33.5 pg (ref 26.0–34.0)
MCHC: 32 g/dL (ref 30.0–36.0)
MCV: 104.7 fL — ABNORMAL HIGH (ref 78.0–100.0)
PLATELETS: 120 10*3/uL — AB (ref 150–400)
RBC: 3.64 MIL/uL — AB (ref 4.22–5.81)
RDW: 16 % — AB (ref 11.5–15.5)
WBC: 4 10*3/uL (ref 4.0–10.5)

## 2015-10-24 LAB — URINE MICROSCOPIC-ADD ON

## 2015-10-24 LAB — PROTIME-INR
INR: 1.85 — AB (ref 0.00–1.49)
PROTHROMBIN TIME: 21.3 s — AB (ref 11.6–15.2)

## 2015-10-24 MED ORDER — ZOLPIDEM TARTRATE 5 MG PO TABS
5.0000 mg | ORAL_TABLET | Freq: Every evening | ORAL | Status: DC | PRN
  Administered 2015-10-24: 5 mg via ORAL
  Filled 2015-10-24: qty 1

## 2015-10-24 MED ORDER — WARFARIN - PHARMACIST DOSING INPATIENT: Freq: Every day | Status: DC

## 2015-10-24 MED ORDER — WARFARIN SODIUM 7.5 MG PO TABS
7.5000 mg | ORAL_TABLET | Freq: Once | ORAL | Status: AC
  Administered 2015-10-24: 7.5 mg via ORAL
  Filled 2015-10-24: qty 1

## 2015-10-24 MED ORDER — FUROSEMIDE 10 MG/ML IJ SOLN
80.0000 mg | Freq: Once | INTRAMUSCULAR | Status: AC
  Administered 2015-10-24: 80 mg via INTRAVENOUS
  Filled 2015-10-24: qty 8

## 2015-10-24 MED ORDER — ENSURE ENLIVE PO LIQD
237.0000 mL | Freq: Two times a day (BID) | ORAL | Status: DC
  Administered 2015-10-24 – 2015-10-25 (×3): 237 mL via ORAL

## 2015-10-24 NOTE — Progress Notes (Signed)
Nutrition Follow-up  DOCUMENTATION CODES:   Morbid obesity  INTERVENTION:  Provide Ensure Enlive po BID, each supplement provides 350 kcal and 20 grams of protein.  Continue 30 ml Prostat po BID, each supplement provides 100 kcal and 15 grams of protein.   Encourage adequate PO intake.   NUTRITION DIAGNOSIS:   Increased nutrient needs related to chronic illness as evidenced by estimated needs; ongoing  GOAL:   Patient will meet greater than or equal to 90% of their needs; not met  MONITOR:   PO intake, Supplement acceptance, Weight trends, Labs, I & O's  REASON FOR ASSESSMENT:   Consult Assessment of nutrition requirement/status  ASSESSMENT:   Patient comes in after sustaining a mechanical fall being on his kitchen floor for partially 20 hours. Patient was unable to reach a phone on the edge of the counter where he called EMS. Patient states that he was ambulating in his kitchen when the refrigerator door hit him causing him to fall over.  Meal completion has been 25-50%. Pt reports appetite is decreased. Pt currently has Prostat ordered and has been consuming them. RD to continue with orders. RD to additionally order Ensure to aid in caloric and protein needs. Pt does report consuming Ensure/Boost at home at least once daily. Pt was encouraged to eat his food at meals and to drink his supplements.   Labs and medications reviewed.   Diet Order:  Diet Heart Room service appropriate?: Yes; Fluid consistency:: Thin; Fluid restriction:: 1200 mL Fluid  Skin:   (MASD, skin tear on buttocks)  Last BM:  1/9  Height:   Ht Readings from Last 1 Encounters:  10/20/15 6' (1.829 m)    Weight:   Wt Readings from Last 1 Encounters:  10/24/15 344 lb 2.2 oz (156.1 kg)    Ideal Body Weight:  80.9 kg  BMI:  Body mass index is 46.66 kg/(m^2).  Estimated Nutritional Needs:   Kcal:  2100-2400  Protein:  110-130 grams  Fluid:  2.1-2.4 L/day  EDUCATION NEEDS:   No  education needs identified at this time  Corrin Parker, MS, RD, LDN Pager # [phone] After hours/ weekend pager # [phone]

## 2015-10-24 NOTE — Consult Note (Signed)
Reason for Consult: Acute Kidney Injury Referring Physician: Dr. Vernell Leep  Chief Complaint: [place] down  Assessment/Plan: 1. Acute Kidney Injury - unclear etiology but most common cause in this setting would be hemodynamic. He has had relative hypotensive episodes intermittently. Last lasix dose on 1/9 and last Irbesartan on 1/10. He certainly appears to be volume overloaded and will always have edema but currently with anasarca. - I will look at the urine microscopy for signs of ATN --> very bland urine; this is all hyemodynamic. - No nephrotoxic agents currently except Colchicine which can cause neurotoxicity + myopathy in advanced CKD/ESRD. Will hold the colchicine for now even though he has not had diarrhea. - Will give a single dose of Lasix 53m x1 at this time. - Continue holding the Avapro. - Repeat a urinalysis. - Obstruction very low on diagnosis especially with a foley in place. 2. Diastolic heart failure. 3. HTN --> now with relative hypotension. 4. Chronic afib 5. Frequent falls    HPI: [name] an 70y.o. male with a history of CHF LVEF 50-55% secondary to nonischemic cardiomyopathy, atrial fibrillation who presented after being down on the ground for 22 hours. He is morbidly obese but able to get around with the aid of a walker. Unfortunately while he was in the bathroom the fridge door caught him, knocking him to the ground; he was unable to get to any of the 5 phones available in the house for 22 hours. He states that he's on diuretics at home but his swelling in the legs has been getting worse. He was found to have a nl CK with a creatinine at his baseline of 1-1.2 but unfortunately during the hospitalization his creatinine has been increasing since 1/9-1/10. His last dose of Lasix was on 1/9 and last dose of Irbesartan on 1/10. He has had episodes of relative hypotension and been treated with gout with Colchicine 0.626mdaily. But he has not had diarrhea.    ROS Pertinent items are noted in HPI.  Chemistry and CBC: CREATININE, SER  Date/Time Value Ref Range Status  10/24/2015 05:18 AM 1.96* 0.61 - 1.24 mg/dL Final  10/23/2015 11:30 AM 1.80* 0.61 - 1.24 mg/dL Final  10/22/2015 04:17 AM 1.51* 0.61 - 1.24 mg/dL Final  10/21/2015 05:22 AM 1.18 0.61 - 1.24 mg/dL Final  10/20/2015 11:15 AM 1.18 0.61 - 1.24 mg/dL Final  10/14/2015 02:08 AM 1.09 0.61 - 1.24 mg/dL Final  02/12/2015 03:14 PM 0.86 0.61 - 1.24 mg/dL Final  02/11/2015 07:39 PM 1.09 0.61 - 1.24 mg/dL Final  02/11/2015 04:01 AM 1.12 0.61 - 1.24 mg/dL Final  02/10/2015 10:43 AM 1.32* 0.61 - 1.24 mg/dL Final  02/08/2015 05:30 AM 1.76* 0.50 - 1.35 mg/dL Final  02/07/2015 05:08 AM 2.10* 0.50 - 1.35 mg/dL Final  02/06/2015 02:00 PM 1.78* 0.50 - 1.35 mg/dL Final  02/06/2015 08:38 AM 1.65* 0.50 - 1.35 mg/dL Final    Comment:    RESULT REPEATED AND VERIFIED DELTA CHECK NOTED   02/05/2015 06:22 PM 0.90 0.50 - 1.35 mg/dL Final  06/28/2013 11:01 AM 0.68 0.50 - 1.35 mg/dL Final  04/26/2013 10:12 PM 0.95 0.50 - 1.35 mg/dL Final  04/24/2013 03:43 AM 0.91 0.50 - 1.35 mg/dL Final  04/23/2013 05:22 AM 0.83 0.50 - 1.35 mg/dL Final  04/23/2013 03:42 AM 0.85 0.50 - 1.35 mg/dL Final  04/22/2013 07:54 PM 0.83 0.50 - 1.35 mg/dL Final  04/14/2013 04:43 AM 1.05 0.50 - 1.35 mg/dL Final  04/13/2013 05:00 AM 1.19 0.50 -  1.35 mg/dL Final  04/12/2013 03:50 AM 1.63* 0.50 - 1.35 mg/dL Final    Comment:    RESULT REPEATED AND VERIFIED DELTA CHECK NOTED  04/11/2013 09:40 AM 2.64* 0.50 - 1.35 mg/dL Final  05/30/2010 04:05 AM 0.88 0.4 - 1.5 mg/dL Final  05/29/2010 03:50 AM 0.95 0.4 - 1.5 mg/dL Final  05/28/2010 08:43 AM 0.88 0.4 - 1.5 mg/dL Final  05/27/2010 04:05 AM 0.83 0.4 - 1.5 mg/dL Final  05/26/2010 03:19 AM 0.93 DELTA CHECK NOTED 0.4 - 1.5 mg/dL Final  05/24/2010 03:45 AM 1.55* 0.4 - 1.5 mg/dL Final  05/23/2010 04:35 AM 1.62* 0.4 - 1.5 mg/dL Final  05/22/2010 03:55 AM 1.16 0.4 - 1.5 mg/dL Final   05/21/2010 05:50 AM 0.99 0.4 - 1.5 mg/dL Final  05/20/2010 05:14 AM 1.00 0.4 - 1.5 mg/dL Final  05/19/2010 03:39 AM 1.08 0.4 - 1.5 mg/dL Final  05/18/2010 04:04 AM 1.00 0.4 - 1.5 mg/dL Final  05/17/2010 05:00 AM 0.94 0.4 - 1.5 mg/dL Final  05/16/2010 05:56 AM 0.97 0.4 - 1.5 mg/dL Final  05/15/2010 05:00 AM 1.05 0.4 - 1.5 mg/dL Final  05/14/2010 04:20 AM 1.14 0.4 - 1.5 mg/dL Final  05/13/2010 09:15 AM 1.29 0.4 - 1.5 mg/dL Final    Recent Labs Lab 10/20/15 1115 10/21/15 0522 10/22/15 0417 10/23/15 1130 10/24/15 0518  NA 143 146* 143 141 143  K 5.6* 4.5 3.8 4.4 4.5  CL 108 112* 111 109 111  CO2 20* _0 GLUCOSE 71 81 115* 104* 89  BUN 27* 27* 31* 38* 43*  CREATININE 1.18 1.18 1.51* 1.80* 1.96*  CALCIUM 9.1 8.4* 8.0* 8.3* 8.3*    Recent Labs Lab 10/20/15 1115 10/22/15 0417 10/24/15 0518  WBC 5.4 3.6* 4.0  NEUTROABS 4.4  --   --   HGB 13.6 10.9* 12.2*  HCT 42.9 34.0* 38.1*  MCV 105.1* 104.0* 104.7*  PLT 139* 116* 120*   Liver Function Tests:  Recent Labs Lab 10/20/15 1115 10/24/15 0518  AST 40 26  ALT 20 15*  ALKPHOS 153* 112  BILITOT 2.3* 2.0*  PROT 7.7 6.0*  ALBUMIN 3.8 2.8*   No results for input(s): LIPASE, AMYLASE in the last 168 hours. No results for input(s): AMMONIA in the last 168 hours. Cardiac Enzymes:  Recent Labs Lab 10/20/15 1115 10/21/15 0522  CKTOTAL 96 86   Iron Studies: No results for input(s): IRON, TIBC, TRANSFERRIN, FERRITIN in the last 72 hours. PT/INR: _1 (inr:5)  Xrays/Other Studies: ) Results for orders placed or performed during the hospital encounter of 10/20/15 (from the past 48 hour(s))  Protime-INR     Status: Abnormal   Collection Time: 10/23/15  5:36 AM  Result Value Ref Range   Prothrombin Time 39.8 (H) 11.6 - 15.2 seconds   INR 4.26 (H) 0.00 - 7.82  Basic metabolic panel     Status: Abnormal   Collection Time: 10/23/15 11:30 AM  Result Value Ref Range   Sodium 141 135 - 145 mmol/L   Potassium  4.4 3.5 - 5.1 mmol/L   Chloride 109 101 - 111 mmol/L   CO2 25 22 - 32 mmol/L   Glucose, Bld 104 (H) 65 - 99 mg/dL   BUN 38 (H) 6 - 20 mg/dL   Creatinine, Ser 1.80 (H) 0.61 - 1.24 mg/dL   Calcium 8.3 (L) 8.9 - 10.3 mg/dL   GFR calc non Af Amer 37 (L) >60 mL/min   GFR calc Af Amer 43 (L) >60 mL/min    Comment: (  NOTE) The eGFR has been calculated using the CKD EPI equation. This calculation has not been validated in all clinical situations. eGFR's persistently <60 mL/min signify possible Chronic Kidney Disease.    Anion gap 7 5 - 15  Protime-INR     Status: Abnormal   Collection Time: 10/24/15  5:18 AM  Result Value Ref Range   Prothrombin Time 21.3 (H) 11.6 - 15.2 seconds   INR 1.85 (H) 0.00 - 1.49  CBC     Status: Abnormal   Collection Time: 10/24/15  5:18 AM  Result Value Ref Range   WBC 4.0 4.0 - 10.5 K/uL   RBC 3.64 (L) 4.22 - 5.81 MIL/uL   Hemoglobin 12.2 (L) 13.0 - 17.0 g/dL   HCT 38.1 (L) 39.0 - 52.0 %   MCV 104.7 (H) 78.0 - 100.0 fL   MCH 33.5 26.0 - 34.0 pg   MCHC 32.0 30.0 - 36.0 g/dL   RDW 16.0 (H) 11.5 - 15.5 %   Platelets 120 (L) 150 - 400 K/uL  Comprehensive metabolic panel     Status: Abnormal   Collection Time: 10/24/15  5:18 AM  Result Value Ref Range   Sodium 143 135 - 145 mmol/L   Potassium 4.5 3.5 - 5.1 mmol/L   Chloride 111 101 - 111 mmol/L   CO2 26 22 - 32 mmol/L   Glucose, Bld 89 65 - 99 mg/dL   BUN 43 (H) 6 - 20 mg/dL   Creatinine, Ser 1.96 (H) 0.61 - 1.24 mg/dL   Calcium 8.3 (L) 8.9 - 10.3 mg/dL   Total Protein 6.0 (L) 6.5 - 8.1 g/dL   Albumin 2.8 (L) 3.5 - 5.0 g/dL   AST 26 15 - 41 U/L   ALT 15 (L) 17 - 63 U/L   Alkaline Phosphatase 112 38 - 126 U/L   Total Bilirubin 2.0 (H) 0.3 - 1.2 mg/dL   GFR calc non Af Amer 33 (L) >60 mL/min   GFR calc Af Amer 39 (L) >60 mL/min    Comment: (NOTE) The eGFR has been calculated using the CKD EPI equation. This calculation has not been validated in all clinical situations. eGFR's persistently <60 mL/min  signify possible Chronic Kidney Disease.    Anion gap 6 5 - 15   Dg Hips Bilat With Pelvis 3-4 Views  10/23/2015  CLINICAL DATA:  Status post fall 10/19/2015 with continued right hip pain. Initial encounter. EXAM: DG HIP (WITH OR WITHOUT PELVIS) 3-4V BILAT COMPARISON:  None. FINDINGS: No acute bony or joint abnormality is seen on the right or left. Mild to moderate bilateral hip osteoarthritis appears worse on the right. Lower lumbar spondylosis is partially visualized. IMPRESSION: No acute abnormality. Right worse than left hip osteoarthritis. Lower lumbar spondylosis. Electronically Signed   By: Inge Rise M.D.   On: 10/23/2015 11:00    PMH:   Past Medical History  Diagnosis Date  . Cardiomyopathy     nonischemic  . CHF (congestive heart failure) (HCC)     ejection fractio of 45% per echo in 2011 (previos EF 35-40% in 2003), echo 04/2013  -LVEF 50-55%.  . Alcoholism ([place])   . Atrial fibrillation or flutter   . Hypertension   . Obesity   . Ventricular tachycardia (HCC)     nonsustained, asymptomatic  . Anemia   . Hypoxemia   . GERD (gastroesophageal reflux disease)     takes tums prn  . Complication of anesthesia     April 11, 2013  PSH:   Past Surgical History  Procedure Laterality Date  . Tonsillectomy    . Knee bursectomy Right 04/20/2013    Procedure: Right knee prepatella bursa decompression;  Surgeon: Meredith Pel, MD;  Location: [facility];  Service: Orthopedics;  Laterality: Right;  . I&d extremity Right 06/28/2013    Procedure: IRRIGATION AND DEBRIDEMENT OF RIGHT LEG WITH SURGICAL PREP/PLACEMENT OF ACELL ;  Surgeon: Theodoro Kos, DO;  Location: [facility];  Service: Plastics;  Laterality: Right;    Allergies: No Known Allergies  Medications:   Prior to Admission medications   Medication Sig Start Date End Date Taking? Authorizing Provider  carvedilol (COREG) 6.25 MG tablet Take 6.25 mg by mouth 2 (two) times daily with a meal.   Yes Historical Provider, MD   colchicine 0.6 MG tablet Take 0.6 mg by mouth daily. colcrys   Yes Historical Provider, MD  Cyanocobalamin (VITAMIN B-12 PO) Take 1 tablet by mouth daily.   Yes Historical Provider, MD  ferrous sulfate 325 (65 FE) MG tablet Take 1 tablet by mouth daily.   Yes Historical Provider, MD  furosemide (LASIX) 40 MG tablet Take 1 tablet (40 mg total) by mouth daily. 02/13/15  Yes Nishant Dhungel, MD  olmesartan (BENICAR) 40 MG tablet Take 40 mg by mouth daily.   Yes Historical Provider, MD  potassium chloride SA (K-DUR,KLOR-CON) 20 MEQ tablet Take 1 tablet by mouth 2 (two) times daily. 01/08/15  Yes Historical Provider, MD  pravastatin (PRAVACHOL) 10 MG tablet Take 10 mg by mouth daily.   Yes Historical Provider, MD  vitamin C (ASCORBIC ACID) 500 MG tablet Take 500 mg by mouth daily.   Yes Historical Provider, MD  warfarin (COUMADIN) 5 MG tablet Take 2.5-5 mg by mouth daily with breakfast. Takes a full tablet on MWF and half of a tablet (2.12m) on all other days. 11/15/14  Yes Historical Provider, MD    Discontinued Meds:   Medications Discontinued During This Encounter  Medication Reason  . potassium chloride SA (K-DUR,KLOR-CON) CR tablet 20 mEq Lab parameters not met  . carvedilol (COREG) tablet 6.25 mg   . colchicine tablet 0.6 mg   . 0.9 %  sodium chloride infusion   . furosemide (LASIX) injection 40 mg   . 0.9 %  sodium chloride infusion   . furosemide (LASIX) injection 40 mg   . furosemide (LASIX) tablet 40 mg   . irbesartan (AVAPRO) tablet 300 mg   . furosemide (LASIX) tablet 40 mg   . furosemide (LASIX) injection 60 mg   . irbesartan (AVAPRO) tablet 300 mg   . carvedilol (COREG) tablet 6.25 mg   . acetaminophen (TYLENOL) suppository 650 mg   . ondansetron (ZOFRAN) injection 4 mg   . furosemide (LASIX) 10 MG/ML injection Returned to ADS  . furosemide (LASIX) 10 MG/ML injection Returned to ADS    Social History:  reports that he has quit smoking. He started smoking about 28 years ago. He  has never used smokeless tobacco. He reports that he drinks about 6.0 oz of alcohol per week. He reports that he does not use illicit drugs.  Family History:   Family History  Problem Relation Age of Onset  . COPD Mother   . Hypertension Father     Blood pressure 106/59, pulse 64, temperature 98.8 F (37.1 C), temperature source Oral, resp. rate 18, height 6' (1.829 m), weight 156.1 kg (344 lb 2.2 oz), SpO2 93 %. General appearance: alert and appears older than stated age Head:  Normocephalic, without obvious abnormality, atraumatic Eyes: negative Neck: no adenopathy, no carotid bruit, supple, symmetrical, trachea midline and thyroid not enlarged, symmetric, no tenderness/mass/nodules Back: symmetric, no curvature. ROM normal. No CVA tenderness. Resp: wheezes not present, poor air movement Chest wall: no tenderness Cardio: regular rate and rhythm, S1, S2 normal, no murmur, click, rub or gallop GI: soft, non-tender; bowel sounds normal; no masses,  no organomegaly Extremities: edema brawny pitting edema in the lower extremities going up the abd wall Pulses: 2+ and symmetric Skin: Skin color, texture, turgor normal. No rashes or lesions Lymph nodes: Cervical, supraclavicular, and axillary nodes normal. Neurologic: Grossly normal        Jaquil Todt, Hunt Oris, MD 10/24/2015, 12:01 PM

## 2015-10-24 NOTE — Progress Notes (Signed)
PROGRESS NOTE    NYEL BACAK RFF:[id number] DOB: 05-30-1946 DOA: 10/20/2015 PCP: [name], MD  Primary Cardiologist: Dr. [name]  HPI/Brief narrative 70 year old male with history of A. fib, chronic diastolic CHF, HTN, NSTEMI, nonischemic cardiomyopathy, morbid obesity, anemia, GERD, lives with his father, ambulates with the help of a cane, claims compliance with all his medications, admitted to [facility] on 10/20/15 following fall at home and laying on floor for 22 hours, frequent falls. States that his baseline weight is 312 pounds (344 on 1/11). Admitted for further evaluation and management.  Assessment/Plan:   Anasarca/acute on chronic diastolic CHF/?Cor Pulmonale - Baseline weight said to be 312 pounds. Weight today 1/12:344 pounds. - Received IV Lasix 40 mg every 12 hours on 1/8 and 1/9 & none since. No Lasix started on 1/11 due to hypotension. - Cardiology consultation appreciated and indicate that he does not appear to be volume overloaded at this point which he obviously is to my exam although he may not have significant pulmonary edema and is able to lie flat. Cardiology recommended starting back home dose of Lasix and signed off. - 2-D echo 02/07/15: LVEF 55-60 percent and moderate pulmonary hypertension. - Nephrology was consulted 1/12: Have given a single dose of Lasix IV 80 mg. Monitor response.  Hypotension/essential hypertension - Discontinued ARB, especially given her worsening creatinine/acute kidney injury. Reduced dose of carvedilol with holding parameters. Monitor closely. - Blood pressures are better than 1/11.  PAF - Clinically seems to be in sinus rhythm. Reduced dose of carvedilol due to hypotension - INR was supratherapeutic until yesterday but is subtherapeutic today-Coumadin being managed by pharmacy.  Acute kidney injury - May be multifactorial: CHF with poor perfusion, ATN related to hypotension and ARB - Discontinue ARB. Reduced dose of carvedilol.  Lasix held. - Nephrology consulted: Suspect acute kidney injury is secondary to hemodynamics, colchicine discontinued, providing a dose of Lasix 80 mg IV 1, continue to hold ARB, low index of suspicion for obstruction.  Mild hyperkalemia on admission - Resolved.  Hyperlipidemia - Statins  Frequent falls - Probably multifactorial secondary to increased weight, gait instability & deconditioning. No rhabdomyolysis on admission/CK normal. - PT evaluation pending. - Due to history of fall, bruising over both hips on exam and right groin pain, obtained x-ray of pelvis and bilateral hips: No fractures.  Pancytopenia - Unclear etiology. Leukopenia resolved. Hemoglobin and platelet counts better.   DVT prophylaxis: INR subtherapeutic-Coumadin per pharmacy.  Code Status: DO NOT RESUSCITATE Family Communication: None at bedside Disposition Plan: To be determined. Not medically stable for discharge. DC to SNF when medically stable.    Consultants:  Cardiology-signed off.   Nephrology  Procedures:  None  Antimicrobials:  None   Subjective: Denies dyspnea. Indicates that leg swellings are better.   Objective: Filed Vitals:   10/23/15 1123 10/23/15 1258 10/23/15 2039 10/24/15 0627  BP: 102/62 90/46 102/57 106/59  Pulse:  56 64 64  Temp:  97.8 F (36.6 C) 98.4 F (36.9 C) 98.8 F (37.1 C)  TempSrc:   Oral Oral  Resp:  18 16 18   Height:      Weight:    156.1 kg (344 lb 2.2 oz)  SpO2:  93% 91% 93%    Intake/Output Summary (Last 24 hours) at 10/24/15 1327 Last data filed at 10/24/15 0849  Gross per 24 hour  Intake    710 ml  Output    350 ml  Net    360 ml   [employer]  10/22/15 2100 10/23/15 0700 10/24/15 0627  Weight: 155.8 kg (343 lb 7.6 oz) 156.4 kg (344 lb 12.8 oz) 156.1 kg (344 lb 2.2 oz)    Exam:  General exam: Moderately built and morbidly obese male lying comfortably propped up in bed. Respiratory system: Diminished breath sounds in the bases but  otherwise clear to auscultation. No increased work of breathing. Cardiovascular system: S1 & S2 heard, RRR. No JVD, murmurs, gallops, clicks. Pitting 3+ bilateral lower extremity edema extending up to anterior abdominal wall and lower back. Chronic edema changes of both legs. Gastrointestinal system: Abdomen is nondistended/obese, soft and nontender. Normal bowel sounds heard. Condom cath in place.  Central nervous system: Alert and oriented. No focal neurological deficits. Extremities: Symmetric 5 x 5 power. Superficial bruising over both hip regions-examined 1/11 .   Data Reviewed: Basic Metabolic Panel:  Recent Labs Lab 10/20/15 1115 10/21/15 0522 10/22/15 0417 10/23/15 1130 10/24/15 0518  NA 143 146* 143 141 143  K 5.6* 4.5 3.8 4.4 4.5  CL 108 112* 111 109 111  CO2 20* GLUCOSE 71 81 115* 104* 89  BUN 27* 27* 31* 38* 43*  CREATININE 1.18 1.18 1.51* 1.80* 1.96*  CALCIUM 9.1 8.4* 8.0* 8.3* 8.3*   Liver Function Tests:  Recent Labs Lab 10/20/15 1115 10/24/15 0518  AST 40 26  ALT 20 15*  ALKPHOS 153* 112  BILITOT 2.3* 2.0*  PROT 7.7 6.0*  ALBUMIN 3.8 2.8*   No results for input(s): LIPASE, AMYLASE in the last 168 hours. No results for input(s): AMMONIA in the last 168 hours. CBC:  Recent Labs Lab 10/20/15 1115 10/22/15 0417 10/24/15 0518  WBC 5.4 3.6* 4.0  NEUTROABS 4.4  --   --   HGB 13.6 10.9* 12.2*  HCT 42.9 34.0* 38.1*  MCV 105.1* 104.0* 104.7*  PLT 139* 116* 120*   Cardiac Enzymes:  Recent Labs Lab 10/20/15 1115 10/21/15 0522  CKTOTAL 96 86   BNP (last 3 results) No results for input(s): PROBNP in the last 8760 hours. CBG: No results for input(s): GLUCAP in the last 168 hours.  Recent Results (from the past 240 hour(s))  MRSA PCR Screening     Status: Abnormal   Collection Time: 10/20/15 11:39 PM  Result Value Ref Range Status   MRSA by PCR POSITIVE (A) NEGATIVE Final    Comment:        The GeneXpert MRSA Assay (FDA approved  for NASAL specimens only), is one component of a comprehensive MRSA colonization surveillance program. It is not intended to diagnose MRSA infection nor to guide or monitor treatment for MRSA infections. RESULT CALLED TO, READ BACK BY AND VERIFIED WITH: STEPHENS,A RN [phone] 10/21/15 MITCHELL,L          Studies: Dg Hips Bilat With Pelvis 3-4 Views  10/23/2015  CLINICAL DATA:  Status post fall 10/19/2015 with continued right hip pain. Initial encounter. EXAM: DG HIP (WITH OR WITHOUT PELVIS) 3-4V BILAT COMPARISON:  None. FINDINGS: No acute bony or joint abnormality is seen on the right or left. Mild to moderate bilateral hip osteoarthritis appears worse on the right. Lower lumbar spondylosis is partially visualized. IMPRESSION: No acute abnormality. Right worse than left hip osteoarthritis. Lower lumbar spondylosis. Electronically Signed   By: Drusilla Kanner M.D.   On: 10/23/2015 11:00        Scheduled Meds: . carvedilol  3.125 mg Oral BID WC  . Chlorhexidine Gluconate Cloth  6 each Topical Q0600  . feeding  supplement (ENSURE ENLIVE)  237 mL Oral BID BM  . feeding supplement (PRO-STAT SUGAR FREE 64)  30 mL Oral BID  . furosemide  80 mg Intravenous Once  . mupirocin ointment  1 application Nasal BID  . polyethylene glycol  17 g Oral Daily  . pravastatin  10 mg Oral q1800  . senna-docusate  2 tablet Oral BID   Continuous Infusions:   Principal Problem:   Recurrent falls Active Problems:   Essential hypertension   Chronic atrial fibrillation (HCC)   Diastolic dysfunction with acute on chronic heart failure [facility])   Physical deconditioning   Morbid obesity - BMI 45   Chronic anticoagulation   H/O NICM-last Echo EF 55% April 2016   Diastolic dysfunction-grade 13 January 2015   RBBB with LAFB    Time spent: 25 minutes.    Marcellus Scott, MD, FACP, FHM. [facility] Pager [phone]  If 7PM-7AM, please contact night-coverage [email or web address] Password  [email or web address] 10/24/2015, 1:27 PM

## 2015-10-24 NOTE — Progress Notes (Signed)
ANTICOAGULATION CONSULT NOTE - Follow Up Consult  Pharmacy Consult for Coumadin Indication: atrial fibrillation  No Known Allergies  Patient Measurements: Height: 6' (182.9 cm) Weight: (!) 344 lb 2.2 oz (156.1 kg) IBW/kg (Calculated) : 77.6 Heparin Dosing Weight:    Vital Signs: Temp: 98.8 F (37.1 C) (01/12 0627) Temp Source: Oral (01/12 0627) BP: 106/59 mmHg (01/12 0627) Pulse Rate: 64 (01/12 0627)  Labs:  Recent Labs  10/22/15 0417 10/23/15 0536 10/23/15 1130 10/24/15 0518  HGB 10.9*  --   --  12.2*  HCT 34.0*  --   --  38.1*  PLT 116*  --   --  120*  LABPROT 51.9* 39.8*  --  21.3*  INR 6.05* 4.26*  --  1.85*  CREATININE 1.51*  --  1.80* 1.96*    Estimated Creatinine Clearance: 55.6 mL/min (by C-G formula based on Cr of 1.96).   Assessment: 70 y/o M here with (frequent) fall (down 22 hrs), warfarin PTA for afib, INR is SUPRA-therapeutic on admit at 6.61 (last dose 1/7).  Anticoagulation: on warfarin PTA for afib. Home dose is 5 mg MWF and 2.5 mg AOD's. INR on admit 6.61 with last dose on 1/7. INR 6.61 > 7.9 > 6.05 > 4.26. S/p vitamin K 2.5 mg given on 1/11. INR now down to 1.85. CBC ok but watch low platelets.  Goal of Therapy:  INR 2-3 Monitor platelets by anticoagulation protocol: Yes   Plan:  1. Coumadin 7.5mg  po x 1 tonight. 2. Daily INR   Jenner Rosier S. Merilynn Finland, PharmD, BCPS Clinical Staff Pharmacist Pager [phone]  Misty Stanley Stillinger 10/24/2015,2:09 PM

## 2015-10-25 DIAGNOSIS — R296 Repeated falls: Secondary | ICD-10-CM | POA: Diagnosis not present

## 2015-10-25 DIAGNOSIS — I5033 Acute on chronic diastolic (congestive) heart failure: Secondary | ICD-10-CM | POA: Diagnosis not present

## 2015-10-25 DIAGNOSIS — I482 Chronic atrial fibrillation: Secondary | ICD-10-CM | POA: Diagnosis not present

## 2015-10-25 DIAGNOSIS — Z7901 Long term (current) use of anticoagulants: Secondary | ICD-10-CM | POA: Diagnosis not present

## 2015-10-25 LAB — COMPREHENSIVE METABOLIC PANEL
ALBUMIN: 2.7 g/dL — AB (ref 3.5–5.0)
ALK PHOS: 117 U/L (ref 38–126)
ALT: 15 U/L — ABNORMAL LOW (ref 17–63)
ANION GAP: 8 (ref 5–15)
AST: 24 U/L (ref 15–41)
BUN: 45 mg/dL — ABNORMAL HIGH (ref 6–20)
CALCIUM: 8.9 mg/dL (ref 8.9–10.3)
CHLORIDE: 109 mmol/L (ref 101–111)
CO2: 28 mmol/L (ref 22–32)
Creatinine, Ser: 1.82 mg/dL — ABNORMAL HIGH (ref 0.61–1.24)
GFR calc non Af Amer: 36 mL/min — ABNORMAL LOW (ref >60)
GFR, EST AFRICAN AMERICAN: 42 mL/min — AB (ref >60)
Glucose, Bld: 100 mg/dL — ABNORMAL HIGH (ref 65–99)
POTASSIUM: 4.4 mmol/L (ref 3.5–5.1)
SODIUM: 145 mmol/L (ref 135–145)
Total Bilirubin: 1.9 mg/dL — ABNORMAL HIGH (ref 0.3–1.2)
Total Protein: 6.2 g/dL — ABNORMAL LOW (ref 6.5–8.1)

## 2015-10-25 LAB — CBC
HEMATOCRIT: 38.5 % — AB (ref 39.0–52.0)
HEMOGLOBIN: 12.3 g/dL — AB (ref 13.0–17.0)
MCH: 33.2 pg (ref 26.0–34.0)
MCHC: 31.9 g/dL (ref 30.0–36.0)
MCV: 103.8 fL — ABNORMAL HIGH (ref 78.0–100.0)
Platelets: 131 10*3/uL — ABNORMAL LOW (ref 150–400)
RBC: 3.71 MIL/uL — AB (ref 4.22–5.81)
RDW: 15.7 % — ABNORMAL HIGH (ref 11.5–15.5)
WBC: 3.9 10*3/uL — ABNORMAL LOW (ref 4.0–10.5)

## 2015-10-25 LAB — OCCULT BLOOD X 1 CARD TO LAB, STOOL: FECAL OCCULT BLD: NEGATIVE

## 2015-10-25 LAB — PROTIME-INR
INR: 1.56 — AB (ref 0.00–1.49)
PROTHROMBIN TIME: 18.7 s — AB (ref 11.6–15.2)

## 2015-10-25 MED ORDER — WARFARIN SODIUM 5 MG PO TABS: 2.5000 mg | ORAL_TABLET | Freq: Every day | ORAL | Status: DC

## 2015-10-25 MED ORDER — FUROSEMIDE 40 MG PO TABS: 40.0000 mg | ORAL_TABLET | Freq: Two times a day (BID) | ORAL | Status: DC

## 2015-10-25 MED ORDER — PRO-STAT SUGAR FREE PO LIQD: 30.0000 mL | Freq: Two times a day (BID) | ORAL | Status: DC

## 2015-10-25 MED ORDER — WARFARIN SODIUM 7.5 MG PO TABS: 7.5000 mg | ORAL_TABLET | Freq: Once | ORAL | Status: DC

## 2015-10-25 MED ORDER — ENSURE ENLIVE PO LIQD: 237.0000 mL | Freq: Two times a day (BID) | ORAL | Status: DC

## 2015-10-25 MED ORDER — POTASSIUM CHLORIDE CRYS ER 20 MEQ PO TBCR: 20.0000 meq | EXTENDED_RELEASE_TABLET | Freq: Every day | ORAL | Status: DC

## 2015-10-25 MED ORDER — ACETAMINOPHEN 325 MG PO TABS: 650.0000 mg | ORAL_TABLET | Freq: Four times a day (QID) | ORAL | Status: DC | PRN

## 2015-10-25 MED ORDER — WARFARIN SODIUM 7.5 MG PO TABS
7.5000 mg | ORAL_TABLET | ORAL | Status: AC
  Administered 2015-10-25: 7.5 mg via ORAL
  Filled 2015-10-25: qty 1

## 2015-10-25 MED ORDER — FUROSEMIDE 10 MG/ML IJ SOLN
80.0000 mg | Freq: Once | INTRAMUSCULAR | Status: AC
  Administered 2015-10-25: 80 mg via INTRAVENOUS
  Filled 2015-10-25: qty 8

## 2015-10-25 MED ORDER — CARVEDILOL 3.125 MG PO TABS: 3.1250 mg | ORAL_TABLET | Freq: Two times a day (BID) | ORAL | Status: DC

## 2015-10-25 MED ORDER — FUROSEMIDE 10 MG/ML IJ SOLN
INTRAMUSCULAR | Status: AC
  Filled 2015-10-25: qty 4

## 2015-10-25 MED ORDER — POLYETHYLENE GLYCOL 3350 17 G PO PACK: 17.0000 g | PACK | Freq: Every day | ORAL | Status: DC

## 2015-10-25 MED ORDER — SENNOSIDES-DOCUSATE SODIUM 8.6-50 MG PO TABS: 2.0000 | ORAL_TABLET | Freq: Every evening | ORAL | Status: DC | PRN

## 2015-10-25 NOTE — Progress Notes (Signed)
Physical Therapy Treatment Patient Details Name: [name] MRN: [id number] DOB: 1946-02-04 Today's Date: 10/25/2015    History of Present Illness 70 y.o. male admitted after a fall on 71/7/17 and was unable to get himself up off the floor. PMH sigificant for cardiomyopathy. CHF, alcoholism, afib, HTN, morbid obesity, ventricular tachycardia, anemia, hypoxemia, and GERD.    PT Comments    Patient with decreased mobility and with little progress toward PT goals since last session. Pt limited by anxiety due to several falls. Current plan remains appropriate.  Follow Up Recommendations  SNF     Equipment Recommendations  Rolling walker with 5" wheels;3in1 (PT) (Bariatric)    Recommendations for Other Services       Precautions / Restrictions Precautions Precautions: Fall    Mobility  Bed Mobility Overal bed mobility: Needs Assistance Bed Mobility: Supine to Sit;Sit to Supine     Supine to sit: Mod assist;Max assist;+2 for physical assistance;+2 for safety/equipment Sit to supine: +2 for physical assistance;Mod assist   General bed mobility comments: vc for technique and hand placement upon sitting to assist with positioning of hips; mod/max A+2 for elevating trunk, advancing bialt LE to EOB, and scooting hips to EOB with use of bed pad; pt with difficulty leaning anteriorly   Transfers Overall transfer level: Needs assistance Equipment used: Rolling walker (2 wheeled) Transfers: Sit to/from Stand Sit to Stand: Mod assist;+2 physical assistance;+2 safety/equipment         General transfer comment: vc for hand placement and technique with mod A +2 for powering up into standing; increased time for achieving upright posture and vc for positioning of RW once in standing  Ambulation/Gait Ambulation/Gait assistance: Min guard;+2 safety/equipment Ambulation Distance (Feet): 1 Feet Assistive device: Rolling walker (2 wheeled)       General Gait Details: pt with  difficulty advancing bilat LE forward with tendency to march in place with little advancement; max vc for sequencing, position of RW and hand placement, and encouragement to advance LE forward with pt expressing anxiety about taking steps    Stairs            Wheelchair Mobility    Modified Rankin (Stroke Patients Only)       Balance                                    Cognition Arousal/Alertness: Awake/alert Behavior During Therapy: WFL for tasks assessed/performed Overall Cognitive Status: Within Functional Limits for tasks assessed                      Exercises      General Comments General comments (skin integrity, edema, etc.): BP in supine 128/61; BP in sitting EOB 131/70; BP in standing 132/71      Pertinent Vitals/Pain Pain Assessment: Faces Faces Pain Scale: Hurts a little bit Pain Location: bilat LE Pain Descriptors / Indicators: Sore Pain Intervention(s): Limited activity within patient's tolerance;Monitored during session;Repositioned    Home Living                      Prior Function            PT Goals (current goals can now be found in the care plan section) Acute Rehab PT Goals Patient Stated Goal: none stated PT Goal Formulation: With patient Time For Goal Achievement: 11/06/15 Potential to Achieve Goals: Good Progress towards PT  goals: progressing toward goals     Frequency  Min 3X/week    PT Plan Current plan remains appropriate    Co-evaluation             End of Session Equipment Utilized During Treatment: Gait belt Activity Tolerance: Patient tolerated treatment well Patient left: in bed;with call bell/phone within reach     Time: 1610-9604 PT Time Calculation (min) (ACUTE ONLY): 39 min  Charges:  $Gait Training: 8-22 mins $Therapeutic Activity: 23-37 mins                    G Codes:  Functional Assessment Tool Used: Clinical Judgement Functional Limitation: Mobility: Walking and  moving around Mobility: Walking and Moving Around Current Status ([id number]): At least 40 percent but less than 60 percent impaired, limited or restricted Mobility: Walking and Moving Around Goal Status [phone]): At least 1 percent but less than 20 percent impaired, limited or restricted   Derek Mound, PTA Pager: [phone]   10/25/2015, 12:03 PM

## 2015-10-25 NOTE — Progress Notes (Signed)
[facility] Progress Note    Assessment/ Plan:   1. Acute Kidney Injury - unclear etiology but most common cause in this setting would be hemodynamic. He has had relative hypotensive episodes intermittently. Last lasix dose on 1/9 and last Irbesartan on 1/10. He certainly appears to be volume overloaded and will always have edema but currently with anasarca. - I will look at the urine microscopy for signs of ATN --> very bland urine; this is all hyemodynamic. - No nephrotoxic agents currently except Colchicine which can cause neurotoxicity + myopathy in advanced CKD/ESRD. Will hold the colchicine for now even though he has not had diarrhea. - Will give a another dose of Lasix 80mg  x1 at this time --> may restart oral Lasix tomorrow 40mg  twice daily. He was only on 40mg  daily at home which is inadequate. May even need 80mg  BID temporarily. Leg swelling was actually worse at home (partially bec of the low lose Lasix). Really should be on twice a day dosing while on orals bec of his body's compensatory mechanisms. Fortunately he has had a great response to Lasix 80mg  IV. - Continue holding the Avapro  - Obstruction very low on diagnosis especially with a foley in place. 2. Diastolic heart failure. 3. HTN --> now with relative hypotension (restart Avapro only if BP can tolerate). 4. Chronic afib 5. Frequent falls  Subjective:   He thought he was at home yesterday and tried to get out of bed. Fortunately he didn't make it out of the bed. High risk for falls. Still has some dyspnea but no worse than yesterday. I&O/24hrs 1060-4650 = -3590   Objective:   BP 116/65 mmHg  Pulse 61  Temp(Src) 98.1 F (36.7 C) (Oral)  Resp 16  Ht 6' (1.829 m)  Wt 150.5 kg (331 lb 12.7 oz)  BMI 44.99 kg/m2  SpO2 92%  Intake/Output Summary (Last 24 hours) at 10/25/15 5009 Last data filed at 10/25/15 0736  Gross per 24 hour  Intake    725 ml  Output   4650 ml  Net  -3925 ml   Weight change:  -5.6 kg (-12 lb 5.5 oz)  Physical Exam: General appearance: alert and appears older than stated age Head: Normocephalic, without obvious abnormality, atraumatic Eyes: negative Neck: no adenopathy, no carotid bruit, supple, symmetrical, trachea midline and thyroid not enlarged, symmetric, no tenderness/mass/nodules Resp: wheezes not present, poor air movement Cardio: regular rate and rhythm, S1, S2 normal, no murmur, click, rub or gallop GI: soft, non-tender; bowel sounds normal; no masses, no organomegaly Extremities: edema brawny pitting edema in the lower extremities going up the abd wall Pulses: 2+ and symmetric   Imaging: Dg Hips Bilat With Pelvis 3-4 Views  10/23/2015  CLINICAL DATA:  Status post fall 10/19/2015 with continued right hip pain. Initial encounter. EXAM: DG HIP (WITH OR WITHOUT PELVIS) 3-4V BILAT COMPARISON:  None. FINDINGS: No acute bony or joint abnormality is seen on the right or left. Mild to moderate bilateral hip osteoarthritis appears worse on the right. Lower lumbar spondylosis is partially visualized. IMPRESSION: No acute abnormality. Right worse than left hip osteoarthritis. Lower lumbar spondylosis. Electronically Signed   By: Drusilla Kanner M.D.   On: 10/23/2015 11:00    Labs: BMET  Recent Labs Lab 10/20/15 1115 10/21/15 0522 10/22/15 0417 10/23/15 1130 10/24/15 0518 10/25/15 0528  NA 143 146* 143 141 143 145  K 5.6* 4.5 3.8 4.4 4.5 4.4  CL 108 112* 111 109 111 109  CO2 20* 24  GLUCOSE 71 81 115* 104* 89 100*  BUN 27* 27* 31* 38* 43* 45*  CREATININE 1.18 1.18 1.51* 1.80* 1.96* 1.82*  CALCIUM 9.1 8.4* 8.0* 8.3* 8.3* 8.9   CBC  Recent Labs Lab 10/20/15 1115 10/22/15 0417 10/24/15 0518 10/25/15 0528  WBC 5.4 3.6* 4.0 3.9*  NEUTROABS 4.4  --   --   --   HGB 13.6 10.9* 12.2* 12.3*  HCT 42.9 34.0* 38.1* 38.5*  MCV 105.1* 104.0* 104.7* 103.8*  PLT 139* 116* 120* 131*    Medications:    . carvedilol  3.125 mg Oral BID WC   . Chlorhexidine Gluconate Cloth  6 each Topical Q0600  . feeding supplement (ENSURE ENLIVE)  237 mL Oral BID BM  . feeding supplement (PRO-STAT SUGAR FREE 64)  30 mL Oral BID  . mupirocin ointment  1 application Nasal BID  . polyethylene glycol  17 g Oral Daily  . pravastatin  10 mg Oral q1800  . senna-docusate  2 tablet Oral BID  . Warfarin - Pharmacist Dosing Inpatient   Does not apply q1800      Paulene Floor, MD 10/25/2015, 9:03 AM

## 2015-10-25 NOTE — Clinical Social Work Note (Addendum)
10:44am- CSW received notification of patient's medical readiness today.  SNF has been updated and is agreeable to this discharge plan.  RNCM, RN and MD are all aware.    Patient will discharge today per MD order. (summary and order to be complete by 2pm per MD) Patient will discharge to [facility] RN to call report prior to transportation to: [phone] Transportation: [facility]- to be scheduled after 2pm  CSW sent discharge summary to SNF for review. PT to work with patient this morning to ensure continued insurance authorization.    Vickii Penna, LCSW [phone]  5N1-9; 2S 15-16 and [facility] Licensed Clinical Social Worker

## 2015-10-25 NOTE — Progress Notes (Addendum)
ANTICOAGULATION CONSULT NOTE - Follow Up Consult  Pharmacy Consult for Coumadin Indication: atrial fibrillation  No Known Allergies  Patient Measurements: Height: 6' (182.9 cm) Weight: (!) 331 lb 12.7 oz (150.5 kg) IBW/kg (Calculated) : 77.6 Heparin Dosing Weight:    Vital Signs: Temp: 98.1 F (36.7 C) (01/13 0639) Temp Source: Oral (01/13 0639) BP: 116/65 mmHg (01/13 0639) Pulse Rate: 61 (01/13 0639)  Labs:  Recent Labs  10/23/15 0536 10/23/15 1130 10/24/15 0518 10/25/15 0528  HGB  --   --  12.2* 12.3*  HCT  --   --  38.1* 38.5*  PLT  --   --  120* 131*  LABPROT 39.8*  --  21.3* 18.7*  INR 4.26*  --  1.85* 1.56*  CREATININE  --  1.80* 1.96* 1.82*    Estimated Creatinine Clearance: 58.7 mL/min (by C-G formula based on Cr of 1.82).   Assessment: 70 y/o M here with (frequent) fall (down 22 hrs), warfarin PTA for afib, INR is SUPRA-therapeutic on admit at 6.61 (last dose 1/7).  Anticoagulation: on warfarin PTA for afib. Home dose is 5 mg MWF and 2.5 mg AOD's. INR on admit 6.61 with last dose on 1/7. S/p vitamin K 2.5 mg given on 1/11. INR now down to 1.56. CBC ok    Goal of Therapy:  INR 2-3 Monitor platelets by anticoagulation protocol: Yes   Plan:  1. Coumadin 7.5mg  po x 1 again tonight. 2. Daily INR   Adden: Spoke with patient regarding Coumadin and elevated INR on admission. He does not know why INR would have been so elevated as he was "following all the instructions." He does not like green leafy vegetables. No bleeding; some bruising.   Kru Allman S. Merilynn Finland, PharmD, BCPS Clinical Staff Pharmacist Pager [phone]  Misty Stanley Stillinger 10/25/2015,11:07 AM

## 2015-10-25 NOTE — Discharge Instructions (Signed)
Information on my medicine - Coumadin®   (Warfarin) ° °This medication education was reviewed with me or my healthcare representative as part of my discharge preparation.  The pharmacist that spoke with me during my hospital stay was:  Jasmarie Coppock Stillinger, RPH ° °Why was Coumadin prescribed for you? °Coumadin was prescribed for you because you have a blood clot or a medical condition that can cause an increased risk of forming blood clots. Blood clots can cause serious health problems by blocking the flow of blood to the heart, lung, or brain. Coumadin can prevent harmful blood clots from forming. °As a reminder your indication for Coumadin is:   Stroke Prevention Because Of Atrial Fibrillation ° °What test will check on my response to Coumadin? °While on Coumadin (warfarin) you will need to have an INR test regularly to ensure that your dose is keeping you in the desired range. The INR (international normalized ratio) number is calculated from the result of the laboratory test called prothrombin time (PT). ° °If an INR APPOINTMENT HAS NOT ALREADY BEEN MADE FOR YOU please schedule an appointment to have this lab work done by your health care provider within 7 days. °Your INR goal is usually a number between:  2 to 3 or your provider may give you a more narrow range like 2-2.5.  Ask your health care provider during an office visit what your goal INR is. ° °What  do you need to  know  About  COUMADIN? °Take Coumadin (warfarin) exactly as prescribed by your healthcare provider about the same time each day.  DO NOT stop taking without talking to the doctor who prescribed the medication.  Stopping without other blood clot prevention medication to take the place of Coumadin may increase your risk of developing a new clot or stroke.  Get refills before you run out. ° °What do you do if you miss a dose? °If you miss a dose, take it as soon as you remember on the same day then continue your regularly scheduled  regimen the next day.  Do not take two doses of Coumadin at the same time. ° °Important Safety Information °A possible side effect of Coumadin (Warfarin) is an increased risk of bleeding. You should call your healthcare provider right away if you experience any of the following: °  Bleeding from an injury or your nose that does not stop. °  Unusual colored urine (red or dark brown) or unusual colored stools (red or black). °  Unusual bruising for unknown reasons. °  A serious fall or if you hit your head (even if there is no bleeding). ° °Some foods or medicines interact with Coumadin® (warfarin) and might alter your response to warfarin. To help avoid this: °  Eat a balanced diet, maintaining a consistent amount of Vitamin K. °  Notify your provider about major diet changes you plan to make. °  Avoid alcohol or limit your intake to 1 drink for women and 2 drinks for men per day. °(1 drink is 5 oz. wine, 12 oz. beer, or 1.5 oz. liquor.) ° °Make sure that ANY health care provider who prescribes medication for you knows that you are taking Coumadin (warfarin).  Also make sure the healthcare provider who is monitoring your Coumadin knows when you have started a new medication including herbals and non-prescription products. ° °Coumadin® (Warfarin)  Major Drug Interactions  °Increased Warfarin Effect Decreased Warfarin Effect  °Alcohol (large quantities) °Antibiotics (esp. Septra/Bactrim, Flagyl, Cipro) °Amiodarone (Cordarone) °Aspirin (  ASA) °Cimetidine (Tagamet) °Megestrol (Megace) °NSAIDs (ibuprofen, naproxen, etc.) °Piroxicam (Feldene) °Propafenone (Rythmol SR) °Propranolol (Inderal) °Isoniazid (INH) °Posaconazole (Noxafil) Barbiturates (Phenobarbital) °Carbamazepine (Tegretol) °Chlordiazepoxide (Librium) °Cholestyramine (Questran) °Griseofulvin °Oral Contraceptives °Rifampin °Sucralfate (Carafate) °Vitamin K  ° °Coumadin® (Warfarin) Major Herbal Interactions  °Increased Warfarin Effect Decreased Warfarin Effect    °Garlic °Ginseng °Ginkgo biloba Coenzyme Q10 °Green tea °St. John’s wort   ° °Coumadin® (Warfarin) FOOD Interactions  °Eat a consistent number of servings per week of foods HIGH in Vitamin K °(1 serving = ½ cup)  °Collards (cooked, or boiled & drained) °Kale (cooked, or boiled & drained) °Mustard greens (cooked, or boiled & drained) °Parsley *serving size only = ¼ cup °Spinach (cooked, or boiled & drained) °Swiss chard (cooked, or boiled & drained) °Turnip greens (cooked, or boiled & drained)  °Eat a consistent number of servings per week of foods MEDIUM-HIGH in Vitamin K °(1 serving = 1 cup)  °Asparagus (cooked, or boiled & drained) °Broccoli (cooked, boiled & drained, or raw & chopped) °Brussel sprouts (cooked, or boiled & drained) *serving size only = ½ cup °Lettuce, raw (green leaf, endive, romaine) °Spinach, raw °Turnip greens, raw & chopped  ° °These websites have more information on Coumadin (warfarin):  www.coumadin.com; °www.ahrq.gov/consumer/coumadin.htm; ° ° ° °

## 2015-10-25 NOTE — Discharge Summary (Addendum)
Physician Discharge Summary  [name]:[id number] DOB: 16-Jan-1946 DOA: 10/20/2015  PCP: [name], MD  Admit date: 10/20/2015 Discharge date: 10/25/2015  Time spent: Greater than 30 minutes  Recommendations for Outpatient Follow-up:  1. M.D. at SNF in 3 days with repeat labs (CBC, CMP, PT & INR). Coumadin dose adjustment as needed.  2. Recommend outpatient nephrology consultation and follow-up. 3. Dr. [name], Cardiology in 2-3 weeks. 4. Dr. [name], PCP upon discharge from SNF.  Discharge Diagnoses:  Principal Problem:   Recurrent falls Active Problems:   Essential hypertension   Chronic atrial fibrillation (HCC)   Diastolic dysfunction with acute on chronic heart failure [facility])   Physical deconditioning   Morbid obesity - BMI 45   Chronic anticoagulation   H/O NICM-last Echo EF 55% April 2016   Diastolic dysfunction-grade 13 January 2015   RBBB with LAFB   Discharge Condition: Improved & Stable  Diet recommendation: Heart healthy and renal diet.   Filed Weights   10/23/15 0700 10/24/15 0627 10/25/15 0639  Weight: 156.4 kg (344 lb 12.8 oz) 156.1 kg (344 lb 2.2 oz) 150.5 kg (331 lb 12.7 oz)    History of present illness:  70 year old male with history of A. fib, chronic diastolic CHF, HTN, NSTEMI, nonischemic cardiomyopathy, morbid obesity, anemia, GERD, lives with his father, ambulates with the help of a cane, claims compliance with all his medications, admitted to [facility] on 10/20/15 following fall at home and laying on floor for 22 hours, frequent falls. States that his baseline weight is 312 pounds (344 on 1/11). Admitted for further evaluation and management.  Hospital Course:   Anasarca/acute on chronic diastolic CHF/?Cor Pulmonale - Baseline weight said to be 312 pounds. Patient had weight of 344 pounds yesterday. Weight today is 331 pounds (13 pounds weight loss) - Received IV Lasix 40 mg every 12 hours on 1/8 and 1/9 & none since. No Lasix started on  1/11 due to hypotension. - Cardiology consultation appreciated and indicate that he does not appear to be volume overloaded at this point which he obviously is to my exam although he may not have significant pulmonary edema and is able to lie flat. Cardiology recommended starting back home dose of Lasix and signed off. - 2-D echo 02/07/15: LVEF 55-60 percent and moderate pulmonary hypertension. - Nephrology was consulted 1/12: Have given a single dose of Lasix IV 80 MG times one dose with good response (-3 L). Nephrology repeating a dose of IV Lasix today and recommend starting Lasix 40 mg twice a day from tomorrow and have cleared for discharge to SNF. May consider increasing Lasix to 80 mg twice a day depending on response and renal functions.  Hypotension/essential hypertension - Discontinued ARB, especially given her worsening creatinine/acute kidney injury. Reduced dose of carvedilol. Monitor closely. - Hypotension has resolved.  PAF - Clinically seems to be in sinus rhythm. Reduced dose of carvedilol due to hypotension - INR was supratherapeutic until yesterday but is subtherapeutic today-Coumadin being managed by pharmacy. - Unclear as to why his INR was supratherapeutic on admission. INR today is 1.56. Discussed with pharmacy who will dose today's dose of Coumadin and recommend discharging on Coumadin 2.5 MG daily beginning tomorrow with close monitoring and adjustment of dose as outpatient.  Acute kidney injury - Discontinue ARB. Reduced dose of carvedilol.  - Nephrology consulted: Suspect acute kidney injury is secondary to hemodynamics, colchicine discontinued, provided a dose of Lasix 80 mg IV 1, continue to hold ARB, low index  of suspicion for obstruction. - Nephrology has seen and cleared for discharge on above recommendations. Discussed with nephrology today. - Creatinine has stabilized in the 1.8 range in the last 3 days. Baseline creatinine 1-1.2.  Mild hyperkalemia on  admission - Resolved.  Hyperlipidemia - Statins  Frequent falls - Probably multifactorial secondary to increased weight, gait instability & deconditioning. No rhabdomyolysis on admission/CK normal. - Due to history of fall, bruising over both hips on exam and right groin pain, obtained x-ray of pelvis and bilateral hips: No fractures.  Pancytopenia - Unclear etiology. Stable. Outpatient follow-up. May consider outpatient hematology consultation.  Abnormal LFTs/elevated bilirubin - Unclear etiology. Follow up LFTs in a few days and consider outpatient evaluation as deemed necessary.  DO NOT RESUSCITATE    Consultants:  Cardiology-signed off.   Nephrology  Procedures:  None   Discharge Exam:  Complaints:  Feels better. Denies dyspnea or chest pain. Leg swelling improved. Having BMs. Tolerating diet. No pain issues reported.  Filed Vitals:   10/24/15 1454 10/24/15 1535 10/24/15 2319 10/25/15 0639  BP: 120/49 125/63 118/64 116/65  Pulse: 50 64 54 61  Temp:  99.1 F (37.3 C) 98.2 F (36.8 C) 98.1 F (36.7 C)  TempSrc:  Axillary Oral Oral  Resp:  18 16 16   Height:      Weight:    150.5 kg (331 lb 12.7 oz)  SpO2:  94% 92% 92%    General exam: Moderately built and morbidly obese male lying comfortably propped up in bed. Respiratory system: clear to auscultation. No increased work of breathing. Cardiovascular system: S1 & S2 heard, RRR. No JVD, murmurs, gallops, clicks. Chronic edema changes of both legs. Lower extremity edema has definitely improved (2+) and abdominal wall edema has either decreased or resolved. Gastrointestinal system: Abdomen is nondistended/obese, soft and nontender. Normal bowel sounds heard. Condom cath in place.  Central nervous system: Alert and oriented. No focal neurological deficits. Extremities: Symmetric 5 x 5 power. Superficial bruising over both hip regions-examined 1/11 .  Discharge Instructions      Discharge Instructions     (HEART FAILURE PATIENTS) Call MD:  Anytime you have any of the following symptoms: 1) 3 pound weight gain in 24 hours or 5 pounds in 1 week 2) shortness of breath, with or without a dry hacking cough 3) swelling in the hands, feet or stomach 4) if you have to sleep on extra pillows at night in order to breathe.    Complete by:  As directed      Call MD for:  difficulty breathing, headache or visual disturbances    Complete by:  As directed      Call MD for:  extreme fatigue    Complete by:  As directed      Call MD for:  persistant dizziness or light-headedness    Complete by:  As directed      Call MD for:  persistant nausea and vomiting    Complete by:  As directed      Call MD for:  severe uncontrolled pain    Complete by:  As directed      Call MD for:  temperature >100.4    Complete by:  As directed      Diet - low sodium heart healthy    Complete by:  As directed      Increase activity slowly    Complete by:  As directed             Medication  List    STOP taking these medications        colchicine 0.6 MG tablet     olmesartan 40 MG tablet  Commonly known as:  BENICAR      TAKE these medications        acetaminophen 325 MG tablet  Commonly known as:  TYLENOL  Take 2 tablets (650 mg total) by mouth every 6 (six) hours as needed for mild pain, moderate pain, fever or headache (or Fever >/= 101).     carvedilol 3.125 MG tablet  Commonly known as:  COREG  Take 1 tablet (3.125 mg total) by mouth 2 (two) times daily with a meal.     feeding supplement (ENSURE ENLIVE) Liqd  Take 237 mLs by mouth 2 (two) times daily between meals.     feeding supplement (PRO-STAT SUGAR FREE 64) Liqd  Take 30 mLs by mouth 2 (two) times daily.     ferrous sulfate 325 (65 FE) MG tablet  Take 1 tablet by mouth daily.     furosemide 40 MG tablet  Commonly known as:  LASIX  Take 1 tablet (40 mg total) by mouth 2 (two) times daily.  Start taking on:  10/26/2015     polyethylene glycol  packet  Commonly known as:  MIRALAX / GLYCOLAX  Take 17 g by mouth daily.     potassium chloride SA 20 MEQ tablet  Commonly known as:  K-DUR,KLOR-CON  Take 1 tablet (20 mEq total) by mouth daily.     pravastatin 10 MG tablet  Commonly known as:  PRAVACHOL  Take 10 mg by mouth daily.     senna-docusate 8.6-50 MG tablet  Commonly known as:  Senokot-S  Take 2 tablets by mouth at bedtime as needed for mild constipation or moderate constipation.     VITAMIN B-12 PO  Take 1 tablet by mouth daily.     vitamin C 500 MG tablet  Commonly known as:  ASCORBIC ACID  Take 500 mg by mouth daily.     warfarin 5 MG tablet  Commonly known as:  COUMADIN  Take 0.5 tablets (2.5 mg total) by mouth daily at 6 PM.  Start taking on:  10/26/2015       Follow-up Information    Follow up with [facility] .   Specialty:  Skilled Nursing Facility   Contact information:   [address] [place] [address] [phone]       The results of significant diagnostics from this hospitalization (including imaging, microbiology, ancillary and laboratory) are listed below for reference.    Significant Diagnostic Studies: Dg Chest 2 View  10/20/2015  CLINICAL DATA:  Status post fall 10/19/2015. EXAM: CHEST  2 VIEW COMPARISON:  Single view of the chest 10/14/2015. FINDINGS: There is cardiomegaly without edema. No pneumothorax or pleural effusion. No focal bony abnormality. IMPRESSION: Cardiomegaly without acute disease. Electronically Signed   By: Drusilla Kanner M.D.   On: 10/20/2015 13:27   Dg Chest Port 1 View  10/14/2015  CLINICAL DATA:  Weakness. EXAM: PORTABLE CHEST 1 VIEW COMPARISON:  02/06/2015 FINDINGS: Cardiomegaly is unchanged. There is atherosclerosis of the thoracic aorta. Question a vascular congestion without pulmonary edema. No consolidation, large pleural effusion or pneumothorax. No acute osseous abnormalities are seen. IMPRESSION: Stable cardiomegaly. Question  of vascular congestion, no pulmonary edema. Electronically Signed   By: Rubye Oaks M.D.   On: 10/14/2015 02:59   Dg Hips Bilat With Pelvis 3-4 Views  10/23/2015  CLINICAL DATA:  Status post fall 10/19/2015 with continued right hip pain. Initial encounter. EXAM: DG HIP (WITH OR WITHOUT PELVIS) 3-4V BILAT COMPARISON:  None. FINDINGS: No acute bony or joint abnormality is seen on the right or left. Mild to moderate bilateral hip osteoarthritis appears worse on the right. Lower lumbar spondylosis is partially visualized. IMPRESSION: No acute abnormality. Right worse than left hip osteoarthritis. Lower lumbar spondylosis. Electronically Signed   By: Drusilla Kanner M.D.   On: 10/23/2015 11:00    Microbiology: Recent Results (from the past 240 hour(s))  MRSA PCR Screening     Status: Abnormal   Collection Time: 10/20/15 11:39 PM  Result Value Ref Range Status   MRSA by PCR POSITIVE (A) NEGATIVE Final    Comment:        The GeneXpert MRSA Assay (FDA approved for NASAL specimens only), is one component of a comprehensive MRSA colonization surveillance program. It is not intended to diagnose MRSA infection nor to guide or monitor treatment for MRSA infections. RESULT CALLED TO, READ BACK BY AND VERIFIED WITH: STEPHENS,A RN [id number] 10/21/15 MITCHELL,L      Labs: Basic Metabolic Panel:  Recent Labs Lab 10/21/15 0522 10/22/15 0417 10/23/15 1130 10/24/15 0518 10/25/15 0528  NA 146* 143 141 143 145  K 4.5 3.8 4.4 4.5 4.4  CL 112* 111 109 111 109  CO2 GLUCOSE 81 115* 104* 89 100*  BUN 27* 31* 38* 43* 45*  CREATININE 1.18 1.51* 1.80* 1.96* 1.82*  CALCIUM 8.4* 8.0* 8.3* 8.3* 8.9   Liver Function Tests:  Recent Labs Lab 10/20/15 1115 10/24/15 0518 10/25/15 0528  AST 40 26 24  ALT 20 15* 15*  ALKPHOS 153* 112 117  BILITOT 2.3* 2.0* 1.9*  PROT 7.7 6.0* 6.2*  ALBUMIN 3.8 2.8* 2.7*   No results for input(s): LIPASE, AMYLASE in the last 168 hours. No results  for input(s): AMMONIA in the last 168 hours. CBC:  Recent Labs Lab 10/20/15 1115 10/22/15 0417 10/24/15 0518 10/25/15 0528  WBC 5.4 3.6* 4.0 3.9*  NEUTROABS 4.4  --   --   --   HGB 13.6 10.9* 12.2* 12.3*  HCT 42.9 34.0* 38.1* 38.5*  MCV 105.1* 104.0* 104.7* 103.8*  PLT 139* 116* 120* 131*   Cardiac Enzymes:  Recent Labs Lab 10/20/15 1115 10/21/15 0522  CKTOTAL 96 86   BNP: BNP (last 3 results)  Recent Labs  02/06/15 0838  BNP 1119.2*    ProBNP (last 3 results) No results for input(s): PROBNP in the last 8760 hours.  CBG: No results for input(s): GLUCAP in the last 168 hours.     Signed:  Marcellus Scott, MD, FACP, FHM. [facility] Pager [phone]  If 7PM-7AM, please contact night-coverage [email or web address] Password [email or web address] 10/25/2015, 1:29 PM

## 2015-10-26 LAB — URINE CULTURE

## 2015-11-13 ENCOUNTER — Ambulatory Visit: Payer: Medicare HMO | Admitting: Physician Assistant

## 2015-11-14 ENCOUNTER — Encounter: Payer: Self-pay | Admitting: *Deleted

## 2015-11-15 ENCOUNTER — Other Ambulatory Visit (HOSPITAL_COMMUNITY)
Admission: RE | Admit: 2015-11-15 | Discharge: 2015-11-15 | Disposition: A | Discharge status: Home / Self Care | Payer: Medicare HMO | Source: Other Acute Inpatient Hospital | Attending: Physician Assistant | Admitting: Physician Assistant

## 2015-11-15 DIAGNOSIS — Z7901 Long term (current) use of anticoagulants: Secondary | ICD-10-CM | POA: Diagnosis present

## 2015-11-15 DIAGNOSIS — I482 Chronic atrial fibrillation: Secondary | ICD-10-CM | POA: Diagnosis present

## 2015-11-15 LAB — PROTIME-INR
INR: 3.26 — AB (ref 0.00–1.49)
PROTHROMBIN TIME: 32.6 s — AB (ref 11.6–15.2)

## 2015-11-21 ENCOUNTER — Other Ambulatory Visit (HOSPITAL_COMMUNITY)
Admission: AD | Admit: 2015-11-21 | Discharge: 2015-11-21 | Disposition: A | Discharge status: Home / Self Care | Payer: Medicare HMO | Source: Skilled Nursing Facility | Attending: Family Medicine | Admitting: Family Medicine

## 2015-11-21 DIAGNOSIS — Z7901 Long term (current) use of anticoagulants: Secondary | ICD-10-CM | POA: Insufficient documentation

## 2015-11-21 LAB — PROTIME-INR
INR: 2.98 — ABNORMAL HIGH (ref 0.00–1.49)
Prothrombin Time: 30.5 seconds — ABNORMAL HIGH (ref 11.6–15.2)

## 2016-03-04 ENCOUNTER — Emergency Department (HOSPITAL_COMMUNITY): Payer: Medicare HMO

## 2016-03-04 ENCOUNTER — Encounter (HOSPITAL_COMMUNITY): Payer: Self-pay | Admitting: *Deleted

## 2016-03-04 ENCOUNTER — Observation Stay (HOSPITAL_COMMUNITY): Payer: Medicare HMO

## 2016-03-04 ENCOUNTER — Inpatient Hospital Stay (HOSPITAL_COMMUNITY)
Admission: EM | Admit: 2016-03-04 | Discharge: 2016-03-16 | DRG: 193 | Disposition: A | Discharge status: Skilled Nursing Facility | Payer: Medicare HMO | Attending: Internal Medicine | Admitting: Internal Medicine

## 2016-03-04 DIAGNOSIS — R627 Adult failure to thrive: Secondary | ICD-10-CM

## 2016-03-04 DIAGNOSIS — I5043 Acute on chronic combined systolic (congestive) and diastolic (congestive) heart failure: Secondary | ICD-10-CM | POA: Diagnosis present

## 2016-03-04 DIAGNOSIS — I10 Essential (primary) hypertension: Secondary | ICD-10-CM | POA: Diagnosis present

## 2016-03-04 DIAGNOSIS — L0390 Cellulitis, unspecified: Secondary | ICD-10-CM | POA: Diagnosis present

## 2016-03-04 DIAGNOSIS — Z87891 Personal history of nicotine dependence: Secondary | ICD-10-CM

## 2016-03-04 DIAGNOSIS — Z7901 Long term (current) use of anticoagulants: Secondary | ICD-10-CM

## 2016-03-04 DIAGNOSIS — G9341 Metabolic encephalopathy: Secondary | ICD-10-CM | POA: Diagnosis present

## 2016-03-04 DIAGNOSIS — Z825 Family history of asthma and other chronic lower respiratory diseases: Secondary | ICD-10-CM

## 2016-03-04 DIAGNOSIS — J9690 Respiratory failure, unspecified, unspecified whether with hypoxia or hypercapnia: Secondary | ICD-10-CM

## 2016-03-04 DIAGNOSIS — E875 Hyperkalemia: Secondary | ICD-10-CM | POA: Diagnosis present

## 2016-03-04 DIAGNOSIS — I482 Chronic atrial fibrillation, unspecified: Secondary | ICD-10-CM | POA: Diagnosis present

## 2016-03-04 DIAGNOSIS — Z9181 History of falling: Secondary | ICD-10-CM

## 2016-03-04 DIAGNOSIS — I2609 Other pulmonary embolism with acute cor pulmonale: Secondary | ICD-10-CM

## 2016-03-04 DIAGNOSIS — J189 Pneumonia, unspecified organism: Principal | ICD-10-CM

## 2016-03-04 DIAGNOSIS — J9621 Acute and chronic respiratory failure with hypoxia: Secondary | ICD-10-CM | POA: Diagnosis present

## 2016-03-04 DIAGNOSIS — M109 Gout, unspecified: Secondary | ICD-10-CM | POA: Diagnosis present

## 2016-03-04 DIAGNOSIS — D539 Nutritional anemia, unspecified: Secondary | ICD-10-CM | POA: Diagnosis present

## 2016-03-04 DIAGNOSIS — I429 Cardiomyopathy, unspecified: Secondary | ICD-10-CM | POA: Diagnosis present

## 2016-03-04 DIAGNOSIS — J9601 Acute respiratory failure with hypoxia: Secondary | ICD-10-CM | POA: Diagnosis present

## 2016-03-04 DIAGNOSIS — R609 Edema, unspecified: Secondary | ICD-10-CM

## 2016-03-04 DIAGNOSIS — D638 Anemia in other chronic diseases classified elsewhere: Secondary | ICD-10-CM | POA: Diagnosis not present

## 2016-03-04 DIAGNOSIS — Z8249 Family history of ischemic heart disease and other diseases of the circulatory system: Secondary | ICD-10-CM

## 2016-03-04 DIAGNOSIS — R29898 Other symptoms and signs involving the musculoskeletal system: Secondary | ICD-10-CM

## 2016-03-04 DIAGNOSIS — R296 Repeated falls: Secondary | ICD-10-CM | POA: Diagnosis not present

## 2016-03-04 DIAGNOSIS — T45515A Adverse effect of anticoagulants, initial encounter: Secondary | ICD-10-CM | POA: Diagnosis present

## 2016-03-04 DIAGNOSIS — F10231 Alcohol dependence with withdrawal delirium: Secondary | ICD-10-CM | POA: Diagnosis present

## 2016-03-04 DIAGNOSIS — I872 Venous insufficiency (chronic) (peripheral): Secondary | ICD-10-CM | POA: Diagnosis present

## 2016-03-04 DIAGNOSIS — I2781 Cor pulmonale (chronic): Secondary | ICD-10-CM | POA: Diagnosis present

## 2016-03-04 DIAGNOSIS — Z6841 Body Mass Index (BMI) 40.0 and over, adult: Secondary | ICD-10-CM

## 2016-03-04 DIAGNOSIS — Z79899 Other long term (current) drug therapy: Secondary | ICD-10-CM

## 2016-03-04 DIAGNOSIS — E662 Morbid (severe) obesity with alveolar hypoventilation: Secondary | ICD-10-CM | POA: Diagnosis present

## 2016-03-04 DIAGNOSIS — E872 Acidosis: Secondary | ICD-10-CM | POA: Diagnosis present

## 2016-03-04 DIAGNOSIS — R001 Bradycardia, unspecified: Secondary | ICD-10-CM | POA: Diagnosis present

## 2016-03-04 DIAGNOSIS — R5381 Other malaise: Secondary | ICD-10-CM | POA: Diagnosis present

## 2016-03-04 DIAGNOSIS — J9622 Acute and chronic respiratory failure with hypercapnia: Secondary | ICD-10-CM | POA: Diagnosis present

## 2016-03-04 DIAGNOSIS — K219 Gastro-esophageal reflux disease without esophagitis: Secondary | ICD-10-CM | POA: Diagnosis present

## 2016-03-04 DIAGNOSIS — I5033 Acute on chronic diastolic (congestive) heart failure: Secondary | ICD-10-CM | POA: Diagnosis present

## 2016-03-04 DIAGNOSIS — M795 Residual foreign body in soft tissue: Secondary | ICD-10-CM

## 2016-03-04 DIAGNOSIS — R791 Abnormal coagulation profile: Secondary | ICD-10-CM | POA: Diagnosis present

## 2016-03-04 DIAGNOSIS — Y95 Nosocomial condition: Secondary | ICD-10-CM | POA: Diagnosis present

## 2016-03-04 DIAGNOSIS — Z66 Do not resuscitate: Secondary | ICD-10-CM | POA: Diagnosis present

## 2016-03-04 DIAGNOSIS — F1010 Alcohol abuse, uncomplicated: Secondary | ICD-10-CM

## 2016-03-04 DIAGNOSIS — D696 Thrombocytopenia, unspecified: Secondary | ICD-10-CM | POA: Diagnosis present

## 2016-03-04 DIAGNOSIS — F10931 Alcohol use, unspecified with withdrawal delirium: Secondary | ICD-10-CM

## 2016-03-04 DIAGNOSIS — I110 Hypertensive heart disease with heart failure: Secondary | ICD-10-CM | POA: Diagnosis present

## 2016-03-04 DIAGNOSIS — D7589 Other specified diseases of blood and blood-forming organs: Secondary | ICD-10-CM | POA: Diagnosis present

## 2016-03-04 DIAGNOSIS — J9811 Atelectasis: Secondary | ICD-10-CM | POA: Diagnosis present

## 2016-03-04 DIAGNOSIS — I5022 Chronic systolic (congestive) heart failure: Secondary | ICD-10-CM | POA: Insufficient documentation

## 2016-03-04 DIAGNOSIS — G9340 Encephalopathy, unspecified: Secondary | ICD-10-CM

## 2016-03-04 LAB — CBC WITH DIFFERENTIAL/PLATELET
BASOS ABS: 0 10*3/uL (ref 0.0–0.1)
BASOS PCT: 0 %
Eosinophils Absolute: 0.1 10*3/uL (ref 0.0–0.7)
Eosinophils Relative: 1 %
HEMATOCRIT: 37.2 % — AB (ref 39.0–52.0)
Hemoglobin: 12.2 g/dL — ABNORMAL LOW (ref 13.0–17.0)
Lymphocytes Relative: 10 %
Lymphs Abs: 0.6 10*3/uL — ABNORMAL LOW (ref 0.7–4.0)
MCH: 33.5 pg (ref 26.0–34.0)
MCHC: 32.8 g/dL (ref 30.0–36.0)
MCV: 102.2 fL — ABNORMAL HIGH (ref 78.0–100.0)
MONO ABS: 0.5 10*3/uL (ref 0.1–1.0)
Monocytes Relative: 9 %
NEUTROS ABS: 5 10*3/uL (ref 1.7–7.7)
NEUTROS PCT: 80 %
PLATELETS: 153 10*3/uL (ref 150–400)
RBC: 3.64 MIL/uL — AB (ref 4.22–5.81)
RDW: 16.1 % — AB (ref 11.5–15.5)
WBC: 6.3 10*3/uL (ref 4.0–10.5)

## 2016-03-04 LAB — BASIC METABOLIC PANEL
ANION GAP: 8 (ref 5–15)
BUN: 36 mg/dL — AB (ref 6–20)
CALCIUM: 9 mg/dL (ref 8.9–10.3)
CO2: 22 mmol/L (ref 22–32)
CREATININE: 1.12 mg/dL (ref 0.61–1.24)
Chloride: 113 mmol/L — ABNORMAL HIGH (ref 101–111)
GFR calc Af Amer: >60 mL/min (ref >60)
GLUCOSE: 89 mg/dL (ref 65–99)
Potassium: 5.3 mmol/L — ABNORMAL HIGH (ref 3.5–5.1)
Sodium: 143 mmol/L (ref 135–145)

## 2016-03-04 LAB — PROTIME-INR
INR: 3.09 — ABNORMAL HIGH (ref 0.00–1.49)
Prothrombin Time: 31.3 seconds — ABNORMAL HIGH (ref 11.6–15.2)

## 2016-03-04 MED ORDER — HYDROCODONE-ACETAMINOPHEN 5-325 MG PO TABS
1.0000 | ORAL_TABLET | Freq: Once | ORAL | Status: AC
Start: 2016-03-04 — End: 2016-03-04
  Administered 2016-03-04: 1 via ORAL
  Filled 2016-03-04: qty 1

## 2016-03-04 MED ORDER — OXYCODONE HCL 5 MG PO TABS
5.0000 mg | ORAL_TABLET | ORAL | Status: DC | PRN
  Administered 2016-03-07 – 2016-03-16 (×14): 5 mg via ORAL
  Filled 2016-03-04 (×16): qty 1

## 2016-03-04 MED ORDER — MORPHINE SULFATE (PF) 4 MG/ML IV SOLN
4.0000 mg | Freq: Once | INTRAVENOUS | Status: AC
  Administered 2016-03-04: 4 mg via INTRAVENOUS
  Filled 2016-03-04: qty 1

## 2016-03-04 MED ORDER — CARVEDILOL 6.25 MG PO TABS
6.2500 mg | ORAL_TABLET | Freq: Two times a day (BID) | ORAL | Status: DC
Start: 2016-03-04 — End: 2016-03-05
  Administered 2016-03-05 (×2): 6.25 mg via ORAL
  Filled 2016-03-04 (×4): qty 1

## 2016-03-04 MED ORDER — SODIUM CHLORIDE 0.9 % IV SOLN
Freq: Once | INTRAVENOUS | Status: AC
  Administered 2016-03-04: 20:00:00 via INTRAVENOUS

## 2016-03-04 MED ORDER — ENSURE ENLIVE PO LIQD
237.0000 mL | Freq: Two times a day (BID) | ORAL | Status: DC
  Administered 2016-03-05 – 2016-03-16 (×14): 237 mL via ORAL

## 2016-03-04 MED ORDER — PRAVASTATIN SODIUM 20 MG PO TABS
10.0000 mg | ORAL_TABLET | Freq: Every day | ORAL | Status: DC
  Administered 2016-03-05 – 2016-03-16 (×10): 10 mg via ORAL
  Filled 2016-03-04 (×13): qty 1

## 2016-03-04 MED ORDER — IRBESARTAN 300 MG PO TABS
300.0000 mg | ORAL_TABLET | Freq: Every day | ORAL | Status: DC
  Filled 2016-03-04: qty 1

## 2016-03-04 MED ORDER — SODIUM CHLORIDE 0.9% FLUSH
3.0000 mL | Freq: Two times a day (BID) | INTRAVENOUS | Status: DC
  Administered 2016-03-05 – 2016-03-16 (×18): 3 mL via INTRAVENOUS

## 2016-03-04 MED ORDER — WARFARIN SODIUM 2.5 MG PO TABS: 2.5000 mg | ORAL_TABLET | ORAL | Status: DC

## 2016-03-04 MED ORDER — SODIUM CHLORIDE 0.9 % IV SOLN: 250.0000 mL | INTRAVENOUS | Status: DC | PRN

## 2016-03-04 MED ORDER — FERROUS SULFATE 325 (65 FE) MG PO TABS
325.0000 mg | ORAL_TABLET | Freq: Every day | ORAL | Status: DC
  Administered 2016-03-05 – 2016-03-16 (×11): 325 mg via ORAL
  Filled 2016-03-04 (×12): qty 1

## 2016-03-04 MED ORDER — SODIUM CHLORIDE 0.9% FLUSH: 3.0000 mL | INTRAVENOUS | Status: DC | PRN

## 2016-03-04 MED ORDER — FUROSEMIDE 40 MG PO TABS
40.0000 mg | ORAL_TABLET | Freq: Two times a day (BID) | ORAL | Status: DC
  Filled 2016-03-04 (×3): qty 1

## 2016-03-04 MED ORDER — ALLOPURINOL 100 MG PO TABS
100.0000 mg | ORAL_TABLET | Freq: Every day | ORAL | Status: DC
  Administered 2016-03-05 – 2016-03-11 (×6): 100 mg via ORAL
  Filled 2016-03-04 (×7): qty 1

## 2016-03-04 MED ORDER — HYDROMORPHONE HCL 1 MG/ML IJ SOLN
0.5000 mg | INTRAMUSCULAR | Status: DC | PRN
  Administered 2016-03-06 – 2016-03-08 (×4): 1 mg via INTRAVENOUS
  Filled 2016-03-04 (×5): qty 1

## 2016-03-04 MED ORDER — ONDANSETRON HCL 4 MG/2ML IJ SOLN
4.0000 mg | Freq: Once | INTRAMUSCULAR | Status: AC
  Administered 2016-03-04: 4 mg via INTRAVENOUS
  Filled 2016-03-04: qty 2

## 2016-03-04 MED ORDER — ACETAMINOPHEN 325 MG PO TABS: 650.0000 mg | ORAL_TABLET | Freq: Four times a day (QID) | ORAL | Status: DC | PRN

## 2016-03-04 MED ORDER — ACETAMINOPHEN 650 MG RE SUPP: 650.0000 mg | Freq: Four times a day (QID) | RECTAL | Status: DC | PRN

## 2016-03-04 MED ORDER — ONDANSETRON HCL 4 MG PO TABS: 4.0000 mg | ORAL_TABLET | Freq: Four times a day (QID) | ORAL | Status: DC | PRN

## 2016-03-04 MED ORDER — ONDANSETRON HCL 4 MG/2ML IJ SOLN: 4.0000 mg | Freq: Four times a day (QID) | INTRAMUSCULAR | Status: DC | PRN

## 2016-03-04 NOTE — ED Provider Notes (Signed)
CSN: [id number]     Arrival date & time 03/04/16  1544 History   First MD Initiated Contact with Patient 03/04/16 1546     Chief Complaint  Patient presents with  . Leg Pain     (Consider location/radiation/quality/duration/timing/severity/associated sxs/prior Treatment) HPI   Blood pressure 126/57, pulse 63, temperature 98.3 F (36.8 C), temperature source Oral, resp. rate 18, SpO2 94 %.  [name] is a 70 y.o. male BIBEMS from home with past medical history significant for CHF, alcoholism, chronically anticoagulated for A. fib complaining of pain to right leg from hip to ankle onset 2 days ago after mechanical fall off the stool. There was no head trauma. He states initially he didn't feel pain within the next morning he when he woke up it was severe. Nonambulatory at home. States he's had multiple falls have required stayed at nursing home. Patient denies chest pain, palpitations, shortness of breath, change in vision, nausea vomiting.  Past Medical History  Diagnosis Date  . Cardiomyopathy     nonischemic  . CHF (congestive heart failure) (HCC)     ejection fractio of 45% per echo in 2011 (previos EF 35-40% in 2003), echo 04/2013  -LVEF 50-55%.  . Alcoholism (HCC)   . Atrial fibrillation or flutter   . Hypertension   . Obesity   . Ventricular tachycardia (HCC)     nonsustained, asymptomatic  . Anemia   . Hypoxemia   . GERD (gastroesophageal reflux disease)     takes tums prn  . Complication of anesthesia     April 11, 2013   Past Surgical History  Procedure Laterality Date  . Tonsillectomy    . Knee bursectomy Right 04/20/2013    Procedure: Right knee prepatella bursa decompression;  Surgeon: Cammy Copa, MD;  Location: [facility];  Service: Orthopedics;  Laterality: Right;  . I&d extremity Right 06/28/2013    Procedure: IRRIGATION AND DEBRIDEMENT OF RIGHT LEG WITH SURGICAL PREP/PLACEMENT OF ACELL ;  Surgeon: Wayland Denis, DO;  Location: [facility] OR;  Service: Plastics;   Laterality: Right;   Family History  Problem Relation Age of Onset  . COPD Mother   . Hypertension Father    Social History  Substance Use Topics  . Smoking status: Former Smoker    Start date: 06/29/1987  . Smokeless tobacco: Never Used     Comment: stopped in 1985  . Alcohol Use: 6.0 oz/week    10 Cans of beer per week    Review of Systems  10 systems reviewed and found to be negative, except as noted in the HPI.  Allergies  Review of patient's allergies indicates no known allergies.  Home Medications   Prior to Admission medications   Medication Sig Start Date End Date Taking? Authorizing Provider  acetaminophen (TYLENOL) 325 MG tablet Take 2 tablets (650 mg total) by mouth every 6 (six) hours as needed for mild pain, moderate pain, fever or headache (or Fever >/= 101). 10/25/15   Elease Etienne, MD  Amino Acids-Protein Hydrolys (FEEDING SUPPLEMENT, PRO-STAT SUGAR FREE 64,) LIQD Take 30 mLs by mouth 2 (two) times daily. 10/25/15   Elease Etienne, MD  carvedilol (COREG) 3.125 MG tablet Take 1 tablet (3.125 mg total) by mouth 2 (two) times daily with a meal. 10/25/15   Elease Etienne, MD  Cyanocobalamin (VITAMIN B-12 PO) Take 1 tablet by mouth daily.    Historical Provider, MD  feeding supplement, ENSURE ENLIVE, (ENSURE ENLIVE) LIQD Take 237 mLs by mouth  2 (two) times daily between meals. 10/25/15   Elease Etienne, MD  ferrous sulfate 325 (65 FE) MG tablet Take 1 tablet by mouth daily.    Historical Provider, MD  furosemide (LASIX) 40 MG tablet Take 1 tablet (40 mg total) by mouth 2 (two) times daily. 10/26/15   Elease Etienne, MD  polyethylene glycol (MIRALAX / GLYCOLAX) packet Take 17 g by mouth daily. 10/25/15   Elease Etienne, MD  potassium chloride SA (K-DUR,KLOR-CON) 20 MEQ tablet Take 1 tablet (20 mEq total) by mouth daily. 10/25/15   Elease Etienne, MD  pravastatin (PRAVACHOL) 10 MG tablet Take 10 mg by mouth daily.    Historical Provider, MD  senna-docusate  (SENOKOT-S) 8.6-50 MG tablet Take 2 tablets by mouth at bedtime as needed for mild constipation or moderate constipation. 10/25/15   Elease Etienne, MD  vitamin C (ASCORBIC ACID) 500 MG tablet Take 500 mg by mouth daily.    Historical Provider, MD  warfarin (COUMADIN) 5 MG tablet Take 0.5 tablets (2.5 mg total) by mouth daily at 6 PM. 10/26/15   Elease Etienne, MD   BP 126/57 mmHg  Pulse 63  Temp(Src) 98.3 F (36.8 C) (Oral)  Resp 18  SpO2 94% Physical Exam  Constitutional: He is oriented to person, place, and time. He appears well-developed and well-nourished. No distress.  Obese, poor hygiene, appears older than stated age  HENT:  Head: Normocephalic.  Right eye ptosis which is chronic  Eyes: Conjunctivae and EOM are normal. Pupils are equal, round, and reactive to light.  Cardiovascular: Normal heart sounds.   Irregularly irregular   Pulmonary/Chest: Effort normal. No stridor. No respiratory distress. He has no wheezes. He has no rales. He exhibits no tenderness.  Reduced air movement at the bases  Abdominal: Soft. Bowel sounds are normal.  Musculoskeletal: Normal range of motion. He exhibits edema. He exhibits no tenderness.  Significant chronic lower extremity edema   cannot lift left lower extremity up off the bed  Left leg diffusely tender to palpation, no focal bony tenderness.  Neurological: He is alert and oriented to person, place, and time.  Skin:  Partial thickness abrasion to left elbow with no warmth, tenderness palpation or discharge, full range of motion to elbow.  Psychiatric: He has a normal mood and affect.  Nursing note and vitals reviewed.   ED Course  Procedures (including critical care time) Labs Review Labs Reviewed  CBC WITH DIFFERENTIAL/PLATELET  BASIC METABOLIC PANEL  PROTIME-INR    Imaging Review No results found. I have personally reviewed and evaluated these images and lab results as part of my medical decision-making.   EKG  Interpretation None      MDM   Final diagnoses:  Weakness of both lower extremities    Filed Vitals:   03/04/16 1545  BP: 126/57  Pulse: 63  Temp: 98.3 F (36.8 C)  TempSrc: Oral  Resp: 18  SpO2: 94%    Medications  HYDROcodone-acetaminophen (NORCO/VICODIN) 5-325 MG per tablet 1 tablet (not administered)    [name] is 70 y.o. male presenting with significant right lower extremity pain status post mechanical fall several days ago, this patient is not ambulatory, I am concerned about his ability to care for himself home, family members are here and they state that they also were worried and cannot care for him. X-rays are without acute abnormality however his potassium is elevated at 5.3. INR therapeutic at 3.09. Portable chest without acute abnormalities mild  interstitial edema, EKG with no signs of hyperkalemia.  Case discussed with Dr. Lovell Sheehan who accepts admission.     Wynetta Emery, PA-C 03/04/16 2103  Mancel Bale, MD 03/05/16 [phone]

## 2016-03-04 NOTE — ED Notes (Signed)
Patient's brother - [phone] - would like to be informed if patient is admitted.

## 2016-03-04 NOTE — ED Notes (Signed)
Bed: WA08 Expected date:  Expected time:  Means of arrival:  Comments: [name]

## 2016-03-04 NOTE — ED Notes (Signed)
Patient transported by [facility] from home.  Patient fell 3 days ago and is c/o bilateral lower leg pain.  Patient has swelling to BLE.  Patient has been in bed since fall.  Patient has skin tear to left forearm.  No other injuries noted.

## 2016-03-04 NOTE — Progress Notes (Signed)
ANTICOAGULATION CONSULT NOTE - Initial Consult  Pharmacy Consult for Warfarin Indication: atrial fibrillation  No Known Allergies  Patient Measurements:   TBW: weight in January 150kg  Vital Signs: Temp: 98.3 F (36.8 C) (05/24 1545) Temp Source: Oral (05/24 1545) BP: 124/87 mmHg (05/24 1930) Pulse Rate: 72 (05/24 1930)  Labs:  Recent Labs  03/04/16 1635  HGB 12.2*  HCT 37.2*  PLT 153  LABPROT 31.3*  INR 3.09*  CREATININE 1.12   CrCl cannot be calculated (Unknown ideal weight.).  Medical History: Past Medical History  Diagnosis Date  . Cardiomyopathy     nonischemic  . CHF (congestive heart failure) (HCC)     ejection fractio of 45% per echo in 2011 (previos EF 35-40% in 2003), echo 04/2013  -LVEF 50-55%.  . Alcoholism (HCC)   . Atrial fibrillation or flutter   . Hypertension   . Obesity   . Ventricular tachycardia (HCC)     nonsustained, asymptomatic  . Anemia   . Hypoxemia   . GERD (gastroesophageal reflux disease)     takes tums prn  . Complication of anesthesia     April 11, 2013   Medications:  Scheduled:   Assessment: 62 yoM to ED 5/24 with bilateral LE weakness and swelling, RLE pain s/p fall at home 3 days ago. No fx per Xray. Chronic Warfarin for Afib: Home dose 2.5mg  MWF, 5mg  other days,LD 5/23 (2000)  Admit INR 3.09  Home dose lower than expected for large body habitus   Goal of Therapy:  INR 2-3 Monitor platelets by anticoagulation protocol: Yes   Plan:   No Warfarin tonight  Daily Protime/INR  Monitor CBC, sign/sx bleed  Otho Bellows PharmD Pager [phone] 03/04/2016, 8:57 PM

## 2016-03-04 NOTE — H&P (Addendum)
[facility] Admission History and Physical       DEEP BONAWITZ ZOX:[id number] DOB: [birth year] DOA: 03/04/2016  Referring physician: EDP PCP: [name], MD  Specialists:   Chief Complaint: Weakness of Both Legs  HPI: [name] is a 70 y.o. male with a history of Systolic CHF, Atrial Fibrillation on Coumadin Rx, HTN, Anemia who presents to the ED with complaints of severe weakness of Both lower legs and increased pain in his Right lower leg x 3 days following a mechanical fall.  Patient reports that he was fine after the fall and the next days he could not walk.  In the ED, X-rays were performed and were negative for evidence of acute fractures and he was referred for admission and placed in observation status.        Review of Systems:   Constitutional: No Weight Loss, No Weight Gain, Night Sweats, Fevers, Chills, Dizziness, Light Headedness, Fatigue, or Generalized Weakness HEENT: No Headaches, Difficulty Swallowing,Tooth/Dental Problems,Sore Throat,  No Sneezing, Rhinitis, Ear Ache, Nasal Congestion, or Post Nasal Drip,  Cardio-vascular:  No Chest pain, Orthopnea, PND, Edema in Lower Extremities, Anasarca, Dizziness, Palpitations  Resp: No Dyspnea, No DOE, No Productive Cough, No Non-Productive Cough, No Hemoptysis, No Wheezing.    GI: No Heartburn, Indigestion, Abdominal Pain, Nausea, Vomiting, Diarrhea, Constipation, Hematemesis, Hematochezia, Melena, Change in Bowel Habits,  Loss of Appetite  GU: No Dysuria, No Change in Color of Urine, No Urgency or Urinary Frequency, No Flank pain.  Musculoskeletal: No Joint Pain or Swelling, No Decreased Range of Motion, No Back Pain.  Neurologic: No Syncope, No Seizures, Muscle Weakness, Paresthesia, Vision Disturbance or Loss, No Diplopia, No Vertigo,+Difficulty Walking,  Skin: No Rash or Lesions. Psych: No Change in Mood or Affect, No Depression or Anxiety, No Memory loss, No Confusion, or Hallucinations   Past Medical  History  Diagnosis Date  . Cardiomyopathy     nonischemic  . CHF (congestive heart failure) (HCC)     ejection fractio of 45% per echo in 2011 (previos EF 35-40% in 2003), echo 04/2013  -LVEF 50-55%.  . Alcoholism (HCC)   . Atrial fibrillation or flutter   . Hypertension   . Obesity   . Ventricular tachycardia (HCC)     nonsustained, asymptomatic  . Anemia   . Hypoxemia   . GERD (gastroesophageal reflux disease)     takes tums prn  . Complication of anesthesia     April 11, 2013     Past Surgical History  Procedure Laterality Date  . Tonsillectomy    . Knee bursectomy Right 04/20/2013    Procedure: Right knee prepatella bursa decompression;  Surgeon: Cammy Copa, MD;  Location: [facility] OR;  Service: Orthopedics;  Laterality: Right;  . I&d extremity Right 06/28/2013    Procedure: IRRIGATION AND DEBRIDEMENT OF RIGHT LEG WITH SURGICAL PREP/PLACEMENT OF ACELL ;  Surgeon: Wayland Denis, DO;  Location: [facility] OR;  Service: Plastics;  Laterality: Right;      Prior to Admission medications   Medication Sig Start Date End Date Taking? Authorizing Provider  allopurinol (ZYLOPRIM) 100 MG tablet Take 100 mg by mouth daily.  01/14/16  Yes Historical Provider, MD  carvedilol (COREG) 6.25 MG tablet Take 6.25 mg by mouth 2 (two) times daily. 02/12/16  Yes Historical Provider, MD  feeding supplement, ENSURE ENLIVE, (ENSURE ENLIVE) LIQD Take 237 mLs by mouth 2 (two) times daily between meals. 10/25/15  Yes Elease Etienne, MD  ferrous sulfate 325 (65 FE) MG  tablet Take 1 tablet by mouth daily.   Yes Historical Provider, MD  furosemide (LASIX) 40 MG tablet Take 1 tablet (40 mg total) by mouth 2 (two) times daily. 10/26/15  Yes Elease Etienne, MD  olmesartan (BENICAR) 40 MG tablet Take 40 mg by mouth daily. 02/10/16  Yes Historical Provider, MD  potassium chloride SA (K-DUR,KLOR-CON) 20 MEQ tablet Take 1 tablet (20 mEq total) by mouth daily. 10/25/15  Yes Elease Etienne, MD  pravastatin (PRAVACHOL) 10 MG  tablet Take 10 mg by mouth daily.   Yes Historical Provider, MD  vitamin C (ASCORBIC ACID) 500 MG tablet Take 500 mg by mouth daily.   Yes Historical Provider, MD  warfarin (COUMADIN) 5 MG tablet Take 0.5 tablets (2.5 mg total) by mouth daily at 6 PM. Patient taking differently: Take 2.5-5 mg by mouth as directed. Take 1 tablet (5 mg) on Sun, Tues, Thurs, Sat and Take 0.5 tablet (2.5 mg) on MWF 10/26/15  Yes Elease Etienne, MD  acetaminophen (TYLENOL) 325 MG tablet Take 2 tablets (650 mg total) by mouth every 6 (six) hours as needed for mild pain, moderate pain, fever or headache (or Fever >/= 101). 10/25/15   Elease Etienne, MD  Amino Acids-Protein Hydrolys (FEEDING SUPPLEMENT, PRO-STAT SUGAR FREE 64,) LIQD Take 30 mLs by mouth 2 (two) times daily. Patient not taking: Reported on 03/04/2016 10/25/15   Elease Etienne, MD  carvedilol (COREG) 3.125 MG tablet Take 1 tablet (3.125 mg total) by mouth 2 (two) times daily with a meal. Patient not taking: Reported on 03/04/2016 10/25/15   Elease Etienne, MD  polyethylene glycol (MIRALAX / GLYCOLAX) packet Take 17 g by mouth daily. Patient not taking: Reported on 03/04/2016 10/25/15   Elease Etienne, MD  senna-docusate (SENOKOT-S) 8.6-50 MG tablet Take 2 tablets by mouth at bedtime as needed for mild constipation or moderate constipation. 10/25/15   Elease Etienne, MD     No Known Allergies  Social History:  reports that he has quit smoking. His smoking use included Cigarettes. He started smoking about 28 years ago. He has never used smokeless tobacco. He reports that he drinks about 6.0 oz of alcohol per week. He reports that he does not use illicit drugs.    Family History  Problem Relation Age of Onset  . COPD Mother   . Hypertension Father        Physical Exam:  GEN:  Pleasant Elderly Obese 70 y.o. Caucasian male examined and in no acute distress; cooperative with exam Filed Vitals:   03/04/16 1545 03/04/16 1930  BP: 126/57 124/87    Pulse: 63 72  Temp: 98.3 F (36.8 C)   TempSrc: Oral   Resp: 18 18  SpO2: 94% 95%   Blood pressure 124/87, pulse 72, temperature 98.3 F (36.8 C), temperature source Oral, resp. rate 18, SpO2 95 %. PSYCH: He is alert and oriented x4; does not appear anxious does not appear depressed; affect is normal HEENT: Normocephalic and Atraumatic, Mucous membranes pink; PERRLA; EOM intact; Fundi:  Benign;  No scleral icterus, Nares: Patent, Oropharynx: Clear,Sparse Dentition,    Neck:  FROM, No Cervical Lymphadenopathy nor Thyromegaly or Carotid Bruit; No JVD; Breasts:: Not examined CHEST WALL: No tenderness CHEST: Normal respiration, clear to auscultation bilaterally HEART: Regular rate and rhythm; no murmurs rubs or gallops BACK: No kyphosis or scoliosis; No CVA tenderness ABDOMEN: Positive Bowel Sounds, Obese, Soft Non-Tender, No Rebound or Guarding; No Masses, No Organomegaly, +Pannus; No Intertriginous  candida. Rectal Exam: Not done EXTREMITIES: No Cyanosis, Clubbing, +Venous Insufficiency BLEs; No Ulcerations. Genitalia: not examined PULSES: 2+ and symmetric SKIN: Normal hydration no rash or ulceration CNS:  Alert and Oriented x 4, No Focal Deficits Vascular: pulses palpable throughout    Labs on Admission:  Basic Metabolic Panel:  Recent Labs Lab 03/04/16 1635  NA 143  K 5.3*  CL 113*  CO2 22  GLUCOSE 89  BUN 36*  CREATININE 1.12  CALCIUM 9.0   Liver Function Tests: No results for input(s): AST, ALT, ALKPHOS, BILITOT, PROT, ALBUMIN in the last 168 hours. No results for input(s): LIPASE, AMYLASE in the last 168 hours. No results for input(s): AMMONIA in the last 168 hours. CBC:  Recent Labs Lab 03/04/16 1635  WBC 6.3  NEUTROABS 5.0  HGB 12.2*  HCT 37.2*  MCV 102.2*  PLT 153   Cardiac Enzymes: No results for input(s): CKTOTAL, CKMB, CKMBINDEX, TROPONINI in the last 168 hours.  BNP (last 3 results) No results for input(s): BNP in the last 8760 hours.  ProBNP  (last 3 results) No results for input(s): PROBNP in the last 8760 hours.  CBG: No results for input(s): GLUCAP in the last 168 hours.  Radiological Exams on Admission: Dg Lumbar Spine Complete  03/04/2016  CLINICAL DATA:  While sitting on the bench 3 days ago, the bench broke and the patient fell. Low back pain. EXAM: LUMBAR SPINE - COMPLETE 4+ VIEW COMPARISON:  None. FINDINGS: There is thoracolumbar curvature convex to the left and lower lumbar curvature convex to the right. There is degenerative disc disease and some facet degeneration throughout the region. Lateral views are not of any use due to underpenetration. There is no high grade compression fracture or burst fracture. Cannot rule out minor endplate fractures in the region. According to the technologist's note, the patient was not able to be positioned because of pain. If there is clinical concern, CT would be suggested. IMPRESSION: Thoracolumbar curvature. Chronic degenerative disc disease and degenerative facet disease. No high grade compression fracture. Cannot rule out minor endplate fracture. Lateral views are nondiagnostic. Electronically Signed   By: Paulina Fusi M.D.   On: 03/04/2016 19:51   Dg Ankle Complete Right  03/04/2016  CLINICAL DATA:  sat on bench and it broke three days ago. Pt complains of right leg pain in his hip, knee, and ankle. Pt was not specific. Pt did not complain of low back pain. Pt was not able to lay on his side for lumbar lateral films so x-tables were attempted. Multiple techniques were tried and tube was maxed out and still unable to penetrate due to pt body habitus. EXAM: RIGHT ANKLE - COMPLETE 3+ VIEW COMPARISON:  04/11/2013 FINDINGS: There is no evidence of fracture, dislocation, or joint effusion. Mild progressive degenerative changes in the midfoot. Large calcaneal spurs as before. There is no evidence of arthropathy or other focal bone abnormality. Soft tissues are unremarkable. IMPRESSION: 1. Negative  for fracture or other acute bone abnormality. Electronically Signed   By: Corlis Leak M.D.   On: 03/04/2016 17:38   Dg Chest Port 1 View  03/04/2016  CLINICAL DATA:  Cough EXAM: PORTABLE CHEST 1 VIEW COMPARISON:  10/20/2015 FINDINGS: Chronic cardiopericardial enlargement. Stable aortic and hilar contours. There is increased diffuse interstitial opacity. No convincing pleural fluid. No pneumothorax. Marked osteopenia. IMPRESSION: Acute interstitial opacities, favored congestive over bronchitic. Electronically Signed   By: Marnee Spring M.D.   On: 03/04/2016 18:46   Dg Knee Complete  4 Views Right  03/04/2016  CLINICAL DATA:  Sat on bench and it broke three days ago. Pt complains of right leg pain in his hip, knee, and ankle. Pt was not specific. Pt did not complain of low back pain. Pt was not able to lay on his side for lumbar lateral films so x-tables were attempted. Multiple techniques were tried and tube was maxed out and still unable to penetrate due to pt body habitus. EXAM: RIGHT KNEE - COMPLETE 4+ VIEW COMPARISON:  None. FINDINGS: No evidence of fracture, dislocation. Effusion in the suprapatellar bursa. Small marginal spurs from the lateral femoral condyles and medial tibial plateau. No evidence of arthropathy or other focal bone abnormality. Soft tissues are unremarkable. IMPRESSION: 1. Small effusion with no evidence of fracture or dislocation. 2. Early degenerative spurring in medial and lateral compartments. Electronically Signed   By: Corlis Leak M.D.   On: 03/04/2016 17:36   Dg Hip Unilat With Pelvis 2-3 Views Right  03/04/2016  CLINICAL DATA:  sat on bench and it broke three days ago. Pt complains of right leg pain in his hip, knee, and ankle. Pt was not specific. Pt did not complain of low back pain. Pt was not able to lay on his side for lumbar lateral films so x-tables were attempted. Multiple techniques were tried and tube was maxed out and still unable to penetrate due to pt body habitus.  EXAM: DG HIP (WITH OR WITHOUT PELVIS) 2-3V RIGHT COMPARISON:  CT 04/22/2013 FINDINGS: Mild narrowing of the arch scratch the moderate narrowing of the articular cartilage on the right, mild narrowing on the left. Small marginal spurs from the acetabulum on the right. Negative for fracture or dislocation. Degenerative disc disease in the visualized lower lumbar spine. Bilateral pelvic vascular calcifications. IMPRESSION: 1. Negative for fracture, dislocation, or other acute bone abnormality. 2. Bilateral hip DJD, right greater than left. 3. Lower lumbar degenerative disc disease. Electronically Signed   By: Corlis Leak M.D.   On: 03/04/2016 17:34      Assessment/Plan:      70 y.o. male with  Principal Problem:    Weakness of both lower extremities   MRI of Lumbar Spine in AM   Physical Therapy Evaluation afterward if Non-Acute   Supportive Care      Active Problems:  Frequent Falls   Fall Precautions   Physical Therapy Evaluation      Essential hypertension   Continue Carvedilol, and Olmesartan Rx   Monitor BPs      Chronic atrial fibrillation (HCC)   Continue Carvedilol, and Coumadin Rx      Anemia of chronic disease   Monitor Hb Trend      Chronic systolic CHF (congestive heart failure) (HCC)   Continue Lasix, Carvedilol, Olmesartan Rx   Monitor I/Os     Chronic Anticoagulation- on Coumadin   Monitor PT/INR and Adjust PRN    DVT Prophylaxis   On Coumadin Rx     Code Status:     FULL CODE       Family Communication:  No Family Present    Disposition Plan:     Observation Status        Time spent:  5 Minutes      Ron Parker [facility] Pager [phone]   If 7AM -7PM Please Contact the Day Rounding Team MD for [facility]  If 7PM-7AM, Please Contact Night-Floor Coverage  [email or web address] Password [email or web address] 03/04/2016, 8:05 PM     ADDENDUM:  Patient was seen and examined on 03/04/2016

## 2016-03-04 NOTE — ED Provider Notes (Signed)
  Face-to-face evaluation   History: [name] is a 70 y.o. male who is here for evaluation of right leg weakness and pain since a fall days ago. He lives with his debilitated father. He has been able to get to the bathroom since the fall, but has called to doing that. He denies fever, vomiting, or dizziness.   Physical exam: Debilitated male who appears much older than stated age. Legs with moderate swelling, right greater than left with chronic edema appearance bilaterally. Unable to lift right right leg from stretcher, while supine. Normal toe motion bilaterally.  Medical screening examination/treatment/procedure(s) were conducted as a shared visit with non-physician practitioner(s) and myself.  I personally evaluated the patient during the encounter  Mancel Bale, MD 03/05/16 [phone]

## 2016-03-05 ENCOUNTER — Ambulatory Visit (HOSPITAL_COMMUNITY)
Admit: 2016-03-05 | Discharge: 2016-03-05 | Disposition: A | Discharge status: Home / Self Care | Payer: Medicare HMO | Attending: Internal Medicine | Admitting: Internal Medicine

## 2016-03-05 ENCOUNTER — Ambulatory Visit (HOSPITAL_COMMUNITY)
Admission: RE | Admit: 2016-03-05 | Discharge: 2016-03-05 | Disposition: A | Discharge status: Home / Self Care | Payer: Medicare HMO | Source: Ambulatory Visit | Attending: Internal Medicine | Admitting: Internal Medicine

## 2016-03-05 ENCOUNTER — Observation Stay (HOSPITAL_COMMUNITY): Payer: Medicare HMO

## 2016-03-05 DIAGNOSIS — R5381 Other malaise: Secondary | ICD-10-CM | POA: Diagnosis not present

## 2016-03-05 DIAGNOSIS — R627 Adult failure to thrive: Secondary | ICD-10-CM | POA: Diagnosis not present

## 2016-03-05 DIAGNOSIS — I482 Chronic atrial fibrillation: Secondary | ICD-10-CM | POA: Diagnosis not present

## 2016-03-05 DIAGNOSIS — J9602 Acute respiratory failure with hypercapnia: Secondary | ICD-10-CM | POA: Diagnosis not present

## 2016-03-05 DIAGNOSIS — I519 Heart disease, unspecified: Secondary | ICD-10-CM | POA: Diagnosis not present

## 2016-03-05 DIAGNOSIS — R531 Weakness: Secondary | ICD-10-CM | POA: Insufficient documentation

## 2016-03-05 DIAGNOSIS — J9622 Acute and chronic respiratory failure with hypercapnia: Secondary | ICD-10-CM | POA: Diagnosis not present

## 2016-03-05 DIAGNOSIS — M1000 Idiopathic gout, unspecified site: Secondary | ICD-10-CM | POA: Diagnosis not present

## 2016-03-05 DIAGNOSIS — J189 Pneumonia, unspecified organism: Secondary | ICD-10-CM | POA: Diagnosis not present

## 2016-03-05 DIAGNOSIS — Z7901 Long term (current) use of anticoagulants: Secondary | ICD-10-CM | POA: Diagnosis not present

## 2016-03-05 LAB — CBC
HCT: 36.4 % — ABNORMAL LOW (ref 39.0–52.0)
HEMOGLOBIN: 11.6 g/dL — AB (ref 13.0–17.0)
MCH: 34 pg (ref 26.0–34.0)
MCHC: 31.9 g/dL (ref 30.0–36.0)
MCV: 106.7 fL — ABNORMAL HIGH (ref 78.0–100.0)
PLATELETS: 145 10*3/uL — AB (ref 150–400)
RBC: 3.41 MIL/uL — AB (ref 4.22–5.81)
RDW: 16.5 % — ABNORMAL HIGH (ref 11.5–15.5)
WBC: 6.5 10*3/uL (ref 4.0–10.5)

## 2016-03-05 LAB — GLUCOSE, CAPILLARY: GLUCOSE-CAPILLARY: 132 mg/dL — AB (ref 65–99)

## 2016-03-05 LAB — URINALYSIS, ROUTINE W REFLEX MICROSCOPIC
Bilirubin Urine: NEGATIVE
Glucose, UA: NEGATIVE mg/dL
Ketones, ur: NEGATIVE mg/dL
NITRITE: NEGATIVE
PROTEIN: NEGATIVE mg/dL
SPECIFIC GRAVITY, URINE: 1.018 (ref 1.005–1.030)
pH: 5 (ref 5.0–8.0)

## 2016-03-05 LAB — BASIC METABOLIC PANEL
ANION GAP: 5 (ref 5–15)
BUN: 36 mg/dL — ABNORMAL HIGH (ref 6–20)
CALCIUM: 8.7 mg/dL — AB (ref 8.9–10.3)
CO2: 23 mmol/L (ref 22–32)
CREATININE: 1.1 mg/dL (ref 0.61–1.24)
Chloride: 115 mmol/L — ABNORMAL HIGH (ref 101–111)
Glucose, Bld: 117 mg/dL — ABNORMAL HIGH (ref 65–99)
Potassium: 5.9 mmol/L — ABNORMAL HIGH (ref 3.5–5.1)
SODIUM: 143 mmol/L (ref 135–145)

## 2016-03-05 LAB — MRSA PCR SCREENING: MRSA BY PCR: POSITIVE — AB

## 2016-03-05 LAB — URINE MICROSCOPIC-ADD ON

## 2016-03-05 LAB — PROTIME-INR
INR: 3.19 — ABNORMAL HIGH (ref 0.00–1.49)
PROTHROMBIN TIME: 32 s — AB (ref 11.6–15.2)

## 2016-03-05 MED ORDER — DEXTROSE 5 % IV SOLN
500.0000 mg | INTRAVENOUS | Status: DC
  Administered 2016-03-05: 500 mg via INTRAVENOUS
  Administered 2016-03-06: 17:00:00 via INTRAVENOUS
  Administered 2016-03-07 – 2016-03-13 (×7): 500 mg via INTRAVENOUS
  Filled 2016-03-05 (×10): qty 500

## 2016-03-05 MED ORDER — HEPARIN SODIUM (PORCINE) 5000 UNIT/ML IJ SOLN: 5000.0000 [IU] | Freq: Three times a day (TID) | INTRAMUSCULAR | Status: DC

## 2016-03-05 MED ORDER — MUPIROCIN 2 % EX OINT
1.0000 "application " | TOPICAL_OINTMENT | Freq: Two times a day (BID) | CUTANEOUS | Status: AC
  Administered 2016-03-05 – 2016-03-09 (×10): 1 via NASAL
  Filled 2016-03-05 (×2): qty 22

## 2016-03-05 MED ORDER — SODIUM CHLORIDE 0.9 % IV BOLUS (SEPSIS)
500.0000 mL | Freq: Once | INTRAVENOUS | Status: AC
  Administered 2016-03-05: 500 mL via INTRAVENOUS

## 2016-03-05 MED ORDER — CHLORHEXIDINE GLUCONATE CLOTH 2 % EX PADS
6.0000 | MEDICATED_PAD | Freq: Every day | CUTANEOUS | Status: DC
  Administered 2016-03-05 – 2016-03-06 (×2): 6 via TOPICAL

## 2016-03-05 MED ORDER — DEXTROSE 5 % IV SOLN
1.0000 g | INTRAVENOUS | Status: DC
  Administered 2016-03-05: 1 g via INTRAVENOUS
  Administered 2016-03-06: 17:00:00 via INTRAVENOUS
  Administered 2016-03-07 – 2016-03-13 (×7): 1 g via INTRAVENOUS
  Filled 2016-03-05 (×10): qty 10

## 2016-03-05 MED ORDER — CETYLPYRIDINIUM CHLORIDE 0.05 % MT LIQD
7.0000 mL | Freq: Two times a day (BID) | OROMUCOSAL | Status: DC
  Administered 2016-03-05 – 2016-03-16 (×18): 7 mL via OROMUCOSAL

## 2016-03-05 MED ORDER — DEXTROSE 50 % IV SOLN
1.0000 | Freq: Once | INTRAVENOUS | Status: AC
  Administered 2016-03-05: 50 mL via INTRAVENOUS
  Filled 2016-03-05: qty 50

## 2016-03-05 MED ORDER — CHLORHEXIDINE GLUCONATE 0.12 % MT SOLN
15.0000 mL | Freq: Two times a day (BID) | OROMUCOSAL | Status: DC
  Administered 2016-03-05 – 2016-03-16 (×21): 15 mL via OROMUCOSAL
  Filled 2016-03-05 (×20): qty 15

## 2016-03-05 MED ORDER — INSULIN ASPART 100 UNIT/ML IV SOLN
10.0000 [IU] | Freq: Once | INTRAVENOUS | Status: AC
  Administered 2016-03-05: 10 [IU] via INTRAVENOUS

## 2016-03-05 NOTE — Progress Notes (Signed)
Nutrition Brief Note  Patient identified on the [facility] report.  Wt Readings from Last 15 Encounters:  10/25/15 331 lb 12.7 oz (150.5 kg)  10/14/15 315 lb (142.883 kg)  06/24/15 294 lb 1.6 oz (133.403 kg)  02/13/15 322 lb (146.058 kg)  02/05/15 320 lb (145.151 kg)  09/20/13 263 lb (119.296 kg)  06/28/13 266 lb (120.657 kg)  04/25/13 273 lb 5.9 oz (124 kg)  04/18/13 285 lb 9.6 oz (129.547 kg)  04/14/13 278 lb 10.6 oz (126.4 kg)  06/28/12 299 lb 12.8 oz (135.988 kg)  04/24/11 308 lb 6.4 oz (139.889 kg)  06/11/10 318 lb (144.244 kg)  12/09/09 373 lb (169.192 kg)    There is no weight on file to calculate BMI.  Based on previous weight of 331# and patient stated height of 6'1 pt's BMI is 43.97 Patient meets criteria for obese class III based on current BMI.   Presents with leg weakness after mechanical fall.  Pt had breakfast this morning, did not like the egg whites provided but ate ok otherwise. PO intake at home good per patient. Normally does 2 meals/day with snacks. Denies weight loss. Denies issues chewing/swallowing Denies nausea/vomiting   Per chart review he has some wt fluctuations, some of which may be edema related to chronic systolic CHF. No new weight currently.  Current diet order is Heart Healthy, patient is consuming approximately 60-75% of meals at this time. Labs and medications reviewed.   No nutrition interventions warranted at this time. If nutrition issues arise, please consult RD.   Dionne Ano. [place] Brogden, MS, RD LDN Inpatient Clinical Dietitian Pager [phone]

## 2016-03-05 NOTE — Progress Notes (Signed)
Spoke with Theresa Duty about medications that were scheduled for this patient at 1700 & 1800. Stated to give them now & put in chart as late.

## 2016-03-05 NOTE — Progress Notes (Signed)
ANTICOAGULATION CONSULT NOTE -   Pharmacy Consult for Warfarin Indication: atrial fibrillation  No Known Allergies  Patient Measurements:   TBW: weight in January 150kg  Vital Signs: Temp: 97.5 F (36.4 C) (05/25 1329) Temp Source: Oral (05/25 1329) BP: 99/55 mmHg (05/25 1329) Pulse Rate: 66 (05/25 1329)  Labs:  Recent Labs  03/04/16 1635 03/05/16 0415  HGB 12.2* 11.6*  HCT 37.2* 36.4*  PLT 153 145*  LABPROT 31.3* 32.0*  INR 3.09* 3.19*  CREATININE 1.12 1.10   CrCl cannot be calculated (Unknown ideal weight.).  Medical History: Past Medical History  Diagnosis Date  . Cardiomyopathy     nonischemic  . CHF (congestive heart failure) (HCC)     ejection fractio of 45% per echo in 2011 (previos EF 35-40% in 2003), echo 04/2013  -LVEF 50-55%.  . Alcoholism (HCC)   . Atrial fibrillation or flutter   . Hypertension   . Obesity   . Ventricular tachycardia (HCC)     nonsustained, asymptomatic  . Anemia   . Hypoxemia   . GERD (gastroesophageal reflux disease)     takes tums prn  . Complication of anesthesia     April 11, 2013   Medications:  Scheduled:   Assessment: 58 yoM to ED 5/24 with bilateral LE weakness and swelling, RLE pain s/p fall at home 3 days ago. No fx per Xray. Chronic Warfarin for Afib: Home dose 2.5mg  MWF, 5mg  other days,LD 5/23 (2000)  Admit INR 3.09  Home dose lower than expected for large body habitus  03/05/2016 INR 3.19 H/H slightly low Diet ordered  Goal of Therapy:  INR 2-3 Monitor platelets by anticoagulation protocol: Yes   Plan:   No Warfarin tonight  Daily Protime/INR  Monitor CBC, sign/sx bleed  Arley Phenix RPh 03/05/2016, 1:36 PM Pager [phone]

## 2016-03-05 NOTE — Progress Notes (Signed)
Follow-up note  X-ray of chest interpreted by radiology as having opacity at the medial left lung base consistent with infiltrate. New finding compared to prior study. Will treat with empiric IV antibiotic coverage with ceftriaxone and azithromycin for presumed community acquired pneumonia.

## 2016-03-05 NOTE — Progress Notes (Signed)
PROGRESS NOTE    [name]  WJX:[id number] DOB: 03/09/1946 DOA: 03/04/2016 PCP: [name], MD   Brief Narrative: [name] is a 70 year old gentleman with multiple comorbidities including diastolic congestive heart failure, transthoracic echocardiogram performed on 02/07/2015 that revealed an EF of 55-60% with grade 3 diastolic dysfunction, history of atrial fibrillation on chronic anticoagulation with warfarin, strip hypertension, admitted to the medicine service on 03/04/2016 when he presented with complaints of generalized weakness. [name] reporting that he has had a progressive decline over the past 6 months having increasing generalized weakness, difficulties performing activities of daily living. He states that 3 months ago began using a walker up until about a week ago when he had a fall at home. Since his fall he has been unable to ambulate.   Assessment & Plan:   Principal Problem:   FTT (failure to thrive) in adult Active Problems:   Physical deconditioning   Essential hypertension   Chronic atrial fibrillation (HCC)   Anemia of chronic disease   Recurrent falls   Chronic anticoagulation   Diastolic dysfunction-grade 13 January 2015   Weakness of both lower extremities   1.  Failure to thrive/functional decline. -[name] reporting a progressive functional decline over the past 6 months, stating that 3 months ago he began using a walker to get around his house then 1 week ago had a fall after which she has been unable to ambulate. -Will assess for a possible underlying infectious process contributing to his decline in function, obtain a urinalysis and chest x-ray. -As been hypotensive with systolic blood pressures in 80s to 90s over the course of the day as I wonder about the possibility of hypotension contributing to symptoms. Will discontinue his antihypertensive agents along with diuretic therapy. -Will also assess for possible CNS cause of symptoms with MRI of  brain.  -We will also check a TSH, B-12 and folate level, particularly with macrocytosis present on CBC. -Physical therapy consulted  2.  Chronic diastolic congestive heart failure -Last transthoracic echocardiogram was performed on 01/30/2015 that revealed preserved ejection fraction 55-60% with grade 3 diastolic dysfunction. -Plan to hold diuretic therapy (had been on Lasix 40 mg by mouth twice a day at home) given hypotension with systolic blood pressures in the 80s to 90s.  -He does not appear volume overloaded on exam.  3.  Hypotension. -He has a history of hypertension and had been on Benicar 40 mg by mouth daily, Carvedilol 6.25 mg by mouth twice a day, and Lasix 40 mg by mouth twice a day. -I suspect volume depletion may be playing a role into his hypotension. Will provide a 500 mL bolus of normal saline  4.  History of atrial fibrillation -CHADSVasc score of 4 -Remains rate controlled, has mentioned above holding beta blocker therapy due to hypotension. -Pharmacy consulted for management of warfarin. A.m. lab work showing INR 3.19 with PTT of 32.  5.  Hyperkalemia. -Lab work showing potassium of 5.9. -He had been on potassium replacement therapy at home. -Will provide ample D50 with 10 units of insulin -Repeat labs in morning.  6.  Macrocytosis. -Lab showing MCV of 106. Will check a folate and B-12 level.   DVT prophylaxis: Patient is fully anticoagulated with warfarin Code Status: Full code Family Communication:  Disposition Plan: Plan to further workup [name] weakness with MRI of brain as well as further lab work. I think he will require skilled nursing facility placement. Physical therapy has been consulted  Subjective: No  acute distress, reports ongoing weakness, unable to ambulate  Objective: Filed Vitals:   03/05/16 0537 03/05/16 1042 03/05/16 1100 03/05/16 1329  BP: 94/45 85/43 94/48  99/55  Pulse: 59 121  66  Temp: 97.8 F (36.6 C) 97.6 F (36.4 C)   97.5 F (36.4 C)  TempSrc: Oral Oral  Oral  Resp: 16 15  15   SpO2: 95% 99%  98%    Intake/Output Summary (Last 24 hours) at 03/05/16 1428 Last data filed at 03/05/16 1330  Gross per 24 hour  Intake    120 ml  Output    225 ml  Net   -105 ml   There were no vitals filed for this visit.  Examination:  General exam: Mildly obese gentleman in no acute distress Respiratory system: Clear to auscultation. Respiratory effort normal. Cardiovascular system: S1 & S2 heard, RRR. No JVD, murmurs, rubs, gallops or clicks. No pedal edema. Gastrointestinal system: Abdomen is nondistended, soft and nontender. No organomegaly or masses felt. Normal bowel sounds heard. Central nervous system: He has 3-5 muscle strength to bilateral lower extremities Extremities: Symmetric 5 x 5 power. Skin: There are chronic venous stasis changes noted to bilateral lower extremities Psychiatry: Judgement and insight appear normal. Mood & affect appropriate.    Data Reviewed: I have personally reviewed following labs and imaging studies  CBC:  Recent Labs Lab 03/04/16 1635 03/05/16 0415  WBC 6.3 6.5  NEUTROABS 5.0  --   HGB 12.2* 11.6*  HCT 37.2* 36.4*  MCV 102.2* 106.7*  PLT 153 145*   Basic Metabolic Panel:  Recent Labs Lab 03/04/16 1635 03/05/16 0415  NA 143 143  K 5.3* 5.9*  CL 113* 115*  CO2 22 23  GLUCOSE 89 117*  BUN 36* 36*  CREATININE 1.12 1.10  CALCIUM 9.0 8.7*   GFR: CrCl cannot be calculated (Unknown ideal weight.). Liver Function Tests: No results for input(s): AST, ALT, ALKPHOS, BILITOT, PROT, ALBUMIN in the last 168 hours. No results for input(s): LIPASE, AMYLASE in the last 168 hours. No results for input(s): AMMONIA in the last 168 hours. Coagulation Profile:  Recent Labs Lab 03/04/16 1635 03/05/16 0415  INR 3.09* 3.19*   Cardiac Enzymes: No results for input(s): CKTOTAL, CKMB, CKMBINDEX, TROPONINI in the last 168 hours. BNP (last 3 results) No results for  input(s): PROBNP in the last 8760 hours. HbA1C: No results for input(s): HGBA1C in the last 72 hours. CBG: No results for input(s): GLUCAP in the last 168 hours. Lipid Profile: No results for input(s): CHOL, HDL, LDLCALC, TRIG, CHOLHDL, LDLDIRECT in the last 72 hours. Thyroid Function Tests: No results for input(s): TSH, T4TOTAL, FREET4, T3FREE, THYROIDAB in the last 72 hours. Anemia Panel: No results for input(s): VITAMINB12, FOLATE, FERRITIN, TIBC, IRON, RETICCTPCT in the last 72 hours. Sepsis Labs: No results for input(s): PROCALCITON, LATICACIDVEN in the last 168 hours.  Recent Results (from the past 240 hour(s))  MRSA PCR Screening     Status: Abnormal   Collection Time: 03/05/16  1:56 AM  Result Value Ref Range Status   MRSA by PCR POSITIVE (A) NEGATIVE Final    Comment:        The GeneXpert MRSA Assay (FDA approved for NASAL specimens only), is one component of a comprehensive MRSA colonization surveillance program. It is not intended to diagnose MRSA infection nor to guide or monitor treatment for MRSA infections. RESULT CALLED TO, READ BACK BY AND VERIFIED WITH: Lynelle Doctor RN @ [phone] ON 03/05/16 BY C DAVIS  Radiology Studies: Dg Eye Foreign Body  03/04/2016  CLINICAL DATA:  Metal working/exposure; clearance prior to MRI EXAM: ORBITS FOR FOREIGN BODY - 2 VIEW COMPARISON:  None. FINDINGS: There is no evidence of metallic foreign body within the orbits. No significant bone abnormality identified. IMPRESSION: No evidence of metallic foreign body within the orbits. Electronically Signed   By: Elberta Fortis M.D.   On: 03/04/2016 22:30   Dg Lumbar Spine Complete  03/04/2016  CLINICAL DATA:  While sitting on the bench 3 days ago, the bench broke and the patient fell. Low back pain. EXAM: LUMBAR SPINE - COMPLETE 4+ VIEW COMPARISON:  None. FINDINGS: There is thoracolumbar curvature convex to the left and lower lumbar curvature convex to the right. There is degenerative  disc disease and some facet degeneration throughout the region. Lateral views are not of any use due to underpenetration. There is no high grade compression fracture or burst fracture. Cannot rule out minor endplate fractures in the region. According to the technologist's note, the patient was not able to be positioned because of pain. If there is clinical concern, CT would be suggested. IMPRESSION: Thoracolumbar curvature. Chronic degenerative disc disease and degenerative facet disease. No high grade compression fracture. Cannot rule out minor endplate fracture. Lateral views are nondiagnostic. Electronically Signed   By: Paulina Fusi M.D.   On: 03/04/2016 19:51   Dg Ankle Complete Right  03/04/2016  CLINICAL DATA:  sat on bench and it broke three days ago. Pt complains of right leg pain in his hip, knee, and ankle. Pt was not specific. Pt did not complain of low back pain. Pt was not able to lay on his side for lumbar lateral films so x-tables were attempted. Multiple techniques were tried and tube was maxed out and still unable to penetrate due to pt body habitus. EXAM: RIGHT ANKLE - COMPLETE 3+ VIEW COMPARISON:  04/11/2013 FINDINGS: There is no evidence of fracture, dislocation, or joint effusion. Mild progressive degenerative changes in the midfoot. Large calcaneal spurs as before. There is no evidence of arthropathy or other focal bone abnormality. Soft tissues are unremarkable. IMPRESSION: 1. Negative for fracture or other acute bone abnormality. Electronically Signed   By: Corlis Leak M.D.   On: 03/04/2016 17:38   Dg Chest Port 1 View  03/04/2016  CLINICAL DATA:  Cough EXAM: PORTABLE CHEST 1 VIEW COMPARISON:  10/20/2015 FINDINGS: Chronic cardiopericardial enlargement. Stable aortic and hilar contours. There is increased diffuse interstitial opacity. No convincing pleural fluid. No pneumothorax. Marked osteopenia. IMPRESSION: Acute interstitial opacities, favored congestive over bronchitic.  Electronically Signed   By: Marnee Spring M.D.   On: 03/04/2016 18:46   Dg Knee Complete 4 Views Right  03/04/2016  CLINICAL DATA:  Sat on bench and it broke three days ago. Pt complains of right leg pain in his hip, knee, and ankle. Pt was not specific. Pt did not complain of low back pain. Pt was not able to lay on his side for lumbar lateral films so x-tables were attempted. Multiple techniques were tried and tube was maxed out and still unable to penetrate due to pt body habitus. EXAM: RIGHT KNEE - COMPLETE 4+ VIEW COMPARISON:  None. FINDINGS: No evidence of fracture, dislocation. Effusion in the suprapatellar bursa. Small marginal spurs from the lateral femoral condyles and medial tibial plateau. No evidence of arthropathy or other focal bone abnormality. Soft tissues are unremarkable. IMPRESSION: 1. Small effusion with no evidence of fracture or dislocation. 2. Early degenerative spurring in  medial and lateral compartments. Electronically Signed   By: Corlis Leak M.D.   On: 03/04/2016 17:36   Dg Hip Unilat With Pelvis 2-3 Views Right  03/04/2016  CLINICAL DATA:  sat on bench and it broke three days ago. Pt complains of right leg pain in his hip, knee, and ankle. Pt was not specific. Pt did not complain of low back pain. Pt was not able to lay on his side for lumbar lateral films so x-tables were attempted. Multiple techniques were tried and tube was maxed out and still unable to penetrate due to pt body habitus. EXAM: DG HIP (WITH OR WITHOUT PELVIS) 2-3V RIGHT COMPARISON:  CT 04/22/2013 FINDINGS: Mild narrowing of the arch scratch the moderate narrowing of the articular cartilage on the right, mild narrowing on the left. Small marginal spurs from the acetabulum on the right. Negative for fracture or dislocation. Degenerative disc disease in the visualized lower lumbar spine. Bilateral pelvic vascular calcifications. IMPRESSION: 1. Negative for fracture, dislocation, or other acute bone abnormality. 2.  Bilateral hip DJD, right greater than left. 3. Lower lumbar degenerative disc disease. Electronically Signed   By: Corlis Leak M.D.   On: 03/04/2016 17:34        Scheduled Meds: . allopurinol  100 mg Oral Daily  . antiseptic oral rinse  7 mL Mouth Rinse q12n4p  . chlorhexidine  15 mL Mouth Rinse BID  . Chlorhexidine Gluconate Cloth  6 each Topical Q0600  . dextrose  1 ampule Intravenous Once  . feeding supplement (ENSURE ENLIVE)  237 mL Oral BID BM  . ferrous sulfate  325 mg Oral Daily  . insulin aspart  10 Units Intravenous Once  . mupirocin ointment  1 application Nasal BID  . pravastatin  10 mg Oral q1800  . sodium chloride flush  3 mL Intravenous Q12H   Continuous Infusions:       Time spent: 35 min    Jeralyn Bennett, MD [facility] Pager [phone]  If 7PM-7AM, please contact night-coverage [email or web address] Password [email or web address] 03/05/2016, 2:28 PM

## 2016-03-05 NOTE — Progress Notes (Signed)
Patient arrived back to floor from [facility] at 2030

## 2016-03-05 NOTE — Progress Notes (Signed)
Patient scheduled for MRI at [facility] at 1600, [facility] notified for transport, patient verbalized agreement and understanding

## 2016-03-06 ENCOUNTER — Observation Stay (HOSPITAL_COMMUNITY): Payer: Medicare HMO

## 2016-03-06 DIAGNOSIS — E875 Hyperkalemia: Secondary | ICD-10-CM | POA: Diagnosis present

## 2016-03-06 DIAGNOSIS — R001 Bradycardia, unspecified: Secondary | ICD-10-CM | POA: Diagnosis present

## 2016-03-06 DIAGNOSIS — I110 Hypertensive heart disease with heart failure: Secondary | ICD-10-CM | POA: Diagnosis present

## 2016-03-06 DIAGNOSIS — L0390 Cellulitis, unspecified: Secondary | ICD-10-CM | POA: Diagnosis present

## 2016-03-06 DIAGNOSIS — D7589 Other specified diseases of blood and blood-forming organs: Secondary | ICD-10-CM | POA: Diagnosis present

## 2016-03-06 DIAGNOSIS — I429 Cardiomyopathy, unspecified: Secondary | ICD-10-CM | POA: Diagnosis present

## 2016-03-06 DIAGNOSIS — E872 Acidosis: Secondary | ICD-10-CM | POA: Diagnosis present

## 2016-03-06 DIAGNOSIS — Z66 Do not resuscitate: Secondary | ICD-10-CM | POA: Diagnosis present

## 2016-03-06 DIAGNOSIS — E662 Morbid (severe) obesity with alveolar hypoventilation: Secondary | ICD-10-CM | POA: Diagnosis present

## 2016-03-06 DIAGNOSIS — J9602 Acute respiratory failure with hypercapnia: Secondary | ICD-10-CM | POA: Diagnosis not present

## 2016-03-06 DIAGNOSIS — I2781 Cor pulmonale (chronic): Secondary | ICD-10-CM | POA: Diagnosis present

## 2016-03-06 DIAGNOSIS — R579 Shock, unspecified: Secondary | ICD-10-CM | POA: Diagnosis not present

## 2016-03-06 DIAGNOSIS — Z87891 Personal history of nicotine dependence: Secondary | ICD-10-CM | POA: Diagnosis not present

## 2016-03-06 DIAGNOSIS — I5043 Acute on chronic combined systolic (congestive) and diastolic (congestive) heart failure: Secondary | ICD-10-CM | POA: Diagnosis present

## 2016-03-06 DIAGNOSIS — J189 Pneumonia, unspecified organism: Secondary | ICD-10-CM | POA: Diagnosis present

## 2016-03-06 DIAGNOSIS — D638 Anemia in other chronic diseases classified elsewhere: Secondary | ICD-10-CM | POA: Diagnosis present

## 2016-03-06 DIAGNOSIS — Z9181 History of falling: Secondary | ICD-10-CM | POA: Diagnosis not present

## 2016-03-06 DIAGNOSIS — M1000 Idiopathic gout, unspecified site: Secondary | ICD-10-CM | POA: Diagnosis not present

## 2016-03-06 DIAGNOSIS — Z79899 Other long term (current) drug therapy: Secondary | ICD-10-CM | POA: Diagnosis not present

## 2016-03-06 DIAGNOSIS — I482 Chronic atrial fibrillation: Secondary | ICD-10-CM | POA: Diagnosis not present

## 2016-03-06 DIAGNOSIS — G9341 Metabolic encephalopathy: Secondary | ICD-10-CM | POA: Diagnosis present

## 2016-03-06 DIAGNOSIS — M10042 Idiopathic gout, left hand: Secondary | ICD-10-CM | POA: Diagnosis not present

## 2016-03-06 DIAGNOSIS — R29898 Other symptoms and signs involving the musculoskeletal system: Secondary | ICD-10-CM | POA: Diagnosis present

## 2016-03-06 DIAGNOSIS — J9601 Acute respiratory failure with hypoxia: Secondary | ICD-10-CM | POA: Diagnosis not present

## 2016-03-06 DIAGNOSIS — D696 Thrombocytopenia, unspecified: Secondary | ICD-10-CM | POA: Diagnosis present

## 2016-03-06 DIAGNOSIS — J9690 Respiratory failure, unspecified, unspecified whether with hypoxia or hypercapnia: Secondary | ICD-10-CM

## 2016-03-06 DIAGNOSIS — Y95 Nosocomial condition: Secondary | ICD-10-CM | POA: Diagnosis present

## 2016-03-06 DIAGNOSIS — R627 Adult failure to thrive: Secondary | ICD-10-CM

## 2016-03-06 DIAGNOSIS — I872 Venous insufficiency (chronic) (peripheral): Secondary | ICD-10-CM | POA: Diagnosis present

## 2016-03-06 DIAGNOSIS — R791 Abnormal coagulation profile: Secondary | ICD-10-CM | POA: Diagnosis present

## 2016-03-06 DIAGNOSIS — M10049 Idiopathic gout, unspecified hand: Secondary | ICD-10-CM | POA: Diagnosis not present

## 2016-03-06 DIAGNOSIS — R5381 Other malaise: Secondary | ICD-10-CM | POA: Diagnosis not present

## 2016-03-06 DIAGNOSIS — Z8249 Family history of ischemic heart disease and other diseases of the circulatory system: Secondary | ICD-10-CM | POA: Diagnosis not present

## 2016-03-06 DIAGNOSIS — J9811 Atelectasis: Secondary | ICD-10-CM | POA: Diagnosis present

## 2016-03-06 DIAGNOSIS — D539 Nutritional anemia, unspecified: Secondary | ICD-10-CM | POA: Diagnosis present

## 2016-03-06 DIAGNOSIS — Z7901 Long term (current) use of anticoagulants: Secondary | ICD-10-CM | POA: Diagnosis not present

## 2016-03-06 DIAGNOSIS — T45515A Adverse effect of anticoagulants, initial encounter: Secondary | ICD-10-CM | POA: Diagnosis present

## 2016-03-06 DIAGNOSIS — M109 Gout, unspecified: Secondary | ICD-10-CM | POA: Diagnosis present

## 2016-03-06 DIAGNOSIS — J9622 Acute and chronic respiratory failure with hypercapnia: Secondary | ICD-10-CM | POA: Diagnosis not present

## 2016-03-06 DIAGNOSIS — Z825 Family history of asthma and other chronic lower respiratory diseases: Secondary | ICD-10-CM | POA: Diagnosis not present

## 2016-03-06 DIAGNOSIS — F10231 Alcohol dependence with withdrawal delirium: Secondary | ICD-10-CM | POA: Diagnosis present

## 2016-03-06 DIAGNOSIS — Z6841 Body Mass Index (BMI) 40.0 and over, adult: Secondary | ICD-10-CM | POA: Diagnosis not present

## 2016-03-06 DIAGNOSIS — K219 Gastro-esophageal reflux disease without esophagitis: Secondary | ICD-10-CM | POA: Diagnosis present

## 2016-03-06 DIAGNOSIS — J9621 Acute and chronic respiratory failure with hypoxia: Secondary | ICD-10-CM | POA: Diagnosis present

## 2016-03-06 DIAGNOSIS — R609 Edema, unspecified: Secondary | ICD-10-CM | POA: Diagnosis not present

## 2016-03-06 DIAGNOSIS — I519 Heart disease, unspecified: Secondary | ICD-10-CM | POA: Diagnosis not present

## 2016-03-06 DIAGNOSIS — I2609 Other pulmonary embolism with acute cor pulmonale: Secondary | ICD-10-CM | POA: Diagnosis not present

## 2016-03-06 LAB — BASIC METABOLIC PANEL
Anion gap: 3 — ABNORMAL LOW (ref 5–15)
BUN: 39 mg/dL — AB (ref 6–20)
CO2: 25 mmol/L (ref 22–32)
CREATININE: 1.21 mg/dL (ref 0.61–1.24)
Calcium: 8.6 mg/dL — ABNORMAL LOW (ref 8.9–10.3)
Chloride: 114 mmol/L — ABNORMAL HIGH (ref 101–111)
GFR calc Af Amer: >60 mL/min (ref >60)
GFR, EST NON AFRICAN AMERICAN: 60 mL/min — AB (ref >60)
Glucose, Bld: 129 mg/dL — ABNORMAL HIGH (ref 65–99)
POTASSIUM: 5 mmol/L (ref 3.5–5.1)
SODIUM: 142 mmol/L (ref 135–145)

## 2016-03-06 LAB — BLOOD GAS, ARTERIAL
ACID-BASE DEFICIT: 3 mmol/L — AB (ref 0.0–2.0)
Acid-base deficit: 2.6 mmol/L — ABNORMAL HIGH (ref 0.0–2.0)
Bicarbonate: 24.6 mEq/L — ABNORMAL HIGH (ref 20.0–24.0)
Bicarbonate: 23.8 mEq/L (ref 20.0–24.0)
DELIVERY SYSTEMS: POSITIVE
Drawn by: 40662
Drawn by: 406621
FIO2: 0.4
LHR: 10 {breaths}/min
O2 Content: 3 L/min
O2 SAT: 96 %
O2 SAT: 99 %
PCO2 ART: 57.8 mmHg — AB (ref 35.0–45.0)
PEEP: 5 cmH2O
PH ART: 7.268 — AB (ref 7.350–7.450)
PO2 ART: 86.7 mmHg (ref 80.0–100.0)
Patient temperature: 98.6
Patient temperature: 98.6
Pressure control: 10 cmH2O
TCO2: 23.4 mmol/L (ref 0–100)
TCO2: 22.5 mmol/L (ref 0–100)
pCO2 arterial: 53.7 mmHg — ABNORMAL HIGH (ref 35.0–45.0)
pH, Arterial: 7.253 — ABNORMAL LOW (ref 7.350–7.450)
pO2, Arterial: 138 mmHg — ABNORMAL HIGH (ref 80.0–100.0)

## 2016-03-06 LAB — CORTISOL: Cortisol, Plasma: 24.8 ug/dL

## 2016-03-06 LAB — LACTIC ACID, PLASMA: Lactic Acid, Venous: 1 mmol/L (ref 0.5–2.0)

## 2016-03-06 LAB — CBC
HEMATOCRIT: 34.1 % — AB (ref 39.0–52.0)
Hemoglobin: 11.1 g/dL — ABNORMAL LOW (ref 13.0–17.0)
MCH: 34.7 pg — ABNORMAL HIGH (ref 26.0–34.0)
MCHC: 32.6 g/dL (ref 30.0–36.0)
MCV: 106.6 fL — ABNORMAL HIGH (ref 78.0–100.0)
PLATELETS: 135 10*3/uL — AB (ref 150–400)
RBC: 3.2 MIL/uL — ABNORMAL LOW (ref 4.22–5.81)
RDW: 16.3 % — AB (ref 11.5–15.5)
WBC: 7.4 10*3/uL (ref 4.0–10.5)

## 2016-03-06 LAB — PROTIME-INR
INR: 3.77 — AB (ref 0.00–1.49)
Prothrombin Time: 36.4 seconds — ABNORMAL HIGH (ref 11.6–15.2)

## 2016-03-06 LAB — STREP PNEUMONIAE URINARY ANTIGEN: Strep Pneumo Urinary Antigen: NEGATIVE

## 2016-03-06 LAB — FOLATE: FOLATE: 7.7 ng/mL (ref >5.9)

## 2016-03-06 LAB — VITAMIN B12: VITAMIN B 12: 883 pg/mL (ref 180–914)

## 2016-03-06 LAB — TSH: TSH: 0.968 u[IU]/mL (ref 0.350–4.500)

## 2016-03-06 MED ORDER — SODIUM CHLORIDE 0.9 % IV BOLUS (SEPSIS)
500.0000 mL | Freq: Once | INTRAVENOUS | Status: AC
  Administered 2016-03-06: 500 mL via INTRAVENOUS

## 2016-03-06 MED ORDER — SODIUM CHLORIDE 0.9 % IV SOLN
INTRAVENOUS | Status: DC
  Administered 2016-03-06 – 2016-03-11 (×3): via INTRAVENOUS

## 2016-03-06 MED ORDER — PHENYLEPHRINE HCL 10 MG/ML IJ SOLN
30.0000 ug/min | INTRAVENOUS | Status: DC
  Administered 2016-03-06 – 2016-03-07 (×2): 55 ug/min via INTRAVENOUS
  Administered 2016-03-08: 50 ug/min via INTRAVENOUS
  Administered 2016-03-08: 75 ug/min via INTRAVENOUS
  Administered 2016-03-08: 70 ug/min via INTRAVENOUS
  Filled 2016-03-06 (×5): qty 4

## 2016-03-06 MED ORDER — SODIUM CHLORIDE 0.9 % IV BOLUS (SEPSIS)
500.0000 mL | Freq: Once | INTRAVENOUS | Status: DC
  Administered 2016-03-06: 500 mL via INTRAVENOUS

## 2016-03-06 MED ORDER — PHENYLEPHRINE HCL 10 MG/ML IJ SOLN
30.0000 ug/min | INTRAVENOUS | Status: DC
  Administered 2016-03-06: 55 ug/min via INTRAVENOUS
  Administered 2016-03-06: 30 ug/min via INTRAVENOUS
  Filled 2016-03-06 (×3): qty 1

## 2016-03-06 NOTE — Progress Notes (Signed)
ANTICOAGULATION CONSULT NOTE -   Pharmacy Consult for Warfarin Indication: atrial fibrillation  No Known Allergies  Patient Measurements:   TBW: weight in January 150kg  Vital Signs: Temp: 98.7 F (37.1 C) (05/26 0600) Temp Source: Oral (05/26 0600) BP: 98/52 mmHg (05/26 0600) Pulse Rate: 66 (05/26 0600)  Labs:  Recent Labs  03/04/16 1635 03/05/16 0415 03/06/16 0413  HGB 12.2* 11.6* 11.1*  HCT 37.2* 36.4* 34.1*  PLT 153 145* 135*  LABPROT 31.3* 32.0* 36.4*  INR 3.09* 3.19* 3.77*  CREATININE 1.12 1.10 1.21   CrCl cannot be calculated (Unknown ideal weight.).  Medical History: Past Medical History  Diagnosis Date  . Cardiomyopathy     nonischemic  . CHF (congestive heart failure) (HCC)     ejection fractio of 45% per echo in 2011 (previos EF 35-40% in 2003), echo 04/2013  -LVEF 50-55%.  . Alcoholism (HCC)   . Atrial fibrillation or flutter   . Hypertension   . Obesity   . Ventricular tachycardia (HCC)     nonsustained, asymptomatic  . Anemia   . Hypoxemia   . GERD (gastroesophageal reflux disease)     takes tums prn  . Complication of anesthesia     April 11, 2013   Medications:  Scheduled:   Assessment: 14 yoM to ED 5/24 with bilateral LE weakness and swelling, RLE pain s/p fall at home 3 days ago. No fx per Xray. Chronic Warfarin for Afib: Home dose 2.5mg  MWF, 5mg  other days,LD 5/23 (2000)  Admit INR 3.09  Home dose lower than expected for large body habitus  03/06/2016 INR 3.77 H/H slightly low PLTS decreasing Diet ordered  Goal of Therapy:  INR 2-3 Monitor platelets by anticoagulation protocol: Yes   Plan:   No Warfarin tonight  Daily Protime/INR  Monitor CBC, sign/sx bleed  Arley Phenix RPh 03/06/2016, 9:38 AM Pager [phone]

## 2016-03-06 NOTE — Progress Notes (Signed)
  [name] [id number] Transfer Data: 03/06/2016 2:15 PM Attending Provider: Jeralyn Bennett, MD VHQ:IONGE, TAMMY, MD Code Status: FULL  Pt appearing lethargic but responds to voice and stimulation. BP 85/42, pulse 66, RR 10. Dr. Vanessa Barbara notified. Orders received and implemented, see MAR.

## 2016-03-06 NOTE — Progress Notes (Signed)
Changed phenylephrine infusion to quadruple strength and reminded [place] RN via phone to reprogram the pump.   Charolotte Eke, PharmD, pager [phone]. 03/06/2016,10:04 PM.

## 2016-03-06 NOTE — Progress Notes (Signed)
PROGRESS NOTE    [name]  ZOX:[id number] DOB: 1945-12-24 DOA: 03/04/2016 PCP: [name], MD   Brief Narrative: [name] is a 70 year old gentleman with multiple comorbidities including diastolic congestive heart failure, transthoracic echocardiogram performed on 02/07/2015 that revealed an EF of 55-60% with grade 3 diastolic dysfunction, history of atrial fibrillation on chronic anticoagulation with warfarin, strip hypertension, admitted to the medicine service on 03/04/2016 when he presented with complaints of generalized weakness. [name] reporting that he has had a progressive decline over the past 6 months having increasing generalized weakness, difficulties performing activities of daily living. He states that 3 months ago began using a walker up until about a week ago when he had a fall at home. Since his fall he has been unable to ambulate.   Assessment & Plan:   Principal Problem:   FTT (failure to thrive) in adult Active Problems:   Physical deconditioning   Essential hypertension   Chronic atrial fibrillation (HCC)   Anemia of chronic disease   Recurrent falls   Chronic anticoagulation   Diastolic dysfunction-grade 13 January 2015   Weakness of both lower extremities   1.  Community-acquired pneumonia -[name] presented with a steep functional decline, although it appears that he has had a progressive decline in the past 6 months. -Workup revealing the presence of a medial left lung base opacity consistent with pneumonia. -He was started on empiric IV antibiotic therapy with ceftriaxone and azithromycin -Will place him on pneumonia protocol, obtain blood cultures and sputum cultures -Plan to repeat chest x-ray.  2.  Acute hypercarbic respiratory failure. -ABG showed a pH of 7.25, PCO2 of 57, having mental status changes. -ABG consistent with primary acute respiratory acidosis, uncompensated. -He will be transferred to the step down unit and placed on  BiPAP -I suspect he has Obesity Hypoventilation Syndrome/OSA -Plan to repeat ABG in 2 hours. Will also check a repeat CXR.   3.  Failure to thrive/functional decline. -[name] reporting a progressive functional decline over the past 6 months, stating that 3 months ago he began using a walker to get around his house then 1 week ago had a fall after which she has been unable to ambulate. -A chest x-ray performed on 03/05/2016 interpreted by radiology as having opacity at the medial left lung base consistent with an infiltrate, new compared to prior exam. -He was started on empiric IV antibiotic therapy with ceftriaxone and azithromycin on 03/05/2016. -Remains weak and severely deconditioned  4.  Chronic diastolic congestive heart failure -Last transthoracic echocardiogram was performed on 01/30/2015 that revealed preserved ejection fraction 55-60% with grade 3 diastolic dysfunction. -Plan to hold diuretic therapy (had been on Lasix 40 mg by mouth twice a day at home) given hypotension with systolic blood pressures in the 80s to 90s.  -He does not appear volume overloaded on exam.  5.  Hypotension. -He has a history of hypertension and had been on Benicar 40 mg by mouth daily, Carvedilol 6.25 mg by mouth twice a day, and Lasix 40 mg by mouth twice a day. -He was given 500 mL bolus of normal saline today.  6.  History of atrial fibrillation -CHADSVasc score of 4 -Remains rate controlled, has mentioned above holding beta blocker therapy due to hypotension. -INR trending up to 3.77 likely resulting from the administration of antibiotics  7.  Hyperkalemia. -Lab work showing potassium of 5.9. -He had been on potassium replacement therapy at home. -He was given D50 with 10 units of  insulin on 03/05/2016 -Repeat labs showing potassium of 5.0    DVT prophylaxis: Patient is fully anticoagulated with warfarin Code Status: Full code Family Communication:  Disposition Plan: Plan to transfer to  stepdown unit given acute hypercarbic respiratory failure  Subjective: He appears lethargic however is arousable and can follow commands and answer questions appropriately.  Objective: Filed Vitals:   03/06/16 0454 03/06/16 0600 03/06/16 1000 03/06/16 1222  BP: 100/55 98/52 101/44 86/43  Pulse: 64 66 67   Temp: 97.7 F (36.5 C) 98.7 F (37.1 C) 99.2 F (37.3 C)   TempSrc: Oral Oral Axillary   Resp: SpO2: 98% 98% 98% 100%    Intake/Output Summary (Last 24 hours) at 03/06/16 1258 Last data filed at 03/06/16 1001  Gross per 24 hour  Intake   1770 ml  Output    675 ml  Net   1095 ml   There were no vitals filed for this visit.  Examination:  General exam: He is lethargic on exam however arousable and can answer questions. Respiratory system: Diminished breath sounds bilaterally, I did not auscultate wheezing crackles or rhonchi. Cardiovascular system: S1 & S2 heard, RRR. No JVD, murmurs, rubs, gallops or clicks. No pedal edema. Gastrointestinal system: Abdomen is nondistended, soft and nontender. No organomegaly or masses felt. Normal bowel sounds heard. Central nervous system: He has 3-5 muscle strength to bilateral lower extremities Extremities: Symmetric 5 x 5 power. Skin: There are chronic venous stasis changes noted to bilateral lower extremities. Examined his back there is no evidence of decubitus ulcers. Psychiatry: He appears lethargic however arousable.   Data Reviewed: I have personally reviewed following labs and imaging studies  CBC:  Recent Labs Lab 03/04/16 1635 03/05/16 0415 03/06/16 0413  WBC 6.3 6.5 7.4  NEUTROABS 5.0  --   --   HGB 12.2* 11.6* 11.1*  HCT 37.2* 36.4* 34.1*  MCV 102.2* 106.7* 106.6*  PLT 153 145* 135*   Basic Metabolic Panel:  Recent Labs Lab 03/04/16 1635 03/05/16 0415 03/06/16 0413  NA 143 143 142  K 5.3* 5.9* 5.0  CL 113* 115* 114*  CO2 GLUCOSE 89 117* 129*  BUN 36* 36* 39*  CREATININE 1.12 1.10  1.21  CALCIUM 9.0 8.7* 8.6*   GFR: CrCl cannot be calculated (Unknown ideal weight.). Liver Function Tests: No results for input(s): AST, ALT, ALKPHOS, BILITOT, PROT, ALBUMIN in the last 168 hours. No results for input(s): LIPASE, AMYLASE in the last 168 hours. No results for input(s): AMMONIA in the last 168 hours. Coagulation Profile:  Recent Labs Lab 03/04/16 1635 03/05/16 0415 03/06/16 0413  INR 3.09* 3.19* 3.77*   Cardiac Enzymes: No results for input(s): CKTOTAL, CKMB, CKMBINDEX, TROPONINI in the last 168 hours. BNP (last 3 results) No results for input(s): PROBNP in the last 8760 hours. HbA1C: No results for input(s): HGBA1C in the last 72 hours. CBG:  Recent Labs Lab 03/05/16 2236  GLUCAP 132*   Lipid Profile: No results for input(s): CHOL, HDL, LDLCALC, TRIG, CHOLHDL, LDLDIRECT in the last 72 hours. Thyroid Function Tests:  Recent Labs  03/06/16 0849  TSH 0.968   Anemia Panel:  Recent Labs  03/06/16 0849  VITAMINB12 883  FOLATE 7.7   Sepsis Labs: No results for input(s): PROCALCITON, LATICACIDVEN in the last 168 hours.  Recent Results (from the past 240 hour(s))  MRSA PCR Screening     Status: Abnormal   Collection Time: 03/05/16  1:56 AM  Result  Value Ref Range Status   MRSA by PCR POSITIVE (A) NEGATIVE Final    Comment:        The GeneXpert MRSA Assay (FDA approved for NASAL specimens only), is one component of a comprehensive MRSA colonization surveillance program. It is not intended to diagnose MRSA infection nor to guide or monitor treatment for MRSA infections. RESULT CALLED TO, READ BACK BY AND VERIFIED WITH: Lynelle Doctor RN @ [phone] ON 03/05/16 BY C DAVIS          Radiology Studies: Dg Eye Foreign Body  03/04/2016  CLINICAL DATA:  Metal working/exposure; clearance prior to MRI EXAM: ORBITS FOR FOREIGN BODY - 2 VIEW COMPARISON:  None. FINDINGS: There is no evidence of metallic foreign body within the orbits. No significant bone  abnormality identified. IMPRESSION: No evidence of metallic foreign body within the orbits. Electronically Signed   By: Elberta Fortis M.D.   On: 03/04/2016 22:30   Dg Chest 1 View  03/05/2016  CLINICAL DATA:  Failure to thrive, weakness, fatigue. Hx of CHF, AFIB, hypertension. EXAM: CHEST 1 VIEW COMPARISON:  02/23/2016 FINDINGS: Marked enlargement of the cardiac silhouette. There is opacity at the medial left lung base, consistent with infiltrate, new since prior. No overt edema. No pleural effusions identified. IMPRESSION: Stable, moderate to marked cardiomegaly. Next item interval development of left lower lobe infiltrate. Electronically Signed   By: Norva Pavlov M.D.   On: 03/05/2016 15:33   Dg Lumbar Spine Complete  03/04/2016  CLINICAL DATA:  While sitting on the bench 3 days ago, the bench broke and the patient fell. Low back pain. EXAM: LUMBAR SPINE - COMPLETE 4+ VIEW COMPARISON:  None. FINDINGS: There is thoracolumbar curvature convex to the left and lower lumbar curvature convex to the right. There is degenerative disc disease and some facet degeneration throughout the region. Lateral views are not of any use due to underpenetration. There is no high grade compression fracture or burst fracture. Cannot rule out minor endplate fractures in the region. According to the technologist's note, the patient was not able to be positioned because of pain. If there is clinical concern, CT would be suggested. IMPRESSION: Thoracolumbar curvature. Chronic degenerative disc disease and degenerative facet disease. No high grade compression fracture. Cannot rule out minor endplate fracture. Lateral views are nondiagnostic. Electronically Signed   By: Paulina Fusi M.D.   On: 03/04/2016 19:51   Dg Ankle Complete Right  03/04/2016  CLINICAL DATA:  sat on bench and it broke three days ago. Pt complains of right leg pain in his hip, knee, and ankle. Pt was not specific. Pt did not complain of low back pain. Pt was  not able to lay on his side for lumbar lateral films so x-tables were attempted. Multiple techniques were tried and tube was maxed out and still unable to penetrate due to pt body habitus. EXAM: RIGHT ANKLE - COMPLETE 3+ VIEW COMPARISON:  04/11/2013 FINDINGS: There is no evidence of fracture, dislocation, or joint effusion. Mild progressive degenerative changes in the midfoot. Large calcaneal spurs as before. There is no evidence of arthropathy or other focal bone abnormality. Soft tissues are unremarkable. IMPRESSION: 1. Negative for fracture or other acute bone abnormality. Electronically Signed   By: Corlis Leak M.D.   On: 03/04/2016 17:38   Richard Brain Wo Contrast  03/05/2016  CLINICAL DATA:  Lower extremity weakness. Failure thrive. Unable to ambulate. EXAM: MRI HEAD WITHOUT CONTRAST TECHNIQUE: Multiplanar, multiecho pulse sequences of the brain and surrounding structures  were obtained without intravenous contrast. COMPARISON:  None. FINDINGS: Examination is mildly motion degraded. There are also regions of decreased signal on some sequences due to inability to use the standard head coil. There is no evidence of acute infarct, intracranial hemorrhage, intra-axial mass, midline shift, or extra-axial fluid collection. Ventricles and sulci are within normal limits for age. Patchy T2 hyperintensities throughout the subcortical and deep cerebral white matter bilaterally are nonspecific but compatible with mild-to-moderate chronic small vessel ischemic disease. 10 x 5 mm extra-axial focus of susceptibility artifact adjacent to the anterolateral right temporal lobe may represent a small exostosis or incidental calcified meningioma. Prior right cataract extraction is noted. Paranasal sinuses are clear. There is a trace left mastoid effusion. Major intracranial vascular flow voids are preserved. IMPRESSION: 1. No acute intracranial abnormality. 2. Mild-to-moderate chronic small vessel ischemic disease. Electronically  Signed   By: Sebastian Ache M.D.   On: 03/05/2016 19:43   Richard Lumbar Spine Wo Contrast  03/05/2016  CLINICAL DATA:  Bilateral lower extremity weakness. Failure to thrive. Unable to ambulate for the past 3 weeks. EXAM: MRI LUMBAR SPINE WITHOUT CONTRAST TECHNIQUE: Multiplanar, multisequence Richard imaging of the lumbar spine was performed. No intravenous contrast was administered. COMPARISON:  Lumbar spine radiographs 03/04/2016 FINDINGS: The study is largely nondiagnostic due to patient body habitus and resultant poor signal. Sagittal and coronal STIR images are completely nondiagnostic. The anterior lumbar spine is obscured on sagittal T1 images. Axial images were not obtained as the patient could not tolerate additional imaging. There is mild upper lumbar levoscoliosis. There is likely trace retrolisthesis of T12 on L1, L1 on L2, and L2 on L3. Moderate to severe disc space narrowing is present at L1-2 with mild T2 hyperintensity in the disc space on the right. There is mild disc space narrowing at L2-3 with prominent T2 hyperintensity throughout the disc space. Vertebral marrow edema at these levels cannot be adequately assessed for due to nondiagnostic STIR images and artifact through these regions on the T1 series. Assessment of the spinal canal is severely limited, without evidence of critical stenosis on the sagittal T2 series. The distal spinal cord/conus is not well evaluated. IMPRESSION: Largely nondiagnostic study as above. Abnormal fluid signal in the L2-3 disc space. Early infectious discitis cannot be excluded if this is a clinical concern. Electronically Signed   By: Sebastian Ache M.D.   On: 03/05/2016 19:37   Dg Chest Port 1 View  03/04/2016  CLINICAL DATA:  Cough EXAM: PORTABLE CHEST 1 VIEW COMPARISON:  10/20/2015 FINDINGS: Chronic cardiopericardial enlargement. Stable aortic and hilar contours. There is increased diffuse interstitial opacity. No convincing pleural fluid. No pneumothorax. Marked  osteopenia. IMPRESSION: Acute interstitial opacities, favored congestive over bronchitic. Electronically Signed   By: Marnee Spring M.D.   On: 03/04/2016 18:46   Dg Knee Complete 4 Views Right  03/04/2016  CLINICAL DATA:  Sat on bench and it broke three days ago. Pt complains of right leg pain in his hip, knee, and ankle. Pt was not specific. Pt did not complain of low back pain. Pt was not able to lay on his side for lumbar lateral films so x-tables were attempted. Multiple techniques were tried and tube was maxed out and still unable to penetrate due to pt body habitus. EXAM: RIGHT KNEE - COMPLETE 4+ VIEW COMPARISON:  None. FINDINGS: No evidence of fracture, dislocation. Effusion in the suprapatellar bursa. Small marginal spurs from the lateral femoral condyles and medial tibial plateau. No evidence of arthropathy or  other focal bone abnormality. Soft tissues are unremarkable. IMPRESSION: 1. Small effusion with no evidence of fracture or dislocation. 2. Early degenerative spurring in medial and lateral compartments. Electronically Signed   By: Corlis Leak M.D.   On: 03/04/2016 17:36   Dg Hip Unilat With Pelvis 2-3 Views Right  03/04/2016  CLINICAL DATA:  sat on bench and it broke three days ago. Pt complains of right leg pain in his hip, knee, and ankle. Pt was not specific. Pt did not complain of low back pain. Pt was not able to lay on his side for lumbar lateral films so x-tables were attempted. Multiple techniques were tried and tube was maxed out and still unable to penetrate due to pt body habitus. EXAM: DG HIP (WITH OR WITHOUT PELVIS) 2-3V RIGHT COMPARISON:  CT 04/22/2013 FINDINGS: Mild narrowing of the arch scratch the moderate narrowing of the articular cartilage on the right, mild narrowing on the left. Small marginal spurs from the acetabulum on the right. Negative for fracture or dislocation. Degenerative disc disease in the visualized lower lumbar spine. Bilateral pelvic vascular  calcifications. IMPRESSION: 1. Negative for fracture, dislocation, or other acute bone abnormality. 2. Bilateral hip DJD, right greater than left. 3. Lower lumbar degenerative disc disease. Electronically Signed   By: Corlis Leak M.D.   On: 03/04/2016 17:34        Scheduled Meds: . allopurinol  100 mg Oral Daily  . antiseptic oral rinse  7 mL Mouth Rinse q12n4p  . azithromycin  500 mg Intravenous Q24H  . cefTRIAXone (ROCEPHIN)  IV  1 g Intravenous Q24H  . chlorhexidine  15 mL Mouth Rinse BID  . Chlorhexidine Gluconate Cloth  6 each Topical Q0600  . feeding supplement (ENSURE ENLIVE)  237 mL Oral BID BM  . ferrous sulfate  325 mg Oral Daily  . mupirocin ointment  1 application Nasal BID  . pravastatin  10 mg Oral q1800  . sodium chloride flush  3 mL Intravenous Q12H   Continuous Infusions:       Time spent: 40 min    Jeralyn Bennett, MD [facility] Pager [phone]  If 7PM-7AM, please contact night-coverage [email or web address] Password [facility] 03/06/2016, 12:58 PM

## 2016-03-06 NOTE — Progress Notes (Signed)
Dr Vanessa Barbara made aware that patient had critical ABG results.  The results read to Dr Vanessa Barbara.  Results include:  PH-7.25 CO2-57.8 O2-86.7 Bicarb-24.6

## 2016-03-06 NOTE — Plan of Care (Signed)
Problem: Health Behavior/Discharge Planning: Goal: Ability to manage health-related needs will improve Outcome: Not Progressing Patient moved to ICU today because of lethargy, low blood pressure, and low pH on ABG. Patient started on bipap. Patient's blood pressure remained low and MD ordered neo to be started for pressure support. Patient still hypotensive and bradycardic. Will continue to monitor.

## 2016-03-06 NOTE — Consult Note (Signed)
PULMONARY / CRITICAL CARE MEDICINE   Name: [name] MRN: [id number] DOB: [birth year]    ADMISSION DATE:  03/04/2016 CONSULTATION DATE:  5/26  REFERRING MD:  Vanessa Barbara   CHIEF COMPLAINT:  Acute hypercarbic respiratory failure and hypotension   HISTORY OF PRESENT ILLNESS:   70 year old gentleman with multiple comorbidities including gd 3 diastolic congestive heart failure, atrial fibrillation on chronic anticoagulation with warfarin, and hypertension, admitted to the medicine service on 03/04/2016 when he presented with complaints of generalized weakness. Has had a progressive decline over the past 6 months having increasing generalized weakness, difficulties performing activities of daily living. He stated that 3 months ago began using a walker up until about a week ago when he had a fall at home. Since his fall he has been unable to ambulate. Was admitted to the medical floor w/ working dx of PNA and cellulitis. On 5/26 found more lethargic and hypercarbic (did have 1 dose of dilaudid earlier that am-->since stopped). Was transferred to the ICU and PCCM asked to a/w care.  PAST MEDICAL HISTORY :  He  has a past medical history of Cardiomyopathy; CHF (congestive heart failure) (HCC); Alcoholism (HCC); Atrial fibrillation or flutter; Hypertension; Obesity; Ventricular tachycardia (HCC); Anemia; Hypoxemia; GERD (gastroesophageal reflux disease); and Complication of anesthesia.  PAST SURGICAL HISTORY: He  has past surgical history that includes Tonsillectomy; Knee bursectomy (Right, 04/20/2013); and I&D extremity (Right, 06/28/2013).  No Known Allergies  No current facility-administered medications on file prior to encounter.   Current Outpatient Prescriptions on File Prior to Encounter  Medication Sig  . feeding supplement, ENSURE ENLIVE, (ENSURE ENLIVE) LIQD Take 237 mLs by mouth 2 (two) times daily between meals.  . ferrous sulfate 325 (65 FE) MG tablet Take 1 tablet by mouth daily.  .  furosemide (LASIX) 40 MG tablet Take 1 tablet (40 mg total) by mouth 2 (two) times daily.  . potassium chloride SA (K-DUR,KLOR-CON) 20 MEQ tablet Take 1 tablet (20 mEq total) by mouth daily.  . pravastatin (PRAVACHOL) 10 MG tablet Take 10 mg by mouth daily.  . vitamin C (ASCORBIC ACID) 500 MG tablet Take 500 mg by mouth daily.  Marland Kitchen warfarin (COUMADIN) 5 MG tablet Take 0.5 tablets (2.5 mg total) by mouth daily at 6 PM. (Patient taking differently: Take 2.5-5 mg by mouth as directed. Take 1 tablet (5 mg) on Sun, Tues, Thurs, Sat and Take 0.5 tablet (2.5 mg) on MWF)  . acetaminophen (TYLENOL) 325 MG tablet Take 2 tablets (650 mg total) by mouth every 6 (six) hours as needed for mild pain, moderate pain, fever or headache (or Fever >/= 101).  . Amino Acids-Protein Hydrolys (FEEDING SUPPLEMENT, PRO-STAT SUGAR FREE 64,) LIQD Take 30 mLs by mouth 2 (two) times daily. (Patient not taking: Reported on 03/04/2016)  . carvedilol (COREG) 3.125 MG tablet Take 1 tablet (3.125 mg total) by mouth 2 (two) times daily with a meal. (Patient not taking: Reported on 03/04/2016)  . polyethylene glycol (MIRALAX / GLYCOLAX) packet Take 17 g by mouth daily. (Patient not taking: Reported on 03/04/2016)  . senna-docusate (SENOKOT-S) 8.6-50 MG tablet Take 2 tablets by mouth at bedtime as needed for mild constipation or moderate constipation.    FAMILY HISTORY:  His indicated that his mother is deceased. He indicated that his father is alive. He indicated that his maternal grandmother is deceased. He indicated that his maternal grandfather is deceased. He indicated that his paternal grandmother is deceased. He indicated that his paternal grandfather is deceased.  SOCIAL HISTORY: He  reports that he has quit smoking. His smoking use included Cigarettes. He started smoking about 28 years ago. He has never used smokeless tobacco. He reports that he drinks about 6.0 oz of alcohol per week. He reports that he does not use illicit  drugs.  REVIEW OF SYSTEMS:   Unable   SUBJECTIVE:  Appears terminally ill  VITAL SIGNS: BP 81/58 mmHg  Pulse 65  Temp(Src) 99.2 F (37.3 C) (Axillary)  Resp 12  Ht 6' (1.829 m)  Wt 335 lb (151.955 kg)  BMI 45.42 kg/m2  SpO2 100%  HEMODYNAMICS:    VENTILATOR SETTINGS:    INTAKE / OUTPUT: I/O last 3 completed shifts: In: 1650 [P.O.:1650] Out: 775 [Urine:775]  PHYSICAL EXAMINATION: General:  Chronically ill appearing white male, appears terminally ill  Neuro:  Arouses. Oriented X3. Profoundly weak HEENT:  Cardiovascular:  irreg irreg  Lungs:  Decreased t/o w/ accessory use  Abdomen:  Obese + bowel sounds  Musculoskeletal:  Generalized weakness  Skin:  Warm, dry, LE 3+ edema red w/ chronic venous stasis changes. Panus red and excoriated   LABS:  BMET  Recent Labs Lab 03/04/16 1635 03/05/16 0415 03/06/16 0413  NA 143 143 142  K 5.3* 5.9* 5.0  CL 113* 115* 114*  CO2 BUN 36* 36* 39*  CREATININE 1.12 1.10 1.21  GLUCOSE 89 117* 129*    Electrolytes  Recent Labs Lab 03/04/16 1635 03/05/16 0415 03/06/16 0413  CALCIUM 9.0 8.7* 8.6*    CBC  Recent Labs Lab 03/04/16 1635 03/05/16 0415 03/06/16 0413  WBC 6.3 6.5 7.4  HGB 12.2* 11.6* 11.1*  HCT 37.2* 36.4* 34.1*  PLT 153 145* 135*    Coag's  Recent Labs Lab 03/04/16 1635 03/05/16 0415 03/06/16 0413  INR 3.09* 3.19* 3.77*    Sepsis Markers  Recent Labs Lab 03/06/16 1320  LATICACIDVEN 1.0    ABG  Recent Labs Lab 03/06/16 1246  PHART 7.253*  PCO2ART 57.8*  PO2ART 86.7    Liver Enzymes No results for input(s): AST, ALT, ALKPHOS, BILITOT, ALBUMIN in the last 168 hours.  Cardiac Enzymes No results for input(s): TROPONINI, PROBNP in the last 168 hours.  Glucose  Recent Labs Lab 03/05/16 2236  GLUCAP 132*    Imaging Dg Chest 1 View  03/06/2016  CLINICAL DATA:  Respiratory distress. SOB. Pt is morbidly obese. Hx of CHF, HTN, and AFIB. EXAM: CHEST 1 VIEW  COMPARISON:  03/05/2016 FINDINGS: Heart is markedly enlarged. There are patchy infiltrates throughout the lungs bilaterally, significantly increased over prior study now partially obscuring the right hemidiaphragm and completely obscuring the left hemidiaphragm. Findings are consistent with pulmonary edema and/or infectious process. IMPRESSION: Diffuse bilateral infiltrates. Electronically Signed   By: Norva Pavlov M.D.   On: 03/06/2016 13:31   Mr Brain Wo Contrast  03/05/2016  CLINICAL DATA:  Lower extremity weakness. Failure thrive. Unable to ambulate. EXAM: MRI HEAD WITHOUT CONTRAST TECHNIQUE: Multiplanar, multiecho pulse sequences of the brain and surrounding structures were obtained without intravenous contrast. COMPARISON:  None. FINDINGS: Examination is mildly motion degraded. There are also regions of decreased signal on some sequences due to inability to use the standard head coil. There is no evidence of acute infarct, intracranial hemorrhage, intra-axial mass, midline shift, or extra-axial fluid collection. Ventricles and sulci are within normal limits for age. Patchy T2 hyperintensities throughout the subcortical and deep cerebral white matter bilaterally are nonspecific but compatible with mild-to-moderate chronic small vessel ischemic disease. 10  x 5 mm extra-axial focus of susceptibility artifact adjacent to the anterolateral right temporal lobe may represent a small exostosis or incidental calcified meningioma. Prior right cataract extraction is noted. Paranasal sinuses are clear. There is a trace left mastoid effusion. Major intracranial vascular flow voids are preserved. IMPRESSION: 1. No acute intracranial abnormality. 2. Mild-to-moderate chronic small vessel ischemic disease. Electronically Signed   By: Sebastian Ache M.D.   On: 03/05/2016 19:43   Mr Lumbar Spine Wo Contrast  03/05/2016  CLINICAL DATA:  Bilateral lower extremity weakness. Failure to thrive. Unable to ambulate for the past  3 weeks. EXAM: MRI LUMBAR SPINE WITHOUT CONTRAST TECHNIQUE: Multiplanar, multisequence MR imaging of the lumbar spine was performed. No intravenous contrast was administered. COMPARISON:  Lumbar spine radiographs 03/04/2016 FINDINGS: The study is largely nondiagnostic due to patient body habitus and resultant poor signal. Sagittal and coronal STIR images are completely nondiagnostic. The anterior lumbar spine is obscured on sagittal T1 images. Axial images were not obtained as the patient could not tolerate additional imaging. There is mild upper lumbar levoscoliosis. There is likely trace retrolisthesis of T12 on L1, L1 on L2, and L2 on L3. Moderate to severe disc space narrowing is present at L1-2 with mild T2 hyperintensity in the disc space on the right. There is mild disc space narrowing at L2-3 with prominent T2 hyperintensity throughout the disc space. Vertebral marrow edema at these levels cannot be adequately assessed for due to nondiagnostic STIR images and artifact through these regions on the T1 series. Assessment of the spinal canal is severely limited, without evidence of critical stenosis on the sagittal T2 series. The distal spinal cord/conus is not well evaluated. IMPRESSION: Largely nondiagnostic study as above. Abnormal fluid signal in the L2-3 disc space. Early infectious discitis cannot be excluded if this is a clinical concern. Electronically Signed   By: Sebastian Ache M.D.   On: 03/05/2016 19:37     STUDIES:    CULTURES: BC 5/25>>>  ANTIBIOTICS: rocephin 5/24>>> azith 5/24>>>  SIGNIFICANT EVENTS:   LINES/TUBES:   DISCUSSION: Acute hypercarbic resp failure d/t PNA +/- pulmonary edema superimposed on underlying OHS and severe deconditioning. There may be a component of edema vs ALI as well. We Confirmed w/ him DNR status and also verified this w/ his brother. If this is evolving ALI little more to offer w/ his goals of care limitations & hypotension is limiting factor for  diuresis. We will cont BIPAP, add neo, cont abx. He is full DNR. No central access either.    ASSESSMENT / PLAN:  PULMONARY A: Acute Hypercarbic Respiratory Failure in setting of bilateral pulmonary infiltrates, Presume CAP w/ ALI +/- edema superimposed on OHS and atelectasis.  ->he has had significant decline for over 8 months. Now presents w/ what is probably a PNA superimposed on underlying OHS and severe deconditioning. There may be a component of edema vs ALI as well. We Confirmed w/ him DNR status and also verified this w/ his brother. If this is evolving ALI little more to offer w/ his goals of care limitations & hypotension is limiting factor for diuresis.   P:   Cont NIPPV No further abgs-->won't change our rx Hold sedation See ID section  Lasix when able    CARDIOLOGY  A: AFIB Chronic diastolic HF Hypotension: does not meet SIRS criteria so sepsis seems unlikely. Not sure if this is medication related.  P:  Hold antihypertensives & diuretics  Will add Neo gtt but pt wishes no invasive  rx so will limit to this and no line  MAP goal >65 Ck cortisol   HEME: A: Macrocytic anemia  Mild thrombocytopenia Coumadin induced coagulopathy  P:  Pharmacy to a/w coumadin dosing Trend CBC Transfuse per ICU protocol    ID: A: Cellulitis and CAP (NOS) P:   Cont abx (see above)   NEURO A: Acute Encephalopathy in setting of hypercarbia +/-  sepsis and residual polypharm P:   Hold narcotics Support w/ NIPPV Treat infection    FAMILY  - Updates: brother updated    Simonne Martinet ACNP-BC [facility] Pulmonary/Critical Care Pager # [phone] OR # [phone] if no answer   03/06/2016, 3:16 PM

## 2016-03-06 NOTE — Consult Note (Signed)
WOC wound consult note Reason for Consult: Macerated and erythematous areas of dermatitis Wound type: Intertriginous dermatitis at the pannus, inguinal areas Pressure Ulcer POA: No Measurement: N/A Wound bed: pink, scant serous exudate Drainage (amount, consistency, odor) Musty odor associated with ineffective or infrequent hygiene efforts Periwound: Intact, dry Dressing procedure/placement/frequency: I will implement a bariatric bed with low air loss feature to assist in the management of microclimate while simultaneously addressing bathing needs and wicking away of perspiration using our house antimicrobial textile, InterDry Ag+. WOC nursing team will not follow, but will remain available to this patient, the nursing and medical teams.  Please re-consult if needed. Thanks, Ladona Mow, MSN, RN, GNP, [place]  Pager# [phone]

## 2016-03-06 NOTE — Progress Notes (Signed)
PT Cancellation Note  Patient Details Name: [name] MRN: [id number] DOB: April 26, 1946   Cancelled Treatment:     PT order received but eval deferred 2* decline in pt status.  Pt to step-down unit for bipap.  Will follow.   Damaria Vachon 03/06/2016, 1:18 PM

## 2016-03-06 NOTE — Progress Notes (Signed)
Patient transferred to [facility] per Dr Vanessa Barbara order.  Attempted to notify emergency contact brother, Gene Jakubek.  Message left for return call.

## 2016-03-06 NOTE — Progress Notes (Signed)
OT Cancellation Note  Patient Details Name: [name] MRN: [id number] DOB: Feb 19, 1946   Cancelled Treatment:    Reason Eval/Treat Not Completed: Medical issues which prohibited therapy. Pt is in the process of moving to SDU for bipap.  Will check back tomorrow.  Krew Hortman 03/06/2016, 1:12 PM  Marica Otter, OTR/L [phone] 03/06/2016

## 2016-03-06 NOTE — Progress Notes (Signed)
Transfer report given to stepdown nurse.

## 2016-03-07 ENCOUNTER — Inpatient Hospital Stay (HOSPITAL_COMMUNITY): Payer: Medicare HMO

## 2016-03-07 DIAGNOSIS — R5381 Other malaise: Secondary | ICD-10-CM

## 2016-03-07 DIAGNOSIS — I482 Chronic atrial fibrillation: Secondary | ICD-10-CM

## 2016-03-07 DIAGNOSIS — J189 Pneumonia, unspecified organism: Principal | ICD-10-CM

## 2016-03-07 DIAGNOSIS — J9621 Acute and chronic respiratory failure with hypoxia: Secondary | ICD-10-CM

## 2016-03-07 DIAGNOSIS — J9622 Acute and chronic respiratory failure with hypercapnia: Secondary | ICD-10-CM

## 2016-03-07 DIAGNOSIS — R579 Shock, unspecified: Secondary | ICD-10-CM

## 2016-03-07 LAB — HIV ANTIBODY (ROUTINE TESTING W REFLEX): HIV Screen 4th Generation wRfx: NONREACTIVE

## 2016-03-07 LAB — CBC
HEMATOCRIT: 34.2 % — AB (ref 39.0–52.0)
Hemoglobin: 10.8 g/dL — ABNORMAL LOW (ref 13.0–17.0)
MCH: 33.8 pg (ref 26.0–34.0)
MCHC: 31.6 g/dL (ref 30.0–36.0)
MCV: 106.9 fL — ABNORMAL HIGH (ref 78.0–100.0)
Platelets: 179 10*3/uL (ref 150–400)
RBC: 3.2 MIL/uL — ABNORMAL LOW (ref 4.22–5.81)
RDW: 16.2 % — AB (ref 11.5–15.5)
WBC: 8.7 10*3/uL (ref 4.0–10.5)

## 2016-03-07 LAB — BASIC METABOLIC PANEL
Anion gap: 4 — ABNORMAL LOW (ref 5–15)
BUN: 41 mg/dL — AB (ref 6–20)
CALCIUM: 8.7 mg/dL — AB (ref 8.9–10.3)
CO2: 25 mmol/L (ref 22–32)
CREATININE: 1.24 mg/dL (ref 0.61–1.24)
Chloride: 113 mmol/L — ABNORMAL HIGH (ref 101–111)
GFR calc Af Amer: >60 mL/min (ref >60)
GFR, EST NON AFRICAN AMERICAN: 58 mL/min — AB (ref >60)
GLUCOSE: 94 mg/dL (ref 65–99)
POTASSIUM: 5.2 mmol/L — AB (ref 3.5–5.1)
Sodium: 142 mmol/L (ref 135–145)

## 2016-03-07 LAB — ECHOCARDIOGRAM COMPLETE
Height: 72 in
Weight: 5360 oz

## 2016-03-07 LAB — PROTIME-INR
INR: 3.56 — AB (ref 0.00–1.49)
Prothrombin Time: 34.8 seconds — ABNORMAL HIGH (ref 11.6–15.2)

## 2016-03-07 LAB — VITAMIN D 25 HYDROXY (VIT D DEFICIENCY, FRACTURES): VIT D 25 HYDROXY: 19.1 ng/mL — AB (ref 30.0–100.0)

## 2016-03-07 MED ORDER — CHLORHEXIDINE GLUCONATE CLOTH 2 % EX PADS
6.0000 | MEDICATED_PAD | Freq: Every day | CUTANEOUS | Status: DC
  Administered 2016-03-07: 6 via TOPICAL

## 2016-03-07 MED ORDER — CHLORHEXIDINE GLUCONATE CLOTH 2 % EX PADS
6.0000 | MEDICATED_PAD | Freq: Every day | CUTANEOUS | Status: AC
  Administered 2016-03-08: 6 via TOPICAL

## 2016-03-07 MED ORDER — LORAZEPAM 2 MG/ML IJ SOLN
0.5000 mg | Freq: Once | INTRAMUSCULAR | Status: AC
  Administered 2016-03-07: 0.5 mg via INTRAVENOUS
  Filled 2016-03-07: qty 1

## 2016-03-07 MED ORDER — CHLORHEXIDINE GLUCONATE CLOTH 2 % EX PADS: 6.0000 | MEDICATED_PAD | Freq: Every day | CUTANEOUS | Status: DC

## 2016-03-07 NOTE — Progress Notes (Signed)
PT Cancellation Note  Patient Details Name: [name] MRN: [id number] DOB: [birth year]   Cancelled Treatment:     PT eval deferred on advice of RN 2* pt's low BP.  Will follow.   Takeshi Teasdale 03/07/2016, 3:15 PM

## 2016-03-07 NOTE — Progress Notes (Addendum)
ANTICOAGULATION CONSULT NOTE -   Pharmacy Consult for Warfarin Indication: atrial fibrillation  No Known Allergies  Patient Measurements: Height: 6' (182.9 cm) Weight: (!) 335 lb (151.955 kg) IBW/kg (Calculated) : 77.6 TBW: weight in January 150kg  Vital Signs: Temp: 98.9 F (37.2 C) (05/27 0800) Temp Source: Axillary (05/27 0800) BP: 122/53 mmHg (05/27 0747) Pulse Rate: 67 (05/27 0747)  Labs:  Recent Labs  03/05/16 0415 03/06/16 0413 03/07/16 0311 03/07/16 0643  HGB 11.6* 11.1*  --  10.8*  HCT 36.4* 34.1*  --  34.2*  PLT 145* 135*  --  179  LABPROT 32.0* 36.4* 34.8*  --   INR 3.19* 3.77* 3.56*  --   CREATININE 1.10 1.21  --  1.24   Estimated Creatinine Clearance: 86.6 mL/min (by C-G formula based on Cr of 1.24).  Assessment: 24 yoM to ED 5/24 with bilateral LE weakness and swelling, RLE pain s/p fall at home 3 days ago. No fx per Xray. Chronic Warfarin for Afib: Home dose 2.5mg  MWF, 5mg  other days,LD 5/23 (2000)  Admit INR 3.09  Home dose lower than expected for large body habitus  03/07/2016  INR supratherapeutic but decreasing  H/H slightly low, decreasing  PLTS back wnl  Diet ordered  On Zithromax  Goal of Therapy:  INR 2-3 Monitor platelets by anticoagulation protocol: Yes   Plan:   No Warfarin tonight  Daily Protime/INR  Monitor CBC, sign/sx bleed  Bernadene Person, PharmD, BCPS Pager: [phone] 03/07/2016, 9:02 AM

## 2016-03-07 NOTE — Progress Notes (Signed)
PULMONARY / CRITICAL CARE MEDICINE   Name: [name] MRN: [id number] DOB: 12/31/1945    ADMISSION DATE:  03/04/2016 CONSULTATION DATE:  5/26  REFERRING MD:  Vanessa Barbara   CHIEF COMPLAINT:  Acute hypercarbic respiratory failure and hypotension   BRIEF:   70 y/o male with multiple comorbid illnesses who developed acute hypercapneic respiratory failure in the setting of narcotic use and HCAP requiring BIPAP and vasopressors.  SUBJECTIVE:  Off BIPAP, on low dose vasopressors  VITAL SIGNS: BP 122/53 mmHg  Pulse 67  Temp(Src) 98.9 F (37.2 C) (Axillary)  Resp 14  Ht 6' (1.829 m)  Wt 335 lb (151.955 kg)  BMI 45.42 kg/m2  SpO2 98%  HEMODYNAMICS:    VENTILATOR SETTINGS: Vent Mode:  [-] BIPAP FiO2 (%):  [40 %] 40 % Set Rate:  [10 bmp-12 bmp] 12 bmp PEEP:  [5 cmH20] 5 cmH20  INTAKE / OUTPUT: I/O last 3 completed shifts: In: 2741.5 [P.O.:1650; I.V.:791.5; IV Piggyback:300] Out: 700 [Urine:700]  PHYSICAL EXAMINATION: General:  Obese, chronically ill appearing male on BIPAP  Neuro:  Wakens to voice, conversant but sleepy, generally weak HEENT: NCAT  CV: Irreg irreg, distant heart sounds Lungs:  Scant crackles bases, normal respiratory effort Abdomen: BS+, soft Musculoskeletal:  No bony abnormalities Skin:  Edema legs bilaterally  LABS:  BMET  Recent Labs Lab 03/05/16 0415 03/06/16 0413 03/07/16 0643  NA 143 142 142  K 5.9* 5.0 5.2*  CL 115* 114* 113*  CO2 23 25 25   BUN 36* 39* 41*  CREATININE 1.10 1.21 1.24  GLUCOSE 117* 129* 94    Electrolytes  Recent Labs Lab 03/05/16 0415 03/06/16 0413 03/07/16 0643  CALCIUM 8.7* 8.6* 8.7*    CBC  Recent Labs Lab 03/05/16 0415 03/06/16 0413 03/07/16 0643  WBC 6.5 7.4 8.7  HGB 11.6* 11.1* 10.8*  HCT 36.4* 34.1* 34.2*  PLT 145* 135* 179    Coag's  Recent Labs Lab 03/05/16 0415 03/06/16 0413 03/07/16 0311  INR 3.19* 3.77* 3.56*    Sepsis Markers  Recent Labs Lab 03/06/16 1320  LATICACIDVEN  1.0    ABG  Recent Labs Lab 03/06/16 1246 03/06/16 1530  PHART 7.253* 7.268*  PCO2ART 57.8* 53.7*  PO2ART 86.7 138*    Liver Enzymes No results for input(s): AST, ALT, ALKPHOS, BILITOT, ALBUMIN in the last 168 hours.  Cardiac Enzymes No results for input(s): TROPONINI, PROBNP in the last 168 hours.  Glucose  Recent Labs Lab 03/05/16 2236  GLUCAP 132*    Imaging Dg Chest 1 View  03/06/2016  CLINICAL DATA:  Respiratory distress. SOB. Pt is morbidly obese. Hx of CHF, HTN, and AFIB. EXAM: CHEST 1 VIEW COMPARISON:  03/05/2016 FINDINGS: Heart is markedly enlarged. There are patchy infiltrates throughout the lungs bilaterally, significantly increased over prior study now partially obscuring the right hemidiaphragm and completely obscuring the left hemidiaphragm. Findings are consistent with pulmonary edema and/or infectious process. IMPRESSION: Diffuse bilateral infiltrates. Electronically Signed   By: Norva Pavlov M.D.   On: 03/06/2016 13:31     STUDIES:    CULTURES: BC 5/25>>>  ANTIBIOTICS: rocephin 5/24>>> azith 5/24>>>  SIGNIFICANT EVENTS:   LINES/TUBES:   DISCUSSION: This is a very chronically ill gentleman with morbid obesity and deconditioning here with CAP causing respiratory failure and septic shock perhaps complicated by pulmonary edema.  His respiratory status has transiently improved as of 5/27 but he remains quite ill over all and has an overall poor prognosis.   ASSESSMENT / PLAN:  PULMONARY A:  Acute on chronic Hypercarbic Respiratory Failure in setting CAP  Likely OSA/OHS Likely acute pulmonary edema DNR  P:   Non-invasive mechanical ventilation prn Incentive spirometry Out of bed as tolerated Hold sedation See ID section  Lasix when able   CARDIOLOGY  A: AFIB Chronic diastolic HF Hypotension: > not in septic shock, unclear etiology, cardiac? P:  Repeat echocardiogram Hold antihypertensives & diuretics  Neosynephrine for MAP  goal >55   ID: A: Cellulitis and CAP (NOS) P:   Cont abx (see above)   NEURO A: Acute Encephalopathy in setting of hypercarbia > improving P:   Hold narcotics Support w/ NIPPV Treat infection   Global> overall prognosis here is poor given his profound deconditioning, obesity.  While he seems to have improved slightly, I don't expect a quick recovery.  Would approach goals of care with family and patient. Will order echocardiogram given shock.  FAMILY  - Updates: brother updated 5/26, none bedside on 5/27  My cc time 35 minutes  Heber [place], MD [facility] Pager: [phone] Cell: [phone] After 3pm or if no response, call [phone]   03/07/2016, 9:44 AM

## 2016-03-07 NOTE — Progress Notes (Signed)
OT Cancellation Note  Patient Details Name: [name] MRN: [id number] DOB: 17-Nov-1945   Cancelled Treatment:    Reason Eval/Treat Not Completed: Medical issues which prohibited therapy.  Pt with decreased BP.  Will check back tomorrow.  Jay Haskew 03/07/2016, 2:04 PM  Marica Otter, OTR/L [phone] 03/07/2016

## 2016-03-07 NOTE — Progress Notes (Signed)
PROGRESS NOTE    [name]  TJQ:[id number] DOB: May 02, 1946 DOA: 03/04/2016 PCP: [name], MD   Brief Narrative: [name] is a 70 year old gentleman with multiple comorbidities including diastolic congestive heart failure, transthoracic echocardiogram performed on 02/07/2015 that revealed an EF of 55-60% with grade 3 diastolic dysfunction, history of atrial fibrillation on chronic anticoagulation with warfarin, strip hypertension, admitted to the medicine service on 03/04/2016 when he presented with complaints of generalized weakness. [name] reporting that he has had a progressive decline over the past 6 months having increasing generalized weakness, difficulties performing activities of daily living. He states that 3 months ago began using a walker up until about a week ago when he had a fall at home. Since his fall he has been unable to ambulate.   Assessment & Plan:   Principal Problem:   FTT (failure to thrive) in adult Active Problems:   Physical deconditioning   Essential hypertension   Chronic atrial fibrillation (HCC)   Anemia of chronic disease   Recurrent falls   Chronic anticoagulation   Diastolic dysfunction-grade 13 January 2015   Weakness of both lower extremities   Respiratory failure (HCC)   CAP (community acquired pneumonia)   1.  Community-acquired pneumonia -[name] presented with a steep functional decline, although it appears that he has had a progressive decline in the past 6 months. -Workup revealing the presence of a medial left lung base opacity consistent with pneumonia. -He was started on empiric IV antibiotic therapy with ceftriaxone and azithromycin -He was placed on pneumonia protocol, obtain blood cultures and sputum cultures -Repeat chest x-ray performed on 03/06/2016 showing diffuse bilateral infiltrates. -Blood cultures are pending  2.  Acute hypercarbic respiratory failure. -ABG showed a pH of 7.25, PCO2 of 57, having mental status  changes. -ABG consistent with primary acute respiratory acidosis, uncompensated. Suspect secondary to community-acquired pneumonia and possibly obesity hypoventilation syndrome/obstructive sleep apnea -He was transferred to the step down unit and placed on BiPAP -Pulmonary critical care medicine consulted  3.  Failure to thrive/functional decline. -[name] reporting a progressive functional decline over the past 6 months, stating that 3 months ago he began using a walker to get around his house then 1 week ago had a fall after which she has been unable to ambulate. -A chest x-ray performed on 03/05/2016 interpreted by radiology as having opacity at the medial left lung base consistent with an infiltrate, new compared to prior exam. -He was started on empiric IV antibiotic therapy with ceftriaxone and azithromycin on 03/05/2016. -Remains weak and severely deconditioned  4.  Chronic diastolic congestive heart failure -Last transthoracic echocardiogram was performed on 01/30/2015 that revealed preserved ejection fraction 55-60% with grade 3 diastolic dysfunction. -Initially diuretic therapy held due to hypotension -Transthoracic echocardiogram performed today, results pending at the time of this dictation.  5.  Hypotension. -He has a history of hypertension and had been on Benicar 40 mg by mouth daily, Carvedilol 6.25 mg by mouth twice a day, and Lasix 40 mg by mouth twice a day. -Case discussed with pulmonary critical care medicine who recommended starting low-dose vasopressor  6.  History of atrial fibrillation -CHADSVasc score of 4 -Remains rate controlled, has mentioned above holding beta blocker therapy due to hypotension. -INR trending up to 3.77 likely resulting from the administration of antibiotics  7.  Hyperkalemia. -Lab work showing potassium of 5.9. -He had been on potassium replacement therapy at home. -He was given D50 with 10 units of insulin on  03/05/2016 -Repeat labs  showing potassium of 5.0    DVT prophylaxis: Patient is fully anticoagulated with warfarin Code Status: Full code Family Communication: I spoke to his brother, updated him on his condition.  Disposition Plan: He will remain in the SDU  Subjective: Remains on pressor support and BiPAP, lethargic however was arousable. Reporting dry mouth.  Objective: Filed Vitals:   03/07/16 0600 03/07/16 0700 03/07/16 0747 03/07/16 0800  BP: 83/43 102/48 122/53   Pulse: 61 61 67   Temp:    98.9 F (37.2 C)  TempSrc:    Axillary  Resp: Height:      Weight:      SpO2: 95% 98% 98%     Intake/Output Summary (Last 24 hours) at 03/07/16 1057 Last data filed at 03/07/16 0700  Gross per 24 hour  Intake 1091.54 ml  Output     50 ml  Net 1041.54 ml   Filed Weights   03/06/16 1414  Weight: 151.955 kg (335 lb)    Examination:  General exam: He is lethargic on exam however arousable and can answer questions. Respiratory system: Diminished breath sounds bilaterally, I did not auscultate wheezing crackles or rhonchi. Cardiovascular system: S1 & S2 heard, RRR. No JVD, murmurs, rubs, gallops or clicks. No pedal edema. Gastrointestinal system: Abdomen is nondistended, soft and nontender. No organomegaly or masses felt. Normal bowel sounds heard. Central nervous system: He has 3-5 muscle strength to bilateral lower extremities Extremities: Symmetric 5 x 5 power. Skin: There are chronic venous stasis changes noted to bilateral lower extremities. Examined his back there is no evidence of decubitus ulcers. Psychiatry: He appears lethargic however arousable.   Data Reviewed: I have personally reviewed following labs and imaging studies  CBC:  Recent Labs Lab 03/04/16 1635 03/05/16 0415 03/06/16 0413 03/07/16 0643  WBC 6.3 6.5 7.4 8.7  NEUTROABS 5.0  --   --   --   HGB 12.2* 11.6* 11.1* 10.8*  HCT 37.2* 36.4* 34.1* 34.2*  MCV 102.2* 106.7* 106.6* 106.9*  PLT 153 145* 135* 179    Basic Metabolic Panel:  Recent Labs Lab 03/04/16 1635 03/05/16 0415 03/06/16 0413 03/07/16 0643  NA 143 143 142 142  K 5.3* 5.9* 5.0 5.2*  CL 113* 115* 114* 113*  CO2 GLUCOSE 89 117* 129* 94  BUN 36* 36* 39* 41*  CREATININE 1.12 1.10 1.21 1.24  CALCIUM 9.0 8.7* 8.6* 8.7*   GFR: Estimated Creatinine Clearance: 86.6 mL/min (by C-G formula based on Cr of 1.24). Liver Function Tests: No results for input(s): AST, ALT, ALKPHOS, BILITOT, PROT, ALBUMIN in the last 168 hours. No results for input(s): LIPASE, AMYLASE in the last 168 hours. No results for input(s): AMMONIA in the last 168 hours. Coagulation Profile:  Recent Labs Lab 03/04/16 1635 03/05/16 0415 03/06/16 0413 03/07/16 0311  INR 3.09* 3.19* 3.77* 3.56*   Cardiac Enzymes: No results for input(s): CKTOTAL, CKMB, CKMBINDEX, TROPONINI in the last 168 hours. BNP (last 3 results) No results for input(s): PROBNP in the last 8760 hours. HbA1C: No results for input(s): HGBA1C in the last 72 hours. CBG:  Recent Labs Lab 03/05/16 2236  GLUCAP 132*   Lipid Profile: No results for input(s): CHOL, HDL, LDLCALC, TRIG, CHOLHDL, LDLDIRECT in the last 72 hours. Thyroid Function Tests:  Recent Labs  03/06/16 0849  TSH 0.968   Anemia Panel:  Recent Labs  03/06/16 0849  VITAMINB12 883  FOLATE 7.7   Sepsis  Labs:  Recent Labs Lab 03/06/16 1320  LATICACIDVEN 1.0    Recent Results (from the past 240 hour(s))  MRSA PCR Screening     Status: Abnormal   Collection Time: 03/05/16  1:56 AM  Result Value Ref Range Status   MRSA by PCR POSITIVE (A) NEGATIVE Final    Comment:        The GeneXpert MRSA Assay (FDA approved for NASAL specimens only), is one component of a comprehensive MRSA colonization surveillance program. It is not intended to diagnose MRSA infection nor to guide or monitor treatment for MRSA infections. RESULT CALLED TO, READ BACK BY AND VERIFIED WITH: Lynelle Doctor RN @ [phone] ON  03/05/16 BY C DAVIS   Culture, blood (routine x 2)     Status: None (Preliminary result)   Collection Time: 03/06/16  1:42 PM  Result Value Ref Range Status   Specimen Description BLOOD RIGHT ARM  Final   Special Requests IN PEDIATRIC BOTTLE 2CC  Final   Culture   Final    NO GROWTH < 24 HOURS Performed at [facility]    Report Status PENDING  Incomplete         Radiology Studies: Dg Chest 1 View  03/06/2016  CLINICAL DATA:  Respiratory distress. SOB. Pt is morbidly obese. Hx of CHF, HTN, and AFIB. EXAM: CHEST 1 VIEW COMPARISON:  03/05/2016 FINDINGS: Heart is markedly enlarged. There are patchy infiltrates throughout the lungs bilaterally, significantly increased over prior study now partially obscuring the right hemidiaphragm and completely obscuring the left hemidiaphragm. Findings are consistent with pulmonary edema and/or infectious process. IMPRESSION: Diffuse bilateral infiltrates. Electronically Signed   By: Norva Pavlov M.D.   On: 03/06/2016 13:31   Dg Chest 1 View  03/05/2016  CLINICAL DATA:  Failure to thrive, weakness, fatigue. Hx of CHF, AFIB, hypertension. EXAM: CHEST 1 VIEW COMPARISON:  02/23/2016 FINDINGS: Marked enlargement of the cardiac silhouette. There is opacity at the medial left lung base, consistent with infiltrate, new since prior. No overt edema. No pleural effusions identified. IMPRESSION: Stable, moderate to marked cardiomegaly. Next item interval development of left lower lobe infiltrate. Electronically Signed   By: Norva Pavlov M.D.   On: 03/05/2016 15:33   Richard Brain Wo Contrast  03/05/2016  CLINICAL DATA:  Lower extremity weakness. Failure thrive. Unable to ambulate. EXAM: MRI HEAD WITHOUT CONTRAST TECHNIQUE: Multiplanar, multiecho pulse sequences of the brain and surrounding structures were obtained without intravenous contrast. COMPARISON:  None. FINDINGS: Examination is mildly motion degraded. There are also regions of decreased signal on  some sequences due to inability to use the standard head coil. There is no evidence of acute infarct, intracranial hemorrhage, intra-axial mass, midline shift, or extra-axial fluid collection. Ventricles and sulci are within normal limits for age. Patchy T2 hyperintensities throughout the subcortical and deep cerebral white matter bilaterally are nonspecific but compatible with mild-to-moderate chronic small vessel ischemic disease. 10 x 5 mm extra-axial focus of susceptibility artifact adjacent to the anterolateral right temporal lobe may represent a small exostosis or incidental calcified meningioma. Prior right cataract extraction is noted. Paranasal sinuses are clear. There is a trace left mastoid effusion. Major intracranial vascular flow voids are preserved. IMPRESSION: 1. No acute intracranial abnormality. 2. Mild-to-moderate chronic small vessel ischemic disease. Electronically Signed   By: Sebastian Ache M.D.   On: 03/05/2016 19:43   Richard Lumbar Spine Wo Contrast  03/05/2016  CLINICAL DATA:  Bilateral lower extremity weakness. Failure to thrive. Unable to ambulate for the  past 3 weeks. EXAM: MRI LUMBAR SPINE WITHOUT CONTRAST TECHNIQUE: Multiplanar, multisequence Richard imaging of the lumbar spine was performed. No intravenous contrast was administered. COMPARISON:  Lumbar spine radiographs 03/04/2016 FINDINGS: The study is largely nondiagnostic due to patient body habitus and resultant poor signal. Sagittal and coronal STIR images are completely nondiagnostic. The anterior lumbar spine is obscured on sagittal T1 images. Axial images were not obtained as the patient could not tolerate additional imaging. There is mild upper lumbar levoscoliosis. There is likely trace retrolisthesis of T12 on L1, L1 on L2, and L2 on L3. Moderate to severe disc space narrowing is present at L1-2 with mild T2 hyperintensity in the disc space on the right. There is mild disc space narrowing at L2-3 with prominent T2 hyperintensity  throughout the disc space. Vertebral marrow edema at these levels cannot be adequately assessed for due to nondiagnostic STIR images and artifact through these regions on the T1 series. Assessment of the spinal canal is severely limited, without evidence of critical stenosis on the sagittal T2 series. The distal spinal cord/conus is not well evaluated. IMPRESSION: Largely nondiagnostic study as above. Abnormal fluid signal in the L2-3 disc space. Early infectious discitis cannot be excluded if this is a clinical concern. Electronically Signed   By: Sebastian Ache M.D.   On: 03/05/2016 19:37        Scheduled Meds: . allopurinol  100 mg Oral Daily  . antiseptic oral rinse  7 mL Mouth Rinse q12n4p  . azithromycin  500 mg Intravenous Q24H  . cefTRIAXone (ROCEPHIN)  IV  1 g Intravenous Q24H  . chlorhexidine  15 mL Mouth Rinse BID  . Chlorhexidine Gluconate Cloth  6 each Topical Q0600  . feeding supplement (ENSURE ENLIVE)  237 mL Oral BID BM  . ferrous sulfate  325 mg Oral Daily  . mupirocin ointment  1 application Nasal BID  . pravastatin  10 mg Oral q1800  . sodium chloride flush  3 mL Intravenous Q12H   Continuous Infusions: . sodium chloride 10 mL/hr at 03/07/16 0600  . phenylephrine (NEO-SYNEPHRINE) Adult infusion 60 mcg/min (03/07/16 0605)     LOS: 1 day    Time spent: 35 min    Jeralyn Bennett, MD [facility] Pager [phone]  If 7PM-7AM, please contact night-coverage [email or web address] Password [facility] 03/07/2016, 10:57 AM

## 2016-03-07 NOTE — Progress Notes (Signed)
*  PRELIMINARY RESULTS* Echocardiogram 2D Echocardiogram has been performed.  [name] 03/07/2016, 12:01 PM

## 2016-03-07 NOTE — Progress Notes (Signed)
Pt alert, removed bipap mask and placed on 3L nasal cannula. Pt tolerating well.

## 2016-03-08 ENCOUNTER — Inpatient Hospital Stay (HOSPITAL_COMMUNITY): Payer: Medicare HMO

## 2016-03-08 DIAGNOSIS — J9612 Chronic respiratory failure with hypercapnia: Secondary | ICD-10-CM

## 2016-03-08 DIAGNOSIS — E662 Morbid (severe) obesity with alveolar hypoventilation: Secondary | ICD-10-CM

## 2016-03-08 DIAGNOSIS — I2609 Other pulmonary embolism with acute cor pulmonale: Secondary | ICD-10-CM

## 2016-03-08 DIAGNOSIS — I519 Heart disease, unspecified: Secondary | ICD-10-CM

## 2016-03-08 LAB — BASIC METABOLIC PANEL
Anion gap: 5 (ref 5–15)
BUN: 44 mg/dL — AB (ref 6–20)
CO2: 25 mmol/L (ref 22–32)
Calcium: 8.9 mg/dL (ref 8.9–10.3)
Chloride: 113 mmol/L — ABNORMAL HIGH (ref 101–111)
Creatinine, Ser: 1.14 mg/dL (ref 0.61–1.24)
GFR calc Af Amer: >60 mL/min (ref >60)
GLUCOSE: 110 mg/dL — AB (ref 65–99)
POTASSIUM: 5.2 mmol/L — AB (ref 3.5–5.1)
Sodium: 143 mmol/L (ref 135–145)

## 2016-03-08 LAB — CBC
HEMATOCRIT: 33.6 % — AB (ref 39.0–52.0)
Hemoglobin: 10.6 g/dL — ABNORMAL LOW (ref 13.0–17.0)
MCH: 33.7 pg (ref 26.0–34.0)
MCHC: 31.5 g/dL (ref 30.0–36.0)
MCV: 106.7 fL — ABNORMAL HIGH (ref 78.0–100.0)
Platelets: 200 10*3/uL (ref 150–400)
RBC: 3.15 MIL/uL — ABNORMAL LOW (ref 4.22–5.81)
RDW: 16.2 % — AB (ref 11.5–15.5)
WBC: 10.4 10*3/uL (ref 4.0–10.5)

## 2016-03-08 LAB — PROTIME-INR
INR: 3.91 — AB (ref 0.00–1.49)
PROTHROMBIN TIME: 37.3 s — AB (ref 11.6–15.2)

## 2016-03-08 MED ORDER — LORAZEPAM 2 MG/ML IJ SOLN
0.0000 mg | Freq: Four times a day (QID) | INTRAMUSCULAR | Status: AC
Start: 2016-03-08 — End: 2016-03-10

## 2016-03-08 MED ORDER — ADULT MULTIVITAMIN W/MINERALS CH
1.0000 | ORAL_TABLET | Freq: Every day | ORAL | Status: DC
  Administered 2016-03-09 – 2016-03-16 (×8): 1 via ORAL
  Filled 2016-03-08 (×9): qty 1

## 2016-03-08 MED ORDER — LORAZEPAM 2 MG/ML IJ SOLN
1.0000 mg | Freq: Four times a day (QID) | INTRAMUSCULAR | Status: DC | PRN
  Administered 2016-03-10: 1 mg via INTRAVENOUS
  Filled 2016-03-08: qty 1

## 2016-03-08 MED ORDER — FOLIC ACID 1 MG PO TABS
1.0000 mg | ORAL_TABLET | Freq: Every day | ORAL | Status: DC
  Administered 2016-03-09 – 2016-03-16 (×8): 1 mg via ORAL
  Filled 2016-03-08 (×9): qty 1

## 2016-03-08 MED ORDER — FUROSEMIDE 10 MG/ML IJ SOLN
40.0000 mg | Freq: Four times a day (QID) | INTRAMUSCULAR | Status: AC
  Administered 2016-03-08 (×2): 40 mg via INTRAVENOUS
  Filled 2016-03-08 (×2): qty 4

## 2016-03-08 MED ORDER — VITAMIN B-1 100 MG PO TABS
100.0000 mg | ORAL_TABLET | Freq: Every day | ORAL | Status: DC
  Administered 2016-03-09 – 2016-03-16 (×8): 100 mg via ORAL
  Filled 2016-03-08 (×9): qty 1

## 2016-03-08 MED ORDER — LORAZEPAM 2 MG/ML IJ SOLN
0.0000 mg | Freq: Two times a day (BID) | INTRAMUSCULAR | Status: DC
Start: 2016-03-10 — End: 2016-03-11
  Administered 2016-03-10: 1 mg via INTRAVENOUS
  Filled 2016-03-08: qty 1

## 2016-03-08 MED ORDER — THIAMINE HCL 100 MG/ML IJ SOLN: 100.0000 mg | Freq: Every day | INTRAMUSCULAR | Status: DC

## 2016-03-08 MED ORDER — LORAZEPAM 1 MG PO TABS: 1.0000 mg | ORAL_TABLET | Freq: Four times a day (QID) | ORAL | Status: DC | PRN

## 2016-03-08 NOTE — Progress Notes (Addendum)
ANTICOAGULATION CONSULT NOTE -   Pharmacy Consult for Warfarin Indication: atrial fibrillation  No Known Allergies  Patient Measurements: Height: 6' (182.9 cm) Weight: (!) 335 lb (151.955 kg) IBW/kg (Calculated) : 77.6 TBW: weight in January 150kg  Vital Signs: Temp: 98.1 F (36.7 C) (05/28 0330) Temp Source: Axillary (05/28 0330) BP: 106/59 mmHg (05/28 0930) Pulse Rate: 72 (05/28 0930)  Labs:  Recent Labs  03/06/16 0413 03/07/16 0311 03/07/16 0643 03/08/16 0317  HGB 11.1*  --  10.8* 10.6*  HCT 34.1*  --  34.2* 33.6*  PLT 135*  --  179 200  LABPROT 36.4* 34.8*  --  37.3*  INR 3.77* 3.56*  --  3.91*  CREATININE 1.21  --  1.24 1.14   Estimated Creatinine Clearance: 94.2 mL/min (by C-G formula based on Cr of 1.14).  Assessment: 3 yoM to ED 5/24 with bilateral LE weakness and swelling, RLE pain s/p fall at home 3 days ago. No fx per Xray. Chronic Warfarin for Afib: Home dose 2.5mg  MWF, 5mg  other days,LD 5/23 (2000)  Admit INR 3.09  Home dose lower than expected for large body habitus  03/08/2016  INR supratherapeutic; had been improving but now rising again despite resumption of diet and good intake.  Likely d/t abx.  Also Hx CHF; no recent BNP but could have some degree of hepatic congestion as +3L since admit  H/H slightly low, decreasing  PLTS back wnl  Diet ordered  On Zithromax  Goal of Therapy:  INR 2-3 Monitor platelets by anticoagulation protocol: Yes   Plan:   No Warfarin tonight  Daily Protime/INR  Monitor CBC, sign/sx bleed  Bernadene Person, PharmD, BCPS Pager: [phone] 03/08/2016, 9:48 AM

## 2016-03-08 NOTE — Progress Notes (Signed)
PULMONARY / CRITICAL CARE MEDICINE   Name: [name] MRN: [id number] DOB: Jul 22, 1946    ADMISSION DATE:  03/04/2016 CONSULTATION DATE:  5/26  REFERRING MD:  Vanessa Barbara   CHIEF COMPLAINT:  Acute hypercarbic respiratory failure and hypotension   BRIEF:   70 y/o male with multiple comorbid illnesses who developed acute hypercapneic respiratory failure in the setting of narcotic use and HCAP requiring BIPAP and vasopressors.  SUBJECTIVE:  Slept on BiPAP overnight Still on vasopressors No acute events  VITAL SIGNS: BP 106/59 mmHg  Pulse 72  Temp(Src) 98.1 F (36.7 C) (Axillary)  Resp 16  Ht 6' (1.829 m)  Wt 151.955 kg (335 lb)  BMI 45.42 kg/m2  SpO2 96%  HEMODYNAMICS:    VENTILATOR SETTINGS: Vent Mode:  [-] BIPAP;PCV;Spontaneous FiO2 (%):  [30 %-40 %] 30 % Set Rate:  [12 bmp] 12 bmp PEEP:  [5 cmH20] 5 cmH20  INTAKE / OUTPUT: I/O last 3 completed shifts: In: 1909.8 [P.O.:120; I.V.:1489.8; IV Piggyback:300] Out: -   PHYSICAL EXAMINATION: General:  Obese, chronically ill appearing male   Neuro:  Somnolent on my exam, generally weak HEENT: NCAT  CV: Irreg irreg, distant heart sounds Lungs:  Scant crackles bases, normal respiratory effort Abdomen: BS+, soft Musculoskeletal:  No bony abnormalities Skin:  Edema legs bilaterally  LABS:  BMET  Recent Labs Lab 03/06/16 0413 03/07/16 0643 03/08/16 0317  NA 142 142 143  K 5.0 5.2* 5.2*  CL 114* 113* 113*  CO2 25 25 25   BUN 39* 41* 44*  CREATININE 1.21 1.24 1.14  GLUCOSE 129* 94 110*    Electrolytes  Recent Labs Lab 03/06/16 0413 03/07/16 0643 03/08/16 0317  CALCIUM 8.6* 8.7* 8.9    CBC  Recent Labs Lab 03/06/16 0413 03/07/16 0643 03/08/16 0317  WBC 7.4 8.7 10.4  HGB 11.1* 10.8* 10.6*  HCT 34.1* 34.2* 33.6*  PLT 135* 179 200    Coag's  Recent Labs Lab 03/06/16 0413 03/07/16 0311 03/08/16 0317  INR 3.77* 3.56* 3.91*    Sepsis Markers  Recent Labs Lab 03/06/16 1320   LATICACIDVEN 1.0    ABG  Recent Labs Lab 03/06/16 1246 03/06/16 1530  PHART 7.253* 7.268*  PCO2ART 57.8* 53.7*  PO2ART 86.7 138*    Liver Enzymes No results for input(s): AST, ALT, ALKPHOS, BILITOT, ALBUMIN in the last 168 hours.  Cardiac Enzymes No results for input(s): TROPONINI, PROBNP in the last 168 hours.  Glucose  Recent Labs Lab 03/05/16 2236  GLUCAP 132*    Imaging 03/08/2016 chest x-ray images personally reviewed showing bi-basilar atelectasis, possible pulmonary edema and pleural effusions  STUDIES:  03/07/2016 echocardiogram showed normal LV size and function but significant dilation of the right ventricle and right atrium  CULTURES: BC 5/25>>>  ANTIBIOTICS: rocephin 5/24>>> azith 5/24>>>  SIGNIFICANT EVENTS:   LINES/TUBES:   DISCUSSION: This is a very chronically ill gentleman with morbid obesity and deconditioning here with CAP causing respiratory failure.  He appears to have decompensated cor pulmonale secondary to obesity hypoventilation syndrome. Echocardiogram on 03/07/2016 showed evidence of this. Considering his profound deconditioning and morbid obesity this will be a very difficult problem to treat. Overall poor prognosis.    ASSESSMENT / PLAN:  PULMONARY A: Acute on chronic Hypercarbic Respiratory Failure in setting CAP  Likely OSA/OHS Acute pulmonary edema DNR  P:   Non-invasive mechanical ventilation daily at bedtime Incentive spirometry Out of bed as tolerated Hold sedation See ID section  Lasix today   CARDIOLOGY  A: Decompensated cor  pulmonale  AFIB Chronic diastolic HF Hypotension: > not in septic shock, unclear etiology, cardiac? P:  Lasix 2 doses today Continue Neo-Synephrine for mean arterial pressure goal greater than 55 Hold antihypertensives & diuretics    ID: A: Cellulitis and CAP (NOS) P:   Cont abx (see above) for 7 day course   NEURO A: Acute Encephalopathy in setting of hypercarbia >  improving P:   Hold narcotics and sedating medicines  Global> overall prognosis. If he does not show improvement in the next 1-2 days with diuresis would consider palliative care involvement.  FAMILY  - Updates: brother updated 5/26, none bedside on 5/28  Discussed with Dr. Vanessa Barbara  My cc time 33 minutes  Heber Altona, MD [place] [facility] Pager: [phone] Cell: [phone] After 3pm or if no response, call [phone]   03/08/2016, 11:59 AM

## 2016-03-08 NOTE — Progress Notes (Signed)
PROGRESS NOTE    [name]  ZOX:[id number] DOB: Apr 22, 1946 DOA: 03/04/2016 PCP: [name], MD   Brief Narrative: [name] is a 70 year old gentleman with multiple comorbidities including diastolic congestive heart failure, transthoracic echocardiogram performed on 02/07/2015 that revealed an EF of 55-60% with grade 3 diastolic dysfunction, history of atrial fibrillation on chronic anticoagulation with warfarin, strip hypertension, admitted to the medicine service on 03/04/2016 when he presented with complaints of generalized weakness. [name] reporting that he has had a progressive decline over the past 6 months having increasing generalized weakness, difficulties performing activities of daily living. He states that 3 months ago began using a walker up until about a week ago when he had a fall at home. Since his fall he has been unable to ambulate.   Assessment & Plan:   Principal Problem:   FTT (failure to thrive) in adult Active Problems:   Physical deconditioning   Essential hypertension   Chronic atrial fibrillation (HCC)   Anemia of chronic disease   Recurrent falls   Chronic anticoagulation   Diastolic dysfunction-grade 13 January 2015   Weakness of both lower extremities   Respiratory failure (HCC)   CAP (community acquired pneumonia)   1.  Community-acquired pneumonia -[name] presented with a steep functional decline, although it appears that he has had a progressive decline in the past 6 months. -Workup revealing the presence of a medial left lung base opacity consistent with pneumonia. -He was started on empiric IV antibiotic therapy with ceftriaxone and azithromycin -He was placed on pneumonia protocol, obtain blood cultures and sputum cultures -Repeat chest x-ray on 03/08/2016 showing pulmonary edema possibly persistent bibasilar airspace opacities. He remains on BiPAP.   2.  Acute hypercarbic respiratory failure. -ABG showed a pH of 7.25, PCO2 of 57,  having mental status changes. -ABG consistent with primary acute respiratory acidosis, uncompensated. Suspect secondary to community-acquired pneumonia and possibly obesity hypoventilation syndrome/obstructive sleep apnea -He was transferred to the step down unit and placed on BiPAP -He has made little clinical improvement over the past 24 hours.  3.  Failure to thrive/functional decline. -[name] reporting a progressive functional decline over the past 6 months, stating that 3 months ago he began using a walker to get around his house then 1 week ago had a fall after which she has been unable to ambulate. -A chest x-ray performed on 03/05/2016 interpreted by radiology as having opacity at the medial left lung base consistent with an infiltrate, new compared to prior exam. -He was started on empiric IV antibiotic therapy with ceftriaxone and azithromycin on 03/05/2016. -Remains weak and severely deconditioned  4.  Chronic diastolic congestive heart failure/cor pulmonale -Last transthoracic echocardiogram was performed on 01/30/2015 that revealed preserved ejection fraction 55-60% with grade 3 diastolic dysfunction. -Transthoracic echocardiogram performed on 03/07/2016 showing severe LVH, ejection fraction 55-60%, right ventricle cavity size severely dilated. Likely has cor pulmonale.  -Case discussed with [name], will give a trial of IV Lasix today   5.  Hypotension. -He has a history of hypertension and had been on Benicar 40 mg by mouth daily, Carvedilol 6.25 mg by mouth twice a day, and Lasix 40 mg by mouth twice a day. -He remains on pressor support with neo-synephrine  6.  History of atrial fibrillation -CHADSVasc score of 4 -Remains rate controlled, has mentioned above holding beta blocker therapy due to hypotension. -INR trending up to 3.77 likely resulting from the administration of antibiotics  7.  Hyperkalemia. -Lab work  showing potassium of 5.9. -He had been on potassium  replacement therapy at home. -He was given D50 with 10 units of insulin on 03/05/2016 -A.m. lab work on 02/29/2016 showing potassium of 5.2  8.  History of alcohol abuse. -Family members reporting that he had been drinking beer on a daily basis. -It appear to have some tremors on exam, was placed on CIWA protocol for ETOH withdrawal.  9.  Medical goals of care. -Patient stated that he would not want to undergo cardiopulmonary resuscitation in the event of arrest. Patient had been declining for the past 6 months. I' make his overall prognosis is poor given significant comorbidities I discussed case with [name] of pulmonary medicine,    DVT prophylaxis: Patient is fully anticoagulated  Code Status: DO NOT RESUSCITATE Family Communication: I called his brother [name] on 03/08/2016, updated him on his brother's condition.   Disposition Plan: He will remain in the SDU  Subjective: Remains on pressor support and BiPAP, lethargic however was arousable. NPO  Objective: Filed Vitals:   03/08/16 0800 03/08/16 0830 03/08/16 0900 03/08/16 0930  BP: 117/59 101/53  106/59  Pulse: 67 66 73 72  Temp:      TempSrc:      Resp: 24 15 15 16   Height:      Weight:      SpO2: 96% 96% 90% 96%    Intake/Output Summary (Last 24 hours) at 03/08/16 1241 Last data filed at 03/08/16 0900  Gross per 24 hour  Intake 1123.49 ml  Output      0 ml  Net 1123.49 ml   Filed Weights   03/06/16 1414  Weight: 151.955 kg (335 lb)    Examination:  General exam: He is lethargic on exam however arousable and can answer questions. Respiratory system: Diminished breath sounds bilaterally, I did not auscultate wheezing crackles or rhonchi. Cardiovascular system: S1 & S2 heard, RRR. No JVD, murmurs, rubs, gallops or clicks. No pedal edema. Gastrointestinal system: Abdomen is nondistended, soft and nontender. No organomegaly or masses felt. Normal bowel sounds heard. Central nervous system: He has 3-5 muscle  strength to bilateral lower extremities Extremities: Symmetric 5 x 5 power. Skin: There are chronic venous stasis changes noted to bilateral lower extremities. Examined his back there is no evidence of decubitus ulcers. Psychiatry: He appears lethargic however arousable.   Data Reviewed: I have personally reviewed following labs and imaging studies  CBC:  Recent Labs Lab 03/04/16 1635 03/05/16 0415 03/06/16 0413 03/07/16 0643 03/08/16 0317  WBC 6.3 6.5 7.4 8.7 10.4  NEUTROABS 5.0  --   --   --   --   HGB 12.2* 11.6* 11.1* 10.8* 10.6*  HCT 37.2* 36.4* 34.1* 34.2* 33.6*  MCV 102.2* 106.7* 106.6* 106.9* 106.7*  PLT 153 145* 135* 179 200   Basic Metabolic Panel:  Recent Labs Lab 03/04/16 1635 03/05/16 0415 03/06/16 0413 03/07/16 0643 03/08/16 0317  NA 143 143 142 142 143  K 5.3* 5.9* 5.0 5.2* 5.2*  CL 113* 115* 114* 113* 113*  CO2 22 23 25 25 25   GLUCOSE 89 117* 129* 94 110*  BUN 36* 36* 39* 41* 44*  CREATININE 1.12 1.10 1.21 1.24 1.14  CALCIUM 9.0 8.7* 8.6* 8.7* 8.9   GFR: Estimated Creatinine Clearance: 94.2 mL/min (by C-G formula based on Cr of 1.14). Liver Function Tests: No results for input(s): AST, ALT, ALKPHOS, BILITOT, PROT, ALBUMIN in the last 168 hours. No results for input(s): LIPASE, AMYLASE in the last 168 hours.  No results for input(s): AMMONIA in the last 168 hours. Coagulation Profile:  Recent Labs Lab 03/04/16 1635 03/05/16 0415 03/06/16 0413 03/07/16 0311 03/08/16 0317  INR 3.09* 3.19* 3.77* 3.56* 3.91*   Cardiac Enzymes: No results for input(s): CKTOTAL, CKMB, CKMBINDEX, TROPONINI in the last 168 hours. BNP (last 3 results) No results for input(s): PROBNP in the last 8760 hours. HbA1C: No results for input(s): HGBA1C in the last 72 hours. CBG:  Recent Labs Lab 03/05/16 2236  GLUCAP 132*   Lipid Profile: No results for input(s): CHOL, HDL, LDLCALC, TRIG, CHOLHDL, LDLDIRECT in the last 72 hours. Thyroid Function Tests:  Recent  Labs  03/06/16 0849  TSH 0.968   Anemia Panel:  Recent Labs  03/06/16 0849  VITAMINB12 883  FOLATE 7.7   Sepsis Labs:  Recent Labs Lab 03/06/16 1320  LATICACIDVEN 1.0    Recent Results (from the past 240 hour(s))  MRSA PCR Screening     Status: Abnormal   Collection Time: 03/05/16  1:56 AM  Result Value Ref Range Status   MRSA by PCR POSITIVE (A) NEGATIVE Final    Comment:        The GeneXpert MRSA Assay (FDA approved for NASAL specimens only), is one component of a comprehensive MRSA colonization surveillance program. It is not intended to diagnose MRSA infection nor to guide or monitor treatment for MRSA infections. RESULT CALLED TO, READ BACK BY AND VERIFIED WITH: Lynelle Doctor RN @ [phone] ON 03/05/16 BY C DAVIS   Culture, blood (routine x 2)     Status: None (Preliminary result)   Collection Time: 03/06/16  1:20 PM  Result Value Ref Range Status   Specimen Description BLOOD RIGHT HAND  Final   Special Requests IN PEDIATRIC BOTTLE 4CC  Final   Culture   Final    NO GROWTH < 24 HOURS Performed at [facility]    Report Status PENDING  Incomplete  Culture, blood (routine x 2)     Status: None (Preliminary result)   Collection Time: 03/06/16  1:42 PM  Result Value Ref Range Status   Specimen Description BLOOD RIGHT ARM  Final   Special Requests IN PEDIATRIC BOTTLE 2CC  Final   Culture   Final    NO GROWTH < 24 HOURS Performed at [facility]    Report Status PENDING  Incomplete         Radiology Studies: Dg Chest 1 View  03/08/2016  CLINICAL DATA:  70 year old male with shortness of breath EXAM: CHEST 1 VIEW COMPARISON:  Prior chest x-ray 03/06/2016 FINDINGS: Stable enlargement of the cardiopericardial silhouette. Slightly improved pulmonary edema. Persistent bibasilar airspace opacities and pulmonary vascular congestion. No significant increase in airspace opacity. No pneumothorax. No acute osseous abnormality. IMPRESSION: 1. Slightly  improved pulmonary edema. 2. Persistent bibasilar airspace opacities which may reflect small layering effusions with atelectasis and/or infiltrate. Electronically Signed   By: Malachy Moan M.D.   On: 03/08/2016 09:35   Dg Chest 1 View  03/06/2016  CLINICAL DATA:  Respiratory distress. SOB. Pt is morbidly obese. Hx of CHF, HTN, and AFIB. EXAM: CHEST 1 VIEW COMPARISON:  03/05/2016 FINDINGS: Heart is markedly enlarged. There are patchy infiltrates throughout the lungs bilaterally, significantly increased over prior study now partially obscuring the right hemidiaphragm and completely obscuring the left hemidiaphragm. Findings are consistent with pulmonary edema and/or infectious process. IMPRESSION: Diffuse bilateral infiltrates. Electronically Signed   By: Norva Pavlov M.D.   On: 03/06/2016 13:31  Scheduled Meds: . allopurinol  100 mg Oral Daily  . antiseptic oral rinse  7 mL Mouth Rinse q12n4p  . azithromycin  500 mg Intravenous Q24H  . cefTRIAXone (ROCEPHIN)  IV  1 g Intravenous Q24H  . chlorhexidine  15 mL Mouth Rinse BID  . feeding supplement (ENSURE ENLIVE)  237 mL Oral BID BM  . ferrous sulfate  325 mg Oral Daily  . folic acid  1 mg Oral Daily  . furosemide  40 mg Intravenous Q6H  . LORazepam  0-4 mg Intravenous Q6H   Followed by  . [START ON 03/10/2016] LORazepam  0-4 mg Intravenous Q12H  . multivitamin with minerals  1 tablet Oral Daily  . mupirocin ointment  1 application Nasal BID  . pravastatin  10 mg Oral q1800  . sodium chloride flush  3 mL Intravenous Q12H  . thiamine  100 mg Oral Daily   Or  . thiamine  100 mg Intravenous Daily   Continuous Infusions: . sodium chloride 10 mL/hr at 03/08/16 0900  . phenylephrine (NEO-SYNEPHRINE) Adult infusion 70 mcg/min (03/08/16 1044)     LOS: 2 days    Time spent: 35 min    Jeralyn Bennett, MD [facility] Pager [phone]  If 7PM-7AM, please contact night-coverage [email or web address] Password  [facility] 03/08/2016, 12:41 PM

## 2016-03-08 NOTE — Evaluation (Signed)
Physical Therapy Evaluation Patient Details Name: [name] MRN: [id number] DOB: 02/27/1946 Today's Date: 03/08/2016   History of Present Illness  Mr Caine is a 70 year old gentleman with multiple comorbidities including diastolic congestive heart failure, transthoracic echocardiogram performed on 02/07/2015 that revealed an EF of 55-60% with grade 3 diastolic dysfunction, history of atrial fibrillation on chronic anticoagulation with warfarin, strip hypertension, admitted to the medicine service on 03/04/2016 when he presented with complaints of generalized weakness. Mr Maryland reporting that he has had a progressive decline over the past 6 months having increasing generalized weakness, difficulties performing activities of daily living. He states that 3 months ago began using a walker up until about a week ago when he had a fall at home. Since his fall he has been unable to ambulate.  Clinical Impression  Pt admitted as above and presenting with functional mobility limitations 2* generalized weakness, morbid obesity, lethargy and pain with any attempt at movement.  Pt will require follow up rehab at SNF level.    Follow Up Recommendations SNF    Equipment Recommendations  None recommended by PT    Recommendations for Other Services OT consult     Precautions / Restrictions Precautions Precautions: Fall Restrictions Weight Bearing Restrictions: No      Mobility  Bed Mobility Overal bed mobility: Needs Assistance             General bed mobility comments: unable to perform due to pain  Transfers                 General transfer comment: NT - Pt experiencing pain with all attempts to move  Ambulation/Gait                Stairs            Wheelchair Mobility    Modified Rankin (Stroke Patients Only)       Balance                                             Pertinent Vitals/Pain Pain Assessment: Faces Pain Score:  10-Worst pain ever Faces Pain Scale: Hurts worst Pain Location: Pain in all 4 limbs with attempts to move Pain Descriptors / Indicators: Sore;Sharp Pain Intervention(s): Limited activity within patient's tolerance;Patient requesting pain meds-RN notified    Home Living Family/patient expects to be discharged to:: Skilled nursing facility Living Arrangements: Other relatives (Disabled father)                    Prior Function Level of Independence: Independent with assistive device(s)         Comments: Uses RW or SPC for mobility; does all driving, grocery shopping, cooking and cleaning     Hand Dominance   Dominant Hand: Left    Extremity/Trunk Assessment   Upper Extremity Assessment: Defer to OT evaluation           Lower Extremity Assessment: RLE deficits/detail;LLE deficits/detail RLE Deficits / Details: Pt able to move toes but no other active movement noted.  Pt yells out in pain with any attempts to mobilize LE LLE Deficits / Details: Pt able to move toes but no other active movement noted.  Pt yells out in pain with any attempt to mobilize limb.     Communication   Communication: No difficulties  Cognition Arousal/Alertness: Lethargic Behavior During Therapy: Flat affect Overall  Cognitive Status: No family/caregiver present to determine baseline cognitive functioning                      General Comments      Exercises        Assessment/Plan    PT Assessment Patient needs continued PT services  PT Diagnosis Difficulty walking;Generalized weakness;Acute pain   PT Problem List Decreased strength;Decreased range of motion;Decreased activity tolerance;Decreased mobility;Decreased balance;Decreased knowledge of use of DME;Obesity;Pain  PT Treatment Interventions DME instruction;Gait training;Functional mobility training;Therapeutic activities;Therapeutic exercise;Patient/family education   PT Goals (Current goals can be found in the Care  Plan section) Acute Rehab PT Goals Patient Stated Goal: did not state PT Goal Formulation: Patient unable to participate in goal setting Time For Goal Achievement: 03/21/16 Potential to Achieve Goals: Fair    Frequency Min 3X/week   Barriers to discharge        Co-evaluation PT/OT/SLP Co-Evaluation/Treatment: Yes Reason for Co-Treatment: For patient/therapist safety;Complexity of the patient's impairments (multi-system involvement) PT goals addressed during session: Mobility/safety with mobility OT goals addressed during session: Strengthening/ROM       End of Session   Activity Tolerance: Patient limited by pain;Patient limited by lethargy Patient left: in bed;with call bell/phone within reach Nurse Communication: Mobility status;Need for lift equipment;Patient requests pain meds         Time: 1010-1024 PT Time Calculation (min) (ACUTE ONLY): 14 min   Charges:   PT Evaluation $PT Eval Moderate Complexity: 1 Procedure     PT G Codes:        Trinetta Alemu 2016-03-13, 12:55 PM

## 2016-03-08 NOTE — Evaluation (Signed)
Occupational Therapy Evaluation Patient Details Name: [name] MRN: [id number] DOB: 1945-11-27 Today's Date: 03/08/2016    History of Present Illness [name] is a 70 year old gentleman with multiple comorbidities including diastolic congestive heart failure, transthoracic echocardiogram performed on 02/07/2015 that revealed an EF of 55-60% with grade 3 diastolic dysfunction, history of atrial fibrillation on chronic anticoagulation with warfarin, strip hypertension, admitted to the medicine service on 03/04/2016 when he presented with complaints of generalized weakness. [name] reporting that he has had a progressive decline over the past 6 months having increasing generalized weakness, difficulties performing activities of daily living. He states that 3 months ago began using a walker up until about a week ago when he had a fall at home. Since his fall he has been unable to ambulate.   Clinical Impression   Pt admitted with FTT. Pt currently with functional limitations due to the deficits listed below (see OT Problem List).  Pt will benefit from skilled OT to increase their safety and independence with ADL and functional mobility for ADL to facilitate discharge to venue listed below.      Follow Up Recommendations  SNF    Equipment Recommendations  None recommended by OT       Precautions / Restrictions Restrictions Weight Bearing Restrictions: No      Mobility Bed Mobility Overal bed mobility: Needs Assistance             General bed mobility comments: unable to perform due to pain  Transfers                 General transfer comment: NT         ADL Overall ADL's : Needs assistance/impaired                                       General ADL Comments: Pt overall dependent.  Pt not able to feed self or groom self at this time due to pain and no AROM in BUE.               Pertinent Vitals/Pain Pain Assessment: Faces Pain Score:  10-Worst pain ever Faces Pain Scale: Hurts worst Pain Location: pain in hands and arms with attempted ROM Pain Descriptors / Indicators: Sharp;Sore Pain Intervention(s): Limited activity within patient's tolerance;Patient requesting pain meds-RN notified     Hand Dominance     Extremity/Trunk Assessment Upper Extremity Assessment Upper Extremity Assessment:  (PROM WFL.  OT did not observe any AROM. Pt grimaced with all PROM)           Communication     Cognition Arousal/Alertness: Lethargic Behavior During Therapy: Flat affect Overall Cognitive Status: No family/caregiver present to determine baseline cognitive functioning                     General Comments   Limited eval due to patients pain            Home Living Family/patient expects to be discharged to:: Skilled nursing facility                                             OT Diagnosis: Generalized weakness;Acute pain   OT Problem List: Decreased strength;Pain   OT Treatment/Interventions: Self-care/ADL training;Patient/family education    OT  Goals(Current goals can be found in the care plan section) Acute Rehab OT Goals Patient Stated Goal: did not state OT Goal Formulation: With patient Time For Goal Achievement: 03/21/16 ADL Goals Pt Will Perform Eating: with min assist;sitting Pt Will Perform Grooming: with min assist;sitting  OT Frequency: Min 2X/week   Barriers to D/C: Decreased caregiver support          Co-evaluation PT/OT/SLP Co-Evaluation/Treatment: Yes Reason for Co-Treatment: Complexity of the patient's impairments (multi-system involvement);For patient/therapist safety   OT goals addressed during session: Strengthening/ROM      End of Session Nurse Communication: Patient requests pain meds  Activity Tolerance: Patient limited by pain Patient left: in bed;with call bell/phone within reach;with nursing/sitter in room   Time: 1005-1019 OT Time Calculation  (min): 14 min Charges:  OT General Charges $OT Visit: 1 Procedure OT Evaluation $OT Eval Moderate Complexity: 1 Procedure G-Codes:    Alba Cory 04-05-2016, 12:16 PM

## 2016-03-09 DIAGNOSIS — M109 Gout, unspecified: Secondary | ICD-10-CM

## 2016-03-09 DIAGNOSIS — I2609 Other pulmonary embolism with acute cor pulmonale: Secondary | ICD-10-CM

## 2016-03-09 DIAGNOSIS — M10042 Idiopathic gout, left hand: Secondary | ICD-10-CM

## 2016-03-09 LAB — BASIC METABOLIC PANEL
Anion gap: 3 — ABNORMAL LOW (ref 5–15)
BUN: 47 mg/dL — ABNORMAL HIGH (ref 6–20)
CHLORIDE: 112 mmol/L — AB (ref 101–111)
CO2: 26 mmol/L (ref 22–32)
Calcium: 8.4 mg/dL — ABNORMAL LOW (ref 8.9–10.3)
Creatinine, Ser: 1.31 mg/dL — ABNORMAL HIGH (ref 0.61–1.24)
GFR calc non Af Amer: 54 mL/min — ABNORMAL LOW (ref >60)
Glucose, Bld: 113 mg/dL — ABNORMAL HIGH (ref 65–99)
POTASSIUM: 4.8 mmol/L (ref 3.5–5.1)
SODIUM: 141 mmol/L (ref 135–145)

## 2016-03-09 LAB — PROTIME-INR
INR: 4.22 — ABNORMAL HIGH (ref 0.00–1.49)
Prothrombin Time: 39.5 seconds — ABNORMAL HIGH (ref 11.6–15.2)

## 2016-03-09 LAB — CBC
HEMATOCRIT: 29.9 % — AB (ref 39.0–52.0)
Hemoglobin: 9.6 g/dL — ABNORMAL LOW (ref 13.0–17.0)
MCH: 33.3 pg (ref 26.0–34.0)
MCHC: 32.1 g/dL (ref 30.0–36.0)
MCV: 103.8 fL — AB (ref 78.0–100.0)
PLATELETS: 158 10*3/uL (ref 150–400)
RBC: 2.88 MIL/uL — AB (ref 4.22–5.81)
RDW: 15.8 % — ABNORMAL HIGH (ref 11.5–15.5)
WBC: 9.2 10*3/uL (ref 4.0–10.5)

## 2016-03-09 MED ORDER — COLCHICINE 0.6 MG PO TABS
0.6000 mg | ORAL_TABLET | Freq: Every day | ORAL | Status: DC
  Administered 2016-03-09 – 2016-03-11 (×3): 0.6 mg via ORAL
  Filled 2016-03-09 (×3): qty 1

## 2016-03-09 MED ORDER — METHYLPREDNISOLONE SODIUM SUCC 125 MG IJ SOLR
60.0000 mg | Freq: Once | INTRAMUSCULAR | Status: AC
  Administered 2016-03-09: 60 mg via INTRAVENOUS
  Filled 2016-03-09: qty 2

## 2016-03-09 MED ORDER — VITAMINS A & D EX OINT
TOPICAL_OINTMENT | CUTANEOUS | Status: AC
Start: 2016-03-09 — End: 2016-03-09
  Administered 2016-03-09: 1
  Filled 2016-03-09: qty 5

## 2016-03-09 MED ORDER — POTASSIUM CHLORIDE CRYS ER 20 MEQ PO TBCR
40.0000 meq | EXTENDED_RELEASE_TABLET | Freq: Once | ORAL | Status: AC
  Administered 2016-03-09: 40 meq via ORAL
  Filled 2016-03-09: qty 2

## 2016-03-09 MED ORDER — FUROSEMIDE 10 MG/ML IJ SOLN
60.0000 mg | Freq: Four times a day (QID) | INTRAMUSCULAR | Status: AC
  Administered 2016-03-09 (×2): 60 mg via INTRAVENOUS
  Filled 2016-03-09 (×2): qty 6

## 2016-03-09 NOTE — Progress Notes (Signed)
PROGRESS NOTE    [name]  ZOX:[id number] DOB: 1946-03-12 DOA: 03/04/2016 PCP: [name], MD   Brief Narrative: [name] is a 70 year old gentleman with multiple comorbidities including diastolic congestive heart failure, transthoracic echocardiogram performed on 02/07/2015 that revealed an EF of 55-60% with grade 3 diastolic dysfunction, history of atrial fibrillation on chronic anticoagulation with warfarin, strip hypertension, admitted to the medicine service on 03/04/2016 when he presented with complaints of generalized weakness. [name] reporting that he has had a progressive decline over the past 6 months having increasing generalized weakness, difficulties performing activities of daily living. He states that 3 months ago began using a walker up until about a week ago when he had a fall at home. Since his fall he has been unable to ambulate.   Assessment & Plan:   Principal Problem:   FTT (failure to thrive) in adult Active Problems:   Physical deconditioning   Essential hypertension   Chronic atrial fibrillation (HCC)   Anemia of chronic disease   Recurrent falls   Chronic anticoagulation   Diastolic dysfunction-grade 13 January 2015   Weakness of both lower extremities   Respiratory failure (HCC)   CAP (community acquired pneumonia)   1.  Community-acquired pneumonia -[name] presented with a steep functional decline, although it appears that he has had a progressive decline in the past 6 months. -Workup revealing the presence of a medial left lung base opacity consistent with pneumonia. -He was started on empiric IV antibiotic therapy with ceftriaxone and azithromycin -He was placed on pneumonia protocol -Repeat chest x-ray on 03/08/2016 showing pulmonary edema possibly persistent bibasilar airspace opacities. He remains on BiPAP.  -Blood cultures drawn on 03/06/2016 show no growth to date  2.  Acute hypercarbic respiratory failure. -ABG showed a pH of 7.25,  PCO2 of 57, having mental status changes. -ABG consistent with primary acute respiratory acidosis, uncompensated. Suspect secondary to community-acquired pneumonia and possibly obesity hypoventilation syndrome/obstructive sleep apnea -He was transferred to the step down unit and placed on BiPAP -On 02/29/2016 he was given Lasix 60 mg IV 2 doses. After having urinary output of 1.6 L, he has shown clinical improvement. Currently on a supplemental oxygen via nasal cannula  3.  Failure to thrive/functional decline. -[name] reporting a progressive functional decline over the past 6 months, stating that 3 months ago he began using a walker to get around his house then 1 week ago had a fall after which she has been unable to ambulate. -A chest x-ray performed on 03/05/2016 interpreted by radiology as having opacity at the medial left lung base consistent with an infiltrate, new compared to prior exam. -He was started on empiric IV antibiotic therapy with ceftriaxone and azithromycin on 03/05/2016. -Remains weak and severely deconditioned -Physical therapy recommending skilled nursing facility placement, will consult social work for SNF placement  4.  Chronic diastolic congestive heart failure/cor pulmonale -Last transthoracic echocardiogram was performed on 01/30/2015 that revealed preserved ejection fraction 55-60% with grade 3 diastolic dysfunction. -Transthoracic echocardiogram performed on 03/07/2016 showing severe LVH, ejection fraction 55-60%, right ventricle cavity size severely dilated. Likely has cor pulmonale.  -On 03/08/2016 he was given 60 mg IV Lasix 2 doses with subsequent urinary output of 1.6 L. Clinically showing improvement today as he was taken off of BiPAP.  5.  Hypotension. -He has a history of hypertension and had been on Benicar 40 mg by mouth daily, Carvedilol 6.25 mg by mouth twice a day, and Lasix 40 mg by  mouth twice a day. -Plan to titrate off of IV pressors today   6.   History of atrial fibrillation -CHADSVasc score of 4 -Remains rate controlled, has mentioned above holding beta blocker therapy due to hypotension. -INR trending up to 4.22 likely resulting from the administration of antibiotics  8.  History of alcohol abuse. -Family members reporting that he had been drinking beer on a daily basis. -Continue CIWA protocol for ETOH withdrawal.  9.  Suspected Acute Gout -Patient having significant tenderness and erythema involving MCP's  -He reports symptoms are similar to previous episodes of gout. -Will place him on colchicine  10.  Medical goals of care. -Patient stated that he would not want to undergo cardiopulmonary resuscitation in the event of arrest. Patient had been declining for the past 6 months. Overall prognosis is poor given significant  decline over the past year with multiple comorbidities. On 03/09/2016 he appears to be showing some improvement. Wean pressors off, see how he does over the next 24 hours. Will likely require SNF placement after this hospitalization.    DVT prophylaxis: Patient is fully anticoagulated  Code Status: DO NOT RESUSCITATE Family Communication: I called his brother Gene on 03/08/2016, updated him on his brother's condition.   Disposition Plan: He will remain in the SDU  Subjective:  Looks better today, more awake and alert responsive, off of BiPAP  Objective: Filed Vitals:   03/09/16 0630 03/09/16 0700 03/09/16 0759 03/09/16 0800  BP: 93/55 90/56  106/53  Pulse: 66 72  68  Temp:   98.7 F (37.1 C)   TempSrc:   Oral   Resp: Height:      Weight:      SpO2: 98% 98%  97%    Intake/Output Summary (Last 24 hours) at 03/09/16 0819 Last data filed at 03/09/16 0800  Gross per 24 hour  Intake 1829.89 ml  Output   1750 ml  Net  79.89 ml   Filed Weights   03/06/16 1414  Weight: 151.955 kg (335 lb)    Examination:  General exam: He is awake and alert, looks better today. Respiratory system:   Improved breath sounds, there is bibasilar fine crackles Cardiovascular system: S1 & S2 heard, RRR. No JVD, murmurs, rubs, gallops or clicks. No pedal edema. Gastrointestinal system: Abdomen is nondistended, soft and nontender. No organomegaly or masses felt. Normal bowel sounds heard. Central nervous system: He has 3-5 muscle strength to bilateral lower extremities Extremities:  He has lateral MCP tenderness with passive and active movement Skin: There are chronic venous stasis changes noted to bilateral lower extremities. Examined his back there is no evidence of decubitus ulcers. Psychiatry: he is awake and alert oriented 3.   Data Reviewed: I have personally reviewed following labs and imaging studies  CBC:  Recent Labs Lab 03/04/16 1635 03/05/16 0415 03/06/16 0413 03/07/16 0643 03/08/16 0317 03/09/16 0306  WBC 6.3 6.5 7.4 8.7 10.4 9.2  NEUTROABS 5.0  --   --   --   --   --   HGB 12.2* 11.6* 11.1* 10.8* 10.6* 9.6*  HCT 37.2* 36.4* 34.1* 34.2* 33.6* 29.9*  MCV 102.2* 106.7* 106.6* 106.9* 106.7* 103.8*  PLT 153 145* 135* 179 200 158   Basic Metabolic Panel:  Recent Labs Lab 03/05/16 0415 03/06/16 0413 03/07/16 0643 03/08/16 0317 03/09/16 0306  NA 143 142 142 143 141  K 5.9* 5.0 5.2* 5.2* 4.8  CL 115* 114* 113* 113* 112*  CO2 23 25  25 25 26   GLUCOSE 117* 129* 94 110* 113*  BUN 36* 39* 41* 44* 47*  CREATININE 1.10 1.21 1.24 1.14 1.31*  CALCIUM 8.7* 8.6* 8.7* 8.9 8.4*   GFR: Estimated Creatinine Clearance: 82 mL/min (by C-G formula based on Cr of 1.31). Liver Function Tests: No results for input(s): AST, ALT, ALKPHOS, BILITOT, PROT, ALBUMIN in the last 168 hours. No results for input(s): LIPASE, AMYLASE in the last 168 hours. No results for input(s): AMMONIA in the last 168 hours. Coagulation Profile:  Recent Labs Lab 03/05/16 0415 03/06/16 0413 03/07/16 0311 03/08/16 0317 03/09/16 0306  INR 3.19* 3.77* 3.56* 3.91* 4.22*   Cardiac Enzymes: No results  for input(s): CKTOTAL, CKMB, CKMBINDEX, TROPONINI in the last 168 hours. BNP (last 3 results) No results for input(s): PROBNP in the last 8760 hours. HbA1C: No results for input(s): HGBA1C in the last 72 hours. CBG:  Recent Labs Lab 03/05/16 2236  GLUCAP 132*   Lipid Profile: No results for input(s): CHOL, HDL, LDLCALC, TRIG, CHOLHDL, LDLDIRECT in the last 72 hours. Thyroid Function Tests:  Recent Labs  03/06/16 0849  TSH 0.968   Anemia Panel:  Recent Labs  03/06/16 0849  VITAMINB12 883  FOLATE 7.7   Sepsis Labs:  Recent Labs Lab 03/06/16 1320  LATICACIDVEN 1.0    Recent Results (from the past 240 hour(s))  MRSA PCR Screening     Status: Abnormal   Collection Time: 03/05/16  1:56 AM  Result Value Ref Range Status   MRSA by PCR POSITIVE (A) NEGATIVE Final    Comment:        The GeneXpert MRSA Assay (FDA approved for NASAL specimens only), is one component of a comprehensive MRSA colonization surveillance program. It is not intended to diagnose MRSA infection nor to guide or monitor treatment for MRSA infections. RESULT CALLED TO, READ BACK BY AND VERIFIED WITH: Lynelle Doctor RN @ [phone] ON 03/05/16 BY C DAVIS   Culture, blood (routine x 2)     Status: None (Preliminary result)   Collection Time: 03/06/16  1:20 PM  Result Value Ref Range Status   Specimen Description BLOOD RIGHT HAND  Final   Special Requests IN PEDIATRIC BOTTLE 4CC  Final   Culture   Final    NO GROWTH 2 DAYS Performed at [facility]    Report Status PENDING  Incomplete  Culture, blood (routine x 2)     Status: None (Preliminary result)   Collection Time: 03/06/16  1:42 PM  Result Value Ref Range Status   Specimen Description BLOOD RIGHT ARM  Final   Special Requests IN PEDIATRIC BOTTLE 2CC  Final   Culture   Final    NO GROWTH 2 DAYS Performed at [facility]    Report Status PENDING  Incomplete         Radiology Studies: Dg Chest 1 View  03/08/2016  CLINICAL  DATA:  70 year old male with shortness of breath EXAM: CHEST 1 VIEW COMPARISON:  Prior chest x-ray 03/06/2016 FINDINGS: Stable enlargement of the cardiopericardial silhouette. Slightly improved pulmonary edema. Persistent bibasilar airspace opacities and pulmonary vascular congestion. No significant increase in airspace opacity. No pneumothorax. No acute osseous abnormality. IMPRESSION: 1. Slightly improved pulmonary edema. 2. Persistent bibasilar airspace opacities which may reflect small layering effusions with atelectasis and/or infiltrate. Electronically Signed   By: Malachy Moan M.D.   On: 03/08/2016 09:35        Scheduled Meds: . allopurinol  100 mg Oral Daily  .  antiseptic oral rinse  7 mL Mouth Rinse q12n4p  . azithromycin  500 mg Intravenous Q24H  . cefTRIAXone (ROCEPHIN)  IV  1 g Intravenous Q24H  . chlorhexidine  15 mL Mouth Rinse BID  . colchicine  0.6 mg Oral Daily  . feeding supplement (ENSURE ENLIVE)  237 mL Oral BID BM  . ferrous sulfate  325 mg Oral Daily  . folic acid  1 mg Oral Daily  . LORazepam  0-4 mg Intravenous Q6H   Followed by  . [START ON 03/10/2016] LORazepam  0-4 mg Intravenous Q12H  . multivitamin with minerals  1 tablet Oral Daily  . mupirocin ointment  1 application Nasal BID  . pravastatin  10 mg Oral q1800  . sodium chloride flush  3 mL Intravenous Q12H  . thiamine  100 mg Oral Daily   Or  . thiamine  100 mg Intravenous Daily   Continuous Infusions: . sodium chloride 10 mL/hr at 03/08/16 1900  . phenylephrine (NEO-SYNEPHRINE) Adult infusion 20 mcg/min (03/09/16 0758)     LOS: 3 days    Time spent: 35 min    Jeralyn Bennett, MD [facility] Pager [phone]  If 7PM-7AM, please contact night-coverage [email or web address] Password [email or web address] 03/09/2016, 8:19 AM

## 2016-03-09 NOTE — Progress Notes (Signed)
Date:  Mar 09, 2016 Chart reviewed for concurrent status and case management needs. Will continue to follow patient for changes and needs:  Remains on iv pressors Expected discharge date: [id number] Marcelle Smiling, BSN, [place], [place]   [phone]

## 2016-03-09 NOTE — Progress Notes (Signed)
ANTICOAGULATION CONSULT NOTE -   Pharmacy Consult for Warfarin Indication: atrial fibrillation  No Known Allergies  Patient Measurements: Height: 6' (182.9 cm) Weight: (!) 335 lb (151.955 kg) IBW/kg (Calculated) : 77.6  Vital Signs: Temp: 98.7 F (37.1 C) (05/29 0759) Temp Source: Oral (05/29 0759) BP: 106/53 mmHg (05/29 0800) Pulse Rate: 68 (05/29 0800)  Labs:  Recent Labs  03/07/16 0311  03/07/16 0643 03/08/16 0317 03/09/16 0306  HGB  --   < > 10.8* 10.6* 9.6*  HCT  --   --  34.2* 33.6* 29.9*  PLT  --   --  179 200 158  LABPROT 34.8*  --   --  37.3* 39.5*  INR 3.56*  --   --  3.91* 4.22*  CREATININE  --   --  1.24 1.14 1.31*  < > = values in this interval not displayed. Estimated Creatinine Clearance: 82 mL/min (by C-G formula based on Cr of 1.31).  Assessment: 54 yoM to ED 5/24 with bilateral LE weakness and swelling, RLE pain s/p fall at home 3 days ago. No fx per Xray. Chronic Warfarin for Afib: Home dose 2.5mg  MWF, 5mg  other days,LD 5/23 (2000)  Admit INR 3.09  Home dose lower than expected for large body habitus  03/09/2016  INR 4.22 supratherapeutic and increasing despite no recent doses.  INR had been decreasing, but now rising again despite resumption of diet and good PO intake.  Likely d/t abx.  Also Hx CHF; no recent BNP but could have some degree of hepatic congestion.  CBC:  H/H slightly low, decreasing, Plt WNL  No bleeding or complications reported.  Drug-drug interactions: antibiotics  Diet: heart healthly   Goal of Therapy:  INR 2-3 Monitor platelets by anticoagulation protocol: Yes   Plan:   No Warfarin today  Daily Protime/INR  Monitor CBC, sign/sx bleed  Lynann Beaver PharmD, BCPS Pager [phone] 03/09/2016 11:12 AM

## 2016-03-09 NOTE — Progress Notes (Signed)
PULMONARY / CRITICAL CARE MEDICINE   Name: [name] MRN: [id number] DOB: August 14, 1946    ADMISSION DATE:  03/04/2016 CONSULTATION DATE:  5/26  REFERRING MD:  Vanessa Barbara   CHIEF COMPLAINT:  Acute hypercarbic respiratory failure and hypotension   BRIEF:   70 y/o male with multiple comorbid illnesses who developed acute hypercapneic respiratory failure in the setting of narcotic use and HCAP requiring BIPAP and vasopressors.  SUBJECTIVE:  Off vasopressors Complaining of significant joint pain and wrists consistent with gout  VITAL SIGNS: BP 94/46 mmHg  Pulse 73  Temp(Src) 98.6 F (37 C) (Oral)  Resp 18  Ht 6' (1.829 m)  Wt 151.955 kg (335 lb)  BMI 45.42 kg/m2  SpO2 97%  HEMODYNAMICS:    VENTILATOR SETTINGS: Vent Mode:  [-] SIMV/PC/PS FiO2 (%):  [30 %] 30 % Set Rate:  [12 bmp] 12 bmp PEEP:  [5 cmH20] 5 cmH20  INTAKE / OUTPUT: I/O last 3 completed shifts: In: 2204.9 [P.O.:840; I.V.:1064.9; IV Piggyback:300] Out: 1750 [Urine:1750]  PHYSICAL EXAMINATION: General:  Obese, chronically ill appearing male   Neuro:  Awakens to voice, generally weak HEENT: NCAT  CV: Irreg irreg, distant heart sounds Lungs:  Scant crackles bases, normal respiratory effort Abdomen: BS+, soft Musculoskeletal:  No bony abnormalities Skin:  Edema legs bilaterally  LABS:  BMET  Recent Labs Lab 03/07/16 0643 03/08/16 0317 03/09/16 0306  NA 142 143 141  K 5.2* 5.2* 4.8  CL 113* 113* 112*  CO2 BUN 41* 44* 47*  CREATININE 1.24 1.14 1.31*  GLUCOSE 94 110* 113*    Electrolytes  Recent Labs Lab 03/07/16 0643 03/08/16 0317 03/09/16 0306  CALCIUM 8.7* 8.9 8.4*    CBC  Recent Labs Lab 03/07/16 0643 03/08/16 0317 03/09/16 0306  WBC 8.7 10.4 9.2  HGB 10.8* 10.6* 9.6*  HCT 34.2* 33.6* 29.9*  PLT 179 200 158    Coag's  Recent Labs Lab 03/07/16 0311 03/08/16 0317 03/09/16 0306  INR 3.56* 3.91* 4.22*    Sepsis Markers  Recent Labs Lab 03/06/16 1320   LATICACIDVEN 1.0    ABG  Recent Labs Lab 03/06/16 1246 03/06/16 1530  PHART 7.253* 7.268*  PCO2ART 57.8* 53.7*  PO2ART 86.7 138*    Liver Enzymes No results for input(s): AST, ALT, ALKPHOS, BILITOT, ALBUMIN in the last 168 hours.  Cardiac Enzymes No results for input(s): TROPONINI, PROBNP in the last 168 hours.  Glucose  Recent Labs Lab 03/05/16 2236  GLUCAP 132*    Imaging 03/08/2016 chest x-ray images personally reviewed showing bi-basilar atelectasis, possible pulmonary edema and pleural effusions  STUDIES:  03/07/2016 echocardiogram showed normal LV size and function but significant dilation of the right ventricle and right atrium  CULTURES: BC 5/25>>>  ANTIBIOTICS: rocephin 5/24>>> azith 5/24>>>  SIGNIFICANT EVENTS:   LINES/TUBES:   DISCUSSION: This is a very chronically ill gentleman with morbid obesity and deconditioning here with CAP causing respiratory failure.  He appears to have decompensated cor pulmonale secondary to obesity hypoventilation syndrome. Echocardiogram on 03/07/2016 showed evidence of this. Considering his profound deconditioning and morbid obesity this will be a very difficult problem to treat. Overall poor prognosis. He has made some improvement with diuresis. Gout flare on 03/09/2016.    ASSESSMENT / PLAN:  PULMONARY A: Acute on chronic Hypercarbic Respiratory Failure in setting decompensated cor pulmonale Likely OSA/OHS Acute pulmonary edema: Improving DNR  P:   Non-invasive mechanical ventilation daily at bedtime Continue diuresis today Incentive spirometry Out of bed as  tolerated Hold sedation  CARDIOLOGY  A: Decompensated cor pulmonale  AFIB Chronic diastolic HF Hypotension: > not in septic shock, unclear etiology, cardiac? P:  Lasix 2 doses today Hold antihypertensives  ID: A: Cellulitis and CAP (NOS) P:   Cont abx (see above) for 7 day course   NEURO A: Acute Encephalopathy in setting of  hypercarbia > improving P:   Hold narcotics and sedating medicines  Global> overall prognosis. If he does not show improvement in the next 1-2 days with diuresis would consider palliative care involvement.  Gout: Give Solu-Medrol 1 dose today  FAMILY  - Updates: brother updated 5/26, none bedside on 5/29  Discussed with Dr. Norlene Duel, MD [place] [facility] Pager: [phone] Cell: [phone] After 3pm or if no response, call [phone]   03/09/2016, 12:50 PM

## 2016-03-09 NOTE — Progress Notes (Signed)
OT Cancellation Note  Patient Details Name: MOSIE SILBERBERG MRN: [id number] DOB: 10/11/1946   Cancelled Treatment:    Reason Eval/Treat Not Completed: Medical issues which prohibited therapy;Pain limiting ability to participate  Spoke with RN.  Pts BUe very painful and new med has been started.   Will check on pt tomorrow.   Alba Cory 03/09/2016, 11:54 AM

## 2016-03-10 DIAGNOSIS — M1000 Idiopathic gout, unspecified site: Secondary | ICD-10-CM

## 2016-03-10 LAB — BASIC METABOLIC PANEL
ANION GAP: 5 (ref 5–15)
BUN: 54 mg/dL — ABNORMAL HIGH (ref 6–20)
CHLORIDE: 106 mmol/L (ref 101–111)
CO2: 25 mmol/L (ref 22–32)
Calcium: 8.1 mg/dL — ABNORMAL LOW (ref 8.9–10.3)
Creatinine, Ser: 1.29 mg/dL — ABNORMAL HIGH (ref 0.61–1.24)
GFR, EST NON AFRICAN AMERICAN: 55 mL/min — AB (ref >60)
Glucose, Bld: 163 mg/dL — ABNORMAL HIGH (ref 65–99)
POTASSIUM: 4.8 mmol/L (ref 3.5–5.1)
SODIUM: 136 mmol/L (ref 135–145)

## 2016-03-10 LAB — PROTIME-INR
INR: 2.73 — AB (ref 0.00–1.49)
Prothrombin Time: 28.5 seconds — ABNORMAL HIGH (ref 11.6–15.2)

## 2016-03-10 LAB — CBC
HEMATOCRIT: 30.2 % — AB (ref 39.0–52.0)
HEMOGLOBIN: 9.7 g/dL — AB (ref 13.0–17.0)
MCH: 33.6 pg (ref 26.0–34.0)
MCHC: 32.1 g/dL (ref 30.0–36.0)
MCV: 104.5 fL — AB (ref 78.0–100.0)
Platelets: 160 10*3/uL (ref 150–400)
RBC: 2.89 MIL/uL — AB (ref 4.22–5.81)
RDW: 15.6 % — ABNORMAL HIGH (ref 11.5–15.5)
WBC: 6.3 10*3/uL (ref 4.0–10.5)

## 2016-03-10 MED ORDER — WARFARIN 0.5 MG HALF TABLET
0.5000 mg | ORAL_TABLET | Freq: Once | ORAL | Status: AC
  Administered 2016-03-10: 0.5 mg via ORAL
  Filled 2016-03-10: qty 1

## 2016-03-10 MED ORDER — WARFARIN - PHARMACIST DOSING INPATIENT
Freq: Every day | Status: DC
Start: 2016-03-10 — End: 2016-03-16

## 2016-03-10 MED ORDER — FUROSEMIDE 40 MG PO TABS
40.0000 mg | ORAL_TABLET | Freq: Two times a day (BID) | ORAL | Status: DC
  Administered 2016-03-10 – 2016-03-13 (×8): 40 mg via ORAL
  Filled 2016-03-10 (×8): qty 1

## 2016-03-10 NOTE — Progress Notes (Signed)
Occupational Therapy Treatment Patient Details Name: [name] MRN: [id number] DOB: 11/07/1945 Today's Date: 03/10/2016    History of present illness [name] is a 70 year old gentleman with multiple comorbidities including diastolic congestive heart failure, transthoracic echocardiogram performed on 02/07/2015 that revealed an EF of 55-60% with grade 3 diastolic dysfunction, history of atrial fibrillation on chronic anticoagulation with warfarin, strip hypertension, admitted to the medicine service on 03/04/2016 when he presented with complaints of generalized weakness. [name] reporting that he has had a progressive decline over the past 6 months having increasing generalized weakness, difficulties performing activities of daily living. He states that 3 months ago began using a walker up until about a week ago when he had a fall at home. Since his fall he has been unable to ambulate.   OT comments  OT did tolerate BUE PROM this day.  OT also positioned BUe on pillows to help with edema. VERY limited active movement noted in BUE.            Precautions / Restrictions Precautions Precautions: Fall Precaution Comments: gout pain Restrictions Weight Bearing Restrictions: No       Mobility Bed Mobility               General bed mobility comments: NT  Transfers                 General transfer comment: NT        ADL   Eating/Feeding: Bed level;Total assistance   Grooming: Total assistance;Bed level                                 General ADL Comments: total A for activity at this time due to overall weakness and pain in BUE                Cognition   Behavior During Therapy: [facility] for tasks assessed/performed Overall Cognitive Status: Within Functional Limits for tasks assessed                         Exercises Shoulder Exercises Shoulder Flexion: PROM;Both;10 reps Elbow Flexion: PROM;Both;10 reps Wrist Flexion:  PROM;Both;10 reps Wrist Extension: PROM;10 reps;Both Digit Composite Flexion: PROM;Both;10 reps Composite Extension: PROM;10 reps;Both   Shoulder Instructions       General Comments Keep BUE elevated on 2 pillows and try to move fingers as able as well as BUE    Pertinent Vitals/ Pain       Pain Score: 5  Pain Location: BUE with PROM Pain Descriptors / Indicators: Sore Pain Intervention(s): Monitored during session;Repositioned;Patient requesting pain meds-RN notified            Progress Toward Goals  OT Goals(current goals can now be found in the care plan section)  Progress towards OT goals: Progressing toward goals     Plan Discharge plan remains appropriate    Co-evaluation                 End of Session     Activity Tolerance Patient limited by pain   Patient Left in bed;with call bell/phone within reach;with bed alarm set   Nurse Communication Patient requests pain meds        Time: 1135-1153 OT Time Calculation (min): 18 min  Charges: OT General Charges $OT Visit: 1 Procedure OT Treatments $Therapeutic Exercise: 8-22 mins  [name], Karin Golden D 03/10/2016, 12:13 PM

## 2016-03-10 NOTE — Progress Notes (Signed)
ANTICOAGULATION CONSULT NOTE -   Pharmacy Consult for Warfarin Indication: atrial fibrillation  No Known Allergies  Patient Measurements: Height: 6' (182.9 cm) Weight: (!) 335 lb (151.955 kg) IBW/kg (Calculated) : 77.6  Vital Signs: Temp: 98.1 F (36.7 C) (05/30 0400) Temp Source: Axillary (05/30 0400) BP: 96/61 mmHg (05/30 0529) Pulse Rate: 67 (05/30 0529)  Labs:  Recent Labs  03/08/16 0317 03/09/16 0306 03/10/16 0328  HGB 10.6* 9.6* 9.7*  HCT 33.6* 29.9* 30.2*  PLT 200 158 160  LABPROT 37.3* 39.5* 28.5*  INR 3.91* 4.22* 2.73*  CREATININE 1.14 1.31* 1.29*   Estimated Creatinine Clearance: 83.3 mL/min (by C-G formula based on Cr of 1.29).  Assessment: 7 yoM to ED 5/24 with bilateral LE weakness and swelling, RLE pain s/p fall at home 3 days ago. No fx per Xray. Chronic Warfarin for Afib: Home dose 2.5mg  MWF, 5mg  other days, LD 5/23 (2000)  Admit INR 3.09  Home dose lower than expected for large body habitus  03/10/2016  INR 2.73, has decreased into therapeutic range after holding doses since admission (LD 5/23).    CBC:  H/H low, decreasing, Plt WNL  No bleeding or complications reported.  Drug-drug interactions: antibiotics  Diet: heart healthly  Goal of Therapy:  INR 2-3 Monitor platelets by anticoagulation protocol: Yes   Plan:   Resume warfarin cautiously today with 0.5 mg dose x 1 this evening.  Daily Protime/INR  Monitor CBC, sign/sx bleed  Clance Boll, PharmD, BCPS Pager: [phone] 03/10/2016 8:19 AM

## 2016-03-10 NOTE — Clinical Social Work Placement (Signed)
   CLINICAL SOCIAL WORK PLACEMENT  NOTE  Date:  03/10/2016  Patient Details  Name: [name] MRN: [id number] Date of Birth: [birth year]  Clinical Social Work is seeking post-discharge placement for this patient at the Skilled  Nursing Facility level of care (*CSW will initial, date and re-position this form in  chart as items are completed):  Yes   Patient/family provided with [facility] Clinical Social Work Department's list of facilities offering this level of care within the geographic area requested by the patient (or if unable, by the patient's family).  Yes   Patient/family informed of their freedom to choose among providers that offer the needed level of care, that participate in Medicare, Medicaid or managed care program needed by the patient, have an available bed and are willing to accept the patient.  Yes   Patient/family informed of [facility]'s ownership interest in [facility] and [facility], as well as of the fact that they are under no obligation to receive care at these facilities.  PASRR submitted to EDS on       PASRR number received on       Existing PASRR number confirmed on 03/10/16     FL2 transmitted to all facilities in geographic area requested by pt/family on 03/10/16     FL2 transmitted to all facilities within larger geographic area on       Patient informed that his/her managed care company has contracts with or will negotiate with certain facilities, including the following:        Yes   Patient/family informed of bed offers received.  Patient chooses bed at [facility]     Physician recommends and patient chooses bed at      Patient to be transferred to   on  .  Patient to be transferred to facility by       Patient family notified on   of transfer.  Name of family member notified:        PHYSICIAN       Additional Comment:    _______________________________________________ Royetta Asal, LCSW   [phone] 03/10/2016, 1:42 PM

## 2016-03-10 NOTE — Progress Notes (Signed)
PROGRESS NOTE    [name]  ZOX:[id number] DOB: [birth year] DOA: 03/04/2016 PCP: [name], MD   Brief Narrative: [name] is a 70 year old gentleman with multiple comorbidities including diastolic congestive heart failure, transthoracic echocardiogram performed on 02/07/2015 that revealed an EF of 55-60% with grade 3 diastolic dysfunction, history of atrial fibrillation on chronic anticoagulation with warfarin, hypertension, admitted to the medicine service on 03/04/2016 when he presented with complaints of generalized weakness. [name] reporting that he has had a progressive decline over the past 6 months having increasing generalized weakness, difficulties performing activities of daily living. He states that 3 months ago began using a walker up until about a week ago when he had a fall at home. Since his fall he has been unable to ambulate. Workup during this hospitalization included a chest x-ray that showed left lung base opacity consistent with pneumonia. He was started on empiric IV antibiotic therapy with ceftriaxone and azithromycin. Hospitalization was complicated by the development of acute hypercarbic respiratory failure and mental status changes for which she was transferred to the step down unit. An ABG revealed a pH of 7.25 with PCO2 of 57. Suspect secondary to OSA exacerbation. Pulmonary critical care medicine was consulted. He required IV pressors support which has been weaned off now. He eventually got better with IV Lasix. On 03/10/2016 patient showing market clinical improvement, now off of BiPAP.    Assessment & Plan:   Principal Problem:   FTT (failure to thrive) in adult Active Problems:   Physical deconditioning   Essential hypertension   Chronic atrial fibrillation (HCC)   Anemia of chronic disease   Recurrent falls   Chronic anticoagulation   Diastolic dysfunction-grade 13 January 2015   Weakness of both lower extremities   Respiratory failure (HCC)   CAP  (community acquired pneumonia)   Acute gout   Cor, pulmonale, acute (HCC)   1.  Community-acquired pneumonia -[name] presented with a steep functional decline, although it appears that he has had a progressive decline in the past 6 months. -Workup revealing the presence of a medial left lung base opacity consistent with pneumonia. -He was started on empiric IV antibiotic therapy with ceftriaxone and azithromycin -He was placed on pneumonia protocol -Repeat chest x-ray on 03/08/2016 showing pulmonary edema possibly persistent bibasilar airspace opacities. He remains on BiPAP.  -Blood cultures drawn on 03/06/2016 show [name] growth to date  2.  Acute hypercarbic respiratory failure. -ABG showed a pH of 7.25, PCO2 of 57, having mental status changes. -ABG consistent with primary acute respiratory acidosis, uncompensated. Suspect secondary to community-acquired pneumonia and possibly obesity hypoventilation syndrome/obstructive sleep apnea -He was transferred to the step down unit and placed on BiPAP -On 02/29/2016 he was given Lasix 60 mg IV 2 doses.  -On 03/10/2016 he had a urinary output of 1.9 L over the past 42 hours. -Showing clinical improvement, off of BiPAP. Mental status changes resolved. Pressors support weaned off.    3.  Failure to thrive/functional decline. -[name] reporting a progressive functional decline over the past 6 months, stating that 3 months ago he began using a walker to get around his house then 1 week ago had a fall after which she has been unable to ambulate. -A chest x-ray performed on 03/05/2016 interpreted by radiology as having opacity at the medial left lung base consistent with an infiltrate, new compared to prior exam. -He was started on empiric IV antibiotic therapy with ceftriaxone and azithromycin on 03/05/2016. -Remains weak and  severely deconditioned -Physical therapy recommending skilled nursing facility placement -Will need skilled nursing facility  placement after this hospitalization  4.  Chronic diastolic congestive heart failure/cor pulmonale -Last transthoracic echocardiogram was performed on 01/30/2015 that revealed preserved ejection fraction 55-60% with grade 3 diastolic dysfunction. -Transthoracic echocardiogram performed on 03/07/2016 showing severe LVH, ejection fraction 55-60%, right ventricle cavity size severely dilated. Likely has cor pulmonale.  -He showed significant clinical improvement with demonstration of IV Lasix -On 03/10/2016 he was placed back on his home regimen with Lasix 40 mg by mouth twice a day.  5.  Hypotension. -He has a history of hypertension and had been on Benicar 40 mg by mouth daily, Carvedilol 6.25 mg by mouth twice a day, and Lasix 40 mg by mouth twice a day. -He was titrated off of IV pressors  6.  History of atrial fibrillation -CHADSVasc score of 4 -Remains rate controlled, has mentioned above holding beta blocker therapy due to hypotension. -INR trending up to 4.22 likely resulting from the administration of antibiotics -Pharmacy consulted for warfarin management.  8.  History of alcohol abuse. -Family members reporting that he had been drinking beer on a daily basis. -Continue CIWA protocol for ETOH withdrawal.  9.  Suspected Acute Gout -Patient having significant tenderness and erythema involving MCP's  -He reports symptoms are similar to previous episodes of gout. -Placed him on colchicine  10.  Medical goals of care. -Patient stated that he would not want to undergo cardiopulmonary resuscitation in the event of arrest. Patient had been declining for the past 6 months. Overall prognosis is poor given significant  decline over the past year with multiple comorbidities. On 03/09/2016 he appears to be showing some improvement. Weaned pressors off. On 03/10/2016 showing marked clinical improvement Will likely require SNF placement after this hospitalization.    DVT prophylaxis: Patient  is fully anticoagulated  Code Status: DO NOT RESUSCITATE Family Communication: I called his brother [name], updated him on patient's condition Disposition Plan: He will remain in the SDU, anticipate discharge to skilled nursing facility  Subjective:  He looks much better today, awake and alert, wanting to eat. States gout pain is improved  Objective: Filed Vitals:   03/10/16 0529 03/10/16 0600 03/10/16 0733 03/10/16 0800  BP: 96/61 113/59  112/61  Pulse: 67 60  59  Temp:    97.9 F (36.6 C)  TempSrc:    Axillary  Resp: 15 7  23   Height:      Weight:      SpO2: 95% 95% 95% 98%    Intake/Output Summary (Last 24 hours) at 03/10/16 0856 Last data filed at 03/10/16 0835  Gross per 24 hour  Intake   1370 ml  Output   1910 ml  Net   -540 ml   Filed Weights   03/06/16 1414  Weight: 151.955 kg (335 lb)    Examination:  General exam: He is awake and alert, [name] acute distress, interactive. Overall some improvement Respiratory system:  Improved breath sounds, there is bibasilar fine crackles Cardiovascular system: S1 & S2 heard, RRR. [name] JVD, murmurs, rubs, gallops or clicks. [name] pedal edema. Gastrointestinal system: Abdomen is nondistended, soft and nontender. [name] organomegaly or masses felt. Normal bowel sounds heard. Central nervous system: He has 3-5 muscle strength to bilateral lower extremities Extremities:  He has lateral MCP tenderness with passive and active movement Skin: There are chronic venous stasis changes noted to bilateral lower extremities. Examined his back there is [name] evidence of decubitus ulcers.  Psychiatry: he is awake and alert oriented 3.   Data Reviewed: I have personally reviewed following labs and imaging studies  CBC:  Recent Labs Lab 03/04/16 1635  03/06/16 0413 03/07/16 0643 03/08/16 0317 03/09/16 0306 03/10/16 0328  WBC 6.3  < > 7.4 8.7 10.4 9.2 6.3  NEUTROABS 5.0  --   --   --   --   --   --   HGB 12.2*  < > 11.1* 10.8* 10.6* 9.6* 9.7*  HCT  37.2*  < > 34.1* 34.2* 33.6* 29.9* 30.2*  MCV 102.2*  < > 106.6* 106.9* 106.7* 103.8* 104.5*  PLT 153  < > 135* 179 200 158 160  < > = values in this interval not displayed. Basic Metabolic Panel:  Recent Labs Lab 03/06/16 0413 03/07/16 0643 03/08/16 0317 03/09/16 0306 03/10/16 0328  NA 142 142 143 141 136  K 5.0 5.2* 5.2* 4.8 4.8  CL 114* 113* 113* 112* 106  CO2 25 25 25 26 25   GLUCOSE 129* 94 110* 113* 163*  BUN 39* 41* 44* 47* 54*  CREATININE 1.21 1.24 1.14 1.31* 1.29*  CALCIUM 8.6* 8.7* 8.9 8.4* 8.1*   GFR: Estimated Creatinine Clearance: 83.3 mL/min (by C-G formula based on Cr of 1.29). Liver Function Tests: [name] results for input(s): AST, ALT, ALKPHOS, BILITOT, PROT, ALBUMIN in the last 168 hours. [name] results for input(s): LIPASE, AMYLASE in the last 168 hours. [name] results for input(s): AMMONIA in the last 168 hours. Coagulation Profile:  Recent Labs Lab 03/06/16 0413 03/07/16 0311 03/08/16 0317 03/09/16 0306 03/10/16 0328  INR 3.77* 3.56* 3.91* 4.22* 2.73*   Cardiac Enzymes: [name] results for input(s): CKTOTAL, CKMB, CKMBINDEX, TROPONINI in the last 168 hours. BNP (last 3 results) [name] results for input(s): PROBNP in the last 8760 hours. HbA1C: [name] results for input(s): HGBA1C in the last 72 hours. CBG:  Recent Labs Lab 03/05/16 2236  GLUCAP 132*   Lipid Profile: [name] results for input(s): CHOL, HDL, LDLCALC, TRIG, CHOLHDL, LDLDIRECT in the last 72 hours. Thyroid Function Tests: [name] results for input(s): TSH, T4TOTAL, FREET4, T3FREE, THYROIDAB in the last 72 hours. Anemia Panel: [name] results for input(s): VITAMINB12, FOLATE, FERRITIN, TIBC, IRON, RETICCTPCT in the last 72 hours. Sepsis Labs:  Recent Labs Lab 03/06/16 1320  LATICACIDVEN 1.0    Recent Results (from the past 240 hour(s))  MRSA PCR Screening     Status: Abnormal   Collection Time: 03/05/16  1:56 AM  Result Value Ref Range Status   MRSA by PCR POSITIVE (A) NEGATIVE Final    Comment:          The GeneXpert MRSA Assay (FDA approved for NASAL specimens only), is one component of a comprehensive MRSA colonization surveillance program. It is not intended to diagnose MRSA infection nor to guide or monitor treatment for MRSA infections. RESULT CALLED TO, READ BACK BY AND VERIFIED WITH: Lynelle Doctor RN @ [phone] ON 03/05/16 BY C DAVIS   Culture, blood (routine x 2)     Status: None (Preliminary result)   Collection Time: 03/06/16  1:20 PM  Result Value Ref Range Status   Specimen Description BLOOD RIGHT HAND  Final   Special Requests IN PEDIATRIC BOTTLE 4CC  Final   Culture   Final    [name] GROWTH 3 DAYS Performed at [facility]    Report Status PENDING  Incomplete  Culture, blood (routine x 2)     Status: None (Preliminary result)   Collection Time: 03/06/16  1:42 PM  Result Value Ref Range Status   Specimen Description BLOOD RIGHT ARM  Final   Special Requests IN PEDIATRIC BOTTLE 2CC  Final   Culture   Final    [name] GROWTH 3 DAYS Performed at [facility]    Report Status PENDING  Incomplete         Radiology Studies: [name] results found.      Scheduled Meds: . allopurinol  100 mg Oral Daily  . antiseptic oral rinse  7 mL Mouth Rinse q12n4p  . azithromycin  500 mg Intravenous Q24H  . cefTRIAXone (ROCEPHIN)  IV  1 g Intravenous Q24H  . chlorhexidine  15 mL Mouth Rinse BID  . colchicine  0.6 mg Oral Daily  . feeding supplement (ENSURE ENLIVE)  237 mL Oral BID BM  . ferrous sulfate  325 mg Oral Daily  . folic acid  1 mg Oral Daily  . LORazepam  0-4 mg Intravenous Q12H  . multivitamin with minerals  1 tablet Oral Daily  . pravastatin  10 mg Oral q1800  . sodium chloride flush  3 mL Intravenous Q12H  . thiamine  100 mg Oral Daily   Or  . thiamine  100 mg Intravenous Daily  . warfarin  0.5 mg Oral ONCE-1800  . Warfarin - Pharmacist Dosing Inpatient   Does not apply q1800   Continuous Infusions: . sodium chloride 10 mL/hr at 03/08/16 1900  .  phenylephrine (NEO-SYNEPHRINE) Adult infusion Stopped (03/09/16 0818)     LOS: 4 days    Time spent: 35 min    Jeralyn Bennett, MD [facility] Pager [phone]  If 7PM-7AM, please contact night-coverage [email or web address] Password [email or web address] 03/10/2016, 8:56 AM

## 2016-03-10 NOTE — Progress Notes (Signed)
Pt has chosen [facility] for rehab placement at d/c. SNF contacted and will admit pending bed availability at time of d/c. Pt has other bed offers in case [facility] is unable to assist. Pt has [employer] which requires prior authorization for SNF placement.  CSW will assist with authorization process once current PT note is available. Pt remains in SDU. He is not stable for d/c at this time. Pt's brother has been updated and agrees with d/c plan.  Cori Razor LCSW [phone]

## 2016-03-10 NOTE — Clinical Social Work Note (Signed)
Clinical Social Work Assessment  Patient Details  Name: [name] MRN: [id number] Date of Birth: 08/11/1946  Date of referral:  03/10/16               Reason for consult:  Facility Placement, Discharge Planning                Permission sought to share information with:  Facility Contact Representative Permission granted to share information::  Yes, Verbal Permission Granted  Name::        Agency::     Relationship::     Contact Information:     Housing/Transportation Living arrangements for the past 2 months:  Single Family Home Source of Information:  Patient Patient Interpreter Needed:  None Criminal Activity/Legal Involvement Pertinent to Current Situation/Hospitalization:    Significant Relationships:  Siblings, Parents Lives with:  Parents Do you feel safe going back to the place where you live?  No (SNF recommended.) Need for family participation in patient care:     Care giving concerns:  Pt's care cannot be managed at home following hospital d/c.   Social Worker assessment / plan:  Pt hospitalized on 03/04/16 with multiple medical concerns including : CAP, Acute respiratory failure, and failure to thrive. PT has recommended SNF placement at d/c. CSW met with pt to review PT recommendations. Pt reports that he was in rehab a few months ago and found it helpful. Pt gave CSW permission to initiate SNF search and update his brother, [name] [phone]. CSW will continue to follow to assist with d/c planning needs.  Employment status:  Retired Insurance information:  Managed Medicare PT Recommendations:  Skilled Nursing Facility Information / Referral to community resources:  Skilled Nursing Facility  Patient/Family's Response to care:  Pt feels SNF placement is needed.  Patient/Family's Understanding of and Emotional Response to Diagnosis, Current Treatment, and Prognosis:  Pt is aware of his medical status. " I'm feeling a little better. " Pt reports that he has  been to rehab in the past. " I'd like to go  to [facility] . " Emotional support and encouragement provided.  Emotional Assessment Appearance:  Appears stated age Attitude/Demeanor/Rapport:  Other (cooperative) Affect (typically observed):  Appropriate, Pleasant, Calm Orientation:  To Person / place / situation Alcohol / Substance use:  Alcohol Use Psych involvement (Current and /or in the community):  No (Comment)  Discharge Needs  Concerns to be addressed:  Discharge Planning Concerns Readmission within the last 30 days: NO Current discharge risk:  None Barriers to Discharge:  No Barriers Identified   ,  Lee, LCSW [phone] 03/10/2016, 10:50 AM  

## 2016-03-10 NOTE — Progress Notes (Signed)
PULMONARY / CRITICAL CARE MEDICINE   Name: [name] MRN: [id number] DOB: May 02, 1946    ADMISSION DATE:  03/04/2016 CONSULTATION DATE:  5/26  REFERRING MD:  Vanessa Barbara   CHIEF COMPLAINT:  Acute hypercarbic respiratory failure and hypotension   BRIEF:  70 y/o male with multiple comorbid illnesses who developed acute hypercapneic respiratory failure in the setting of narcotic use and HCAP requiring BIPAP and vasopressors.  SUBJECTIVE:  Pt reports feeling better.  Remains off vasopressors.  Denies acute complaints.    VITAL SIGNS: BP 112/61 mmHg  Pulse 59  Temp(Src) 97.9 F (36.6 C) (Axillary)  Resp 23  Ht 6' (1.829 m)  Wt 335 lb (151.955 kg)  BMI 45.42 kg/m2  SpO2 98%  HEMODYNAMICS:    VENTILATOR SETTINGS: Vent Mode:  [-] BIPAP;PCV FiO2 (%):  [30 %] 30 % Set Rate:  [12 bmp] 12 bmp PEEP:  [5 cmH20] 5 cmH20  INTAKE / OUTPUT: I/O last 3 completed shifts: In: 2323 [P.O.:1860; I.V.:463] Out: 3660 [Urine:3660]  PHYSICAL EXAMINATION: General:  Obese, chronically ill appearing male   Neuro:  Awakens to voice, generally weak, oriented / speech clear HEENT: NCAT  CV: Irreg irreg, distant heart sounds Lungs: normal respiratory effort, lungs bilaterally distant, clear anterior Abdomen: BS+, soft Musculoskeletal:  No bony abnormalities Skin:  Edema legs bilaterally, changes c/w chronic venous stasis   LABS:  BMET  Recent Labs Lab 03/08/16 0317 03/09/16 0306 03/10/16 0328  NA 143 141 136  K 5.2* 4.8 4.8  CL 113* 112* 106  CO2 BUN 44* 47* 54*  CREATININE 1.14 1.31* 1.29*  GLUCOSE 110* 113* 163*    Electrolytes  Recent Labs Lab 03/08/16 0317 03/09/16 0306 03/10/16 0328  CALCIUM 8.9 8.4* 8.1*    CBC  Recent Labs Lab 03/08/16 0317 03/09/16 0306 03/10/16 0328  WBC 10.4 9.2 6.3  HGB 10.6* 9.6* 9.7*  HCT 33.6* 29.9* 30.2*  PLT 200 158 160    Coag's  Recent Labs Lab 03/08/16 0317 03/09/16 0306 03/10/16 0328  INR 3.91* 4.22* 2.73*     Sepsis Markers  Recent Labs Lab 03/06/16 1320  LATICACIDVEN 1.0    ABG  Recent Labs Lab 03/06/16 1246 03/06/16 1530  PHART 7.253* 7.268*  PCO2ART 57.8* 53.7*  PO2ART 86.7 138*    Liver Enzymes No results for input(s): AST, ALT, ALKPHOS, BILITOT, ALBUMIN in the last 168 hours.  Cardiac Enzymes No results for input(s): TROPONINI, PROBNP in the last 168 hours.  Glucose  Recent Labs Lab 03/05/16 2236  GLUCAP 132*    Imaging 03/08/2016 chest x-ray images personally reviewed showing bi-basilar atelectasis, possible pulmonary edema and pleural effusions  STUDIES:  03/07/2016 echocardiogram showed normal LV size and function but significant dilation of the right ventricle and right atrium  CULTURES: [facility] 5/25 >>  ANTIBIOTICS: rocephin 5/24 >> azith 5/24 >>  SIGNIFICANT EVENTS: 5/24  Admit with CAP, decompensated cor pulmonale, OHS  LINES/TUBES:   DISCUSSION: Chronically ill gentleman with morbid obesity and deconditioning here with CAP causing respiratory failure.  He appears to have decompensated cor pulmonale secondary to obesity hypoventilation syndrome. Echocardiogram on 03/07/2016 showed evidence of this. Considering his profound deconditioning and morbid obesity this will be a very difficult problem to treat. Overall poor prognosis.  He has made some improvement with diuresis. Gout flare on 03/09/2016.     ASSESSMENT / PLAN:  PULMONARY A: Acute on chronic Hypercarbic Respiratory Failure in setting decompensated cor pulmonale Likely OSA/OHS Acute pulmonary edema: Improving DNR  P:   Mandatory non-invasive mechanical ventilation daily at bedtime Continue diuresis as renal function / BP permit, lasix 40 mg BID Incentive spirometry Out of bed as tolerated, mobilize Minimize sedating medications  CARDIOLOGY  A: Decompensated cor pulmonale  AFIB Chronic diastolic HF Hypotension - not in septic shock, unclear etiology, cardiac? P:  Hold  antihypertensives Lasix as above  ID: A: Cellulitis and CAP (NOS) P:   Cont abx (see above) for 7 day course   NEURO A: Acute Encephalopathy in setting of hypercarbia > improving P:   Hold narcotics and sedating medicines  A: Gout Flare P:  S/p solumedrol x1 dose  Colchicine QD  A: Failure to Thrive / Functional Decline  P:  PT consult Will likely need SNF placement post discharge  FAMILY  - Updates:   Patient updated at bedside on plan of care 5/30.  GLOBAL:  DNR    Canary Brim, NP-C [facility] Pgr: [phone] or if no answer [phone] 03/10/2016, 9:45 AM

## 2016-03-10 NOTE — [place] FL2 (Signed)
[place] MEDICAID FL2 LEVEL OF CARE SCREENING TOOL     IDENTIFICATION  Patient Name: [name] Birthdate: 12-09-1945 Sex: male Admission Date (Current Location): 03/04/2016  [facility] and [place] Number:  Producer, television/film/video and Address:  [facility],  [address] [place] [address]      Provider Number: [id number]  Attending Physician Name and Address:  Jeralyn Bennett, MD  Relative Name and Phone Number:       Current Level of Care: Hospital Recommended Level of Care: Skilled Nursing Facility Prior Approval Number:    Date Approved/Denied:   PASRR Number: [id number]  Discharge Plan: SNF    Current Diagnoses: Patient Active Problem List   Diagnosis Date Noted  . Acute gout   . Cor, pulmonale, acute (HCC)   . CAP (community acquired pneumonia) 03/06/2016  . Respiratory failure (HCC)   . FTT (failure to thrive) in adult 03/05/2016  . Weakness of both lower extremities 03/04/2016  . Chronic systolic CHF (congestive heart failure) (HCC) 03/04/2016  . Morbid obesity - BMI 45 10/23/2015  . Chronic anticoagulation 10/23/2015  . 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 Diastolic dysfunction-grade 13 January 2015 10/23/2015  . 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 Recurrent falls 10/20/2015  . Physical deconditioning 10/20/2015  . Hyperkalemia 10/20/2015  . Fall   . Streptococcus viridans infection 02/13/2015  . Sepsis (HCC) 02/07/2015  . Anemia of chronic disease 02/07/2015  . Alcoholic hepatitis 02/07/2015  . Thrombocytopenia (HCC) 02/07/2015  . Diastolic dysfunction with acute on chronic heart failure (HCC) 02/07/2015  . Elevated troponin 02/06/2015  . Hematoma 04/24/2013  . Chronic atrial fibrillation (HCC) 04/24/2013  . Acute respiratory failure with hypoxia (HCC) 04/23/2013  . Cellulitis 04/23/2013  . Essential hypertension 12/06/2009    Orientation RESPIRATION BLADDER Height & Weight     Self, Situation, Place  O2 (CPAP at night  at d/c.) Incontinent Weight: (!) 151.955 kg (335 lb) Height:  6' (182.9 cm)  BEHAVIORAL SYMPTOMS/MOOD NEUROLOGICAL BOWEL NUTRITION STATUS  Other (Comment) (No Behaviors)   Continent Diet  AMBULATORY STATUS COMMUNICATION OF NEEDS Skin   Extensive Assist Verbally Other (Comment) (Laceration on left arm due to fall at home.)                       Personal Care Assistance Level of Assistance  Bathing, Feeding, Dressing Bathing Assistance: Maximum assistance Feeding assistance: Independent Dressing Assistance: Maximum assistance     Functional Limitations Info  Sight, Hearing, Speech Sight Info: Impaired Hearing Info: Adequate Speech Info: Adequate    SPECIAL CARE FACTORS FREQUENCY  PT (By licensed PT), OT (By licensed OT)     PT Frequency: 5 x wk OT Frequency: 5 x wk            Contractures Contractures Info: Not present    Additional Factors Info  Isolation Precautions, Code Status Code Status Info: DNR       Isolation Precautions Info: MRSA + per PCR / no encounter     Current Medications (03/10/2016):  This is the current hospital active medication list Current Facility-Administered Medications  Medication Dose Route Frequency Provider Last Rate Last Dose  . 0.9 %  sodium chloride infusion  250 mL Intravenous PRN Ron Parker, MD      . 0.9 %  sodium chloride infusion   Intravenous Continuous Jeralyn Bennett, MD 10 mL/hr at 03/08/16 1900    . acetaminophen (TYLENOL) tablet 650 mg  650 mg Oral Q6H PRN Ron Parker, MD       Or  . acetaminophen (TYLENOL) suppository 650 mg  650 mg Rectal Q6H PRN Ron Parker, MD      . allopurinol (ZYLOPRIM) tablet 100 mg  100 mg Oral Daily Ron Parker, MD   100 mg at 03/10/16 1011  . antiseptic oral rinse (CPC / CETYLPYRIDINIUM CHLORIDE 0.05%) solution 7 mL  7 mL Mouth Rinse q12n4p Harvette Velora Heckler, MD   7 mL at 03/09/16 1537  . azithromycin (ZITHROMAX) 500 mg in dextrose 5 % 250 mL IVPB  500 mg  Intravenous Q24H Jeralyn Bennett, MD   500 mg at 03/09/16 1745  . cefTRIAXone (ROCEPHIN) 1 g in dextrose 5 % 50 mL IVPB  1 g Intravenous Q24H Jeralyn Bennett, MD   1 g at 03/09/16 1756  . chlorhexidine (PERIDEX) 0.12 % solution 15 mL  15 mL Mouth Rinse BID Ron Parker, MD   15 mL at 03/10/16 1011  . colchicine tablet 0.6 mg  0.6 mg Oral Daily Jeralyn Bennett, MD   0.6 mg at 03/10/16 1012  . feeding supplement (ENSURE ENLIVE) (ENSURE ENLIVE) liquid 237 mL  237 mL Oral BID BM Ron Parker, MD   237 mL at 03/10/16 1013  . ferrous sulfate tablet 325 mg  325 mg Oral Daily Ron Parker, MD   325 mg at 03/10/16 1012  . folic acid (FOLVITE) tablet 1 mg  1 mg Oral Daily Jeralyn Bennett, MD   1 mg at 03/10/16 1011  . furosemide (LASIX) tablet 40 mg  40 mg Oral BID Jeralyn Bennett, MD   40 mg at 03/10/16 1017  . HYDROmorphone (DILAUDID) injection 0.5-1 mg  0.5-1 mg Intravenous Q3H PRN Ron Parker, MD   1 mg at 03/08/16 0802  . LORazepam (ATIVAN) injection 0-4 mg  0-4 mg Intravenous Q12H Jeralyn Bennett, MD   1 mg at 03/10/16 0808  . LORazepam (ATIVAN) tablet 1 mg  1 mg Oral Q6H PRN Jeralyn Bennett, MD       Or  . LORazepam (ATIVAN) injection 1 mg  1 mg Intravenous Q6H PRN Jeralyn Bennett, MD      . multivitamin with minerals tablet 1 tablet  1 tablet Oral Daily Jeralyn Bennett, MD   1 tablet at 03/10/16 1011  . ondansetron (ZOFRAN) tablet 4 mg  4 mg Oral Q6H PRN Ron Parker, MD       Or  . ondansetron (ZOFRAN) injection 4 mg  4 mg Intravenous Q6H PRN Ron Parker, MD      . oxyCODONE (Oxy IR/ROXICODONE) immediate release tablet 5 mg  5 mg Oral Q4H PRN Ron Parker, MD   5 mg at 03/09/16 1101  . pravastatin (PRAVACHOL) tablet 10 mg  10 mg Oral q1800 Ron Parker, MD   10 mg at 03/09/16 1754  . sodium chloride flush (NS) 0.9 % injection 3 mL  3 mL Intravenous Q12H Ron Parker, MD   3 mL at 03/10/16 1012  . sodium chloride flush (NS) 0.9 % injection 3  mL  3 mL Intravenous PRN Ron Parker, MD      . thiamine (VITAMIN B-1) tablet 100 mg  100 mg Oral Daily Jeralyn Bennett, MD   100 mg at 03/10/16 1011   Or  . thiamine (B-1) injection 100 mg  100 mg Intravenous Daily Jeralyn Bennett, MD      . warfarin (COUMADIN) tablet 0.5  mg  0.5 mg Oral ONCE-1800 Teressa Lower, RPH      . Warfarin - Pharmacist Dosing Inpatient   Does not apply q1800 Teressa Lower, RPH   0  at 03/10/16 1800     Discharge Medications: Please see discharge summary for a list of discharge medications.  Relevant Imaging Results:  Relevant Lab Results:   Additional Information Social Security #: [id number].   Arnulfo Batson, Dickey Gave, LCSW

## 2016-03-10 NOTE — Progress Notes (Signed)
Physical Therapy Treatment Patient Details Name: [name] MRN: [id number] DOB: 19-Apr-1946 Today's Date: 03/10/2016    History of Present Illness [name] is a 70 year old gentleman with multiple comorbidities including diastolic congestive heart failure, transthoracic echocardiogram performed on 02/07/2015 that revealed an EF of 55-60% with grade 3 diastolic dysfunction, history of atrial fibrillation on chronic anticoagulation with warfarin, strip hypertension, admitted to the medicine service on 03/04/2016 when he presented with complaints of generalized weakness. [name] reporting that he has had a progressive decline over the past 6 months having increasing generalized weakness, difficulties performing activities of daily living. He states that 3 months ago began using a walker up until about a week ago when he had a fall at home. Since his fall he has been unable to ambulate.    PT Comments    Pt very lethargic this afternoon and mainly kept eyes closed.  Pt conversing at times and occasionally confused/not making sense.  Pt assisted with moving extremities in supine and repositioning.  Increased time required in attempts to arouse pt and keep pt focused on task and attempting to assist.  Pt repositioned upon leaving room (RN notified).  RN reports pt typically a little more alert and less confused in the morning after using bipap, so will attempt to morning time for next visit.   Follow Up Recommendations  SNF     Equipment Recommendations  None recommended by PT    Recommendations for Other Services       Precautions / Restrictions Precautions Precautions: Fall Precaution Comments: gout pain    Mobility  Bed Mobility Overal bed mobility: Needs Assistance;+2 for physical assistance             General bed mobility comments: total assist for repositioning, pt with increased R hip ER and R neck rotation upon entering room, repositioned with pillows under R side and  neck supported  Transfers                 General transfer comment: NT  Ambulation/Gait                 Stairs            Wheelchair Mobility    Modified Rankin (Stroke Patients Only)       Balance                                    Cognition Arousal/Alertness: Lethargic Behavior During Therapy: Flat affect Overall Cognitive Status: No family/caregiver present to determine baseline cognitive functioning                      Exercises Shoulder Exercises Shoulder Flexion: PROM;Both;10 reps Elbow Flexion: PROM;Both;10 reps Wrist Flexion: PROM;Both;10 reps Wrist Extension: PROM;10 reps;Both Digit Composite Flexion: PROM;Both;10 reps Composite Extension: PROM;10 reps;Both Low Level/ICU Exercises Ankle Circles/Pumps: AAROM;Both;5 reps (L foot better active motion then R) Short Arc Quad: AAROM;Left;5 reps Hip ABduction/ADduction: AAROM;Left;5 reps;Supine Shoulder Flexion: AAROM;Both;5 reps;Supine    General Comments        Pertinent Vitals/Pain Pain Assessment: Faces Pain Score: 5  Faces Pain Scale: Hurts little more Pain Location: B UE with movement Pain Descriptors / Indicators: Sore Pain Intervention(s): Monitored during session;Repositioned  Vitals stable during session per monitor    Home Living  Prior Function            PT Goals (current goals can now be found in the care plan section) Progress towards PT goals: Progressing toward goals    Frequency  Min 3X/week    PT Plan Current plan remains appropriate    Co-evaluation             End of Session   Activity Tolerance: Patient limited by fatigue;Patient limited by lethargy Patient left: in bed;with call bell/phone within reach     Time: 5670-1410 PT Time Calculation (min) (ACUTE ONLY): 30 min  Charges:  $Therapeutic Exercise: 8-22 mins $Therapeutic Activity: 8-22 mins                    G Codes:       Delmon Andrada,KATHrine E 03/10/2016, 4:04 PM Zenovia Jarred, PT, DPT 03/10/2016 Pager: [phone]

## 2016-03-11 ENCOUNTER — Inpatient Hospital Stay (HOSPITAL_COMMUNITY): Payer: Medicare HMO

## 2016-03-11 DIAGNOSIS — R29898 Other symptoms and signs involving the musculoskeletal system: Secondary | ICD-10-CM

## 2016-03-11 LAB — CBC
HEMATOCRIT: 30.3 % — AB (ref 39.0–52.0)
HEMOGLOBIN: 9.9 g/dL — AB (ref 13.0–17.0)
MCH: 34.3 pg — ABNORMAL HIGH (ref 26.0–34.0)
MCHC: 32.7 g/dL (ref 30.0–36.0)
MCV: 104.8 fL — ABNORMAL HIGH (ref 78.0–100.0)
Platelets: 168 10*3/uL (ref 150–400)
RBC: 2.89 MIL/uL — ABNORMAL LOW (ref 4.22–5.81)
RDW: 15.4 % (ref 11.5–15.5)
WBC: 6.7 10*3/uL (ref 4.0–10.5)

## 2016-03-11 LAB — BASIC METABOLIC PANEL
ANION GAP: 6 (ref 5–15)
BUN: 53 mg/dL — ABNORMAL HIGH (ref 6–20)
CO2: 25 mmol/L (ref 22–32)
Calcium: 8.6 mg/dL — ABNORMAL LOW (ref 8.9–10.3)
Chloride: 110 mmol/L (ref 101–111)
Creatinine, Ser: 1.08 mg/dL (ref 0.61–1.24)
GFR calc Af Amer: >60 mL/min (ref >60)
Glucose, Bld: 120 mg/dL — ABNORMAL HIGH (ref 65–99)
POTASSIUM: 4.4 mmol/L (ref 3.5–5.1)
SODIUM: 141 mmol/L (ref 135–145)

## 2016-03-11 LAB — CULTURE, BLOOD (ROUTINE X 2)
CULTURE: NO GROWTH
Culture: NO GROWTH

## 2016-03-11 LAB — PROTIME-INR
INR: 2.33 — AB (ref 0.00–1.49)
Prothrombin Time: 25.3 seconds — ABNORMAL HIGH (ref 11.6–15.2)

## 2016-03-11 MED ORDER — WARFARIN SODIUM 2.5 MG PO TABS
2.5000 mg | ORAL_TABLET | Freq: Once | ORAL | Status: AC
  Administered 2016-03-11: 2.5 mg via ORAL
  Filled 2016-03-11: qty 1

## 2016-03-11 NOTE — Progress Notes (Signed)
Informed by RN of patient refuses to wear BiPAP. RN removed mask. RT will continue to follow.

## 2016-03-11 NOTE — Progress Notes (Signed)
PROGRESS NOTE    [name]  ZOX:[id number] DOB: 11/18/1945 DOA: 03/04/2016 PCP: [name], MD   Brief Narrative: [name] is a 70 year old gentleman with multiple comorbidities including diastolic congestive heart failure, transthoracic echocardiogram performed on 02/07/2015 that revealed an EF of 55-60% with grade 3 diastolic dysfunction, history of atrial fibrillation on chronic anticoagulation with warfarin, hypertension, admitted to the medicine service on 03/04/2016 when he presented with complaints of generalized weakness. Mr [name] reporting that he has had a progressive decline over the past 6 months having increasing generalized weakness, difficulties performing activities of daily living. He states that 3 months ago began using a walker up until about a week ago when he had a fall at home. Since his fall he has been unable to ambulate. Workup during this hospitalization included a chest x-ray that showed left lung base opacity consistent with pneumonia. He was started on empiric IV antibiotic therapy with ceftriaxone and azithromycin. Hospitalization was complicated by the development of acute hypercarbic respiratory failure and mental status changes for which she was transferred to the step down unit. An ABG revealed a pH of 7.25 with PCO2 of 57. Suspect secondary to OSA exacerbation. Pulmonary critical care medicine was consulted. He required IV pressors support which has been weaned off now. He eventually got better with IV Lasix.    Assessment & Plan:    LLL infiltrate- suspected to be due to Community-acquired pneumonia - admits to symptoms of dry cough -He was started on empiric IV antibiotic therapy with ceftriaxone and azithromycin - repeat CXR actually show increasing infiltrates becoming bilateral and more suspicious for pulm edema ( see below) -Blood cultures drawn on 03/06/2016 show no growth to date     Acute hypercarbic respiratory failure with acute  encephalopathy - 5/26 ABG showed a pH of 7.25, PCO2 of 57- had confusion/ lethargy - (MRI negative) - Suspected to be secondary to community-acquired pneumonia, CHF and possibly obesity hypoventilation syndrome/obstructive sleep apnea -He was transferred to the step down unit and placed on BiPAP -Showing clinical improvement, off of BiPAP but still requiring 2 L O2 - Mental status changes resolved  Acute on chronic CHF -IV lasix given as mentioned above starting 5/28  -negative balance by about 2 L -  now back on home dose of 40 BID - Benicar and Coreg on hold    Failure to thrive/functional decline. -Mr [name] reporting a progressive functional decline over the past 6 months, stating that 3 months ago he began using a walker to get around his house then 1 week ago had a fall after which she has been unable to ambulate. -A chest x-ray performed on 03/05/2016 interpreted by radiology as having opacity at the medial left lung base consistent with an infiltrate, new compared to prior exam. -He was started on empiric IV antibiotic therapy with ceftriaxone and azithromycin on 03/05/2016. -Remains weak and severely deconditioned -Physical therapy recommending skilled nursing facility placement -Will need skilled nursing facility placement after this hospitalization     Hypotension. -He has a history of hypertension and had been on Benicar 40 mg by mouth daily, Carvedilol 6.25 mg by mouth twice a day, and Lasix 40 mg by mouth twice a day. - holding meds for now except Lasix   History of atrial fibrillation -CHADSVasc score of 4 -Remains rate controlled, has mentioned above holding beta blocker therapy due to hypotension. -INR trending up to 4.22 likely resulting from the administration of antibiotics -Pharmacy consulted for warfarin management.  History of alcohol abuse. -Family members reporting that he had been drinking beer on a daily basis. - CIWA score has been 1- will d/c protocol for  ETOH withdrawal.    Medical goals of care. -Patient stated that he would not want to undergo cardiopulmonary resuscitation in the event of arrest. Patient had been declining for the past 6 months. Overall prognosis is poor given significant  decline over the past year with multiple comorbidities. On 03/09/2016 he appears to be showing some improvement. Weaned pressors off. On 03/10/2016 showing marked clinical improvement Will likely require SNF placement after this hospitalization.    DVT prophylaxis: Patient is fully anticoagulated  Code Status: DO NOT RESUSCITATE Family Communication:   Disposition Plan: He will remain in the SDU, anticipate discharge to skilled nursing facility  Subjective:  He looks much better today, awake and alert, wanting to eat. States gout pain is improved  Objective: Filed Vitals:   03/11/16 0800 03/11/16 0900 03/11/16 1000 03/11/16 1100  BP: 110/67  98/57   Pulse: 53 68 57 63  Temp: 98.2 F (36.8 C)     TempSrc: Oral     Resp: 20 21 20 18   Height:      Weight:      SpO2: 98% 97% 96% 96%    Intake/Output Summary (Last 24 hours) at 03/11/16 1204 Last data filed at 03/11/16 0900  Gross per 24 hour  Intake    910 ml  Output   2450 ml  Net  -1540 ml   Filed Weights   03/06/16 1414  Weight: 151.955 kg (335 lb)    Examination:  General exam: He is awake and alert, no acute distress, interactive. Overall some improvement Respiratory system: clear to auscultation anteriorly  Cardiovascular system: S1 & S2 heard, RRR. No JVD, murmurs, rubs, gallops or clicks. No pedal edema. Gastrointestinal system: Abdomen is nondistended, soft and nontender. No organomegaly or masses felt. Normal bowel sounds heard. Central nervous system: He has 3-5 muscle strength to bilateral lower extremities Extremities:  He has lateral MCP tenderness with passive and active movement Skin: There are chronic venous stasis changes noted to bilateral lower extremities. Examined  his back there is no evidence of decubitus ulcers. Psychiatry: he is awake and alert oriented 3.   Data Reviewed: I have personally reviewed following labs and imaging studies  CBC:  Recent Labs Lab 03/04/16 1635  03/07/16 0643 03/08/16 0317 03/09/16 0306 03/10/16 0328 03/11/16 0306  WBC 6.3  < > 8.7 10.4 9.2 6.3 6.7  NEUTROABS 5.0  --   --   --   --   --   --   HGB 12.2*  < > 10.8* 10.6* 9.6* 9.7* 9.9*  HCT 37.2*  < > 34.2* 33.6* 29.9* 30.2* 30.3*  MCV 102.2*  < > 106.9* 106.7* 103.8* 104.5* 104.8*  PLT 153  < > 179 200 158 160 168  < > = values in this interval not displayed. Basic Metabolic Panel:  Recent Labs Lab 03/07/16 0643 03/08/16 0317 03/09/16 0306 03/10/16 0328 03/11/16 0306  NA 142 143 141 136 141  K 5.2* 5.2* 4.8 4.8 4.4  CL 113* 113* 112* 106 110  CO2 25 25 26 25 25   GLUCOSE 94 110* 113* 163* 120*  BUN 41* 44* 47* 54* 53*  CREATININE 1.24 1.14 1.31* 1.29* 1.08  CALCIUM 8.7* 8.9 8.4* 8.1* 8.6*   GFR: Estimated Creatinine Clearance: 99.4 mL/min (by C-G formula based on Cr of 1.08). Liver Function Tests: No  results for input(s): AST, ALT, ALKPHOS, BILITOT, PROT, ALBUMIN in the last 168 hours. No results for input(s): LIPASE, AMYLASE in the last 168 hours. No results for input(s): AMMONIA in the last 168 hours. Coagulation Profile:  Recent Labs Lab 03/07/16 0311 03/08/16 0317 03/09/16 0306 03/10/16 0328 03/11/16 0306  INR 3.56* 3.91* 4.22* 2.73* 2.33*   Cardiac Enzymes: No results for input(s): CKTOTAL, CKMB, CKMBINDEX, TROPONINI in the last 168 hours. BNP (last 3 results) No results for input(s): PROBNP in the last 8760 hours. HbA1C: No results for input(s): HGBA1C in the last 72 hours. CBG:  Recent Labs Lab 03/05/16 2236  GLUCAP 132*   Lipid Profile: No results for input(s): CHOL, HDL, LDLCALC, TRIG, CHOLHDL, LDLDIRECT in the last 72 hours. Thyroid Function Tests: No results for input(s): TSH, T4TOTAL, FREET4, T3FREE, THYROIDAB in  the last 72 hours. Anemia Panel: No results for input(s): VITAMINB12, FOLATE, FERRITIN, TIBC, IRON, RETICCTPCT in the last 72 hours. Sepsis Labs:  Recent Labs Lab 03/06/16 1320  LATICACIDVEN 1.0    Recent Results (from the past 240 hour(s))  MRSA PCR Screening     Status: Abnormal   Collection Time: 03/05/16  1:56 AM  Result Value Ref Range Status   MRSA by PCR POSITIVE (A) NEGATIVE Final    Comment:        The GeneXpert MRSA Assay (FDA approved for NASAL specimens only), is one component of a comprehensive MRSA colonization surveillance program. It is not intended to diagnose MRSA infection nor to guide or monitor treatment for MRSA infections. RESULT CALLED TO, READ BACK BY AND VERIFIED WITH: Lynelle Doctor RN @ [phone] ON 03/05/16 BY C DAVIS   Culture, blood (routine x 2)     Status: None   Collection Time: 03/06/16  1:20 PM  Result Value Ref Range Status   Specimen Description BLOOD RIGHT HAND  Final   Special Requests IN PEDIATRIC BOTTLE 4CC  Final   Culture   Final    NO GROWTH 5 DAYS Performed at [facility]    Report Status 03/11/2016 FINAL  Final  Culture, blood (routine x 2)     Status: None   Collection Time: 03/06/16  1:42 PM  Result Value Ref Range Status   Specimen Description BLOOD RIGHT ARM  Final   Special Requests IN PEDIATRIC BOTTLE [facility]  Final   Culture   Final    NO GROWTH 5 DAYS Performed at [facility]    Report Status 03/11/2016 FINAL  Final         Radiology Studies: Dg Chest Port 1 View  03/11/2016  CLINICAL DATA:  Acute respiratory failure with pneumonia, chronic atrial fibrillation, acute cor pulmonale EXAM: PORTABLE CHEST 1 VIEW COMPARISON:  Chest x-ray of Mar 08, 2016 FINDINGS: The patient is rotated. The cardiac silhouette remains enlarged. The pulmonary vascularity remains engorged. Lungs are slightly better inflated on today's study. No large pleural effusion is observed. The bony thorax exhibits no acute abnormality.  IMPRESSION: CHF with pulmonary interstitial edema. Overall there has not been dramatic interval change since the previous study. Electronically Signed   By: David  [place] M.D.   On: 03/11/2016 07:50        Scheduled Meds: . allopurinol  100 mg Oral Daily  . antiseptic oral rinse  7 mL Mouth Rinse q12n4p  . azithromycin  500 mg Intravenous Q24H  . cefTRIAXone (ROCEPHIN)  IV  1 g Intravenous Q24H  . chlorhexidine  15 mL Mouth Rinse BID  .  colchicine  0.6 mg Oral Daily  . feeding supplement (ENSURE ENLIVE)  237 mL Oral BID BM  . ferrous sulfate  325 mg Oral Daily  . folic acid  1 mg Oral Daily  . furosemide  40 mg Oral BID  . LORazepam  0-4 mg Intravenous Q12H  . multivitamin with minerals  1 tablet Oral Daily  . pravastatin  10 mg Oral q1800  . sodium chloride flush  3 mL Intravenous Q12H  . thiamine  100 mg Oral Daily   Or  . thiamine  100 mg Intravenous Daily  . warfarin  2.5 mg Oral ONCE-1800  . Warfarin - Pharmacist Dosing Inpatient   Does not apply q1800   Continuous Infusions: . sodium chloride 10 mL/hr at 03/11/16 0900     LOS: 5 days    Time spent: 35 min    Jj Enyeart, MD [facility] Pager [phone]  If 7PM-7AM, please contact night-coverage [email or web address] Password [facility] 03/11/2016, 12:04 PM

## 2016-03-11 NOTE — Progress Notes (Signed)
Occupational Therapy Treatment Patient Details Name: [name] MRN: [id number] DOB: 1946/07/07 Today's Date: 03/11/2016    History of present illness [name] is a 70 year old gentleman with multiple comorbidities including diastolic congestive heart failure, transthoracic echocardiogram performed on 02/07/2015 that revealed an EF of 55-60% with grade 3 diastolic dysfunction, history of atrial fibrillation on chronic anticoagulation with warfarin, strip hypertension, admitted to the medicine service on 03/04/2016 when he presented with complaints of generalized weakness. [name] reporting that he has had a progressive decline over the past 6 months having increasing generalized weakness, difficulties performing activities of daily living. He states that 3 months ago began using a walker up until about a week ago when he had a fall at home. Since his fall he has been unable to ambulate.   OT comments  Pt with increased Active movement in all planes of motion with BUE but fatigues quickly with ROM. Positioned BUE on pillows in which BUE were elevated and pt could focus and practice wrist extension           Precautions / Restrictions Precautions Precautions: Fall Precaution Comments: gout pain Restrictions Weight Bearing Restrictions: No       Mobility Bed Mobility              NT    Transfers                 NT           ADL Overall ADL's : Needs assistance/impaired Eating/Feeding: Bed level;Maximal assistance Eating/Feeding Details (indicate cue type and reason): focused on holding a cup with 2 hands for drinking. Pt overall max A but this is an improvement                                                    Cognition   Behavior During Therapy: WFL for tasks assessed/performed Overall Cognitive Status: Within Functional Limits for tasks assessed                         Exercises Shoulder Exercises Shoulder Flexion:  PROM;Both;10 reps Elbow Flexion: PROM;Both;10 reps Wrist Flexion: PROM;Both;10 reps Wrist Extension: PROM;10 reps;Both Digit Composite Flexion: PROM;Both;10 reps Composite Extension: PROM;10 reps;Both Hand Exercises Digit Composite Flexion: AROM;10 reps Composite Extension: AAROM;Both;10 reps Opposition: AAROM;Both;10 reps           Pertinent Vitals/ Pain       Pain Score: 5  Pain Location: BUE with AAROM- mostly wrist  Pain Descriptors / Indicators: Sore         Frequency       Progress Toward Goals  OT Goals(current goals can now be found in the care plan section)  Progress towards OT goals: Progressing toward goals     Plan Discharge plan remains appropriate       End of Session     Activity Tolerance Patient tolerated treatment well   Patient Left in bed;with call bell/phone within reach   Nurse Communication          Time: 1100-1120 OT Time Calculation (min): 20 min  Charges: OT Treatments $Therapeutic Exercise: 8-22 mins  [name] 03/11/2016, 11:28 AM

## 2016-03-11 NOTE — Progress Notes (Signed)
ANTICOAGULATION CONSULT NOTE -   Pharmacy Consult for Warfarin Indication: atrial fibrillation  No Known Allergies  Patient Measurements: Height: 6' (182.9 cm) Weight: (!) 335 lb (151.955 kg) IBW/kg (Calculated) : 77.6  Vital Signs: Temp: 98.2 F (36.8 C) (05/31 0800) Temp Source: Oral (05/31 0800) BP: 98/57 mmHg (05/31 1000) Pulse Rate: 57 (05/31 1000)  Labs:  Recent Labs  03/09/16 0306 03/10/16 0328 03/11/16 0306  HGB 9.6* 9.7* 9.9*  HCT 29.9* 30.2* 30.3*  PLT 158 160 168  LABPROT 39.5* 28.5* 25.3*  INR 4.22* 2.73* 2.33*  CREATININE 1.31* 1.29* 1.08   Estimated Creatinine Clearance: 99.4 mL/min (by C-G formula based on Cr of 1.08).  Assessment: 14 yoM to ED 5/24 with bilateral LE weakness and swelling, RLE pain s/p fall at home 3 days ago. No fx per Xray. Chronic Warfarin for Afib: Home dose 2.5mg  MWF, 5mg  other days, LD 5/23 (2000)  Admit INR 3.09  Home dose lower than expected for large body habitus  03/11/2016  INR 2.33, has decreased into therapeutic range after holding doses this week.  Warfarin resumed at low dose yesterday evening.  CBC:  H/H low/stable now, Plt WNL  No bleeding or complications reported.  Drug-drug interactions: antibiotics  Diet: heart healthy, eating  Goal of Therapy:  INR 2-3 Monitor platelets by anticoagulation protocol: Yes   Plan:   Warfarin 2.5 mg today. Continuing to be cautious with resuming warfarin given this week's supratherapeutic INRs.  Daily Protime/INR  Monitor CBC, sign/sx bleed  Clance Boll, PharmD, BCPS Pager: [phone] 03/11/2016 11:14 AM

## 2016-03-12 LAB — PROTIME-INR
INR: 2.47 — ABNORMAL HIGH (ref 0.00–1.49)
Prothrombin Time: 25.7 seconds — ABNORMAL HIGH (ref 11.6–15.2)

## 2016-03-12 MED ORDER — WARFARIN SODIUM 2 MG PO TABS
2.0000 mg | ORAL_TABLET | Freq: Once | ORAL | Status: AC
  Administered 2016-03-12: 2 mg via ORAL
  Filled 2016-03-12 (×2): qty 1

## 2016-03-12 MED ORDER — CARVEDILOL 6.25 MG PO TABS: 6.2500 mg | ORAL_TABLET | Freq: Two times a day (BID) | ORAL | Status: DC

## 2016-03-12 MED ORDER — CARVEDILOL 6.25 MG PO TABS
6.2500 mg | ORAL_TABLET | Freq: Two times a day (BID) | ORAL | Status: DC
  Administered 2016-03-12 (×2): 6.25 mg via ORAL
  Filled 2016-03-12 (×2): qty 1

## 2016-03-12 MED ORDER — LORAZEPAM 2 MG/ML IJ SOLN
1.0000 mg | INTRAMUSCULAR | Status: DC | PRN
  Administered 2016-03-12 – 2016-03-14 (×3): 1 mg via INTRAVENOUS
  Filled 2016-03-12 (×3): qty 1

## 2016-03-12 MED ORDER — LORAZEPAM 2 MG/ML IJ SOLN
1.0000 mg | Freq: Once | INTRAMUSCULAR | Status: AC
  Administered 2016-03-12: 1 mg via INTRAVENOUS
  Filled 2016-03-12: qty 1

## 2016-03-12 NOTE — Progress Notes (Signed)
Occupational Therapy Treatment Patient Details Name: [name] MRN: [id number] DOB: Mar 08, 1946 Today's Date: 03/12/2016    History of present illness [name] is a 70 year old gentleman with multiple comorbidities including diastolic congestive heart failure, transthoracic echocardiogram performed on 02/07/2015 that revealed an EF of 55-60% with grade 3 diastolic dysfunction, history of atrial fibrillation on chronic anticoagulation with warfarin, strip hypertension, admitted to the medicine service on 03/04/2016 when he presented with complaints of generalized weakness. [name] reporting that he has had a progressive decline over the past 6 months having increasing generalized weakness, difficulties performing activities of daily living. He states that 3 months ago began using a walker up until about a week ago when he had a fall at home. Since his fall he has been unable to ambulate.   OT comments  Pt up in chair!  Pt had scooted down . 3 people to pull pt up and readjust. BUe propped on pillows.  Chairs put under feel to prevent scooting out.     Follow Up Recommendations  SNF    Equipment Recommendations  None recommended by OT       Precautions / Restrictions Precautions Precautions: Fall Restrictions Weight Bearing Restrictions: No       Mobility Bed Mobility            NT      Transfers            lift               ADL Overall ADL's : Needs assistance/impaired Eating/Feeding: Sitting;Maximal assistance   Grooming: Sitting;Maximal assistance                                 General ADL Comments: Pt sitting in chair about to slide out.  3 people A to get repositioned in  the chair.                  Cognition   Behavior During Therapy:  (confused) Overall Cognitive Status: Impaired/Different from baseline                       Extremity/Trunk Assessment               Exercises Shoulder Exercises Shoulder  Flexion: Both;10 reps;AROM;AAROM Elbow Flexion: Both;10 reps;AAROM Wrist Flexion: Both;10 reps;AAROM Wrist Extension: 10 reps;Both;AAROM Digit Composite Flexion: Both;10 reps;AAROM Composite Extension: 10 reps;Both;AAROM Hand Exercises Digit Composite Flexion: AROM;10 reps Composite Extension: AAROM;Both;10 reps Opposition: AAROM;Both;10 reps       Prior Functioning/Environment              Frequency Min 2X/week     Progress Toward Goals  OT Goals(current goals can now be found in the care plan section)  Progress towards OT goals: Progressing toward goals     Plan Discharge plan remains appropriate       End of Session     Activity Tolerance Patient tolerated treatment well   Patient Left in chair;with call bell/phone within reach;Other (comment) (pt not able to puch call bell- called for touch pad )   Nurse Communication Mobility status;Need for lift equipment        Time: 1130-1205 OT Time Calculation (min): 35 min  Charges: OT General Charges $OT Visit: 1 Procedure OT Treatments $Therapeutic Activity: 23-37 mins  [name], [place]ung 03/12/2016, 12:23 PM

## 2016-03-12 NOTE — Care Management Important Message (Signed)
Important Message  Patient Details IM Letter given to Kathy/Case Manager to present to Patient Name: KHOLE CANIZARES MRN: [id number] Date of Birth: [birth year]   Medicare Important Message Given:  Yes    Haskell Flirt 03/12/2016, 10:06 AMImportant Message  Patient Details  Name: RYANCHRISTOPHER GROPP MRN: [id number] Date of Birth: 02-22-1946   Medicare Important Message Given:  Yes    Haskell Flirt 03/12/2016, 10:06 AM

## 2016-03-12 NOTE — Progress Notes (Signed)
ANTICOAGULATION CONSULT NOTE -  Follow-up  Pharmacy Consult for Warfarin Indication: atrial fibrillation  No Known Allergies  Patient Measurements: Height: 6' (182.9 cm) Weight: (!) 346 lb (156.945 kg) IBW/kg (Calculated) : 77.6  Vital Signs:    Labs:  Recent Labs  03/10/16 0328 03/11/16 0306 03/12/16 0525  HGB 9.7* 9.9*  --   HCT 30.2* 30.3*  --   PLT 160 168  --   LABPROT 28.5* 25.3* 25.7*  INR 2.73* 2.33* 2.47*  CREATININE 1.29* 1.08  --    Estimated Creatinine Clearance: 101.2 mL/min (by C-G formula based on Cr of 1.08).  Assessment: 46 yoM to ED 5/24 with bilateral LE weakness and swelling, RLE pain s/p fall at home 3 days ago. No fx per Xray. Chronic Warfarin for Afib: Home dose 2.5mg  MWF, 5mg  other days, LD 5/23 (2000)  Admit INR 3.09  Home dose lower than expected for large body habitus  03/12/2016  INR 2.47, therapeutic, has decreased into therapeutic range after holding doses x 6 days.  Warfarin resumed at low dose 5/30PM.  CBC:  5/31 labs - H/H low/stable now, Plt WNL  No bleeding or complications reported.  Drug-drug interactions: antibiotics  Diet: heart healthy, eating  Goal of Therapy:  INR 2-3 Monitor platelets by anticoagulation protocol: Yes   Plan:   Warfarin 2 mg today. Continuing to be cautious with resuming warfarin given this week's supratherapeutic INRs.  Daily Protime/INR  Monitor CBC, sign/sx bleed  Juliette Alcide, PharmD, BCPS.   Pager: [phone] 03/12/2016 9:46 AM

## 2016-03-12 NOTE — Progress Notes (Addendum)
PROGRESS NOTE    [name]  ZOX:[id number] DOB: [birth year] DOA: 03/04/2016 PCP: [name], MD   Brief Narrative: [name] is a 70 year old gentleman with multiple comorbidities including diastolic congestive heart failure, transthoracic echocardiogram performed on 02/07/2015 that revealed an EF of 55-60% with grade 3 diastolic dysfunction, history of atrial fibrillation on chronic anticoagulation with warfarin, hypertension, admitted to the medicine service on 03/04/2016 when he presented with complaints of generalized weakness. [name] reporting that he has had a progressive decline over the past 6 months having increasing generalized weakness, difficulties performing activities of daily living. He states that 3 months ago began using a walker up until about a week ago when he had a fall at home. Since his fall he has been unable to ambulate. Workup during this hospitalization included a chest x-ray that showed left lung base opacity consistent with pneumonia. He was started on empiric IV antibiotic therapy with ceftriaxone and azithromycin. Hospitalization was complicated by the development of acute hypercarbic respiratory failure and mental status changes for which she was transferred to the step down unit. An ABG revealed a pH of 7.25 with PCO2 of 57. Suspect secondary to OSA exacerbation. Pulmonary critical care medicine was consulted. He required IV pressors support which has been weaned off now. He eventually got better with IV Lasix.    Assessment & Plan:    LLL infiltrate- suspected to be due to Community-acquired pneumonia - admits to symptoms of dry cough -He was started on empiric IV antibiotic therapy with ceftriaxone and azithromycin - repeat CXR actually show increasing infiltrates becoming bilateral and more suspicious for pulm edema ( see below) -Blood cultures drawn on 03/06/2016 show no growth to date - will complete 7 days of treatment for CAP     Acute hypercarbic  respiratory failure with acute encephalopathy - 5/26 ABG showed a pH of 7.25, PCO2 of 57- had confusion/ lethargy - (MRI negative) - Suspected to be secondary to community-acquired pneumonia, CHF and possibly obesity hypoventilation syndrome/obstructive sleep apnea -He was transferred to the step down unit and placed on BiPAP -Showing clinical improvement, off of BiPAP but still requiring 2 L O2 likely due to residual pneumonia and atelectasis - Mental status changes resolved  Acute on chronic CHF -IV lasix given as mentioned above starting 5/28  -negative balance by about 2 L -  now back on home dose of 40 BID - ECHO 5/27 revealed elevated end diastolic and atrial filing pressure - Benicar and Coreg on hold due to hypotension- will resume Coreg today    Failure to thrive/functional decline. -[name] reporting a progressive functional decline over the past 6 months, stating that 3 months ago he began using a walker to get around his house then 1 week ago had a fall after which she has been unable to ambulate. -A chest x-ray performed on 03/05/2016 interpreted by radiology as having opacity at the medial left lung base consistent with an infiltrate, new compared to prior exam. -He was started on empiric IV antibiotic therapy with ceftriaxone and azithromycin on 03/05/2016. -Remains weak and severely deconditioned -Physical therapy recommending skilled nursing facility placement -Will need skilled nursing facility placement after this hospitalization  History of alcohol abuse/ acute encephalopathy -Family members reporting that he had been drinking beer on a daily basis. -quite agitated today- start PRN Ativan for ETOH withdrawal delirium vs hospital acquired delirium     Hypotension. -He has a history of hypertension and had been on Benicar 40 mg  by mouth daily, Carvedilol 6.25 mg by mouth twice a day, and Lasix 40 mg by mouth twice a day.  - resume Coreg  Chronic Venous  insufficiency - needs TEDS and leg elevation   History of atrial fibrillation -CHADSVasc score of 4 -Remains rate controlled, has mentioned above holding beta blocker therapy due to hypotension- will resume Coreg today -INR trending up to 4.22 likely resulting from the administration of antibiotics -Pharmacy consulted for warfarin management.    Medical goals of care. -Patient stated that he would not want to undergo cardiopulmonary resuscitation in the event of arrest. Patient had been declining for the past 6 months. Overall prognosis is poor given significant  decline over the past year with multiple comorbidities. On 03/09/2016 he appears to be showing some improvement. Weaned pressors off. On 03/10/2016 showing marked clinical improvement Will likely require SNF placement after this hospitalization.    DVT prophylaxis: Patient is fully anticoagulated  Code Status: DO NOT RESUSCITATE Family Communication:   Disposition Plan:  discharge to skilled nursing facility  Subjective:  alert- cough is improving- having vivid dreams that he is calling hallucinations  Objective: Filed Vitals:   03/11/16 1500 03/11/16 1616 03/11/16 2101 03/12/16 1304  BP:  116/56 109/55 127/74  Pulse: 61 71 70 72  Temp:  98.6 F (37 C) 98.1 F (36.7 C) 98.8 F (37.1 C)  TempSrc:  Oral Oral Oral  Resp: 23 20 20 24   Height:  6' (1.829 m)    Weight:  156.945 kg (346 lb)    SpO2: 97% 98% 94% 95%    Intake/Output Summary (Last 24 hours) at 03/12/16 1346 Last data filed at 03/12/16 1020  Gross per 24 hour  Intake    400 ml  Output   2810 ml  Net  -2410 ml   Filed Weights   03/06/16 1414 03/11/16 1616  Weight: 151.955 kg (335 lb) 156.945 kg (346 lb)    Examination:  General exam: He is awake and alert, no acute distress, interactive. Overall some improvement Respiratory system: clear to auscultation anteriorly  Cardiovascular system: S1 & S2 heard, RRR. No JVD, murmurs, rubs, gallops or clicks.  No pedal edema. Gastrointestinal system: Abdomen is nondistended, soft and nontender. No organomegaly or masses felt. Normal bowel sounds heard. Central nervous system: He has 3-5 muscle strength to bilateral lower extremities Extremities:  He has lateral MCP tenderness with passive and active movement Skin: There are chronic venous stasis changes noted to bilateral lower extremities. Examined his back there is no evidence of decubitus ulcers. Psychiatry: he is awake and alert oriented 3.   Data Reviewed: I have personally reviewed following labs and imaging studies  CBC:  Recent Labs Lab 03/07/16 0643 03/08/16 0317 03/09/16 0306 03/10/16 0328 03/11/16 0306  WBC 8.7 10.4 9.2 6.3 6.7  HGB 10.8* 10.6* 9.6* 9.7* 9.9*  HCT 34.2* 33.6* 29.9* 30.2* 30.3*  MCV 106.9* 106.7* 103.8* 104.5* 104.8*  PLT 179 200 158 160 168   Basic Metabolic Panel:  Recent Labs Lab 03/07/16 0643 03/08/16 0317 03/09/16 0306 03/10/16 0328 03/11/16 0306  NA 142 143 141 136 141  K 5.2* 5.2* 4.8 4.8 4.4  CL 113* 113* 112* 106 110  CO2 25 25 26 25 25   GLUCOSE 94 110* 113* 163* 120*  BUN 41* 44* 47* 54* 53*  CREATININE 1.24 1.14 1.31* 1.29* 1.08  CALCIUM 8.7* 8.9 8.4* 8.1* 8.6*   GFR: Estimated Creatinine Clearance: 101.2 mL/min (by C-G formula based on Cr of 1.08).  Liver Function Tests: No results for input(s): AST, ALT, ALKPHOS, BILITOT, PROT, ALBUMIN in the last 168 hours. No results for input(s): LIPASE, AMYLASE in the last 168 hours. No results for input(s): AMMONIA in the last 168 hours. Coagulation Profile:  Recent Labs Lab 03/08/16 0317 03/09/16 0306 03/10/16 0328 03/11/16 0306 03/12/16 0525  INR 3.91* 4.22* 2.73* 2.33* 2.47*   Cardiac Enzymes: No results for input(s): CKTOTAL, CKMB, CKMBINDEX, TROPONINI in the last 168 hours. BNP (last 3 results) No results for input(s): PROBNP in the last 8760 hours. HbA1C: No results for input(s): HGBA1C in the last 72 hours. CBG:  Recent  Labs Lab 03/05/16 2236  GLUCAP 132*   Lipid Profile: No results for input(s): CHOL, HDL, LDLCALC, TRIG, CHOLHDL, LDLDIRECT in the last 72 hours. Thyroid Function Tests: No results for input(s): TSH, T4TOTAL, FREET4, T3FREE, THYROIDAB in the last 72 hours. Anemia Panel: No results for input(s): VITAMINB12, FOLATE, FERRITIN, TIBC, IRON, RETICCTPCT in the last 72 hours. Sepsis Labs:  Recent Labs Lab 03/06/16 1320  LATICACIDVEN 1.0    Recent Results (from the past 240 hour(s))  MRSA PCR Screening     Status: Abnormal   Collection Time: 03/05/16  1:56 AM  Result Value Ref Range Status   MRSA by PCR POSITIVE (A) NEGATIVE Final    Comment:        The GeneXpert MRSA Assay (FDA approved for NASAL specimens only), is one component of a comprehensive MRSA colonization surveillance program. It is not intended to diagnose MRSA infection nor to guide or monitor treatment for MRSA infections. RESULT CALLED TO, READ BACK BY AND VERIFIED WITH: Lynelle Doctor RN @ [phone] ON 03/05/16 BY C DAVIS   Culture, blood (routine x 2)     Status: None   Collection Time: 03/06/16  1:20 PM  Result Value Ref Range Status   Specimen Description BLOOD RIGHT HAND  Final   Special Requests IN PEDIATRIC BOTTLE 4CC  Final   Culture   Final    NO GROWTH 5 DAYS Performed at [facility]    Report Status 03/11/2016 FINAL  Final  Culture, blood (routine x 2)     Status: None   Collection Time: 03/06/16  1:42 PM  Result Value Ref Range Status   Specimen Description BLOOD RIGHT ARM  Final   Special Requests IN PEDIATRIC BOTTLE [facility]  Final   Culture   Final    NO GROWTH 5 DAYS Performed at [facility]    Report Status 03/11/2016 FINAL  Final         Radiology Studies: Dg Chest Port 1 View  03/11/2016  CLINICAL DATA:  Acute respiratory failure with pneumonia, chronic atrial fibrillation, acute cor pulmonale EXAM: PORTABLE CHEST 1 VIEW COMPARISON:  Chest x-ray of Mar 08, 2016 FINDINGS: The  patient is rotated. The cardiac silhouette remains enlarged. The pulmonary vascularity remains engorged. Lungs are slightly better inflated on today's study. No large pleural effusion is observed. The bony thorax exhibits no acute abnormality. IMPRESSION: CHF with pulmonary interstitial edema. Overall there has not been dramatic interval change since the previous study. Electronically Signed   By: David  [place] M.D.   On: 03/11/2016 07:50        Scheduled Meds: . antiseptic oral rinse  7 mL Mouth Rinse q12n4p  . azithromycin  500 mg Intravenous Q24H  . cefTRIAXone (ROCEPHIN)  IV  1 g Intravenous Q24H  . chlorhexidine  15 mL Mouth Rinse BID  . feeding supplement (ENSURE  ENLIVE)  237 mL Oral BID BM  . ferrous sulfate  325 mg Oral Daily  . folic acid  1 mg Oral Daily  . furosemide  40 mg Oral BID  . multivitamin with minerals  1 tablet Oral Daily  . pravastatin  10 mg Oral q1800  . sodium chloride flush  3 mL Intravenous Q12H  . thiamine  100 mg Oral Daily   Or  . thiamine  100 mg Intravenous Daily  . warfarin  2 mg Oral ONCE-1800  . Warfarin - Pharmacist Dosing Inpatient   Does not apply q1800   Continuous Infusions: . sodium chloride 10 mL/hr at 03/11/16 1949     LOS: 6 days    Time spent: 35 min    Reianna Batdorf, MD [facility] Pager [phone]  If 7PM-7AM, please contact night-coverage [email or web address] Password [email or web address] 03/12/2016, 1:46 PM

## 2016-03-12 NOTE — Progress Notes (Signed)
Patient refuses nocturnal BiPAP at this time. Equipment remains at bedside. RT will continue to follow.

## 2016-03-12 NOTE — Progress Notes (Signed)
Physical Therapy Treatment Patient Details Name: [name] MRN: [id number] DOB: 10/20/1945 Today's Date: 03/12/2016    History of Present Illness [name] is a 70 year old gentleman with multiple comorbidities including diastolic congestive heart failure, transthoracic echocardiogram performed on 02/07/2015 that revealed an EF of 55-60% with grade 3 diastolic dysfunction, history of atrial fibrillation on chronic anticoagulation with warfarin, strip hypertension, admitted to the medicine service on 03/04/2016 when he presented with complaints of generalized weakness. Richard Folwell reporting that he has had a progressive decline over the past 6 months having increasing generalized weakness, difficulties performing activities of daily living. He states that 3 months ago began using a walker up until about a week ago when he had a fall at home. Since his fall he has been unable to ambulate.    PT Comments    Upon arrival, pt up in recliner (hoyer lift per nursing staff) so performed mostly LE exercises.  Pt hallucinating and very confused however was following commands.  Pt's SpO2 85% on room air end of session so RN notified, and RN requested PT place patient back on 2L O2 [place].     Follow Up Recommendations  SNF     Equipment Recommendations  None recommended by PT    Recommendations for Other Services       Precautions / Restrictions Precautions Precautions: Fall Precaution Comments: monitor sats Restrictions Weight Bearing Restrictions: No    Mobility  Bed Mobility               General bed mobility comments: Pt in recliner on arrival, nursing staff used hoyer lift  Transfers                    Ambulation/Gait                 Stairs            Wheelchair Mobility    Modified Rankin (Stroke Patients Only)       Balance                                    Cognition Arousal/Alertness: Awake/alert Behavior During Therapy:   (confused) Overall Cognitive Status: Impaired/Different from baseline (pt reports hallucinations, not orientated, confused but follows commands)                      Exercises General Exercises - Lower Extremity Ankle Circles/Pumps: AROM;Both;10 reps Quad Sets: AROM;Both;10 reps Heel Slides: AAROM;Both;5 reps Hip ABduction/ADduction: AAROM;Both;5 reps  Other Exercises Other Exercises: had pt reach forward to touch object in front of his chest with bilateral UEs    General Comments        Pertinent Vitals/Pain Pain Assessment: No/denies pain    Home Living                      Prior Function            PT Goals (current goals can now be found in the care plan section) Progress towards PT goals: Progressing toward goals (very slowly)    Frequency  Min 3X/week    PT Plan Current plan remains appropriate    Co-evaluation             End of Session   Activity Tolerance: Patient limited by fatigue;Other (comment) (limited by weakness and obesity) Patient left: with call bell/phone within  reach;in chair     Time: [id number] PT Time Calculation (min) (ACUTE ONLY): 15 min  Charges:  $Therapeutic Exercise: 8-22 mins                    G Codes:      Kaden Dunkel,KATHrine E 17-Mar-2016, 1:20 PM  Zenovia Jarred, PT, DPT 03/17/16 Pager: [phone]

## 2016-03-13 DIAGNOSIS — R296 Repeated falls: Secondary | ICD-10-CM

## 2016-03-13 DIAGNOSIS — I10 Essential (primary) hypertension: Secondary | ICD-10-CM

## 2016-03-13 DIAGNOSIS — Z7901 Long term (current) use of anticoagulants: Secondary | ICD-10-CM

## 2016-03-13 DIAGNOSIS — D638 Anemia in other chronic diseases classified elsewhere: Secondary | ICD-10-CM

## 2016-03-13 LAB — CBC
HCT: 31.6 % — ABNORMAL LOW (ref 39.0–52.0)
HEMOGLOBIN: 10.3 g/dL — AB (ref 13.0–17.0)
MCH: 33.4 pg (ref 26.0–34.0)
MCHC: 32.6 g/dL (ref 30.0–36.0)
MCV: 102.6 fL — ABNORMAL HIGH (ref 78.0–100.0)
PLATELETS: 174 10*3/uL (ref 150–400)
RBC: 3.08 MIL/uL — ABNORMAL LOW (ref 4.22–5.81)
RDW: 15.4 % (ref 11.5–15.5)
WBC: 6.1 10*3/uL (ref 4.0–10.5)

## 2016-03-13 LAB — BASIC METABOLIC PANEL
ANION GAP: 7 (ref 5–15)
BUN: 53 mg/dL — AB (ref 6–20)
CALCIUM: 8.9 mg/dL (ref 8.9–10.3)
CO2: 31 mmol/L (ref 22–32)
CREATININE: 1.01 mg/dL (ref 0.61–1.24)
Chloride: 105 mmol/L (ref 101–111)
GFR calc Af Amer: >60 mL/min (ref >60)
GLUCOSE: 99 mg/dL (ref 65–99)
Potassium: 3.7 mmol/L (ref 3.5–5.1)
Sodium: 143 mmol/L (ref 135–145)

## 2016-03-13 LAB — PROTIME-INR
INR: 2.14 — AB (ref 0.00–1.49)
PROTHROMBIN TIME: 23.1 s — AB (ref 11.6–15.2)

## 2016-03-13 LAB — MAGNESIUM: MAGNESIUM: 1.7 mg/dL (ref 1.7–2.4)

## 2016-03-13 MED ORDER — WARFARIN SODIUM 2.5 MG PO TABS
2.5000 mg | ORAL_TABLET | Freq: Once | ORAL | Status: AC
  Administered 2016-03-13: 2.5 mg via ORAL
  Filled 2016-03-13: qty 1

## 2016-03-13 NOTE — Progress Notes (Signed)
Continues to decline the use of nocturnal BiPAP. States "I'm fine". Equipment remains at bedside. RT will continue to follow.

## 2016-03-13 NOTE — Progress Notes (Signed)
LCSW following for disposition. SNF: [facility] called requesting information: today's therapy note. OT note sent as this is the only therapy note available.  Still awaiting to hear if patient is authorized for SNF.  Disposition pending.  Deretha Emory, MSW Clinical Social Work: [employer] [phone]

## 2016-03-13 NOTE — Progress Notes (Signed)
Patient in the 30s and had 4.05 seconds pause, notes patient in bed sleeping, easily awaken, BP 110/68, 53, 95%-2L. Dr. Butler Denmark notified, D/C'd coreg. Will continue to monitor patient.

## 2016-03-13 NOTE — Discharge Summary (Addendum)
Physician Discharge Summary  [name] ZOX:[id number] DOB: 1945-12-02 DOA: 03/04/2016  PCP: Richard Grays, MD  Admit date: 03/04/2016 Discharge date: 03/16/2016   Time spent: 60 minutes Disposition: Going to SNF as he is bed bound now Code Status: DNR  Recommendations for Outpatient Follow-up:  1. Daily weights and weekly Bmet - adjust lasix as neeeded 2. Insist that he wear's BiPAP every night- ensure he has a functional BiPAP machine at home prior to discharge 3. Check INR in 3 days 4. Holding antihypertensives- follow BP- due not resume Coreg due to bradycardia    Discharge Condition: stable    Discharge Diagnoses:  Principal Problem:   CAP (community acquired pneumonia) Active Problems:   Acute respiratory failure with hypoxia (HCC)   Diastolic dysfunction with acute on chronic heart failure (HCC)   Cor, pulmonale, acute (HCC)   Essential hypertension   Chronic atrial fibrillation (HCC)   Anemia of chronic disease   Recurrent falls   Physical deconditioning   Morbid obesity - BMI 45   Chronic anticoagulation   Weakness of both lower extremities   FTT (failure to thrive) in adult   Bradycardia   Alcohol abuse   Acute encephalopathy   Alcohol withdrawal delirium (HCC)   History of present illness:  [name] is a 70 year old gentleman with multiple comorbidities including diastolic congestive heart failure, transthoracic echocardiogram performed on 02/07/2015 that revealed an EF of 55-60% with grade 3 diastolic dysfunction, history of atrial fibrillation on chronic anticoagulation with warfarin, hypertension, admitted to the medicine service on 03/04/2016 when he presented with complaints of generalized weakness. [name] reporting that he has had a progressive decline over the past 6 months having increasing generalized weakness, difficulties performing activities of daily living. He states that 3 months ago began using a walker up until about a week ago when he had a  fall at home. Since his fall he has been unable to ambulate.   Workup during this hospitalization included a chest x-ray that showed left lung base opacity consistent with pneumonia. He was started on empiric IV antibiotic therapy with ceftriaxone and azithromycin. Hospitalization was complicated by the development of acute hypercarbic respiratory failure and mental status changes for which she was transferred to the step down unit. An ABG revealed a pH of 7.25 with PCO2 of 57. Suspect secondary to OSA exacerbation. Pulmonary critical care medicine was consulted. He required IV pressors support which has been weaned off now. He eventually got better with IV Lasix.   Hospital Course:  LLL infiltrate- suspected to be due to Community-acquired pneumonia - admits to symptoms of dry cough -He was started on empiric IV antibiotic therapy with ceftriaxone and azithromycin - repeat CXR actually show increasing infiltrates becoming bilateral and more suspicious for pulm edema ( see below) -Blood cultures drawn on 03/06/2016 show no growth to date - will complete 7 days of treatment for CAP   Acute hypercarbic respiratory failure with acute encephalopathy - 5/26 ABG showed a pH of 7.25, PCO2 of 57- had confusion/ lethargy - (MRI negative) - Suspected to be secondary to community-acquired pneumonia, CHF and possibly obesity hypoventilation syndrome/obstructive sleep apnea -He was transferred to the step down unit and placed on BiPAP -Showing clinical improvement, off of BiPAP but still requiring 2 L O2 likely due to residual pneumonia and atelectasis - Mental status changes resolved  Acute on chronic diastolic CHF -IV lasix given as mentioned above starting 5/28 -negative balance by about 7.5 L - weight 151 kg >>>  130 kg - ECHO 5/27 revealed elevated end diastolic and atrial filing pressure - Benicar and Coreg on hold due to hypotension-   Atrial fibrillation- bradycardia- on Coumadin -CHADSVasc  score of 4 -Remains rate controlled, has mentioned above holding beta blocker therapy due to hypotension- will resume Coreg today -INR trending up to 4.22 likely resulting from the administration of antibiotics -Pharmacy consulted for warfarin management. - resume Coreg but HR dropped to 30s with a 4 second pause - stop Coreg completely for now - HR in 60-70 range  History of alcohol abuse/ acute encephalopathy -Family members reporting that he had been drinking beer on a daily basis. 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  -oddly, it was bilateral -  improved with colchicine - cont Allopurinol    Hypotension. -He has a history of hypertension and had been on Benicar 40 mg by mouth daily, Carvedilol 6.25 mg by mouth twice a day, and Lasix 40 mg by mouth twice a day - continues to have low/ normal BP and therefore do no recommend resuming antihypertensives   Chronic Venous insufficiency - needs TEDS and leg elevation   Medical goals of care- Failure to thrive/functional decline -[name] reports a progressive functional decline over the past 6 months, stating that 3 months ago he began using a walker to get around his house then 1 week ago had a fall after which he has been unable to ambulate. -Remains weak and severely deconditioned -Physical therapy recommending skilled nursing facility placement -Patient stated that he would not want to undergo cardiopulmonary resuscitation in the event of arrest. Patient had been declining for the past 6 months. Overall prognosis is poor given significant decline over the past year with multiple comorbidities.    Discharge Exam: Filed Weights   03/14/16 0551 03/15/16 0643 03/16/16 0223  Weight: 135 kg (297 lb 9.9 oz) 131 kg  (288 lb 12.8 oz) 130 kg (286 lb 9.6 oz)   Filed Vitals:   03/15/16 2147 03/16/16 0616  BP:  98/53  Pulse: 66 64  Temp:  97.7 F (36.5 C)  Resp: 18 18    General: Alert, no distress Cardiovascular: RRR, no murmurs  Respiratory: clear to auscultation bilaterally GI: soft, non-tender, non-distended, bowel sound positive Extermities- changes of chonic venous stasis without edema at this time. No cyanosis or clubbing  Discharge Instructions You were cared for by a hospitalist during your hospital stay. If you have any questions about your discharge medications or the care you received while you were in the hospital after you are discharged, you can call the unit and asked to speak with the hospitalist on call if the hospitalist that took care of you is not available. Once you are discharged, your primary care physician will handle any further medical issues. Please note that NO REFILLS for any discharge medications will be authorized once you are discharged, as it is imperative that you return to your primary care physician (or establish a relationship with a primary care physician if you do not have one) for your aftercare needs so that they can reassess your need for medications and monitor your lab values.      Discharge Instructions    (HEART FAILURE PATIENTS) Call MD:  Anytime you have any of the following  symptoms: 1) 3 pound weight gain in 24 hours or 5 pounds in 1 week 2) shortness of breath, with or without a dry hacking cough 3) swelling in the hands, feet or stomach 4) if you have to sleep on extra pillows at night in order to breathe.    Complete by:  As directed      Diet - low sodium heart healthy    Complete by:  As directed      Diet - low sodium heart healthy    Complete by:  As directed      Increase activity slowly    Complete by:  As directed      Increase activity slowly    Complete by:  As directed             Medication List    STOP taking these medications         carvedilol 3.125 MG tablet  Commonly known as:  COREG     carvedilol 6.25 MG tablet  Commonly known as:  COREG     olmesartan 40 MG tablet  Commonly known as:  BENICAR      TAKE these medications        acetaminophen 325 MG tablet  Commonly known as:  TYLENOL  Take 2 tablets (650 mg total) by mouth every 6 (six) hours as needed for mild pain, moderate pain, fever or headache (or Fever >/= 101).     allopurinol 100 MG tablet  Commonly known as:  ZYLOPRIM  Take 100 mg by mouth daily.     feeding supplement (ENSURE ENLIVE) Liqd  Take 237 mLs by mouth 2 (two) times daily between meals.     feeding supplement (PRO-STAT SUGAR FREE 64) Liqd  Take 30 mLs by mouth 2 (two) times daily.     ferrous sulfate 325 (65 FE) MG tablet  Take 1 tablet by mouth daily.     furosemide 40 MG tablet  Commonly known as:  LASIX  Take 1 tablet (40 mg total) by mouth daily.     furosemide 20 MG tablet  Commonly known as:  LASIX  Take 1 tablet (20 mg total) by mouth daily. At 4 PM     polyethylene glycol packet  Commonly known as:  MIRALAX / GLYCOLAX  Take 17 g by mouth daily.     potassium chloride SA 20 MEQ tablet  Commonly known as:  K-DUR,KLOR-CON  Take 1 tablet (20 mEq total) by mouth daily.     pravastatin 10 MG tablet  Commonly known as:  PRAVACHOL  Take 10 mg by mouth daily.     QUEtiapine 25 MG tablet  Commonly known as:  SEROQUEL  Take 1 tablet (25 mg total) by mouth 2 (two) times daily.     senna-docusate 8.6-50 MG tablet  Commonly known as:  Senokot-S  Take 2 tablets by mouth at bedtime as needed for mild constipation or moderate constipation.     vitamin C 500 MG tablet  Commonly known as:  ASCORBIC ACID  Take 500 mg by mouth daily.     warfarin 5 MG tablet  Commonly known as:  COUMADIN  Take 0.5 tablets (2.5 mg total) by mouth daily at 6 PM.       No Known Allergies Follow-up Information    Follow up with [facility] .   Specialty:   [facility] secretary information:   [address] [place] [address] [phone]  The results of significant diagnostics from this hospitalization (including imaging, microbiology, ancillary and laboratory) are listed below for reference.    Significant Diagnostic Studies: Dg Eye Foreign Body  03/04/2016  CLINICAL DATA:  Metal working/exposure; clearance prior to MRI EXAM: ORBITS FOR FOREIGN BODY - 2 VIEW COMPARISON:  None. FINDINGS: There is no evidence of metallic foreign body within the orbits. No significant bone abnormality identified. IMPRESSION: No evidence of metallic foreign body within the orbits. Electronically Signed   By: Elberta Fortis M.D.   On: 03/04/2016 22:30   Dg Chest 1 View  03/08/2016  CLINICAL DATA:  70 year old male with shortness of breath EXAM: CHEST 1 VIEW COMPARISON:  Prior chest x-ray 03/06/2016 FINDINGS: Stable enlargement of the cardiopericardial silhouette. Slightly improved pulmonary edema. Persistent bibasilar airspace opacities and pulmonary vascular congestion. No significant increase in airspace opacity. No pneumothorax. No acute osseous abnormality. IMPRESSION: 1. Slightly improved pulmonary edema. 2. Persistent bibasilar airspace opacities which may reflect small layering effusions with atelectasis and/or infiltrate. Electronically Signed   By: Malachy Moan M.D.   On: 03/08/2016 09:35   Dg Chest 1 View  03/06/2016  CLINICAL DATA:  Respiratory distress. SOB. Pt is morbidly obese. Hx of CHF, HTN, and AFIB. EXAM: CHEST 1 VIEW COMPARISON:  03/05/2016 FINDINGS: Heart is markedly enlarged. There are patchy infiltrates throughout the lungs bilaterally, significantly increased over prior study now partially obscuring the right hemidiaphragm and completely obscuring the left hemidiaphragm. Findings are consistent with pulmonary edema and/or infectious process. IMPRESSION: Diffuse bilateral infiltrates. Electronically Signed    By: Norva Pavlov M.D.   On: 03/06/2016 13:31   Dg Chest 1 View  03/05/2016  CLINICAL DATA:  Failure to thrive, weakness, fatigue. Hx of CHF, AFIB, hypertension. EXAM: CHEST 1 VIEW COMPARISON:  02/23/2016 FINDINGS: Marked enlargement of the cardiac silhouette. There is opacity at the medial left lung base, consistent with infiltrate, new since prior. No overt edema. No pleural effusions identified. IMPRESSION: Stable, moderate to marked cardiomegaly. Next item interval development of left lower lobe infiltrate. Electronically Signed   By: Norva Pavlov M.D.   On: 03/05/2016 15:33   Dg Lumbar Spine Complete  03/04/2016  CLINICAL DATA:  While sitting on the bench 3 days ago, the bench broke and the patient fell. Low back pain. EXAM: LUMBAR SPINE - COMPLETE 4+ VIEW COMPARISON:  None. FINDINGS: There is thoracolumbar curvature convex to the left and lower lumbar curvature convex to the right. There is degenerative disc disease and some facet degeneration throughout the region. Lateral views are not of any use due to underpenetration. There is no high grade compression fracture or burst fracture. Cannot rule out minor endplate fractures in the region. According to the technologist's note, the patient was not able to be positioned because of pain. If there is clinical concern, CT would be suggested. IMPRESSION: Thoracolumbar curvature. Chronic degenerative disc disease and degenerative facet disease. No high grade compression fracture. Cannot rule out minor endplate fracture. Lateral views are nondiagnostic. Electronically Signed   By: Paulina Fusi M.D.   On: 03/04/2016 19:51   Dg Ankle Complete Right  03/04/2016  CLINICAL DATA:  sat on bench and it broke three days ago. Pt complains of right leg pain in his hip, knee, and ankle. Pt was not specific. Pt did not complain of low back pain. Pt was not able to lay on his side for lumbar lateral films so x-tables were attempted. Multiple techniques were tried  and tube was maxed out and  still unable to penetrate due to pt body habitus. EXAM: RIGHT ANKLE - COMPLETE 3+ VIEW COMPARISON:  04/11/2013 FINDINGS: There is no evidence of fracture, dislocation, or joint effusion. Mild progressive degenerative changes in the midfoot. Large calcaneal spurs as before. There is no evidence of arthropathy or other focal bone abnormality. Soft tissues are unremarkable. IMPRESSION: 1. Negative for fracture or other acute bone abnormality. Electronically Signed   By: Corlis Leak M.D.   On: 03/04/2016 17:38   Richard Brain Wo Contrast  03/05/2016  CLINICAL DATA:  Lower extremity weakness. Failure thrive. Unable to ambulate. EXAM: MRI HEAD WITHOUT CONTRAST TECHNIQUE: Multiplanar, multiecho pulse sequences of the brain and surrounding structures were obtained without intravenous contrast. COMPARISON:  None. FINDINGS: Examination is mildly motion degraded. There are also regions of decreased signal on some sequences due to inability to use the standard head coil. There is no evidence of acute infarct, intracranial hemorrhage, intra-axial mass, midline shift, or extra-axial fluid collection. Ventricles and sulci are within normal limits for age. Patchy T2 hyperintensities throughout the subcortical and deep cerebral white matter bilaterally are nonspecific but compatible with mild-to-moderate chronic small vessel ischemic disease. 10 x 5 mm extra-axial focus of susceptibility artifact adjacent to the anterolateral right temporal lobe may represent a small exostosis or incidental calcified meningioma. Prior right cataract extraction is noted. Paranasal sinuses are clear. There is a trace left mastoid effusion. Major intracranial vascular flow voids are preserved. IMPRESSION: 1. No acute intracranial abnormality. 2. Mild-to-moderate chronic small vessel ischemic disease. Electronically Signed   By: Sebastian Ache M.D.   On: 03/05/2016 19:43   Richard Lumbar Spine Wo Contrast  03/05/2016  CLINICAL DATA:   Bilateral lower extremity weakness. Failure to thrive. Unable to ambulate for the past 3 weeks. EXAM: MRI LUMBAR SPINE WITHOUT CONTRAST TECHNIQUE: Multiplanar, multisequence Richard imaging of the lumbar spine was performed. No intravenous contrast was administered. COMPARISON:  Lumbar spine radiographs 03/04/2016 FINDINGS: The study is largely nondiagnostic due to patient body habitus and resultant poor signal. Sagittal and coronal STIR images are completely nondiagnostic. The anterior lumbar spine is obscured on sagittal T1 images. Axial images were not obtained as the patient could not tolerate additional imaging. There is mild upper lumbar levoscoliosis. There is likely trace retrolisthesis of T12 on L1, L1 on L2, and L2 on L3. Moderate to severe disc space narrowing is present at L1-2 with mild T2 hyperintensity in the disc space on the right. There is mild disc space narrowing at L2-3 with prominent T2 hyperintensity throughout the disc space. Vertebral marrow edema at these levels cannot be adequately assessed for due to nondiagnostic STIR images and artifact through these regions on the T1 series. Assessment of the spinal canal is severely limited, without evidence of critical stenosis on the sagittal T2 series. The distal spinal cord/conus is not well evaluated. IMPRESSION: Largely nondiagnostic study as above. Abnormal fluid signal in the L2-3 disc space. Early infectious discitis cannot be excluded if this is a clinical concern. Electronically Signed   By: Sebastian Ache M.D.   On: 03/05/2016 19:37   Dg Chest Port 1 View  03/11/2016  CLINICAL DATA:  Acute respiratory failure with pneumonia, chronic atrial fibrillation, acute cor pulmonale EXAM: PORTABLE CHEST 1 VIEW COMPARISON:  Chest x-ray of Mar 08, 2016 FINDINGS: The patient is rotated. The cardiac silhouette remains enlarged. The pulmonary vascularity remains engorged. Lungs are slightly better inflated on today's study. No large pleural effusion is  observed. The bony thorax exhibits no  acute abnormality. IMPRESSION: CHF with pulmonary interstitial edema. Overall there has not been dramatic interval change since the previous study. Electronically Signed   By: David  [place] M.D.   On: 03/11/2016 07:50   Dg Chest Port 1 View  03/04/2016  CLINICAL DATA:  Cough EXAM: PORTABLE CHEST 1 VIEW COMPARISON:  10/20/2015 FINDINGS: Chronic cardiopericardial enlargement. Stable aortic and hilar contours. There is increased diffuse interstitial opacity. No convincing pleural fluid. No pneumothorax. Marked osteopenia. IMPRESSION: Acute interstitial opacities, favored congestive over bronchitic. Electronically Signed   By: Marnee Spring M.D.   On: 03/04/2016 18:46   Dg Knee Complete 4 Views Right  03/04/2016  CLINICAL DATA:  Sat on bench and it broke three days ago. Pt complains of right leg pain in his hip, knee, and ankle. Pt was not specific. Pt did not complain of low back pain. Pt was not able to lay on his side for lumbar lateral films so x-tables were attempted. Multiple techniques were tried and tube was maxed out and still unable to penetrate due to pt body habitus. EXAM: RIGHT KNEE - COMPLETE 4+ VIEW COMPARISON:  None. FINDINGS: No evidence of fracture, dislocation. Effusion in the suprapatellar bursa. Small marginal spurs from the lateral femoral condyles and medial tibial plateau. No evidence of arthropathy or other focal bone abnormality. Soft tissues are unremarkable. IMPRESSION: 1. Small effusion with no evidence of fracture or dislocation. 2. Early degenerative spurring in medial and lateral compartments. Electronically Signed   By: Corlis Leak M.D.   On: 03/04/2016 17:36   Dg Hip Unilat With Pelvis 2-3 Views Right  03/04/2016  CLINICAL DATA:  sat on bench and it broke three days ago. Pt complains of right leg pain in his hip, knee, and ankle. Pt was not specific. Pt did not complain of low back pain. Pt was not able to lay on his side for lumbar  lateral films so x-tables were attempted. Multiple techniques were tried and tube was maxed out and still unable to penetrate due to pt body habitus. EXAM: DG HIP (WITH OR WITHOUT PELVIS) 2-3V RIGHT COMPARISON:  CT 04/22/2013 FINDINGS: Mild narrowing of the arch scratch the moderate narrowing of the articular cartilage on the right, mild narrowing on the left. Small marginal spurs from the acetabulum on the right. Negative for fracture or dislocation. Degenerative disc disease in the visualized lower lumbar spine. Bilateral pelvic vascular calcifications. IMPRESSION: 1. Negative for fracture, dislocation, or other acute bone abnormality. 2. Bilateral hip DJD, right greater than left. 3. Lower lumbar degenerative disc disease. Electronically Signed   By: Corlis Leak M.D.   On: 03/04/2016 17:34    Microbiology: Recent Results (from the past 240 hour(s))  Culture, blood (routine x 2)     Status: None   Collection Time: 03/06/16  1:20 PM  Result Value Ref Range Status   Specimen Description BLOOD RIGHT HAND  Final   Special Requests IN PEDIATRIC BOTTLE 4CC  Final   Culture   Final    NO GROWTH 5 DAYS Performed at [facility]    Report Status 03/11/2016 FINAL  Final  Culture, blood (routine x 2)     Status: None   Collection Time: 03/06/16  1:42 PM  Result Value Ref Range Status   Specimen Description BLOOD RIGHT ARM  Final   Special Requests IN PEDIATRIC BOTTLE 2CC  Final   Culture   Final    NO GROWTH 5 DAYS Performed at [facility]  Report Status 03/11/2016 FINAL  Final     Labs: Basic Metabolic Panel:  Recent Labs Lab 03/10/16 0328 03/11/16 0306 03/13/16 0458 03/13/16 0820 03/16/16 0453  NA 136 141 143  --  143  K 4.8 4.4 3.7  --  3.7  CL 106 110 105  --  105  CO2 25 25 31   --  32  GLUCOSE 163* 120* 99  --  81  BUN 54* 53* 53*  --  37*  CREATININE 1.29* 1.08 1.01  --  0.83  CALCIUM 8.1* 8.6* 8.9  --  8.4*  MG  --   --   --  1.7  --    Liver Function  Tests: No results for input(s): AST, ALT, ALKPHOS, BILITOT, PROT, ALBUMIN in the last 168 hours. No results for input(s): LIPASE, AMYLASE in the last 168 hours. No results for input(s): AMMONIA in the last 168 hours. CBC:  Recent Labs Lab 03/10/16 0328 03/11/16 0306 03/13/16 0820 03/16/16 0453  WBC 6.3 6.7 6.1 7.5  HGB 9.7* 9.9* 10.3* 9.9*  HCT 30.2* 30.3* 31.6* 31.1*  MCV 104.5* 104.8* 102.6* 104.0*  PLT 160 168 174 179   Cardiac Enzymes: No results for input(s): CKTOTAL, CKMB, CKMBINDEX, TROPONINI in the last 168 hours. BNP: BNP (last 3 results) No results for input(s): BNP in the last 8760 hours.  ProBNP (last 3 results) No results for input(s): PROBNP in the last 8760 hours.  CBG: No results for input(s): GLUCAP in the last 168 hours.     SignedCalvert Cantor, MD [facility] 03/16/2016, 11:31 AM

## 2016-03-13 NOTE — Progress Notes (Signed)
Occupational Therapy Treatment Patient Details Name: [name] MRN: [id number] DOB: 1946-10-12 Today's Date: 03/13/2016    History of present illness [name] is a 70 year old gentleman with multiple comorbidities including diastolic congestive heart failure, transthoracic echocardiogram performed on 02/07/2015 that revealed an EF of 55-60% with grade 3 diastolic dysfunction, history of atrial fibrillation on chronic anticoagulation with warfarin, strip hypertension, admitted to the medicine service on 03/04/2016 when he presented with complaints of generalized weakness. [name] reporting that he has had a progressive decline over the past 6 months having increasing generalized weakness, difficulties performing activities of daily living. He states that 3 months ago began using a walker up until about a week ago when he had a fall at home. Since his fall he has been unable to ambulate.   OT comments  Patient sleepy this morning. Participated in BUE exercises as below. Continue OT per plan of care.  MD, please write order for BUE splints if you agree. Thank you.  Follow Up Recommendations  SNF    Equipment Recommendations  None recommended by OT    Recommendations for Other Services      Precautions / Restrictions Precautions Precautions: Fall Precaution Comments: monitor sats Restrictions Weight Bearing Restrictions: No       Mobility Bed Mobility                  Transfers                      Balance                                   ADL Overall ADL's : Needs assistance/impaired                                       General ADL Comments: Patient sleepy, agreeable to UE ROM exercises. Participated in BUE AAROM/AROM exercises bed level.      Vision                     Perception     Praxis      Cognition   Behavior During Therapy: Flat affect Overall Cognitive Status: Impaired/Different from  baseline                       Extremity/Trunk Assessment               Exercises Shoulder Exercises Shoulder Flexion: Both;10 reps;AROM;AAROM Elbow Flexion: Both;10 reps;AAROM Wrist Flexion: Both;10 reps;AAROM Wrist Extension: 10 reps;Both;AAROM Digit Composite Flexion: Both;10 reps;AAROM Composite Extension: 10 reps;Both;AAROM Hand Exercises Digit Composite Flexion: AROM;10 reps Composite Extension: AAROM;Both;10 reps Opposition: AAROM;Both;10 reps   Shoulder Instructions       General Comments      Pertinent Vitals/ Pain       Pain Assessment: No/denies pain  Home Living                                          Prior Functioning/Environment              Frequency Min 2X/week     Progress Toward Goals  OT Goals(current goals can now be found in the care plan  section)  Progress towards OT goals: Progressing toward goals     Plan Discharge plan remains appropriate    Co-evaluation                 End of Session     Activity Tolerance Patient tolerated treatment well   Patient Left in bed;with call bell/phone within reach   Nurse Communication          Time: 1478-2956 OT Time Calculation (min): 10 min  Charges: OT General Charges $OT Visit: 1 Procedure OT Treatments $Therapeutic Exercise: 8-22 mins  Kanika Bungert A 03/13/2016, 9:41 AM

## 2016-03-13 NOTE — Progress Notes (Signed)
ANTICOAGULATION CONSULT NOTE -  Follow-up  Pharmacy Consult for Warfarin Indication: atrial fibrillation  No Known Allergies  Patient Measurements: Height: 6' (182.9 cm) Weight: (!) 318 lb (144.244 kg) IBW/kg (Calculated) : 77.6  Vital Signs: Temp: 97.7 F (36.5 C) (06/02 0517) Temp Source: Oral (06/02 0517) BP: 115/54 mmHg (06/02 0517) Pulse Rate: 53 (06/02 0035)  Labs:  Recent Labs  03/11/16 0306 03/12/16 0525 03/13/16 0458 03/13/16 0820  HGB 9.9*  --   --  10.3*  HCT 30.3*  --   --  31.6*  PLT 168  --   --  174  LABPROT 25.3* 25.7* 23.1*  --   INR 2.33* 2.47* 2.14*  --   CREATININE 1.08  --  1.01  --    Estimated Creatinine Clearance: 103.2 mL/min (by C-G formula based on Cr of 1.01).  Assessment: 22 yoM to ED 5/24 with bilateral LE weakness and swelling, RLE pain s/p fall at home 3 days ago. No fx per Xray. Chronic Warfarin for Afib: Home dose 2.5mg  MWF, 5mg  other days, LD 5/23 (2000)  Admit INR 3.09  Home dose lower than expected for large body habitus  03/13/2016  INR 2.47, therapeutic, has decreased into therapeutic range after holding doses x 6 days.  Warfarin resumed at low dose 5/30PM.  CBC:  5/31 labs - H/H low/stable now, Plt WNL  No bleeding or complications reported.  Drug-drug interactions: antibiotics  Diet: heart healthy, eating  Goal of Therapy:  INR 2-3 Monitor platelets by anticoagulation protocol: Yes   Plan:   Warfarin 2.5 mg today. Continuing to be cautious with resuming warfarin given this week's supratherapeutic INRs.  Daily Protime/INR  Monitor CBC, sign/sx bleed  Haynes Hoehn, PharmD, BCPS 03/13/2016, 11:18 AM  Pager: [phone]

## 2016-03-14 DIAGNOSIS — M10049 Idiopathic gout, unspecified hand: Secondary | ICD-10-CM

## 2016-03-14 LAB — PROTIME-INR
INR: 2.16 — AB (ref 0.00–1.49)
Prothrombin Time: 23.2 seconds — ABNORMAL HIGH (ref 11.6–15.2)

## 2016-03-14 MED ORDER — COLCHICINE 0.6 MG PO TABS
0.6000 mg | ORAL_TABLET | ORAL | Status: AC
  Administered 2016-03-14 (×2): 0.6 mg via ORAL
  Filled 2016-03-14 (×2): qty 1

## 2016-03-14 MED ORDER — WARFARIN SODIUM 2.5 MG PO TABS
2.5000 mg | ORAL_TABLET | Freq: Once | ORAL | Status: AC
  Administered 2016-03-14: 2.5 mg via ORAL
  Filled 2016-03-14: qty 1

## 2016-03-14 MED ORDER — QUETIAPINE FUMARATE 25 MG PO TABS
25.0000 mg | ORAL_TABLET | Freq: Two times a day (BID) | ORAL | Status: DC
  Administered 2016-03-14 – 2016-03-16 (×5): 25 mg via ORAL
  Filled 2016-03-14 (×5): qty 1

## 2016-03-14 NOTE — Progress Notes (Signed)
Pt refused nocturnal bipap stating, "I don't need it."  Equipment remains in room on standby.  Pt was advised that RT is available all night should he change his mind.  RN aware.

## 2016-03-14 NOTE — Progress Notes (Signed)
ANTICOAGULATION CONSULT NOTE -  Follow-up  Pharmacy Consult for Warfarin Indication: atrial fibrillation  No Known Allergies  Patient Measurements: Height: 6' (182.9 cm) Weight: 297 lb 9.9 oz (135 kg) (weighed pt accurately in bed, per protocol) IBW/kg (Calculated) : 77.6  Vital Signs: Temp: 98.2 F (36.8 C) (06/03 0532) Temp Source: Oral (06/03 0532) BP: 119/65 mmHg (06/03 0532) Pulse Rate: 68 (06/03 0532)  Labs:  Recent Labs  03/12/16 0525 03/13/16 0458 03/13/16 0820 03/14/16 0459  HGB  --   --  10.3*  --   HCT  --   --  31.6*  --   PLT  --   --  174  --   LABPROT 25.7* 23.1*  --  23.2*  INR 2.47* 2.14*  --  2.16*  CREATININE  --  1.01  --   --    Estimated Creatinine Clearance: 99.6 mL/min (by C-G formula based on Cr of 1.01).  Assessment: 79 yoM to ED 5/24 with bilateral LE weakness and swelling, RLE pain s/p fall at home 3 days ago. No fx per Xray. Chronic Warfarin for Afib: Home dose 2.5mg  MWF, 5mg  other days, LD 5/23 (2000)  Admit INR 3.09  Home dose lower than expected for large body habitus  03/14/2016  INR 2.16 = therapeutic   Warfarin resumed at low dose 5/30PM after holding for several days  CBC:  6/2 labs - H/H low/stable now, Plt WNL  No bleeding or complications reported.  Drug-drug interactions: antibiotics  Diet: heart healthy, eating  Goal of Therapy:  INR 2-3 Monitor platelets by anticoagulation protocol: Yes   Plan:   Warfarin 2.5 mg today. Continuing to be cautious with resuming warfarin given last week's supratherapeutic INRs.  Daily Protime/INR  Monitor CBC, sign/sx bleed  Haynes Hoehn, PharmD, BCPS 03/14/2016, 9:07 AM  Pager: [phone]

## 2016-03-14 NOTE — Progress Notes (Signed)
Physical Therapy Treatment Patient Details Name: [name] MRN: [id number] DOB: [birth year] Today's Date: 03/14/2016    History of Present Illness [name] is a 70 year old gentleman with multiple comorbidities including diastolic congestive heart failure, transthoracic echocardiogram performed on 02/07/2015 that revealed an EF of 55-60% with grade 3 diastolic dysfunction, history of atrial fibrillation on chronic anticoagulation with warfarin, strip hypertension, admitted to the medicine service on 03/04/2016 when he presented with complaints of generalized weakness. [name] reporting that he has had a progressive decline over the past 6 months having increasing generalized weakness, difficulties performing activities of daily living. He states that 3 months ago began using a walker up until about a week ago when he had a fall at home. Since his fall he has been unable to ambulate.    PT Comments    Pt demonstrating increased level of alertness, willingness to participate, and activity tolerance - including attempting sitting up at EOB.  Pt continues to require frequent cueing to maintain focus on and participating with current task.  Follow Up Recommendations  SNF     Equipment Recommendations  None recommended by PT    Recommendations for Other Services OT consult     Precautions / Restrictions Precautions Precautions: Fall Precaution Comments: monitor sats - pt 86% on RA and 94% on 2L Restrictions Weight Bearing Restrictions: No    Mobility  Bed Mobility Overal bed mobility: Needs Assistance;+2 for physical assistance Bed Mobility: Supine to Sit;Sit to Supine     Supine to sit: +2 for physical assistance;Max assist;+2 for safety/equipment Sit to supine: Total assist;+2 for safety/equipment;+2 for physical assistance   General bed mobility comments: Increased time and cues for move to EOB.  Pt minimally assisting self.  Pt sitting at EOB x 4 min with min assist x 2 to  maintain balance.  Returned to supine total assist   Transfers                 General transfer comment: NT  Ambulation/Gait                 Stairs            Wheelchair Mobility    Modified Rankin (Stroke Patients Only)       Balance Overall balance assessment: Needs assistance Sitting-balance support: Feet supported;Bilateral upper extremity supported Sitting balance-Leahy Scale: Poor                              Cognition Arousal/Alertness: Awake/alert Behavior During Therapy: Flat affect Overall Cognitive Status: Impaired/Different from baseline                      Exercises General Exercises - Lower Extremity Ankle Circles/Pumps: AROM;Both;10 reps Quad Sets: AROM;Both;10 reps Heel Slides: AAROM;Both;10 reps;Supine Hip ABduction/ADduction: AAROM;Both;10 reps;Supine    General Comments        Pertinent Vitals/Pain Pain Assessment: Faces Faces Pain Scale: Hurts a little bit Pain Location: Bil LEs with movement Pain Descriptors / Indicators: Sore Pain Intervention(s): Limited activity within patient's tolerance;Monitored during session    Home Living                      Prior Function            PT Goals (current goals can now be found in the care plan section) Acute Rehab PT Goals Patient Stated Goal: sit up PT Goal  Formulation: Patient unable to participate in goal setting Time For Goal Achievement: 03/21/16 Potential to Achieve Goals: Fair Progress towards PT goals: Progressing toward goals    Frequency  Min 3X/week    PT Plan Current plan remains appropriate    Co-evaluation             End of Session   Activity Tolerance: Patient limited by fatigue;Other (comment) Patient left: with call bell/phone within reach;in bed     Time: 4098-1191 PT Time Calculation (min) (ACUTE ONLY): 29 min  Charges:  $Therapeutic Exercise: 8-22 mins $Therapeutic Activity: 8-22 mins                     G Codes:      Diavion Labrador 04/10/2016, 1:13 PM

## 2016-03-14 NOTE — Progress Notes (Addendum)
Clinical Social Work  Per handoff, pt awaiting peer-to-peer authorization for insurance approval. CSW called [facility] and left a message with admission inquiring if any update has been provided re: approval. CSW will continue to follow.  [place], [place] Weekend Coverage [phone]  Addendum: 53 CSW received return call from SNF who reports that [facility] called and reported that they needed updated clinicals faxed on Monday to determine if pt would be approved for placement. SNF is unwilling to take LOG if pt is medically stable to DC prior to Monday to determine if approval will be granted.

## 2016-03-14 NOTE — Progress Notes (Addendum)
PROGRESS NOTE    [name]  YKZ:[id number] DOB: 1946-02-16 DOA: 03/04/2016 PCP: [name], MD   Brief Narrative: [name] is a 70 year old gentleman with multiple comorbidities including diastolic congestive heart failure, transthoracic echocardiogram performed on 02/07/2015 that revealed an EF of 55-60% with grade 3 diastolic dysfunction, history of atrial fibrillation on chronic anticoagulation with warfarin, hypertension, admitted to the medicine service on 03/04/2016 when he presented with complaints of generalized weakness. [name] reporting that he has had a progressive decline over the past 6 months having increasing generalized weakness, difficulties performing activities of daily living. He states that 3 months ago began using a walker up until about a week ago when he had a fall at home. Since his fall he has been unable to ambulate. Workup during this hospitalization included a chest x-ray that showed left lung base opacity consistent with pneumonia. He was started on empiric IV antibiotic therapy with ceftriaxone and azithromycin. Hospitalization was complicated by the development of acute hypercarbic respiratory failure and mental status changes for which she was transferred to the step down unit. An ABG revealed a pH of 7.25 with PCO2 of 57. Suspect secondary to OSA exacerbation. Pulmonary critical care medicine was consulted. He required IV pressors support which has been weaned off now. He eventually got better with IV Lasix.    Assessment & Plan:    LLL infiltrate- suspected to be due to Community-acquired pneumonia - admits to symptoms of dry cough -He was started on empiric IV antibiotic therapy with ceftriaxone and azithromycin - repeat CXR actually show increasing infiltrates becoming bilateral and more suspicious for pulm edema ( see below) -Blood cultures drawn on 03/06/2016 show no growth to date -  Will d/c antibiotics today - completed 10 day course     Acute  hypercarbic respiratory failure with acute encephalopathy - 5/26 ABG showed a pH of 7.25, PCO2 of 57- had confusion/ lethargy - (MRI negative) - Suspected to be secondary to community-acquired pneumonia, CHF and possibly obesity hypoventilation syndrome/obstructive sleep apnea -He was transferred to the step down unit and placed on BiPAP -Showing clinical improvement- off of BiPAP but still requiring 2 L O2 likely due to atelectasis   Acute gout b/l hands? - 1st and 2nd metacarpals red and fluctuant- given 2 doses of Colchicine  Acute on chronic CHF -IV lasix given as mentioned above starting 5/28  -negative balance by about 2 L -  now back on home dose of 40 BID - ECHO 5/27 revealed elevated end diastolic and atrial filing pressure - Benicar and Coreg on hold due to hypotension- will resume Coreg today    Failure to thrive/functional decline. -[name] reporting a progressive functional decline over the past 6 months, stating that 3 months ago he began using a walker to get around his house then 1 week ago had a fall after which she has been unable to ambulate. -A chest x-ray performed on 03/05/2016 interpreted by radiology as having opacity at the medial left lung base consistent with an infiltrate, new compared to prior exam. -He was started on empiric IV antibiotic therapy with ceftriaxone and azithromycin on 03/05/2016. -Remains weak and severely deconditioned -Physical therapy recommending skilled nursing facility placement -Will need skilled nursing facility placement after this hospitalization  History of alcohol abuse/ acute encephalopathy -Family members reporting that he had been drinking beer on a daily basis. - started PRN Ativan for ETOH withdrawal delirium vs hospital acquired delirium - adding low dose Seroquel  Hypotension. -He has a history of hypertension and had been on Benicar 40 mg by mouth daily, Carvedilol 6.25 mg by mouth twice a day, and Lasix 40 mg by  mouth twice a day.  - cont lasix only for now  Chronic Venous insufficiency - needs TEDS and leg elevation   History of atrial fibrillation -CHADSVasc score of 4 -Remains rate controlled, has mentioned above holding beta blocker therapy due to hypotension- will resume Coreg today -INR trending up to 4.22 likely resulting from the administration of antibiotics -Pharmacy consulted for warfarin management.    Medical goals of care. -Patient stated that he would not want to undergo cardiopulmonary resuscitation in the event of arrest. Patient had been declining for the past 6 months. Overall prognosis is poor given significant  decline over the past year with multiple comorbidities. On 03/09/2016 he appears to be showing some improvement. Weaned pressors off. On 03/10/2016 showing marked clinical improvement Will likely require SNF placement after this hospitalization.    DVT prophylaxis: Patient is fully anticoagulated  Code Status: DO NOT RESUSCITATE Family Communication:   Disposition Plan:  discharge to skilled nursing facility  Subjective:  alert- cough is improving- having vivid dreams that he is calling hallucinations  Objective: Filed Vitals:   03/13/16 1256 03/13/16 2140 03/14/16 0532 03/14/16 0551  BP: 116/54 106/56 119/65   Pulse: 54 60 68   Temp: 97.9 F (36.6 C) 99 F (37.2 C) 98.2 F (36.8 C)   TempSrc: Oral Oral Oral   Resp: 20 20 24    Height:      Weight:    135 kg (297 lb 9.9 oz)  SpO2: 98% 97% 98%     Intake/Output Summary (Last 24 hours) at 03/14/16 0854 Last data filed at 03/14/16 0548  Gross per 24 hour  Intake    360 ml  Output   2200 ml  Net  -1840 ml   Filed Weights   03/13/16 0044 03/13/16 0517 03/14/16 0551  Weight: 144.244 kg (318 lb) 144.244 kg (318 lb) 135 kg (297 lb 9.9 oz)    Examination:  General exam: He is awake and alert, no acute distress, interactive. Overall some improvement Respiratory system: clear to auscultation anteriorly    Cardiovascular system: S1 & S2 heard, RRR. No JVD, murmurs, rubs, gallops or clicks. No pedal edema. Gastrointestinal system: Abdomen is nondistended, soft and nontender. No organomegaly or masses felt. Normal bowel sounds heard. Central nervous system: He has 3-5 muscle strength to bilateral lower extremities Extremities:  He has lateral MCP tenderness with passive and active movement Skin: There are chronic venous stasis changes noted to bilateral lower extremities. Examined his back there is no evidence of decubitus ulcers. Psychiatry: he is awake and alert oriented 3.   Data Reviewed: I have personally reviewed following labs and imaging studies  CBC:  Recent Labs Lab 03/08/16 0317 03/09/16 0306 03/10/16 0328 03/11/16 0306 03/13/16 0820  WBC 10.4 9.2 6.3 6.7 6.1  HGB 10.6* 9.6* 9.7* 9.9* 10.3*  HCT 33.6* 29.9* 30.2* 30.3* 31.6*  MCV 106.7* 103.8* 104.5* 104.8* 102.6*  PLT 200 158 160 168 174   Basic Metabolic Panel:  Recent Labs Lab 03/08/16 0317 03/09/16 0306 03/10/16 0328 03/11/16 0306 03/13/16 0458 03/13/16 0820  NA 143 141 136 141 143  --   K 5.2* 4.8 4.8 4.4 3.7  --   CL 113* 112* 106 110 105  --   CO2 25 26 25 25 31   --   GLUCOSE 110* 113*  163* 120* 99  --   BUN 44* 47* 54* 53* 53*  --   CREATININE 1.14 1.31* 1.29* 1.08 1.01  --   CALCIUM 8.9 8.4* 8.1* 8.6* 8.9  --   MG  --   --   --   --   --  1.7   GFR: Estimated Creatinine Clearance: 99.6 mL/min (by C-G formula based on Cr of 1.01). Liver Function Tests: No results for input(s): AST, ALT, ALKPHOS, BILITOT, PROT, ALBUMIN in the last 168 hours. No results for input(s): LIPASE, AMYLASE in the last 168 hours. No results for input(s): AMMONIA in the last 168 hours. Coagulation Profile:  Recent Labs Lab 03/10/16 0328 03/11/16 0306 03/12/16 0525 03/13/16 0458 03/14/16 0459  INR 2.73* 2.33* 2.47* 2.14* 2.16*   Cardiac Enzymes: No results for input(s): CKTOTAL, CKMB, CKMBINDEX, TROPONINI in the  last 168 hours. BNP (last 3 results) No results for input(s): PROBNP in the last 8760 hours. HbA1C: No results for input(s): HGBA1C in the last 72 hours. CBG: No results for input(s): GLUCAP in the last 168 hours. Lipid Profile: No results for input(s): CHOL, HDL, LDLCALC, TRIG, CHOLHDL, LDLDIRECT in the last 72 hours. Thyroid Function Tests: No results for input(s): TSH, T4TOTAL, FREET4, T3FREE, THYROIDAB in the last 72 hours. Anemia Panel: No results for input(s): VITAMINB12, FOLATE, FERRITIN, TIBC, IRON, RETICCTPCT in the last 72 hours. Sepsis Labs: No results for input(s): PROCALCITON, LATICACIDVEN in the last 168 hours.  Recent Results (from the past 240 hour(s))  MRSA PCR Screening     Status: Abnormal   Collection Time: 03/05/16  1:56 AM  Result Value Ref Range Status   MRSA by PCR POSITIVE (A) NEGATIVE Final    Comment:        The GeneXpert MRSA Assay (FDA approved for NASAL specimens only), is one component of a comprehensive MRSA colonization surveillance program. It is not intended to diagnose MRSA infection nor to guide or monitor treatment for MRSA infections. RESULT CALLED TO, READ BACK BY AND VERIFIED WITH: Lynelle Doctor RN @ [phone] ON 03/05/16 BY C DAVIS   Culture, blood (routine x 2)     Status: None   Collection Time: 03/06/16  1:20 PM  Result Value Ref Range Status   Specimen Description BLOOD RIGHT HAND  Final   Special Requests IN PEDIATRIC BOTTLE 4CC  Final   Culture   Final    NO GROWTH 5 DAYS Performed at [facility]    Report Status 03/11/2016 FINAL  Final  Culture, blood (routine x 2)     Status: None   Collection Time: 03/06/16  1:42 PM  Result Value Ref Range Status   Specimen Description BLOOD RIGHT ARM  Final   Special Requests IN PEDIATRIC BOTTLE 2CC  Final   Culture   Final    NO GROWTH 5 DAYS Performed at [facility]    Report Status 03/11/2016 FINAL  Final         Radiology Studies: No results  found.      Scheduled Meds: . antiseptic oral rinse  7 mL Mouth Rinse q12n4p  . chlorhexidine  15 mL Mouth Rinse BID  . colchicine  0.6 mg Oral Q4H  . feeding supplement (ENSURE ENLIVE)  237 mL Oral BID BM  . ferrous sulfate  325 mg Oral Daily  . folic acid  1 mg Oral Daily  . furosemide  40 mg Oral BID  . multivitamin with minerals  1 tablet Oral Daily  .  pravastatin  10 mg Oral q1800  . QUEtiapine  25 mg Oral BID  . sodium chloride flush  3 mL Intravenous Q12H  . thiamine  100 mg Oral Daily   Or  . thiamine  100 mg Intravenous Daily  . Warfarin - Pharmacist Dosing Inpatient   Does not apply q1800   Continuous Infusions: . sodium chloride 10 mL/hr at 03/12/16 2011     LOS: 8 days    Time spent: 35 min    Fallen Crisostomo, MD [facility] Pager [phone]  If 7PM-7AM, please contact night-coverage [email or web address] Password [facility] 03/14/2016, 8:54 AM

## 2016-03-15 LAB — PROTIME-INR
INR: 2.29 — ABNORMAL HIGH (ref 0.00–1.49)
Prothrombin Time: 24.2 seconds — ABNORMAL HIGH (ref 11.6–15.2)

## 2016-03-15 MED ORDER — ALLOPURINOL 100 MG PO TABS
100.0000 mg | ORAL_TABLET | Freq: Every day | ORAL | Status: DC
  Administered 2016-03-15 – 2016-03-16 (×2): 100 mg via ORAL
  Filled 2016-03-15 (×2): qty 1

## 2016-03-15 MED ORDER — WARFARIN SODIUM 2.5 MG PO TABS
2.5000 mg | ORAL_TABLET | Freq: Once | ORAL | Status: AC
  Administered 2016-03-15: 2.5 mg via ORAL
  Filled 2016-03-15: qty 1

## 2016-03-15 NOTE — Progress Notes (Signed)
PROGRESS NOTE    [name]  XYI:[id number] DOB: September 10, 1946 DOA: 03/04/2016 PCP: [name], MD   Brief Narrative: [name] is a 70 year old gentleman with multiple comorbidities including diastolic congestive heart failure, transthoracic echocardiogram performed on 02/07/2015 that revealed an EF of 55-60% with grade 3 diastolic dysfunction, history of atrial fibrillation on chronic anticoagulation with warfarin, hypertension, admitted to the medicine service on 03/04/2016 when he presented with complaints of generalized weakness. [name] reporting that he has had a progressive decline over the past 6 months having increasing generalized weakness, difficulties performing activities of daily living. He states that 3 months ago began using a walker up until about a week ago when he had a fall at home. Since his fall he has been unable to ambulate. Workup during this hospitalization included a chest x-ray that showed left lung base opacity consistent with pneumonia. He was started on empiric IV antibiotic therapy with ceftriaxone and azithromycin. Hospitalization was complicated by the development of acute hypercarbic respiratory failure and mental status changes for which she was transferred to the step down unit. An ABG revealed a pH of 7.25 with PCO2 of 57. Suspect secondary to OSA exacerbation. Pulmonary critical care medicine was consulted. He required IV pressors support which has been weaned off now. He eventually got better with IV Lasix.    Assessment & Plan:    LLL infiltrate- suspected to be due to Community-acquired pneumonia - admits to symptoms of dry cough -He was started on empiric IV antibiotic therapy with ceftriaxone and azithromycin - repeat CXR actually show increasing infiltrates becoming bilateral and more suspicious for pulm edema ( see below) -Blood cultures drawn on 03/06/2016 show no growth to date  - completed 10 day course     Acute hypercarbic respiratory  failure with acute encephalopathy - 5/26 ABG showed a pH of 7.25, PCO2 of 57- had confusion/ lethargy - (MRI negative) - Suspected to be secondary to community-acquired pneumonia, CHF and possibly obesity hypoventilation syndrome/obstructive sleep apnea -He was transferred to the step down unit and placed on BiPAP -Showing clinical improvement- off of BiPAP but still requiring 2 L O2 likely due to atelectasis   Acute gout b/l hands? - 1st and 2nd metacarpals red and fluctuant- given 2 doses of Colchicine- improved- start Allopurinol  Acute on chronic CHF - IV lasix given as mentioned above starting 5/28    -  now back on home dose of 40 BID - ECHO 5/27 revealed elevated end diastolic and atrial filing pressure  - negative balance by 7 L - weight 151>>131 lb     Failure to thrive/functional decline. -[name] reporting a progressive functional decline over the past 6 months, stating that 3 months ago he began using a walker to get around his house then 1 week ago had a fall after which she has been unable to ambulate. -A chest x-ray performed on 03/05/2016 interpreted by radiology as having opacity at the medial left lung base consistent with an infiltrate, new compared to prior exam. -He was started on empiric IV antibiotic therapy with ceftriaxone and azithromycin on 03/05/2016. -Remains weak and severely deconditioned -Physical therapy recommending skilled nursing facility placement -Will need skilled nursing facility placement after this hospitalization  History of alcohol abuse/ acute encephalopathy -Family members reporting that he had been drinking beer on a daily basis. - started PRN Ativan for ETOH withdrawal delirium vs hospital acquired delirium - adding low dose Seroquel has helped a great deal per his nurse  OSA - advised to use CPAP every night     Hypotension. -He has a history of hypertension and had been on Benicar 40 mg by mouth daily, Carvedilol 6.25 mg by mouth  twice a day, and Lasix 40 mg by mouth twice a day.  - cont lasix only for now as he is hypotensive   Chronic Venous insufficiency - needs TEDS and leg elevation   History of atrial fibrillation -CHADSVasc score of 4 -Remains rate controlled, has mentioned above holding beta blocker therapy due to hypotension- will resume Coreg today -INR trending up to 4.22 likely resulting from the administration of antibiotics -Pharmacy consulted for warfarin management.    Medical goals of care. -Patient stated that he would not want to undergo cardiopulmonary resuscitation in the event of arrest. Patient had been declining for the past 6 months. Overall prognosis is poor given significant  decline over the past year with multiple comorbidities. On 03/09/2016 he appears to be showing some improvement. Weaned pressors off. On 03/10/2016 showing marked clinical improvement Will require SNF placement after this hospitalization.    DVT prophylaxis: Patient is fully anticoagulated  Code Status: DO NOT RESUSCITATE Family Communication:   Disposition Plan:  discharge to skilled nursing facility  Subjective: Alert, no complaints.   Objective: Filed Vitals:   03/14/16 0551 03/14/16 1349 03/14/16 2020 03/15/16 0643  BP:  104/56 107/63 95/54  Pulse:  69 67 65  Temp:  100 F (37.8 C) 99 F (37.2 C) 97.9 F (36.6 C)  TempSrc:  Oral Oral Oral  Resp:  Height:      Weight: 135 kg (297 lb 9.9 oz)   131 kg (288 lb 12.8 oz)  SpO2:  97% 97% 94%    Intake/Output Summary (Last 24 hours) at 03/15/16 0918 Last data filed at 03/15/16 0647  Gross per 24 hour  Intake 1125.33 ml  Output   1075 ml  Net  50.33 ml   Filed Weights   03/13/16 0517 03/14/16 0551 03/15/16 0643  Weight: 144.244 kg (318 lb) 135 kg (297 lb 9.9 oz) 131 kg (288 lb 12.8 oz)    Examination:  General exam: He is awake and alert, no acute distress, interactive. Overall some improvement Respiratory system: clear to  auscultation anteriorly  Cardiovascular system: S1 & S2 heard, RRR. No JVD, murmurs, rubs, gallops or clicks. No pedal edema. Gastrointestinal system: Abdomen is nondistended, soft and nontender. No organomegaly or masses felt. Normal bowel sounds heard. Central nervous system: He has 3-5 muscle strength to bilateral lower extremities Extremities:  He has lateral MCP tenderness with passive and active movement Skin: There are chronic venous stasis changes noted to bilateral lower extremities. Examined his back there is no evidence of decubitus ulcers. Psychiatry: he is awake and alert oriented 3.   Data Reviewed: I have personally reviewed following labs and imaging studies  CBC:  Recent Labs Lab 03/09/16 0306 03/10/16 0328 03/11/16 0306 03/13/16 0820  WBC 9.2 6.3 6.7 6.1  HGB 9.6* 9.7* 9.9* 10.3*  HCT 29.9* 30.2* 30.3* 31.6*  MCV 103.8* 104.5* 104.8* 102.6*  PLT 158 160 168 174   Basic Metabolic Panel:  Recent Labs Lab 03/09/16 0306 03/10/16 0328 03/11/16 0306 03/13/16 0458 03/13/16 0820  NA 141 136 141 143  --   K 4.8 4.8 4.4 3.7  --   CL 112* 106 110 105  --   CO2 --   GLUCOSE 113* 163* 120* 99  --  BUN 47* 54* 53* 53*  --   CREATININE 1.31* 1.29* 1.08 1.01  --   CALCIUM 8.4* 8.1* 8.6* 8.9  --   MG  --   --   --   --  1.7   GFR: Estimated Creatinine Clearance: 98 mL/min (by C-G formula based on Cr of 1.01). Liver Function Tests: No results for input(s): AST, ALT, ALKPHOS, BILITOT, PROT, ALBUMIN in the last 168 hours. No results for input(s): LIPASE, AMYLASE in the last 168 hours. No results for input(s): AMMONIA in the last 168 hours. Coagulation Profile:  Recent Labs Lab 03/11/16 0306 03/12/16 0525 03/13/16 0458 03/14/16 0459 03/15/16 0506  INR 2.33* 2.47* 2.14* 2.16* 2.29*   Cardiac Enzymes: No results for input(s): CKTOTAL, CKMB, CKMBINDEX, TROPONINI in the last 168 hours. BNP (last 3 results) No results for input(s): PROBNP in the  last 8760 hours. HbA1C: No results for input(s): HGBA1C in the last 72 hours. CBG: No results for input(s): GLUCAP in the last 168 hours. Lipid Profile: No results for input(s): CHOL, HDL, LDLCALC, TRIG, CHOLHDL, LDLDIRECT in the last 72 hours. Thyroid Function Tests: No results for input(s): TSH, T4TOTAL, FREET4, T3FREE, THYROIDAB in the last 72 hours. Anemia Panel: No results for input(s): VITAMINB12, FOLATE, FERRITIN, TIBC, IRON, RETICCTPCT in the last 72 hours. Sepsis Labs: No results for input(s): PROCALCITON, LATICACIDVEN in the last 168 hours.  Recent Results (from the past 240 hour(s))  Culture, blood (routine x 2)     Status: None   Collection Time: 03/06/16  1:20 PM  Result Value Ref Range Status   Specimen Description BLOOD RIGHT HAND  Final   Special Requests IN PEDIATRIC BOTTLE 4CC  Final   Culture   Final    NO GROWTH 5 DAYS Performed at [facility]    Report Status 03/11/2016 FINAL  Final  Culture, blood (routine x 2)     Status: None   Collection Time: 03/06/16  1:42 PM  Result Value Ref Range Status   Specimen Description BLOOD RIGHT ARM  Final   Special Requests IN PEDIATRIC BOTTLE 2CC  Final   Culture   Final    NO GROWTH 5 DAYS Performed at [facility]    Report Status 03/11/2016 FINAL  Final         Radiology Studies: No results found.      Scheduled Meds: . antiseptic oral rinse  7 mL Mouth Rinse q12n4p  . chlorhexidine  15 mL Mouth Rinse BID  . feeding supplement (ENSURE ENLIVE)  237 mL Oral BID BM  . ferrous sulfate  325 mg Oral Daily  . folic acid  1 mg Oral Daily  . multivitamin with minerals  1 tablet Oral Daily  . pravastatin  10 mg Oral q1800  . QUEtiapine  25 mg Oral BID  . sodium chloride flush  3 mL Intravenous Q12H  . thiamine  100 mg Oral Daily   Or  . thiamine  100 mg Intravenous Daily  . Warfarin - Pharmacist Dosing Inpatient   Does not apply q1800   Continuous Infusions: . sodium chloride 10 mL/hr at  03/12/16 2011     LOS: 9 days    Time spent: 35 min    Sava Proby, MD [facility] Pager [phone]  If 7PM-7AM, please contact night-coverage [email or web address] Password [email or web address] 03/15/2016, 9:18 AM

## 2016-03-15 NOTE — Progress Notes (Signed)
ANTICOAGULATION CONSULT NOTE -  Follow-up  Pharmacy Consult for Warfarin Indication: atrial fibrillation  No Known Allergies  Patient Measurements: Height: 6' (182.9 cm) Weight: 288 lb 12.8 oz (131 kg) IBW/kg (Calculated) : 77.6  Vital Signs: Temp: 97.9 F (36.6 C) (06/04 0643) Temp Source: Oral (06/04 0643) BP: 95/54 mmHg (06/04 0643) Pulse Rate: 65 (06/04 0643)  Labs:  Recent Labs  03/13/16 0458 03/13/16 0820 03/14/16 0459 03/15/16 0506  HGB  --  10.3*  --   --   HCT  --  31.6*  --   --   PLT  --  174  --   --   LABPROT 23.1*  --  23.2* 24.2*  INR 2.14*  --  2.16* 2.29*  CREATININE 1.01  --   --   --    Estimated Creatinine Clearance: 98 mL/min (by C-G formula based on Cr of 1.01).  Assessment: 52 yoM to ED 5/24 with bilateral LE weakness and swelling, RLE pain s/p fall at home 3 days ago. No fx per Xray. Chronic Warfarin for Afib: Home dose 2.5mg  MWF, 5mg  other days, LD 5/23 (2000)  Admit INR 3.09  Home dose lower than expected for large body habitus  03/15/2016  INR remains therapeutic @ 2.29   Warfarin resumed at low dose 5/30PM after holding for several days  CBC:  6/2 labs - H/H low/stable now, Plt WNL  No bleeding or complications reported.  Drug-drug interactions: antibiotics  Diet: heart healthy, eating  Goal of Therapy:  INR 2-3 Monitor platelets by anticoagulation protocol: Yes   Plan:   Warfarin 2.5 mg today. Continuing to be cautious with resuming warfarin given last week's supratherapeutic INRs.  Daily Protime/INR  CBC in AM  Haynes Hoehn, PharmD, BCPS 03/15/2016, 9:58 AM  Pager: [phone]

## 2016-03-15 NOTE — Progress Notes (Signed)
Occupational Therapy Treatment Patient Details Name: [name] MRN: [id number] DOB: 1945/10/15 Today's Date: 03/15/2016    History of present illness [name] is a 70 year old gentleman with multiple comorbidities including diastolic congestive heart failure, transthoracic echocardiogram performed on 02/07/2015 that revealed an EF of 55-60% with grade 3 diastolic dysfunction, history of atrial fibrillation on chronic anticoagulation with warfarin, strip hypertension, admitted to the medicine service on 03/04/2016 when he presented with complaints of generalized weakness. [name] reporting that he has had a progressive decline over the past 6 months having increasing generalized weakness, difficulties performing activities of daily living. He states that 3 months ago began using a walker up until about a week ago when he had a fall at home. Since his fall he has been unable to ambulate.   OT comments  Patient with improved ROM/strength of BUEs this date. Mostly AROM instead of AAROM. Did not report any pain as well. Still with overall generalized weakness and decreased functional use. Continue OT per plan of care.  Follow Up Recommendations  SNF    Equipment Recommendations  None recommended by OT    Recommendations for Other Services      Precautions / Restrictions Precautions Precautions: Fall Restrictions Weight Bearing Restrictions: No       Mobility Bed Mobility                  Transfers                      Balance                                   ADL Overall ADL's : Needs assistance/impaired                                       General ADL Comments: Patient much more awake than previous session. Agreeable to UE ROM exercises. Demonstrated improved AROM shoulder/elbow. Can do gravity-eliminated wrist flex/extension. Better digit flexion/extension and improved thumb flexion/opposition movement.       Vision                      Perception     Praxis      Cognition   Behavior During Therapy: Flat affect Overall Cognitive Status: Impaired/Different from baseline                       Extremity/Trunk Assessment               Exercises Shoulder Exercises Shoulder Flexion: Both;10 reps;AROM RUE;AAROM LUE Elbow Flexion: PROM;Both;10 reps Wrist Flexion: Both;10 reps;AAROM -- gravity eliminated Wrist Extension: 10 reps;Both;AAROM -- gravity eliminated Digit Composite Flexion: AROM;Both;10 reps Composite Extension: AROM;Both;10 reps Hand Exercises Digit Composite Flexion: AROM;Both;10 reps Composite Extension: AROM;Both;10 reps Opposition: AROM;Both;10 reps   Shoulder Instructions       General Comments      Pertinent Vitals/ Pain       Pain Assessment: No/denies pain  Home Living                                          Prior Functioning/Environment  Frequency Min 2X/week     Progress Toward Goals  OT Goals(current goals can now be found in the care plan section)  Progress towards OT goals: Progressing toward goals  Acute Rehab OT Goals Patient Stated Goal: none stated  Plan Discharge plan remains appropriate    Co-evaluation                 End of Session     Activity Tolerance Patient tolerated treatment well   Patient Left in bed;with call bell/phone within reach   Nurse Communication Other (comment) (pt asking about breakfast tray)        Time: 7829-5621 OT Time Calculation (min): 14 min  Charges: OT General Charges $OT Visit: 1 Procedure OT Treatments $Therapeutic Exercise: 8-22 mins  [name] A 03/15/2016, 10:03 AM

## 2016-03-16 ENCOUNTER — Inpatient Hospital Stay (HOSPITAL_COMMUNITY): Payer: Medicare HMO

## 2016-03-16 DIAGNOSIS — F1010 Alcohol abuse, uncomplicated: Secondary | ICD-10-CM

## 2016-03-16 DIAGNOSIS — G9340 Encephalopathy, unspecified: Secondary | ICD-10-CM

## 2016-03-16 DIAGNOSIS — R609 Edema, unspecified: Secondary | ICD-10-CM

## 2016-03-16 DIAGNOSIS — F10931 Alcohol use, unspecified with withdrawal delirium: Secondary | ICD-10-CM

## 2016-03-16 DIAGNOSIS — F10231 Alcohol dependence with withdrawal delirium: Secondary | ICD-10-CM

## 2016-03-16 DIAGNOSIS — R001 Bradycardia, unspecified: Secondary | ICD-10-CM

## 2016-03-16 LAB — BASIC METABOLIC PANEL
Anion gap: 6 (ref 5–15)
BUN: 37 mg/dL — AB (ref 6–20)
CHLORIDE: 105 mmol/L (ref 101–111)
CO2: 32 mmol/L (ref 22–32)
Calcium: 8.4 mg/dL — ABNORMAL LOW (ref 8.9–10.3)
Creatinine, Ser: 0.83 mg/dL (ref 0.61–1.24)
GFR calc Af Amer: >60 mL/min (ref >60)
GFR calc non Af Amer: >60 mL/min (ref >60)
Glucose, Bld: 81 mg/dL (ref 65–99)
POTASSIUM: 3.7 mmol/L (ref 3.5–5.1)
SODIUM: 143 mmol/L (ref 135–145)

## 2016-03-16 LAB — CBC
HEMATOCRIT: 31.1 % — AB (ref 39.0–52.0)
Hemoglobin: 9.9 g/dL — ABNORMAL LOW (ref 13.0–17.0)
MCH: 33.1 pg (ref 26.0–34.0)
MCHC: 31.8 g/dL (ref 30.0–36.0)
MCV: 104 fL — AB (ref 78.0–100.0)
Platelets: 179 10*3/uL (ref 150–400)
RBC: 2.99 MIL/uL — ABNORMAL LOW (ref 4.22–5.81)
RDW: 15.2 % (ref 11.5–15.5)
WBC: 7.5 10*3/uL (ref 4.0–10.5)

## 2016-03-16 LAB — PROTIME-INR
INR: 2.53 — ABNORMAL HIGH (ref 0.00–1.49)
Prothrombin Time: 26.1 seconds — ABNORMAL HIGH (ref 11.6–15.2)

## 2016-03-16 MED ORDER — FUROSEMIDE 40 MG PO TABS: 40.0000 mg | ORAL_TABLET | Freq: Every day | ORAL | Status: DC

## 2016-03-16 MED ORDER — WARFARIN SODIUM 2.5 MG PO TABS
2.5000 mg | ORAL_TABLET | Freq: Once | ORAL | Status: AC
  Administered 2016-03-16: 2.5 mg via ORAL
  Filled 2016-03-16: qty 1

## 2016-03-16 MED ORDER — FUROSEMIDE 20 MG PO TABS: 20.0000 mg | ORAL_TABLET | Freq: Every day | ORAL | Status: DC

## 2016-03-16 MED ORDER — QUETIAPINE FUMARATE 25 MG PO TABS: 25.0000 mg | ORAL_TABLET | Freq: Two times a day (BID) | ORAL | Status: DC

## 2016-03-16 NOTE — Progress Notes (Addendum)
ANTICOAGULATION CONSULT NOTE -  Follow-up  Pharmacy Consult for Warfarin Indication: atrial fibrillation  No Known Allergies  Patient Measurements: Height: 6' (182.9 cm) Weight: 286 lb 9.6 oz (130 kg) IBW/kg (Calculated) : 77.6  Vital Signs: Temp: 97.7 F (36.5 C) (06/05 0616) Temp Source: Oral (06/05 0616) BP: 98/53 mmHg (06/05 0616) Pulse Rate: 64 (06/05 0616)  Labs:  Recent Labs  03/14/16 0459 03/15/16 0506 03/16/16 0453  HGB  --   --  9.9*  HCT  --   --  31.1*  PLT  --   --  179  LABPROT 23.2* 24.2* 26.1*  INR 2.16* 2.29* 2.53*  CREATININE  --   --  0.83   Estimated Creatinine Clearance: 118.8 mL/min (by C-G formula based on Cr of 0.83).  Assessment: 3 yoM to ED 5/24 with bilateral LE weakness and swelling, RLE pain s/p fall at home 3 days ago. No fx per Xray. Chronic Warfarin for Afib: Home dose 2.5mg  MWF, 5mg  other days, LD 5/23 (2000)  Admit INR 3.09  Home dose lower than expected for large body habitus  03/16/2016  INR 2.53, remains therapeutic  Warfarin resumed at low dose 5/30PM after holding for several days  CBC:  H/H low/stable now, Plt WNL  No bleeding or complications reported  No major drug-drug interactions noted (azithromycin d/c on 6/2)  Diet: heart healthy  Goal of Therapy:  INR 2-3 Monitor platelets by anticoagulation protocol: Yes   Plan:   Warfarin 2.5 mg today. Continuing to be cautious with resuming warfarin given last week's supratherapeutic INRs.  Daily Protime/INR  CBC in AM  [facility] PharmD, BCPS Pager [phone] 03/16/2016 10:17 AM    Addendum:  For discharge, would recommend reduced dose warfarin 2.5 mg PO daily until INR can be rechecked.  Recommend rechecking INR by Friday, 03/20/16.  Lynann Beaver PharmD, BCPS Pager [phone] 03/16/2016 10:25 AM

## 2016-03-16 NOTE — Progress Notes (Signed)
Patient has been approved for discharge to SNF: [facility]. Authorization:  LCSW spoke with facility regarding authorization and sending patient if medically stable today.   Patient can be admitted to SNF today.  Will need family to sign paperwork.  Working to address paperwork.  Spoke with patient regarding family to sign him into facility.  Reports Gene his brother can sign him into SNF. Call placed to brother, message left. Awaiting call back to arrange time for paperwork.  MD contacted with regard of insurance auth obtained.  Possible DC if patient stable.  Deretha Emory, MSW Clinical Social Work: [facility] [employer] [phone]

## 2016-03-16 NOTE — Care Management Note (Signed)
Case Management Note  Patient Details  Name: [name] MRN: [id number] Date of Birth: 1946/04/22  Subjective/Objective:Medically stable for d/c. CSW already has sent info to insurance company for [employer].                    Action/Plan:d/c SNF.   Expected Discharge Date:   (unknown)               Expected Discharge Plan:  Skilled Nursing Facility  In-House Referral:  Clinical Social Work  Discharge planning Services  CM Consult  Post Acute Care Choice:    Choice offered to:     DME Arranged:    DME Agency:     HH Arranged:    HH Agency:     Status of Service:  Completed, signed off  Medicare Important Message Given:  Yes Date Medicare IM Given:    Medicare IM give by:    Date Additional Medicare IM Given:    Additional Medicare Important Message give by:     If discussed at Long Length of Stay Meetings, dates discussed:    Additional Comments:  Lanier Clam, RN 03/16/2016, 10:30 AM

## 2016-03-16 NOTE — Care Management Important Message (Signed)
Important Message  Patient Details IM Letter given to Kathy/Case Manager to present to Patient Name: [name] MRN: [id number] Date of Birth: Apr 17, 1946   Medicare Important Message Given:  Yes    Haskell Flirt 03/16/2016, 10:22 AMImportant Message  Patient Details  Name: [name] MRN: [id number] Date of Birth: November 28, 1945   Medicare Important Message Given:  Yes    Haskell Flirt 03/16/2016, 10:22 AM

## 2016-03-16 NOTE — Progress Notes (Signed)
  VASCULAR LAB PRELIMINARY  PRELIMINARY  PRELIMINARY  PRELIMINARY  Left upper extremity venous duplex completed.     Left:  No evidence of DVT,or superficial thrombosis.    Jenetta Loges, RVT, RDMS 03/16/2016, 12:45 PM

## 2016-03-16 NOTE — Progress Notes (Signed)
Patient to be discharged to SNF, [facility]AR to transport, IV removed

## 2016-03-16 NOTE — [place] FL2 (Signed)
[place] MEDICAID FL2 LEVEL OF CARE SCREENING TOOL     IDENTIFICATION  Patient Name: [name] Birthdate: 11/11/1945 Sex: male Admission Date (Current Location): 03/04/2016  [facility] and [place] Number:  Producer, television/film/video and Address:  [facility],  [address], [place] [address]      Provider Number: [id number]  Attending Physician Name and Address:  Calvert Cantor, MD  Relative Name and Phone Number:       Current Level of Care: Hospital Recommended Level of Care: Skilled Nursing Facility Prior Approval Number:    Date Approved/Denied:   PASRR Number: [id number]  Discharge Plan: SNF    Current Diagnoses: Patient Active Problem List   Diagnosis Date Noted  . Cor, pulmonale, acute (HCC)   . CAP (community acquired pneumonia) 03/06/2016  . FTT (failure to thrive) in adult 03/05/2016  . Weakness of both lower extremities 03/04/2016  . Morbid obesity - BMI 45 10/23/2015  . Chronic anticoagulation 10/23/2015  . 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 Diastolic dysfunction-grade 13 January 2015 10/23/2015  . 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 Recurrent falls 10/20/2015  . Physical deconditioning 10/20/2015  . Hyperkalemia 10/20/2015  . Fall   . Streptococcus viridans infection 02/13/2015  . Sepsis (HCC) 02/07/2015  . Anemia of chronic disease 02/07/2015  . Alcoholic hepatitis 02/07/2015  . Thrombocytopenia (HCC) 02/07/2015  . Elevated troponin 02/06/2015  . Hematoma 04/24/2013  . Chronic atrial fibrillation (HCC) 04/24/2013  . Acute respiratory failure with hypoxia (HCC) 04/23/2013  . Cellulitis 04/23/2013  . Essential hypertension 12/06/2009    Orientation RESPIRATION BLADDER Height & Weight     Self, Situation, Place  O2 (CPAP at night at d/c.) Incontinent Weight: 286 lb 9.6 oz (130 kg) Height:  6' (182.9 cm)  BEHAVIORAL SYMPTOMS/MOOD NEUROLOGICAL BOWEL NUTRITION STATUS  Other (Comment) (No Behaviors)   Continent Diet   AMBULATORY STATUS COMMUNICATION OF NEEDS Skin   Extensive Assist Verbally Other (Comment) (Laceration on left arm due to fall at home.)                       Personal Care Assistance Level of Assistance  Bathing, Feeding, Dressing Bathing Assistance: Maximum assistance Feeding assistance: Independent Dressing Assistance: Maximum assistance     Functional Limitations Info  Sight, Hearing, Speech Sight Info: Impaired Hearing Info: Adequate Speech Info: Adequate    SPECIAL CARE FACTORS FREQUENCY  PT (By licensed PT), OT (By licensed OT)     PT Frequency: 5 x wk OT Frequency: 5 x wk            Contractures Contractures Info: Not present    Additional Factors Info  Isolation Precautions, Code Status Code Status Info: DNR       Isolation Precautions Info: MRSA + per PCR / no encounter     Current Medications (03/16/2016):  This is the current hospital active medication list Current Facility-Administered Medications  Medication Dose Route Frequency Provider Last Rate Last Dose  . 0.9 %  sodium chloride infusion  250 mL Intravenous PRN Ron Parker, MD      . 0.9 %  sodium chloride infusion   Intravenous Continuous Jeralyn Bennett, MD 10 mL/hr at 03/12/16 2011    . acetaminophen (TYLENOL) tablet 650 mg  650 mg Oral Q6H PRN Ron Parker, MD       Or  . acetaminophen (TYLENOL) suppository 650 mg  650 mg Rectal Q6H PRN Harvette C  Lovell Sheehan, MD      . allopurinol (ZYLOPRIM) tablet 100 mg  100 mg Oral Daily Calvert Cantor, MD   100 mg at 03/16/16 1016  . antiseptic oral rinse (CPC / CETYLPYRIDINIUM CHLORIDE 0.05%) solution 7 mL  7 mL Mouth Rinse q12n4p Ron Parker, MD   7 mL at 03/15/16 1737  . chlorhexidine (PERIDEX) 0.12 % solution 15 mL  15 mL Mouth Rinse BID Ron Parker, MD   15 mL at 03/16/16 1016  . feeding supplement (ENSURE ENLIVE) (ENSURE ENLIVE) liquid 237 mL  237 mL Oral BID BM Ron Parker, MD   237 mL at 03/16/16 1016  . ferrous  sulfate tablet 325 mg  325 mg Oral Daily Ron Parker, MD   325 mg at 03/16/16 1016  . folic acid (FOLVITE) tablet 1 mg  1 mg Oral Daily Jeralyn Bennett, MD   1 mg at 03/16/16 1016  . multivitamin with minerals tablet 1 tablet  1 tablet Oral Daily Jeralyn Bennett, MD   1 tablet at 03/16/16 1016  . ondansetron (ZOFRAN) tablet 4 mg  4 mg Oral Q6H PRN Ron Parker, MD       Or  . ondansetron (ZOFRAN) injection 4 mg  4 mg Intravenous Q6H PRN Ron Parker, MD      . oxyCODONE (Oxy IR/ROXICODONE) immediate release tablet 5 mg  5 mg Oral Q4H PRN Ron Parker, MD   5 mg at 03/16/16 1016  . pravastatin (PRAVACHOL) tablet 10 mg  10 mg Oral q1800 Ron Parker, MD   10 mg at 03/15/16 1736  . QUEtiapine (SEROQUEL) tablet 25 mg  25 mg Oral BID Calvert Cantor, MD   25 mg at 03/16/16 1016  . sodium chloride flush (NS) 0.9 % injection 3 mL  3 mL Intravenous Q12H Ron Parker, MD   3 mL at 03/16/16 1018  . sodium chloride flush (NS) 0.9 % injection 3 mL  3 mL Intravenous PRN Ron Parker, MD      . thiamine (VITAMIN B-1) tablet 100 mg  100 mg Oral Daily Jeralyn Bennett, MD   100 mg at 03/16/16 1016   Or  . thiamine (B-1) injection 100 mg  100 mg Intravenous Daily Jeralyn Bennett, MD      . warfarin (COUMADIN) tablet 2.5 mg  2.5 mg Oral ONCE-1800 Christine E Shade, RPH      . Warfarin - Pharmacist Dosing Inpatient   Does not apply q1800 Teressa Lower, RPH   0  at 03/10/16 1800     Discharge Medications: Please see discharge summary for a list of discharge medications.  Relevant Imaging Results:  Relevant Lab Results:   Additional Information Social Security #: [id number].   Raye Sorrow, LCSW

## 2016-03-16 NOTE — Progress Notes (Signed)
Mr Tindol is stable for discharge today to SNF. Please see my updated d/c summary from 6/2.   Calvert Cantor, MD [facility]

## 2016-06-01 ENCOUNTER — Other Ambulatory Visit: Payer: Self-pay | Admitting: Gastroenterology

## 2016-06-01 DIAGNOSIS — R945 Abnormal results of liver function studies: Principal | ICD-10-CM

## 2016-06-01 DIAGNOSIS — R7989 Other specified abnormal findings of blood chemistry: Secondary | ICD-10-CM

## 2016-06-23 ENCOUNTER — Other Ambulatory Visit: Payer: Medicare HMO

## 2016-07-01 ENCOUNTER — Ambulatory Visit
Admission: RE | Admit: 2016-07-01 | Discharge: 2016-07-01 | Disposition: A | Discharge status: Home / Self Care | Payer: Medicare HMO | Source: Ambulatory Visit | Attending: Gastroenterology | Admitting: Gastroenterology

## 2016-07-01 DIAGNOSIS — R7989 Other specified abnormal findings of blood chemistry: Secondary | ICD-10-CM

## 2016-07-01 DIAGNOSIS — R945 Abnormal results of liver function studies: Principal | ICD-10-CM

## 2016-08-27 ENCOUNTER — Emergency Department (HOSPITAL_COMMUNITY)
Admission: EM | Admit: 2016-08-27 | Discharge: 2016-08-28 | Disposition: A | Discharge status: Home / Self Care | Payer: Medicare HMO | Attending: Emergency Medicine | Admitting: Emergency Medicine

## 2016-08-27 ENCOUNTER — Emergency Department (HOSPITAL_COMMUNITY): Payer: Medicare HMO

## 2016-08-27 ENCOUNTER — Encounter (HOSPITAL_COMMUNITY): Payer: Self-pay | Admitting: Emergency Medicine

## 2016-08-27 DIAGNOSIS — Z7901 Long term (current) use of anticoagulants: Secondary | ICD-10-CM | POA: Insufficient documentation

## 2016-08-27 DIAGNOSIS — W1830XA Fall on same level, unspecified, initial encounter: Secondary | ICD-10-CM | POA: Insufficient documentation

## 2016-08-27 DIAGNOSIS — S8001XA Contusion of right knee, initial encounter: Secondary | ICD-10-CM | POA: Diagnosis not present

## 2016-08-27 DIAGNOSIS — I110 Hypertensive heart disease with heart failure: Secondary | ICD-10-CM | POA: Insufficient documentation

## 2016-08-27 DIAGNOSIS — Y999 Unspecified external cause status: Secondary | ICD-10-CM | POA: Diagnosis not present

## 2016-08-27 DIAGNOSIS — Z87891 Personal history of nicotine dependence: Secondary | ICD-10-CM | POA: Diagnosis not present

## 2016-08-27 DIAGNOSIS — S8991XA Unspecified injury of right lower leg, initial encounter: Secondary | ICD-10-CM | POA: Diagnosis present

## 2016-08-27 DIAGNOSIS — Y92009 Unspecified place in unspecified non-institutional (private) residence as the place of occurrence of the external cause: Secondary | ICD-10-CM | POA: Diagnosis not present

## 2016-08-27 DIAGNOSIS — Y9301 Activity, walking, marching and hiking: Secondary | ICD-10-CM | POA: Insufficient documentation

## 2016-08-27 DIAGNOSIS — I5033 Acute on chronic diastolic (congestive) heart failure: Secondary | ICD-10-CM | POA: Insufficient documentation

## 2016-08-27 NOTE — ED Notes (Signed)
Pt being put ion for transport home, pt sitting in bed unable to ambulate more than 2 steps without losing his balance

## 2016-08-27 NOTE — ED Provider Notes (Signed)
[facility]-EMERGENCY DEPT Provider Note   CSN: [id number] Arrival date & time: 08/27/16  1820     History   Chief Complaint Chief Complaint  Patient presents with  . Fatigue  . Fall    HPI [name] is a 70 y.o. male.  He is here for evaluation only of right knee pain which is gradually worse after a fall, 5 days ago. He describes walking with his walker, tripping on an uneven space on the floor, causing him to fall, injuring his right knee. Initially, for 2 days. He was able to ambulate, but now is having more trouble because of the pain. The pain is localized to his right knee. He denies injury to the head, neck, back, abdomen, left leg or the arms. He has no other concerns. He is in close communication with his nurse case manager, by telephone. There are no other known modifying factors.    HPI  Past Medical History:  Diagnosis Date  . Alcoholism (HCC)   . Anemia   . Atrial fibrillation or flutter   . Cardiomyopathy    nonischemic  . CHF (congestive heart failure) (HCC)    ejection fractio of 45% per echo in 2011 (previos EF 35-40% in 2003), echo 04/2013  -LVEF 50-55%.  . Complication of anesthesia    April 11, 2013  . GERD (gastroesophageal reflux disease)    takes tums prn  . Hypertension   . Hypoxemia   . Obesity   . Ventricular tachycardia (HCC)    nonsustained, asymptomatic    Patient Active Problem List   Diagnosis Date Noted  . Bradycardia 03/16/2016  . Alcohol abuse 03/16/2016  . Acute encephalopathy 03/16/2016  . Alcohol withdrawal delirium (HCC) 03/16/2016  . Cor, pulmonale, acute (HCC)   . CAP (community acquired pneumonia) 03/06/2016  . Weakness of both lower extremities 03/04/2016  . Morbid obesity - BMI 45 10/23/2015  . Chronic anticoagulation 10/23/2015  . 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 Diastolic dysfunction with acute on chronic heart failure (HCC) 10/23/2015  . 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 Recurrent falls 10/20/2015  .  Physical deconditioning 10/20/2015  . Hyperkalemia 10/20/2015  . Fall   . Streptococcus viridans infection 02/13/2015  . Sepsis (HCC) 02/07/2015  . Anemia of chronic disease 02/07/2015  . Alcoholic hepatitis 02/07/2015  . Thrombocytopenia (HCC) 02/07/2015  . Elevated troponin 02/06/2015  . Hematoma 04/24/2013  . Chronic atrial fibrillation (HCC) 04/24/2013  . Acute respiratory failure with hypoxia (HCC) 04/23/2013  . Cellulitis 04/23/2013  . Essential hypertension 12/06/2009    Past Surgical History:  Procedure Laterality Date  . I&D EXTREMITY Right 06/28/2013   Procedure: IRRIGATION AND DEBRIDEMENT OF RIGHT LEG WITH SURGICAL PREP/PLACEMENT OF ACELL ;  Surgeon: Wayland Denis, DO;  Location: [facility] OR;  Service: Plastics;  Laterality: Right;  . KNEE BURSECTOMY Right 04/20/2013   Procedure: Right knee prepatella bursa decompression;  Surgeon: Cammy Copa, MD;  Location: [facility] OR;  Service: Orthopedics;  Laterality: Right;  . TONSILLECTOMY         Home Medications    Prior to Admission medications   Medication Sig Start Date End Date Taking? Authorizing Provider  acetaminophen (TYLENOL) 325 MG tablet Take 2 tablets (650 mg total) by mouth every 6 (six) hours as needed for mild pain, moderate pain, fever or headache (or Fever >/= 101). 10/25/15  Yes Elease Etienne, MD  allopurinol (ZYLOPRIM) 100 MG tablet Take 100 mg by mouth daily.  01/14/16  Yes Historical Provider, MD  feeding supplement, ENSURE ENLIVE, (ENSURE ENLIVE) LIQD Take 237 mLs by mouth 2 (two) times daily between meals. 10/25/15  Yes Elease Etienne, MD  furosemide (LASIX) 40 MG tablet Take 1 tablet (40 mg total) by mouth daily. 03/16/16  Yes Calvert Cantor, MD  potassium chloride SA (K-DUR,KLOR-CON) 20 MEQ tablet Take 1 tablet (20 mEq total) by mouth daily. 10/25/15  Yes Elease Etienne, MD  pravastatin (PRAVACHOL) 10 MG tablet Take 10 mg by mouth daily.   Yes Historical Provider, MD  QUEtiapine (SEROQUEL) 25 MG tablet Take 1  tablet (25 mg total) by mouth 2 (two) times daily. 03/16/16  Yes Calvert Cantor, MD  senna-docusate (SENOKOT-S) 8.6-50 MG tablet Take 2 tablets by mouth at bedtime as needed for mild constipation or moderate constipation. 10/25/15  Yes Elease Etienne, MD  warfarin (COUMADIN) 5 MG tablet Take 0.5 tablets (2.5 mg total) by mouth daily at 6 PM. Patient taking differently: Take 2.5-5 mg by mouth as directed. Take 1 tablet (5 mg) on Sun, Tues, Thurs, Sat and Take 0.5 tablet (2.5 mg) on MWF 10/26/15  Yes Elease Etienne, MD  Amino Acids-Protein Hydrolys (FEEDING SUPPLEMENT, PRO-STAT SUGAR FREE 64,) LIQD Take 30 mLs by mouth 2 (two) times daily. Patient not taking: Reported on 08/27/2016 10/25/15   Elease Etienne, MD  furosemide (LASIX) 20 MG tablet Take 1 tablet (20 mg total) by mouth daily. At 4 PM Patient not taking: Reported on 08/27/2016 03/16/16   Calvert Cantor, MD  polyethylene glycol (MIRALAX / GLYCOLAX) packet Take 17 g by mouth daily. Patient not taking: Reported on 08/27/2016 10/25/15   Elease Etienne, MD    Family History Family History  Problem Relation Age of Onset  . COPD Mother   . Hypertension Father     Social History Social History  Substance Use Topics  . Smoking status: Former Smoker    Types: Cigarettes    Start date: 06/29/1987  . Smokeless tobacco: Never Used     Comment: stopped in 1985  . Alcohol use 6.0 oz/week    10 Cans of beer per week     Comment: 03/04/16 - "I have not been drunk in 20 years. I can take it or leave it. "     Allergies   Patient has no known allergies.   Review of Systems Review of Systems  All other systems reviewed and are negative.    Physical Exam Updated Vital Signs BP 128/78 (BP Location: Right Arm)   Pulse 75   Resp 18   Wt 280 lb (127 kg)   SpO2 99%   BMI 37.97 kg/m   Physical Exam  Constitutional: He is oriented to person, place, and time. He appears well-developed and well-nourished.  HENT:  Head: Normocephalic and  atraumatic.  Right Ear: External ear normal.  Left Ear: External ear normal.  Eyes: Conjunctivae and EOM are normal. Pupils are equal, round, and reactive to light.  Neck: Normal range of motion and phonation normal. Neck supple.  Cardiovascular: Normal rate.   Pulmonary/Chest: Effort normal. He exhibits no bony tenderness.  Musculoskeletal:  Decreased movement, right leg secondary to pain in the right knee. He is able to elevate both his legs off the bed, a few inches. No deformity of the right knee.  Neurological: He is alert and oriented to person, place, and time. No cranial nerve deficit or sensory deficit. He exhibits normal muscle tone. Coordination normal.  Skin: Skin is  warm, dry and intact.  Psychiatric: He has a normal mood and affect. His behavior is normal. Judgment and thought content normal.  Nursing note and vitals reviewed.    ED Treatments / Results  Labs (all labs ordered are listed, but only abnormal results are displayed) Labs Reviewed - No data to display  EKG  EKG Interpretation None       Radiology Dg Knee Complete 4 Views Right  Result Date: 08/27/2016 CLINICAL DATA:  Generalized weakness, increased falls at home due to RIGHT leg giving out. Bed bound for 3 days. EXAM: RIGHT KNEE - COMPLETE 4+ VIEW COMPARISON:  RIGHT knee radiograph Mar 04, 2016 FINDINGS: No fracture deformity dislocation. Moderate medial and lateral compartment joint space narrowing with marginal spurring and periarticular sclerosis. Medial spine peaking. Mild patellofemoral compartment osteoarthrosis. No destructive bony lesions. Small suprapatellar joint effusion. IMPRESSION: Small suprapatellar joint effusion without acute osseous process. 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 Electronically Signed   By: Awilda Metroourtnay  Bloomer M.D.   On: 08/27/2016 20:11    Procedures Procedures (including critical care time)  Medications Ordered in  ED Medications - No data to display   Initial Impression / Assessment and Plan / ED Course  I have reviewed the triage vital signs and the nursing notes.  Pertinent labs & imaging results that were available during my care of the patient were reviewed by me and considered in my medical decision making (see chart for details).  Clinical Course     Medications - No data to display  Patient Vitals for the past 24 hrs:  BP Pulse Resp SpO2 Weight  08/27/16 2005 128/78 75 18 99 % -  08/27/16 1832 123/67 80 - 94 % -  08/27/16 1831 - - - - 280 lb (127 kg)    8:53 PM Reevaluation with update and discussion. After initial assessment and treatment, an updated evaluation reveals No further complaints. Findings discussed with patient, all questions were answered. Jesseca Marsch L    Final Clinical Impressions(s) / ED Diagnoses   Final diagnoses:  Knee injuries, right, initial encounter   Contusion right knee, with arthritis. Doubt fracture or septic arthritis.  Nursing Notes Reviewed/ Care Coordinated Applicable Imaging Reviewed Interpretation of Laboratory Data incorporated into ED treatment  The patient appears reasonably screened and/or stabilized for discharge and I doubt any other medical condition or other [facility] requiring further screening, evaluation, or treatment in the ED at this time prior to discharge.  Plan: Home Medications- APAP for pain; Home Treatments- rest, knee sleeve prn; return here if the recommended treatment, does not improve the symptoms; Recommended follow up- PCP prn   New Prescriptions New Prescriptions   No medications on file     Mancel BaleElliott Eleuterio Dollar, MD 08/27/16 2056

## 2016-08-27 NOTE — ED Triage Notes (Addendum)
Patient from home with EMS. States he is unable to get out of bed for 3 days, c/o generalized weakness and increase in falls at home. 130/80, 80 AFIB HR, 99% room air, CBG 89. Patient arrives at ED, incontinent. Alert and oriented x 4. 20 left hand. Patient on coumadin, denies LOC or hitting head. States he is "sliding out of bed" Denies injury.

## 2016-08-27 NOTE — Discharge Instructions (Signed)
Wear the knee sleeve, as needed to help the discomfort.  Use heat on the sore area 3 or 4 times a day.   Always use your walker when you are ambulating

## 2016-10-29 ENCOUNTER — Emergency Department (HOSPITAL_COMMUNITY): Payer: Medicare HMO

## 2016-10-29 ENCOUNTER — Encounter (HOSPITAL_COMMUNITY): Payer: Self-pay | Admitting: Emergency Medicine

## 2016-10-29 ENCOUNTER — Inpatient Hospital Stay (HOSPITAL_COMMUNITY)
Admission: EM | Admit: 2016-10-29 | Discharge: 2016-11-04 | DRG: 291 | Disposition: A | Discharge status: Skilled Nursing Facility | Payer: Medicare HMO | Attending: Family Medicine | Admitting: Family Medicine

## 2016-10-29 DIAGNOSIS — R0902 Hypoxemia: Secondary | ICD-10-CM | POA: Diagnosis not present

## 2016-10-29 DIAGNOSIS — I482 Chronic atrial fibrillation, unspecified: Secondary | ICD-10-CM | POA: Diagnosis present

## 2016-10-29 DIAGNOSIS — I5023 Acute on chronic systolic (congestive) heart failure: Secondary | ICD-10-CM

## 2016-10-29 DIAGNOSIS — K219 Gastro-esophageal reflux disease without esophagitis: Secondary | ICD-10-CM | POA: Diagnosis present

## 2016-10-29 DIAGNOSIS — I509 Heart failure, unspecified: Secondary | ICD-10-CM

## 2016-10-29 DIAGNOSIS — M109 Gout, unspecified: Secondary | ICD-10-CM | POA: Diagnosis present

## 2016-10-29 DIAGNOSIS — D539 Nutritional anemia, unspecified: Secondary | ICD-10-CM | POA: Diagnosis present

## 2016-10-29 DIAGNOSIS — I5033 Acute on chronic diastolic (congestive) heart failure: Secondary | ICD-10-CM

## 2016-10-29 DIAGNOSIS — R5381 Other malaise: Secondary | ICD-10-CM | POA: Diagnosis present

## 2016-10-29 DIAGNOSIS — J9811 Atelectasis: Secondary | ICD-10-CM | POA: Diagnosis present

## 2016-10-29 DIAGNOSIS — Z87891 Personal history of nicotine dependence: Secondary | ICD-10-CM

## 2016-10-29 DIAGNOSIS — F1010 Alcohol abuse, uncomplicated: Secondary | ICD-10-CM | POA: Diagnosis present

## 2016-10-29 DIAGNOSIS — Z66 Do not resuscitate: Secondary | ICD-10-CM | POA: Diagnosis present

## 2016-10-29 DIAGNOSIS — J9601 Acute respiratory failure with hypoxia: Secondary | ICD-10-CM | POA: Diagnosis present

## 2016-10-29 DIAGNOSIS — Z79899 Other long term (current) drug therapy: Secondary | ICD-10-CM

## 2016-10-29 DIAGNOSIS — I472 Ventricular tachycardia, unspecified: Secondary | ICD-10-CM

## 2016-10-29 DIAGNOSIS — I5043 Acute on chronic combined systolic (congestive) and diastolic (congestive) heart failure: Secondary | ICD-10-CM | POA: Diagnosis present

## 2016-10-29 DIAGNOSIS — W19XXXA Unspecified fall, initial encounter: Secondary | ICD-10-CM | POA: Diagnosis present

## 2016-10-29 DIAGNOSIS — Z825 Family history of asthma and other chronic lower respiratory diseases: Secondary | ICD-10-CM

## 2016-10-29 DIAGNOSIS — I110 Hypertensive heart disease with heart failure: Secondary | ICD-10-CM

## 2016-10-29 DIAGNOSIS — Z8249 Family history of ischemic heart disease and other diseases of the circulatory system: Secondary | ICD-10-CM

## 2016-10-29 DIAGNOSIS — I272 Pulmonary hypertension, unspecified: Secondary | ICD-10-CM | POA: Diagnosis present

## 2016-10-29 DIAGNOSIS — Z6838 Body mass index (BMI) 38.0-38.9, adult: Secondary | ICD-10-CM

## 2016-10-29 DIAGNOSIS — I428 Other cardiomyopathies: Secondary | ICD-10-CM | POA: Diagnosis present

## 2016-10-29 DIAGNOSIS — R4781 Slurred speech: Secondary | ICD-10-CM | POA: Diagnosis present

## 2016-10-29 DIAGNOSIS — I429 Cardiomyopathy, unspecified: Secondary | ICD-10-CM | POA: Diagnosis present

## 2016-10-29 DIAGNOSIS — Z7901 Long term (current) use of anticoagulants: Secondary | ICD-10-CM

## 2016-10-29 DIAGNOSIS — E785 Hyperlipidemia, unspecified: Secondary | ICD-10-CM | POA: Diagnosis present

## 2016-10-29 LAB — COMPREHENSIVE METABOLIC PANEL
ALBUMIN: 3.2 g/dL — AB (ref 3.5–5.0)
ALK PHOS: 138 U/L — AB (ref 38–126)
ALT: 12 U/L — AB (ref 17–63)
ANION GAP: 6 (ref 5–15)
AST: 25 U/L (ref 15–41)
BILIRUBIN TOTAL: 1.5 mg/dL — AB (ref 0.3–1.2)
BUN: 25 mg/dL — AB (ref 6–20)
CALCIUM: 8.7 mg/dL — AB (ref 8.9–10.3)
CO2: 27 mmol/L (ref 22–32)
CREATININE: 1.08 mg/dL (ref 0.61–1.24)
Chloride: 111 mmol/L (ref 101–111)
GFR calc Af Amer: >60 mL/min (ref >60)
GFR calc non Af Amer: >60 mL/min (ref >60)
GLUCOSE: 96 mg/dL (ref 65–99)
Potassium: 4.3 mmol/L (ref 3.5–5.1)
SODIUM: 144 mmol/L (ref 135–145)
TOTAL PROTEIN: 7 g/dL (ref 6.5–8.1)

## 2016-10-29 LAB — CBC WITH DIFFERENTIAL/PLATELET
BASOS PCT: 0 %
Basophils Absolute: 0 10*3/uL (ref 0.0–0.1)
Eosinophils Absolute: 0.1 10*3/uL (ref 0.0–0.7)
Eosinophils Relative: 2 %
HEMATOCRIT: 34.4 % — AB (ref 39.0–52.0)
HEMOGLOBIN: 11 g/dL — AB (ref 13.0–17.0)
LYMPHS PCT: 14 %
Lymphs Abs: 0.7 10*3/uL (ref 0.7–4.0)
MCH: 34.2 pg — ABNORMAL HIGH (ref 26.0–34.0)
MCHC: 32 g/dL (ref 30.0–36.0)
MCV: 106.8 fL — ABNORMAL HIGH (ref 78.0–100.0)
MONOS PCT: 7 %
Monocytes Absolute: 0.4 10*3/uL (ref 0.1–1.0)
Neutro Abs: 3.9 10*3/uL (ref 1.7–7.7)
Neutrophils Relative %: 77 %
Platelets: 179 10*3/uL (ref 150–400)
RBC: 3.22 MIL/uL — AB (ref 4.22–5.81)
RDW: 15.8 % — ABNORMAL HIGH (ref 11.5–15.5)
WBC: 5 10*3/uL (ref 4.0–10.5)

## 2016-10-29 LAB — PROTIME-INR
INR: 2.4
PROTHROMBIN TIME: 26.6 s — AB (ref 11.4–15.2)

## 2016-10-29 LAB — I-STAT CG4 LACTIC ACID, ED: Lactic Acid, Venous: 1.24 mmol/L (ref 0.5–1.9)

## 2016-10-29 LAB — I-STAT TROPONIN, ED: Troponin i, poc: 0.02 ng/mL (ref 0.00–0.08)

## 2016-10-29 NOTE — ED Triage Notes (Signed)
Per EMS, pt slid from bed to floor and landed on his bottom.  Pt denies pain except in his right leg, which is chronic.  There are no obvious deformities.  Pt is on thinners but denies hitting head or losing consciousness.  Pt is A&O x 4 and speaking in full sentences. Pt is ambulatory. Per EMS, pt smells of urine and the home is very dirty.  EMS states that pt was 88% on room air, applied 2 liters [place] and pt came up to 97%.

## 2016-10-29 NOTE — ED Provider Notes (Signed)
[facility]-EMERGENCY DEPT Provider Note   CSN: [id number] Arrival date & time: 10/29/16  2236     History   Chief Complaint Chief Complaint  Patient presents with  . Fall    HPI [name] is a 71 y.o. male.  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 Pt states he has had productive cough for several weeks, with generalized weakness. Earlier today, the pt was trying to get out of bed when his legs felt weak, along with right knee pain, and he slid to the floor. He was unable to get out of the floor so called EMS. He reports general fatigue, LE edema, and orthopnea. No fevers. No chest pain. No palpitations. Denies any head injury or LOC, and he slid down from the bed.  Past Medical History:  Diagnosis Date  . Alcoholism (HCC)   . Anemia   . Atrial fibrillation or flutter   . Cardiomyopathy    nonischemic  . CHF (congestive heart failure) (HCC)    ejection fractio of 45% per echo in 2011 (previos EF 35-40% in 2003), echo 04/2013  -LVEF 50-55%.  . Complication of anesthesia    April 11, 2013  . GERD (gastroesophageal reflux disease)    takes tums prn  . Hypertension   . Hypoxemia   . Obesity   . Ventricular tachycardia (HCC)    nonsustained, asymptomatic    Patient Active Problem List   Diagnosis Date Noted  . Bradycardia 03/16/2016  . Alcohol abuse 03/16/2016  . Acute encephalopathy 03/16/2016  . Alcohol withdrawal delirium (HCC) 03/16/2016  . Cor, pulmonale, acute (HCC)   . CAP (community acquired pneumonia) 03/06/2016  . Weakness of both lower extremities 03/04/2016  . Morbid obesity - BMI 45 10/23/2015  . Chronic anticoagulation 10/23/2015  . 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 Diastolic dysfunction with acute on chronic heart failure (HCC) 10/23/2015  . 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 Recurrent falls 10/20/2015  . Physical deconditioning 10/20/2015  . Hyperkalemia 10/20/2015  . Fall   .  Streptococcus viridans infection 02/13/2015  . Sepsis (HCC) 02/07/2015  . Anemia of chronic disease 02/07/2015  . Alcoholic hepatitis 02/07/2015  . Thrombocytopenia (HCC) 02/07/2015  . Elevated troponin 02/06/2015  . Hematoma 04/24/2013  . Chronic atrial fibrillation (HCC) 04/24/2013  . Acute respiratory failure with hypoxia (HCC) 04/23/2013  . Cellulitis 04/23/2013  . Essential hypertension 12/06/2009    Past Surgical History:  Procedure Laterality Date  . I&D EXTREMITY Right 06/28/2013   Procedure: IRRIGATION AND DEBRIDEMENT OF RIGHT LEG WITH SURGICAL PREP/PLACEMENT OF ACELL ;  Surgeon: Wayland Denis, DO;  Location: [facility] OR;  Service: Plastics;  Laterality: Right;  . KNEE BURSECTOMY Right 04/20/2013   Procedure: Right knee prepatella bursa decompression;  Surgeon: Cammy Copa, MD;  Location: [facility] OR;  Service: Orthopedics;  Laterality: Right;  . TONSILLECTOMY         Home Medications    Prior to Admission medications   Medication Sig Start Date End Date Taking? Authorizing Provider  acetaminophen (TYLENOL) 325 MG tablet Take 2 tablets (650 mg total) by mouth every 6 (six) hours as needed for mild pain, moderate pain, fever or headache (or Fever >/= 101). 10/25/15   Elease Etienne, MD  allopurinol (ZYLOPRIM) 100 MG tablet Take 100 mg by mouth daily.  01/14/16   Historical Provider, MD  Amino Acids-Protein Hydrolys (FEEDING SUPPLEMENT, PRO-STAT SUGAR  FREE 64,) LIQD Take 30 mLs by mouth 2 (two) times daily. Patient not taking: Reported on 08/27/2016 10/25/15   Elease Etienne, MD  feeding supplement, ENSURE ENLIVE, (ENSURE ENLIVE) LIQD Take 237 mLs by mouth 2 (two) times daily between meals. 10/25/15   Elease Etienne, MD  furosemide (LASIX) 20 MG tablet Take 1 tablet (20 mg total) by mouth daily. At 4 PM Patient not taking: Reported on 08/27/2016 03/16/16   Calvert Cantor, MD  furosemide (LASIX) 40 MG tablet Take 1 tablet (40 mg total) by mouth daily. 03/16/16   Calvert Cantor, MD    polyethylene glycol (MIRALAX / GLYCOLAX) packet Take 17 g by mouth daily. Patient not taking: Reported on 08/27/2016 10/25/15   Elease Etienne, MD  potassium chloride SA (K-DUR,KLOR-CON) 20 MEQ tablet Take 1 tablet (20 mEq total) by mouth daily. 10/25/15   Elease Etienne, MD  pravastatin (PRAVACHOL) 10 MG tablet Take 10 mg by mouth daily.    Historical Provider, MD  QUEtiapine (SEROQUEL) 25 MG tablet Take 1 tablet (25 mg total) by mouth 2 (two) times daily. 03/16/16   Calvert Cantor, MD  senna-docusate (SENOKOT-S) 8.6-50 MG tablet Take 2 tablets by mouth at bedtime as needed for mild constipation or moderate constipation. 10/25/15   Elease Etienne, MD  warfarin (COUMADIN) 5 MG tablet Take 0.5 tablets (2.5 mg total) by mouth daily at 6 PM. Patient taking differently: Take 2.5-5 mg by mouth as directed. Take 1 tablet (5 mg) on Sun, Tues, Thurs, Sat and Take 0.5 tablet (2.5 mg) on MWF 10/26/15   Elease Etienne, MD    Family History Family History  Problem Relation Age of Onset  . COPD Mother   . Hypertension Father     Social History Social History  Substance Use Topics  . Smoking status: Former Smoker    Types: Cigarettes    Start date: 06/29/1987  . Smokeless tobacco: Never Used     Comment: stopped in 1985  . Alcohol use 6.0 oz/week    10 Cans of beer per week     Comment: 03/04/16 - "I have not been drunk in 20 years. I can take it or leave it. "     Allergies   Patient has no known allergies.   Review of Systems Review of Systems  Constitutional: Positive for fatigue. Negative for chills and fever.  HENT: Negative for congestion and rhinorrhea.   Eyes: Negative for visual disturbance.  Respiratory: Positive for cough and shortness of breath. Negative for wheezing.   Cardiovascular: Positive for leg swelling. Negative for chest pain.  Gastrointestinal: Negative for abdominal pain, diarrhea, nausea and vomiting.  Genitourinary: Negative for dysuria and flank pain.   Musculoskeletal: Positive for arthralgias and gait problem. Negative for neck pain and neck stiffness.  Skin: Negative for rash and wound.  Allergic/Immunologic: Negative for immunocompromised state.  Neurological: Positive for weakness. Negative for syncope and headaches.  All other systems reviewed and are negative.    Physical Exam Updated Vital Signs BP 114/69 (BP Location: Right Arm)   Pulse 73   Temp 97.4 F (36.3 C) (Oral)   Resp 18   Ht 6\' 2"  (1.88 m)   Wt 300 lb (136.1 kg)   SpO2 96%   BMI 38.52 kg/m   Physical Exam  Constitutional: He is oriented to person, place, and time. He appears well-developed. No distress.  Disheveled  HENT:  Head: Normocephalic and atraumatic.  Eyes: Conjunctivae are normal.  Neck:  Neck supple.  Cardiovascular: Normal rate, regular rhythm and normal heart sounds.  Exam reveals no friction rub.   No murmur heard. Pulmonary/Chest: Effort normal. No respiratory distress. He has no wheezes. He has rales (bibasilar).  Abdominal: Soft. He exhibits no distension.  Musculoskeletal: He exhibits edema (2+ b/l LE) and tenderness (Mild TTP over right anterior knee, with no deformity or bruising; no ligamentous laxity). He exhibits no deformity.  Neurological: He is alert and oriented to person, place, and time. He exhibits normal muscle tone.  Skin: Skin is warm. Capillary refill takes less than 2 seconds.  Chronic stasis changes to b/l lower legs  Psychiatric: He has a normal mood and affect.  Nursing note and vitals reviewed.    ED Treatments / Results  Labs (all labs ordered are listed, but only abnormal results are displayed) Labs Reviewed - No data to display  EKG  EKG Interpretation None       Radiology No results found.  Procedures Procedures (including critical care time)  Medications Ordered in ED Medications - No data to display   Initial Impression / Assessment and Plan / ED Course  I have reviewed the triage vital  signs and the nursing notes.  Pertinent labs & imaging results that were available during my care of the patient were reviewed by me and considered in my medical decision making (see chart for details).     71 yo M with PMHx as above here with cough, hypoxia, and SOB. On arrival, pt hypoxic to 87% on RA. Not on home O2 (he was previously, though). Exam as above, with b/l rales. Plain films of knee neg and suspect fall was 2/2 deconditioning, general fatigue likely 2/2 CHF exacerbation. CXR confirms b/l pulmonary edema and cardiomegaly. Pt persistently hypoxic here - will admit for lasix.  Final Clinical Impressions(s) / ED Diagnoses   Final diagnoses:  Acute respiratory failure with hypoxia (HCC)  Acute on chronic systolic congestive heart failure [facility])    New Prescriptions New Prescriptions   No medications on file     Shaune Pollack, MD 10/30/16 1150

## 2016-10-29 NOTE — ED Notes (Signed)
[phone]  -  Pt's brother, Gene Zorn.  Brother called to inform care team that the patient does not have adequate care at home and that their 71 year old father cannot keep taking care of him.  Brother states that the patient's health improved after going to rehab and [facility] and requested a social work consult to see if he could go back.

## 2016-10-29 NOTE — ED Notes (Signed)
Bed: XN23 Expected date:  Expected time:  Means of arrival:  Comments: 71 yr old falls, leg pain

## 2016-10-30 ENCOUNTER — Inpatient Hospital Stay (HOSPITAL_COMMUNITY): Payer: Medicare HMO

## 2016-10-30 DIAGNOSIS — Z87891 Personal history of nicotine dependence: Secondary | ICD-10-CM | POA: Diagnosis not present

## 2016-10-30 DIAGNOSIS — I482 Chronic atrial fibrillation: Secondary | ICD-10-CM | POA: Diagnosis not present

## 2016-10-30 DIAGNOSIS — I5033 Acute on chronic diastolic (congestive) heart failure: Secondary | ICD-10-CM | POA: Diagnosis not present

## 2016-10-30 DIAGNOSIS — R0902 Hypoxemia: Secondary | ICD-10-CM | POA: Diagnosis present

## 2016-10-30 DIAGNOSIS — I509 Heart failure, unspecified: Secondary | ICD-10-CM

## 2016-10-30 DIAGNOSIS — I5023 Acute on chronic systolic (congestive) heart failure: Secondary | ICD-10-CM | POA: Diagnosis not present

## 2016-10-30 DIAGNOSIS — D649 Anemia, unspecified: Secondary | ICD-10-CM | POA: Diagnosis not present

## 2016-10-30 DIAGNOSIS — I472 Ventricular tachycardia: Secondary | ICD-10-CM | POA: Diagnosis not present

## 2016-10-30 DIAGNOSIS — Z8249 Family history of ischemic heart disease and other diseases of the circulatory system: Secondary | ICD-10-CM | POA: Diagnosis not present

## 2016-10-30 DIAGNOSIS — J9601 Acute respiratory failure with hypoxia: Secondary | ICD-10-CM | POA: Diagnosis present

## 2016-10-30 DIAGNOSIS — Z7901 Long term (current) use of anticoagulants: Secondary | ICD-10-CM | POA: Diagnosis not present

## 2016-10-30 DIAGNOSIS — M109 Gout, unspecified: Secondary | ICD-10-CM | POA: Diagnosis present

## 2016-10-30 DIAGNOSIS — I5043 Acute on chronic combined systolic (congestive) and diastolic (congestive) heart failure: Secondary | ICD-10-CM | POA: Diagnosis present

## 2016-10-30 DIAGNOSIS — Z825 Family history of asthma and other chronic lower respiratory diseases: Secondary | ICD-10-CM | POA: Diagnosis not present

## 2016-10-30 DIAGNOSIS — K219 Gastro-esophageal reflux disease without esophagitis: Secondary | ICD-10-CM | POA: Diagnosis present

## 2016-10-30 DIAGNOSIS — E785 Hyperlipidemia, unspecified: Secondary | ICD-10-CM | POA: Diagnosis present

## 2016-10-30 DIAGNOSIS — I272 Pulmonary hypertension, unspecified: Secondary | ICD-10-CM | POA: Diagnosis present

## 2016-10-30 DIAGNOSIS — J9811 Atelectasis: Secondary | ICD-10-CM | POA: Diagnosis present

## 2016-10-30 DIAGNOSIS — F1010 Alcohol abuse, uncomplicated: Secondary | ICD-10-CM | POA: Diagnosis present

## 2016-10-30 DIAGNOSIS — R4781 Slurred speech: Secondary | ICD-10-CM | POA: Diagnosis present

## 2016-10-30 DIAGNOSIS — W19XXXA Unspecified fall, initial encounter: Secondary | ICD-10-CM | POA: Diagnosis not present

## 2016-10-30 DIAGNOSIS — D539 Nutritional anemia, unspecified: Secondary | ICD-10-CM | POA: Diagnosis present

## 2016-10-30 DIAGNOSIS — Z79899 Other long term (current) drug therapy: Secondary | ICD-10-CM | POA: Diagnosis not present

## 2016-10-30 DIAGNOSIS — I428 Other cardiomyopathies: Secondary | ICD-10-CM | POA: Diagnosis present

## 2016-10-30 DIAGNOSIS — I110 Hypertensive heart disease with heart failure: Secondary | ICD-10-CM | POA: Diagnosis not present

## 2016-10-30 DIAGNOSIS — Z66 Do not resuscitate: Secondary | ICD-10-CM | POA: Diagnosis present

## 2016-10-30 DIAGNOSIS — I429 Cardiomyopathy, unspecified: Secondary | ICD-10-CM | POA: Diagnosis present

## 2016-10-30 DIAGNOSIS — Z6838 Body mass index (BMI) 38.0-38.9, adult: Secondary | ICD-10-CM | POA: Diagnosis not present

## 2016-10-30 LAB — COMPREHENSIVE METABOLIC PANEL
ALBUMIN: 3.2 g/dL — AB (ref 3.5–5.0)
ALK PHOS: 141 U/L — AB (ref 38–126)
ALT: 12 U/L — ABNORMAL LOW (ref 17–63)
AST: 27 U/L (ref 15–41)
Anion gap: 11 (ref 5–15)
BUN: 25 mg/dL — ABNORMAL HIGH (ref 6–20)
CALCIUM: 8.7 mg/dL — AB (ref 8.9–10.3)
CHLORIDE: 111 mmol/L (ref 101–111)
CO2: 22 mmol/L (ref 22–32)
Creatinine, Ser: 1.04 mg/dL (ref 0.61–1.24)
GFR calc non Af Amer: >60 mL/min (ref >60)
Glucose, Bld: 78 mg/dL (ref 65–99)
POTASSIUM: 4.4 mmol/L (ref 3.5–5.1)
SODIUM: 144 mmol/L (ref 135–145)
Total Bilirubin: 1.3 mg/dL — ABNORMAL HIGH (ref 0.3–1.2)
Total Protein: 7.4 g/dL (ref 6.5–8.1)

## 2016-10-30 LAB — URINALYSIS, ROUTINE W REFLEX MICROSCOPIC
BILIRUBIN URINE: NEGATIVE
Glucose, UA: NEGATIVE mg/dL
KETONES UR: NEGATIVE mg/dL
Nitrite: NEGATIVE
PH: 5 (ref 5.0–8.0)
PROTEIN: NEGATIVE mg/dL
Specific Gravity, Urine: 1.006 (ref 1.005–1.030)

## 2016-10-30 LAB — ECHOCARDIOGRAM COMPLETE
Height: 73 in
WEIGHTICAEL: 4698.44 [oz_av]

## 2016-10-30 LAB — CBC WITH DIFFERENTIAL/PLATELET
BASOS ABS: 0 10*3/uL (ref 0.0–0.1)
Basophils Relative: 0 %
EOS ABS: 0.1 10*3/uL (ref 0.0–0.7)
EOS PCT: 2 %
HCT: 35.5 % — ABNORMAL LOW (ref 39.0–52.0)
Hemoglobin: 11.4 g/dL — ABNORMAL LOW (ref 13.0–17.0)
Lymphocytes Relative: 18 %
Lymphs Abs: 0.9 10*3/uL (ref 0.7–4.0)
MCH: 34.4 pg — AB (ref 26.0–34.0)
MCHC: 32.1 g/dL (ref 30.0–36.0)
MCV: 107.3 fL — ABNORMAL HIGH (ref 78.0–100.0)
Monocytes Absolute: 0.4 10*3/uL (ref 0.1–1.0)
Monocytes Relative: 8 %
Neutro Abs: 3.6 10*3/uL (ref 1.7–7.7)
Neutrophils Relative %: 72 %
Platelets: 186 10*3/uL (ref 150–400)
RBC: 3.31 MIL/uL — AB (ref 4.22–5.81)
RDW: 15.9 % — ABNORMAL HIGH (ref 11.5–15.5)
WBC: 5 10*3/uL (ref 4.0–10.5)

## 2016-10-30 LAB — ETHANOL: Alcohol, Ethyl (B): <5 mg/dL (ref <5)

## 2016-10-30 LAB — TROPONIN I

## 2016-10-30 LAB — MRSA PCR SCREENING: MRSA BY PCR: POSITIVE — AB

## 2016-10-30 LAB — PROTIME-INR
INR: 2.33
Prothrombin Time: 26 seconds — ABNORMAL HIGH (ref 11.4–15.2)

## 2016-10-30 LAB — VITAMIN B12: VITAMIN B 12: 722 pg/mL (ref 180–914)

## 2016-10-30 LAB — BRAIN NATRIURETIC PEPTIDE: B NATRIURETIC PEPTIDE 5: 594.5 pg/mL — AB (ref 0.0–100.0)

## 2016-10-30 LAB — TSH: TSH: 2.275 u[IU]/mL (ref 0.350–4.500)

## 2016-10-30 MED ORDER — ONDANSETRON HCL 4 MG/2ML IJ SOLN: 4.0000 mg | Freq: Four times a day (QID) | INTRAMUSCULAR | Status: DC | PRN

## 2016-10-30 MED ORDER — FUROSEMIDE 10 MG/ML IJ SOLN
80.0000 mg | Freq: Two times a day (BID) | INTRAMUSCULAR | Status: DC
  Administered 2016-10-30 – 2016-11-03 (×8): 80 mg via INTRAVENOUS
  Filled 2016-10-30 (×8): qty 8

## 2016-10-30 MED ORDER — ADULT MULTIVITAMIN W/MINERALS CH
1.0000 | ORAL_TABLET | Freq: Every day | ORAL | Status: DC
  Administered 2016-10-30 – 2016-11-04 (×6): 1 via ORAL
  Filled 2016-10-30 (×6): qty 1

## 2016-10-30 MED ORDER — LORAZEPAM 2 MG/ML IJ SOLN: 1.0000 mg | Freq: Four times a day (QID) | INTRAMUSCULAR | Status: AC | PRN

## 2016-10-30 MED ORDER — CHLORHEXIDINE GLUCONATE 0.12 % MT SOLN
15.0000 mL | Freq: Two times a day (BID) | OROMUCOSAL | Status: DC
  Administered 2016-10-30 – 2016-11-04 (×8): 15 mL via OROMUCOSAL
  Filled 2016-10-30 (×11): qty 15

## 2016-10-30 MED ORDER — SODIUM CHLORIDE 0.9 % IV SOLN: 250.0000 mL | INTRAVENOUS | Status: DC | PRN

## 2016-10-30 MED ORDER — WARFARIN - PHARMACIST DOSING INPATIENT: Freq: Every day | Status: DC

## 2016-10-30 MED ORDER — GUAIFENESIN ER 600 MG PO TB12
600.0000 mg | ORAL_TABLET | Freq: Two times a day (BID) | ORAL | Status: DC
  Administered 2016-10-30 – 2016-11-04 (×12): 600 mg via ORAL
  Filled 2016-10-30 (×12): qty 1

## 2016-10-30 MED ORDER — WARFARIN SODIUM 2.5 MG PO TABS
2.5000 mg | ORAL_TABLET | Freq: Once | ORAL | Status: AC
  Administered 2016-10-30: 2.5 mg via ORAL
  Filled 2016-10-30: qty 1

## 2016-10-30 MED ORDER — ENSURE ENLIVE PO LIQD
237.0000 mL | Freq: Two times a day (BID) | ORAL | Status: DC
  Administered 2016-10-30 – 2016-11-04 (×9): 237 mL via ORAL

## 2016-10-30 MED ORDER — PERFLUTREN LIPID MICROSPHERE
INTRAVENOUS | Status: AC
  Filled 2016-10-30: qty 10

## 2016-10-30 MED ORDER — FUROSEMIDE 10 MG/ML IJ SOLN
20.0000 mg | Freq: Once | INTRAMUSCULAR | Status: AC
  Administered 2016-10-30: 10 mg via INTRAVENOUS
  Filled 2016-10-30: qty 4

## 2016-10-30 MED ORDER — THIAMINE HCL 100 MG/ML IJ SOLN: 100.0000 mg | Freq: Every day | INTRAMUSCULAR | Status: DC

## 2016-10-30 MED ORDER — SODIUM CHLORIDE 0.9% FLUSH
3.0000 mL | Freq: Two times a day (BID) | INTRAVENOUS | Status: DC
  Administered 2016-10-30 – 2016-11-04 (×12): 3 mL via INTRAVENOUS

## 2016-10-30 MED ORDER — POTASSIUM CHLORIDE CRYS ER 20 MEQ PO TBCR
20.0000 meq | EXTENDED_RELEASE_TABLET | Freq: Every day | ORAL | Status: DC
  Administered 2016-10-30 – 2016-11-04 (×6): 20 meq via ORAL
  Filled 2016-10-30 (×6): qty 1

## 2016-10-30 MED ORDER — FUROSEMIDE 10 MG/ML IJ SOLN
40.0000 mg | Freq: Once | INTRAMUSCULAR | Status: AC
  Administered 2016-10-30: 40 mg via INTRAVENOUS
  Filled 2016-10-30: qty 4

## 2016-10-30 MED ORDER — FOLIC ACID 1 MG PO TABS
1.0000 mg | ORAL_TABLET | Freq: Every day | ORAL | Status: DC
  Administered 2016-10-30 – 2016-11-04 (×6): 1 mg via ORAL
  Filled 2016-10-30 (×6): qty 1

## 2016-10-30 MED ORDER — ORAL CARE MOUTH RINSE: 15.0000 mL | Freq: Two times a day (BID) | OROMUCOSAL | Status: DC

## 2016-10-30 MED ORDER — ACETAMINOPHEN 325 MG PO TABS
650.0000 mg | ORAL_TABLET | ORAL | Status: DC | PRN
  Administered 2016-10-31 – 2016-11-03 (×4): 650 mg via ORAL
  Filled 2016-10-30 (×4): qty 2

## 2016-10-30 MED ORDER — FUROSEMIDE 10 MG/ML IJ SOLN: 80.0000 mg | Freq: Two times a day (BID) | INTRAMUSCULAR | Status: DC

## 2016-10-30 MED ORDER — LORAZEPAM 1 MG PO TABS
1.0000 mg | ORAL_TABLET | Freq: Four times a day (QID) | ORAL | Status: AC | PRN
  Administered 2016-11-01: 1 mg via ORAL
  Filled 2016-10-30: qty 1

## 2016-10-30 MED ORDER — SODIUM CHLORIDE 0.9% FLUSH: 3.0000 mL | INTRAVENOUS | Status: DC | PRN

## 2016-10-30 MED ORDER — PRAVASTATIN SODIUM 20 MG PO TABS
10.0000 mg | ORAL_TABLET | Freq: Every day | ORAL | Status: DC
  Administered 2016-10-30 – 2016-11-04 (×6): 10 mg via ORAL
  Filled 2016-10-30 (×6): qty 1

## 2016-10-30 MED ORDER — ALLOPURINOL 100 MG PO TABS
100.0000 mg | ORAL_TABLET | Freq: Every day | ORAL | Status: DC
  Administered 2016-10-30 – 2016-11-04 (×6): 100 mg via ORAL
  Filled 2016-10-30 (×6): qty 1

## 2016-10-30 MED ORDER — WARFARIN SODIUM 5 MG PO TABS
5.0000 mg | ORAL_TABLET | ORAL | Status: AC
  Administered 2016-10-30: 5 mg via ORAL
  Filled 2016-10-30 (×2): qty 1

## 2016-10-30 MED ORDER — IPRATROPIUM-ALBUTEROL 0.5-2.5 (3) MG/3ML IN SOLN: 3.0000 mL | RESPIRATORY_TRACT | Status: DC | PRN

## 2016-10-30 MED ORDER — PERFLUTREN LIPID MICROSPHERE
1.0000 mL | INTRAVENOUS | Status: AC | PRN
  Administered 2016-10-30: 3 mL via INTRAVENOUS

## 2016-10-30 MED ORDER — VITAMIN B-1 100 MG PO TABS
100.0000 mg | ORAL_TABLET | Freq: Every day | ORAL | Status: DC
  Administered 2016-10-30 – 2016-11-04 (×6): 100 mg via ORAL
  Filled 2016-10-30 (×6): qty 1

## 2016-10-30 MED ORDER — FUROSEMIDE 10 MG/ML IJ SOLN
40.0000 mg | Freq: Two times a day (BID) | INTRAMUSCULAR | Status: DC
  Administered 2016-10-30: 40 mg via INTRAVENOUS
  Filled 2016-10-30: qty 4

## 2016-10-30 NOTE — Progress Notes (Signed)
Pt had 6 beats of v tach  Earlier today.  Rn reports 11 beats at 6:50p   Pt reports he feels fine.  No complaints.  Pt is DNR.  Vitals stable Lungs clear Heart rrr,  AFib on monitor Strips reviewed.  Continue to monitor

## 2016-10-30 NOTE — Consult Note (Signed)
Patient ID: [name] MRN: [id number], DOB/AGE: 1946-02-12   Admit date: 10/29/2016   Reason for Consult: CHF Requesting MD: Dr. Madelyn Flavors, Internal Medicine   Primary Physician: [facility] Primary Cardiologist: Dr. Antoine Poche   Pt. Profile:  71 y/o male, followed by Dr. Antoine Poche, with a prior h/o NICM LVEF (55-60% in 2017, previously 35-40% in 2003), diastolic dysfunction, chronic atrial fibrillation on Coumadin, obesity and HTN, who presented to ED for bilateral leg weakness and subsequent fall and found to be in acute CHF.    Problem List  Past Medical History:  Diagnosis Date  . Alcoholism (HCC)   . Anemia   . Atrial fibrillation or flutter   . Cardiomyopathy    nonischemic  . CHF (congestive heart failure) (HCC)    ejection fractio of 45% per echo in 2011 (previos EF 35-40% in 2003), echo 04/2013  -LVEF 50-55%.  . Complication of anesthesia    April 11, 2013  . GERD (gastroesophageal reflux disease)    takes tums prn  . Hypertension   . Hypoxemia   . Obesity   . Ventricular tachycardia (HCC)    nonsustained, asymptomatic    Past Surgical History:  Procedure Laterality Date  . I&D EXTREMITY Right 06/28/2013   Procedure: IRRIGATION AND DEBRIDEMENT OF RIGHT LEG WITH SURGICAL PREP/PLACEMENT OF ACELL ;  Surgeon: Wayland Denis, DO;  Location: [facility] OR;  Service: Plastics;  Laterality: Right;  . KNEE BURSECTOMY Right 04/20/2013   Procedure: Right knee prepatella bursa decompression;  Surgeon: Cammy Copa, MD;  Location: [facility] OR;  Service: Orthopedics;  Laterality: Right;  . TONSILLECTOMY       Allergies  No Known Allergies  HPI  71 y/o male, followed by Dr. Antoine Poche, with a prior h/o NICM LVEF (55-60% in 2017, previously 35-40% in 2003), diastolic dysfunction, chronic atrial fibrillation on Coumadin, obesity and HTN. Last seen in clinic 06/2015. Office weight was 294 lb at that time.  He presented to the [facility] ED on 10/30/16  after sustaining a fall at home. Pt notes his legs were weak, resulting in him to fall. He denies any LOC. No head trauma. He was transported to the ED by EMS. On arrival, he was noted to be afebrile with respirations of 23,  and O2 saturations as low as 88% on room air. Lab work revealed hemoglobin 11, BNP 594.5, and troponin 0.02. Chest x-ray showed cardiomegaly with CHF and probable small left pleural effusion. X-ray imaging of the right leg show no acute fractures. [facility] was called to admit for acute CHF. He was given a dose of IV Lasix in the ED. Repeat 2D echo pending. Cardiology consulted to assist with management of his CHF.   Since admission, he has had 1 other troponin that is negative. He has received only 1 dose of IV lasix thus far. Total UOP recorded at 500cc. BP is stable. Renal function stable with SCr at 1.08. K 4.3. INR therapeutic at 2.33.   He reports that he feels well today. He denies any recent dyspnea. He ambulates with a walker and denies exertional dyspnea. He sleeps with 2 pillow and has been for years. He denies orthopnea/ PND. No chest pain. He is asymptomatic with his afib. He reports full medication compliance at home. His weight today is 293 lb. His last documented office weight in 2016 was 294.   Home Medications  Prior to Admission medications   Medication Sig Start Date End Date Taking? Authorizing  Provider  allopurinol (ZYLOPRIM) 100 MG tablet Take 100 mg by mouth daily.  01/14/16  Yes Historical Provider, MD  feeding supplement, ENSURE ENLIVE, (ENSURE ENLIVE) LIQD Take 237 mLs by mouth 2 (two) times daily between meals. 10/25/15  Yes Elease Etienne, MD  furosemide (LASIX) 40 MG tablet Take 1 tablet (40 mg total) by mouth daily. 03/16/16  Yes Calvert Cantor, MD  Multiple Vitamin (MULTIVITAMIN WITH MINERALS) TABS tablet Take 1 tablet by mouth daily.   Yes Historical Provider, MD  potassium chloride SA (K-DUR,KLOR-CON) 20 MEQ tablet Take 1 tablet (20 mEq total) by mouth  daily. 10/25/15  Yes Elease Etienne, MD  pravastatin (PRAVACHOL) 10 MG tablet Take 10 mg by mouth daily.   Yes Historical Provider, MD  warfarin (COUMADIN) 5 MG tablet Take 0.5 tablets (2.5 mg total) by mouth daily at 6 PM. Patient taking differently: Take 2.5-5 mg by mouth as directed. Take 1 tablet (5 mg) on Sun, Tues, Thurs, Sat and Take 0.5 tablet (2.5 mg) on MWF 10/26/15  Yes Elease Etienne, MD  acetaminophen (TYLENOL) 325 MG tablet Take 2 tablets (650 mg total) by mouth every 6 (six) hours as needed for mild pain, moderate pain, fever or headache (or Fever >/= 101). Patient not taking: Reported on 10/29/2016 10/25/15   Elease Etienne, MD  Amino Acids-Protein Hydrolys (FEEDING SUPPLEMENT, PRO-STAT SUGAR FREE 64,) LIQD Take 30 mLs by mouth 2 (two) times daily. Patient not taking: Reported on 08/27/2016 10/25/15   Elease Etienne, MD  furosemide (LASIX) 20 MG tablet Take 1 tablet (20 mg total) by mouth daily. At 4 PM Patient not taking: Reported on 08/27/2016 03/16/16   Calvert Cantor, MD  polyethylene glycol (MIRALAX / GLYCOLAX) packet Take 17 g by mouth daily. Patient not taking: Reported on 08/27/2016 10/25/15   Elease Etienne, MD  QUEtiapine (SEROQUEL) 25 MG tablet Take 1 tablet (25 mg total) by mouth 2 (two) times daily. Patient not taking: Reported on 10/29/2016 03/16/16   Calvert Cantor, MD  senna-docusate (SENOKOT-S) 8.6-50 MG tablet Take 2 tablets by mouth at bedtime as needed for mild constipation or moderate constipation. Patient not taking: Reported on 10/29/2016 10/25/15   Elease Etienne, MD   Hospital Meds  . allopurinol  100 mg Oral Daily  . chlorhexidine  15 mL Mouth Rinse BID  . feeding supplement (ENSURE ENLIVE)  237 mL Oral BID BM  . folic acid  1 mg Oral Daily  . furosemide  40 mg Intravenous Q12H  . guaiFENesin  600 mg Oral BID  . multivitamin with minerals  1 tablet Oral Daily  . potassium chloride SA  20 mEq Oral Daily  . pravastatin  10 mg Oral Daily  . sodium chloride  flush  3 mL Intravenous Q12H  . thiamine  100 mg Oral Daily   Or  . thiamine  100 mg Intravenous Daily  . warfarin  2.5 mg Oral ONCE-1800  . Warfarin - Pharmacist Dosing Inpatient   Does not apply q1800    Family History  Family History  Problem Relation Age of Onset  . COPD Mother   . Hypertension Father     Social History  Social History   Social History  . Marital status: Single    Spouse name: N/A  . Number of children: N/A  . Years of education: N/A   Occupational History  . Not on file.   Social History Main Topics  . Smoking status: Former Smoker  Types: Cigarettes    Start date: 06/29/1987  . Smokeless tobacco: Never Used     Comment: stopped in 1985  . Alcohol use 6.0 oz/week    10 Cans of beer per week     Comment: 03/04/16 - "I have not been drunk in 20 years. I can take it or leave it. "  . Drug use: No  . Sexual activity: No   Other Topics Concern  . Not on file   Social History Narrative   Has never been married, lives with his father. Is currently disabled. Quit smoking in 1985 after two packs per day for 10 years. Still drinks beer.      Review of Systems General:  No chills, fever, night sweats or weight changes.  Cardiovascular:  No chest pain, dyspnea on exertion, edema, orthopnea, palpitations, paroxysmal nocturnal dyspnea. Dermatological: No rash, lesions/masses Respiratory: No cough, dyspnea Urologic: No hematuria, dysuria Abdominal:   No nausea, vomiting, diarrhea, bright red blood per rectum, melena, or hematemesis Neurologic:  No visual changes, wkns, changes in mental status. All other systems reviewed and are otherwise negative except as noted above.  Physical Exam  Blood pressure 100/64, pulse 62, temperature 97.5 F (36.4 C), temperature source Oral, resp. rate 18, height 6\' 1"  (1.854 m), weight 293 lb 10.4 oz (133.2 kg), SpO2 99 %.  General: Pleasant, NAD, obese Psych: Normal affect. Neuro: Alert and oriented X 3. Moves all  extremities spontaneously. HEENT: Normal  Neck: Supple without bruits or JVD. Lungs:  Resp regular and unlabored, CTA. Heart: irregularly, irregular rhythm, regular rate, no s3, s4, or murmurs. Abdomen: Soft, non-tender, non-distended, BS + x 4. Obese  Extremities: No clubbing, + trace edema, chronic venous stasis dermatitis. DP/PT/Radials 2+ and equal bilaterally.  Labs  Troponin [facility] Test)  Recent Labs  10/29/16 2340  TROPIPOC 0.02    Recent Labs  10/30/16 0409  TROPONINI <0.03   Lab Results  Component Value Date   WBC 5.0 10/30/2016   HGB 11.4 (L) 10/30/2016   HCT 35.5 (L) 10/30/2016   MCV 107.3 (H) 10/30/2016   PLT 186 10/30/2016    Recent Labs Lab 10/29/16 2331  NA 144  K 4.3  CL 111  CO2 27  BUN 25*  CREATININE 1.08  CALCIUM 8.7*  PROT 7.0  BILITOT 1.5*  ALKPHOS 138*  ALT 12*  AST 25  GLUCOSE 96   Lab Results  Component Value Date   CHOL 79 02/07/2015   HDL 31 (L) 02/07/2015   LDLCALC 37 02/07/2015   TRIG 54 02/07/2015   No results found for: DDIMER   Radiology/Studies  Dg Chest 2 View  Result Date: 10/30/2016 CLINICAL DATA:  Patient fell from bed.  Pain. EXAM: CHEST  2 VIEW COMPARISON:  03/11/2016 FINDINGS: Cardiomegaly with aortic atherosclerosis. Pulmonary vascular congestion consistent with CHF. Opacity at the left lung base consistent with left effusion and probable atelectasis. No suspicious osseous abnormality. IMPRESSION: Cardiomegaly with CHF and probable small left effusion with atelectasis. Superimposed pneumonia would be difficult to exclude at the left lung base. Electronically Signed   By: Tollie Eth M.D.   On: 10/30/2016 00:08   Dg Knee 2 Views Right  Result Date: 10/30/2016 CLINICAL DATA:  Right knee pain after fall EXAM: RIGHT KNEE - 1-2 VIEW COMPARISON:  08/27/2016 FINDINGS: No acute fracture. Femorotibial joint space narrowing with minimal spurring off the lateral femoral condyle. Spurring of the tibial spine. No  joint effusion, fracture nor bone destruction. IMPRESSION:  Osteoarthritis of the right knee without acute osseous abnormality. Electronically Signed   By: Tollie Eth M.D.   On: 10/30/2016 00:10    ECG  Atrial fibrillation, 60 bpm   Echocardiogram - pending    ASSESSMENT AND PLAN  Active Problems:   Acute respiratory failure with hypoxia (HCC)   Chronic atrial fibrillation (HCC)   Physical deconditioning   Fall   CHF exacerbation (HCC)   1. CHF Exacerbation: prior h/o NICMI with prior EF of 35-40% in 2003. Last echo 02/2016 showed normal LVEF at 55-60% with grade 3DD. BNP on admit was elevated at 594.5. CXR with cardiomegaly with CHF and probable small left pleural effusion. Renal function, K and BP all stable. SCr 1.08. Repeat echo pending.  He denies any significant dyspnea, exertional symptoms, orthopnea or PND. No significant weight change per pt. Weight  is the same as prior office weight. He has trace LE edema on exam. Can give another dose of IV lasix this am and can switch to PO tomorrow. Monitor. Strict I/Os, daily weights and low sodium diet. F/u BMP to monitor renal function and electrolytes.   2. Chronic Atrial Fibrillation: rate is controlled. On chronic coumadin for a/c with therapeutic INR at 2.33. His CHA2DS2 VASc score is at least 3 for HTN, CHF and age 55-74.   3. HTN: controlled on current regimen.   4. Fall: 2/2 bilateral weakness per pt report. No reported LOC. X-rays of legs negative for fractures. Further management per IM.    Signed, Robbie Lis, PA-C 10/30/2016, 7:37 AM As above, patient seen and examined. Briefly he is a 71 year old male with past medical history of presumed nonischemic cardiomyopathy improved on most recent echocardiogram, permanent atrial fibrillation, morbid obesity, hypertension and now status post fall for evaluation of acute on chronic diastolic congestive heart failure. Patient lost his balance and fell injuring his knee. He came  for evaluation and was noted to be in congestive heart failure and was admitted. Cardiology asked to evaluate. He has some dyspnea on exertion and notes increased pedal edema. There is no chest pain, palpitations or syncope. Electrocardiogram shows atrial fibrillation, right bundle branch block, prior anterior infarct unchanged from previous. BNP 594. BUN and creatinine 25 and 1.04.  Acute on chronic diastolic congestive heart failure-patient is markedly volume overloaded on examination. Agree with repeat echocardiogram. We will begin diuresis with Lasix 80 mg IV twice a day. Follow renal function closely. We will add additional medications pending results of echocardiogram.  Permanent atrial fibrillation-rate is controlled on no medications. Continue Coumadin. If he has more frequent falls in the future would be to consider risk-benefit ratio for anticoagulation long-term.  Macrocytic anemia-history of alcohol abuse. Will check B12 and folate. Further evaluation per primary care.  Olga Millers, MD

## 2016-10-30 NOTE — Evaluation (Signed)
Physical Therapy Evaluation Patient Details Name: [name] MRN: [id number] DOB: 04-13-1946 Today's Date: 10/30/2016   History of Present Illness  72 yo male admitted with CHF exacerbation. Hx of HTN, A fib, morbid obesity, CHF, alcoholism, cardiomyopathy, gout  Clinical Impression  On eval, pt required Mod assist +2 for mobility. He was able to walk ~15'x2 with a RW. Pt has most difficulty with bed mobility and standing (especially from a low surface). He presents with general weakness, decreased activity tolerance, and impaired gait and balance. Discussed d/c plan-pt states he plans to return home. Spoke briefly about ST rehab at SNF prior to return home. Pt declined SNF during PT eval. At this time, PT recommendation is for SNF. Will continue to follow and progress activity as tolerated.     Follow Up Recommendations SNF    Equipment Recommendations  None recommended by PT    Recommendations for Other Services OT consult     Precautions / Restrictions Precautions Precautions: Fall Restrictions Weight Bearing Restrictions: No      Mobility  Bed Mobility Overal bed mobility: Needs Assistance Bed Mobility: Supine to Sit;Sit to Supine     Supine to sit: Mod assist;HOB elevated Sit to supine: Mod assist;HOB elevated   General bed mobility comments: Assist for bil LEs and to scoot to EOB. Increased time. Pt used bedrail.   Transfers Overall transfer level: Needs assistance Equipment used: Rolling walker (2 wheeled) Transfers: Sit to/from Stand Sit to Stand: Mod assist;+2 physical assistance;+2 safety/equipment;From elevated surface         General transfer comment: Assist to rise from elevated and low surfaces (+2 assist from recliner, toilet). VCs safety, technique, hand/LE placement. Increased time.   Ambulation/Gait Ambulation/Gait assistance: Min assist;+2 safety/equipment Ambulation Distance (Feet): 15 Feet (x2) Assistive device: Rolling walker (2  wheeled) Gait Pattern/deviations: Step-to pattern;Decreased stride length;Decreased step length - right;Decreased step length - left;Trunk flexed     General Gait Details: Max cues for safe use of walker. Pt tends to keep walker too far ahead. Fatigues easily with activity. Fall risk.   Stairs            Wheelchair Mobility    Modified Rankin (Stroke Patients Only)       Balance Overall balance assessment: Needs assistance;History of Falls         Standing balance support: During functional activity Standing balance-Leahy Scale: Poor                               Pertinent Vitals/Pain Pain Assessment: No/denies pain    Home Living Family/patient expects to be discharged to:: Unsure Living Arrangements: Parent (68 yo old father)   Type of Home: House Home Access: Stairs to enter   Secretary/administrator of Steps: 1 Home Layout: One level Home Equipment: Grab bars - tub/shower;Walker - 2 wheels;Cane - single point;Bedside commode;Shower seat      Prior Function Level of Independence: Independent with assistive device(s)   Gait / Transfers Assistance Needed: pt reports he uses a walker for ambulation  ADL's / Homemaking Assistance Needed: Pt reports he performs ADLS unassisted.   Comments: multiple falls at home per chart.      Hand Dominance        Extremity/Trunk Assessment   Upper Extremity Assessment Upper Extremity Assessment: Generalized weakness    Lower Extremity Assessment Lower Extremity Assessment: Generalized weakness    Cervical / Trunk Assessment Cervical / Trunk Assessment:  Normal  Communication   Communication: No difficulties  Cognition Arousal/Alertness: Awake/alert Behavior During Therapy: WFL for tasks assessed/performed Overall Cognitive Status: Within Functional Limits for tasks assessed                      General Comments      Exercises     Assessment/Plan    PT Assessment Patient needs  continued PT services  PT Problem List Decreased strength;Decreased mobility;Decreased activity tolerance;Decreased balance;Decreased knowledge of use of DME          PT Treatment Interventions DME instruction;Gait training;Therapeutic activities;Therapeutic exercise;Patient/family education;Functional mobility training;Balance training    PT Goals (Current goals can be found in the Care Plan section)  Acute Rehab PT Goals Patient Stated Goal: to go home PT Goal Formulation: With patient Time For Goal Achievement: 11/13/16 Potential to Achieve Goals: Good    Frequency Min 3X/week   Barriers to discharge        Co-evaluation               End of Session   Activity Tolerance: Patient limited by fatigue Patient left: in bed;with call bell/phone within reach;with bed alarm set           Time: 1610-9604 PT Time Calculation (min) (ACUTE ONLY): 30 min   Charges:   PT Evaluation $PT Eval Low Complexity: 1 Procedure PT Treatments $Gait Training: 8-22 mins   PT G Codes:        Rebeca Alert, MPT Pager: [phone]

## 2016-10-30 NOTE — Progress Notes (Signed)
  Echocardiogram 2D Echocardiogram with definity has been performed.  Leta Jungling M 10/30/2016, 8:51 AM

## 2016-10-30 NOTE — Clinical Social Work Note (Signed)
Clinical Social Work Assessment  Patient Details  Name: [name] MRN: [id number] Date of Birth: [birth year]  Date of referral:  10/30/16               Reason for consult:  Facility Placement                Permission sought to share information with:  Oceanographer granted to share information::  Yes, Verbal Permission Granted  Name::        Agency::     Relationship::     Contact Information:     Housing/Transportation Living arrangements for the past 2 months:  Single Family Home Source of Information:  Patient Patient Interpreter Needed:  None Criminal Activity/Legal Involvement Pertinent to Current Situation/Hospitalization:  No - Comment as needed Significant Relationships:  Siblings, Parents Lives with:  Parents Do you feel safe going back to the place where you live?  No Need for family participation in patient care:  Yes (Comment)  Care giving concerns:  CSW received consult that PT recommended SNF at discharge.    Social Worker assessment / plan:  CSW confirmed with patient that he is agreeable with plan for SNF, though he would prefer to return home - he understands the need to go to rehab.   Employment status:    Insurance informationAdvertising account executive PT Recommendations:  Skilled Nursing Facility Information / Referral to community resources:  Skilled Nursing Facility  Patient/Family's Response to care:  Patient is requesting Joetta Manners, CSW sent information out to [facility] and will follow-up with bed offers.   Patient/Family's Understanding of and Emotional Response to Diagnosis, Current Treatment, and Prognosis:    Emotional Assessment Appearance:  Appears stated age Attitude/Demeanor/Rapport:    Affect (typically observed):    Orientation:  Oriented to Self, Oriented to Place, Oriented to Situation Alcohol / Substance use:    Psych involvement (Current and /or in the community):     Discharge Needs   Concerns to be addressed:    Readmission within the last 30 days:    Current discharge risk:    Barriers to Discharge:      Arlyss Repress, LCSW 10/30/2016, 3:27 PM

## 2016-10-30 NOTE — ED Notes (Signed)
Will give primary nurse bedside report.

## 2016-10-30 NOTE — Progress Notes (Signed)
Per tele, pt had 5-6 beats of wide QRS complex. Pt asymptomatic, VSS. MD Marland Mcalpine paged to be made aware. Will continue to monitor.

## 2016-10-30 NOTE — H&P (Signed)
History and Physical    [name] ZOX:[id number] DOB: [birth year] DOA: 10/29/2016   Referring MD/NP/PA: Dr. Erma Heritage PCP: [facility]  Patient coming from: Home  Chief Complaint: Fall  HPI: [name] is a 71 y.o. male with medical history significant of HTN, NICCM, Afib on Coumadin, morbid obesity; who presents after a fall.  Patient reported his legs being weak as the reason for him falling while trying to get out of bed. He notes falling on his buttocks with no trauma to his head or loss of consciousness. Thereafter, he reported complaints of right leg pain. Patient states that he is normally ambulatory with the help of a assistive device. Last fall was packed in December 2017. He previously reports having had physical therapy in the home, but states that they have done all that they can do for him at this time and stopped coming. Patient states that he been taking 80 mg of Lasix daily and denies any weight gain or cough. He reports that his weight should be around 280 pounds. Records show that the patient should be on 60 mg of Lasix daily. Associated symptoms that he has been  experiencing include increased sputum production, leg swelling, and generalized weakness over the last few weeks. Patient states that he previously was on medications for heart failure, but notes at some point in time that these were stopped but does not recall why. Patient is also enrolled in patient outreach and has frequent check enzymes. Per review of records patient's brother called and noted that the patient does not have adequate care at home and that a 82 year old father cannot keep taking care of him. Upon EMS arrival to the home he was noted to smell of urine and the home was noted to be in significant disarray.  Patient notes that he only drinks alcohol but has not drank in the last 1 week.  ED Course: Upon admission to the emergency department patient was seen to be afebrile  with respirations of 23, blood pressures maintained, and O2 saturations as low as 88% on room air. Lab work revealed hemoglobin 11, BNP 594.5, and troponin 0.02. Chest x-ray showed cardiomegaly with CHF and probable small left pleural effusion. X-ray imaging of the right leg show no acute fractures. Patient was given 20 mg of Lasix IV in the ED. [facility] called to admit for further sounds CHF exacerbation.   Review of Systems: As per HPI otherwise 10 point review of systems negative.   Past Medical History:  Diagnosis Date  . Alcoholism (HCC)   . Anemia   . Atrial fibrillation or flutter   . Cardiomyopathy    nonischemic  . CHF (congestive heart failure) (HCC)    ejection fractio of 45% per echo in 2011 (previos EF 35-40% in 2003), echo 04/2013  -LVEF 50-55%.  . Complication of anesthesia    April 11, 2013  . GERD (gastroesophageal reflux disease)    takes tums prn  . Hypertension   . Hypoxemia   . Obesity   . Ventricular tachycardia (HCC)    nonsustained, asymptomatic    Past Surgical History:  Procedure Laterality Date  . I&D EXTREMITY Right 06/28/2013   Procedure: IRRIGATION AND DEBRIDEMENT OF RIGHT LEG WITH SURGICAL PREP/PLACEMENT OF ACELL ;  Surgeon: Wayland Denis, DO;  Location: [facility] OR;  Service: Plastics;  Laterality: Right;  . KNEE BURSECTOMY Right 04/20/2013   Procedure: Right knee prepatella bursa decompression;  Surgeon: Cammy Copa, MD;  Location:  [facility] OR;  Service: Orthopedics;  Laterality: Right;  . TONSILLECTOMY       reports that he has quit smoking. His smoking use included Cigarettes. He started smoking about 29 years ago. He has never used smokeless tobacco. He reports that he drinks about 6.0 oz of alcohol per week . He reports that he does not use drugs.  No Known Allergies  Family History  Problem Relation Age of Onset  . COPD Mother   . Hypertension Father     Prior to Admission medications   Medication Sig Start Date End Date Taking? Authorizing Provider    allopurinol (ZYLOPRIM) 100 MG tablet Take 100 mg by mouth daily.  01/14/16  Yes Historical Provider, MD  feeding supplement, ENSURE ENLIVE, (ENSURE ENLIVE) LIQD Take 237 mLs by mouth 2 (two) times daily between meals. 10/25/15  Yes Elease Etienne, MD  furosemide (LASIX) 40 MG tablet Take 1 tablet (40 mg total) by mouth daily. 03/16/16  Yes Calvert Cantor, MD  Multiple Vitamin (MULTIVITAMIN WITH MINERALS) TABS tablet Take 1 tablet by mouth daily.   Yes Historical Provider, MD  potassium chloride SA (K-DUR,KLOR-CON) 20 MEQ tablet Take 1 tablet (20 mEq total) by mouth daily. 10/25/15  Yes Elease Etienne, MD  pravastatin (PRAVACHOL) 10 MG tablet Take 10 mg by mouth daily.   Yes Historical Provider, MD  warfarin (COUMADIN) 5 MG tablet Take 0.5 tablets (2.5 mg total) by mouth daily at 6 PM. Patient taking differently: Take 2.5-5 mg by mouth as directed. Take 1 tablet (5 mg) on Sun, Tues, Thurs, Sat and Take 0.5 tablet (2.5 mg) on MWF 10/26/15  Yes Elease Etienne, MD  acetaminophen (TYLENOL) 325 MG tablet Take 2 tablets (650 mg total) by mouth every 6 (six) hours as needed for mild pain, moderate pain, fever or headache (or Fever >/= 101). Patient not taking: Reported on 10/29/2016 10/25/15   Elease Etienne, MD  Amino Acids-Protein Hydrolys (FEEDING SUPPLEMENT, PRO-STAT SUGAR FREE 64,) LIQD Take 30 mLs by mouth 2 (two) times daily. Patient not taking: Reported on 08/27/2016 10/25/15   Elease Etienne, MD  furosemide (LASIX) 20 MG tablet Take 1 tablet (20 mg total) by mouth daily. At 4 PM Patient not taking: Reported on 08/27/2016 03/16/16   Calvert Cantor, MD  polyethylene glycol (MIRALAX / GLYCOLAX) packet Take 17 g by mouth daily. Patient not taking: Reported on 08/27/2016 10/25/15   Elease Etienne, MD  QUEtiapine (SEROQUEL) 25 MG tablet Take 1 tablet (25 mg total) by mouth 2 (two) times daily. Patient not taking: Reported on 10/29/2016 03/16/16   Calvert Cantor, MD  senna-docusate (SENOKOT-S) 8.6-50 MG tablet  Take 2 tablets by mouth at bedtime as needed for mild constipation or moderate constipation. Patient not taking: Reported on 10/29/2016 10/25/15   Elease Etienne, MD    Physical Exam:   Constitutional: Morbidly obese male who has very poor hygiene smelling of urine and feces, but able to answer questions and follow commands. Vitals:   10/29/16 2237 10/29/16 2238 10/29/16 2243 10/30/16 0100  BP: 114/69   114/69  Pulse: 73   61  Resp: 18   23  Temp: 97.4 F (36.3 C)   97.6 F (36.4 C)  TempSrc: Oral   Oral  SpO2: (!) 88% 96%  99%  Weight:   136.1 kg (300 lb)   Height:   6\' 2"  (1.88 m)    Eyes: PERRL, lids and conjunctivae normal ENMT: Mucous membranes are  Dry.  Poor dentition.  Neck: normal, supple, no masses, no thyromegaly. Cannot assess adequately for JVD due to neck circumference. Respiratory: Decreased overall aeration with crackles appreciated. No wheezing and patient has a normal respiratory rate. Patient able to talk in near complete sentences. Cardiovascular: Irregular regular and bradycardic, no murmurs / rubs / gallops.+2 pitting lower extremity edema. 2+ pedal pulses. No carotid bruits.  Abdomen: Protuberant abdomen, no tenderness, no masses palpated. No hepatosplenomegaly. Bowel sounds positive.  Musculoskeletal: no clubbing / cyanosis. No joint deformity upper and lower extremities. ROM normal and only fully able to be assessed only in upper extremities at this time, no contractures. Normal muscle tone.  Skin: Venous stasis of the bilateral lower extremities noted  Neurologic: CN 2-12 grossly intact. Sensation intact, DTR normal. Strength 4+/5 in all 4. Patient has intermittent slurred speech. No facial droop noted. Psychiatric: Normal judgment and insight. Alert and oriented x 3. Normal mood.     Labs on Admission: I have personally reviewed following labs and imaging studies  CBC:  Recent Labs Lab 10/29/16 2331  WBC 5.0  NEUTROABS 3.9  HGB 11.0*  HCT 34.4*    MCV 106.8*  PLT 179   Basic Metabolic Panel:  Recent Labs Lab 10/29/16 2331  NA 144  K 4.3  CL 111  CO2 27  GLUCOSE 96  BUN 25*  CREATININE 1.08  CALCIUM 8.7*   GFR: Estimated Creatinine Clearance: 94.8 mL/min (by C-G formula based on SCr of 1.08 mg/dL). Liver Function Tests:  Recent Labs Lab 10/29/16 2331  AST 25  ALT 12*  ALKPHOS 138*  BILITOT 1.5*  PROT 7.0  ALBUMIN 3.2*   No results for input(s): LIPASE, AMYLASE in the last 168 hours. No results for input(s): AMMONIA in the last 168 hours. Coagulation Profile:  Recent Labs Lab 10/29/16 2331  INR 2.40   Cardiac Enzymes: No results for input(s): CKTOTAL, CKMB, CKMBINDEX, TROPONINI in the last 168 hours. BNP (last 3 results) No results for input(s): PROBNP in the last 8760 hours. HbA1C: No results for input(s): HGBA1C in the last 72 hours. CBG: No results for input(s): GLUCAP in the last 168 hours. Lipid Profile: No results for input(s): CHOL, HDL, LDLCALC, TRIG, CHOLHDL, LDLDIRECT in the last 72 hours. Thyroid Function Tests: No results for input(s): TSH, T4TOTAL, FREET4, T3FREE, THYROIDAB in the last 72 hours. Anemia Panel: No results for input(s): VITAMINB12, FOLATE, FERRITIN, TIBC, IRON, RETICCTPCT in the last 72 hours. Urine analysis:    Component Value Date/Time   COLORURINE AMBER (A) 03/05/2016 2241   APPEARANCEUR CLOUDY (A) 03/05/2016 2241   LABSPEC 1.018 03/05/2016 2241   PHURINE 5.0 03/05/2016 2241   GLUCOSEU NEGATIVE 03/05/2016 2241   HGBUR MODERATE (A) 03/05/2016 2241   BILIRUBINUR NEGATIVE 03/05/2016 2241   KETONESUR NEGATIVE 03/05/2016 2241   PROTEINUR NEGATIVE 03/05/2016 2241   UROBILINOGEN 1.0 02/06/2015 1212   NITRITE NEGATIVE 03/05/2016 2241   LEUKOCYTESUR SMALL (A) 03/05/2016 2241   Sepsis Labs: No results found for this or any previous visit (from the past 240 hour(s)).   Radiological Exams on Admission: Dg Chest 2 View  Result Date: 10/30/2016 CLINICAL DATA:  Patient  fell from bed.  Pain. EXAM: CHEST  2 VIEW COMPARISON:  03/11/2016 FINDINGS: Cardiomegaly with aortic atherosclerosis. Pulmonary vascular congestion consistent with CHF. Opacity at the left lung base consistent with left effusion and probable atelectasis. No suspicious osseous abnormality. IMPRESSION: Cardiomegaly with CHF and probable small left effusion with atelectasis. Superimposed pneumonia would be difficult to exclude at  the left lung base. Electronically Signed   By: Tollie Eth M.D.   On: 10/30/2016 00:08   Dg Knee 2 Views Right  Result Date: 10/30/2016 CLINICAL DATA:  Right knee pain after fall EXAM: RIGHT KNEE - 1-2 VIEW COMPARISON:  08/27/2016 FINDINGS: No acute fracture. Femorotibial joint space narrowing with minimal spurring off the lateral femoral condyle. Spurring of the tibial spine. No joint effusion, fracture nor bone destruction. IMPRESSION: Osteoarthritis of the right knee without acute osseous abnormality. Electronically Signed   By: Tollie Eth M.D.   On: 10/30/2016 00:10    EKG: Independently reviewed. Atrial fibrillation 66 bpm  Assessment/Plan CHF exacerbation with history of NICM: Acute .Patient presents with complaints of shortness of breath and increased need to clear his throat of sputum. Chest x-ray showing significant signs for CHF exacerbation and BNP elevated at 594.5. It appears he previously noted to have an EF of 55-60% back in 02/2016. And daily weights - Admit to a telemetry bed - Heart failure protocol initiated - Strict I&O - Check TSH, and troponin - Lasix 40 mg IV q 12 hrs, will likely need to adjust diuresis as tolerated as patient had initial low blood pressures - Check echocardiogram - Cardiology to see in a.m.  - Will need to optimize medical management including beta blocker, ACE inhibitor,  Acute respiratory failure with hypoxia: Patient seen with O2 saturations as low as 88% on room air. Oxygen saturation improved with 2 L nasal cannula oxygen. -  Continuous pulse oximetry with nasal cannula oxygen and keep O2 saturations greater than 92%   Falls with physical deconditioning - Social work consult - Physical therapy to eval and treat  A. Fib on chronic anticoagulation: Patient rate controlled without medication and has a therapeutic INR of 2.4. - Continue Coumadin per pharmacy  Anemia:  Initial hemoglobin 11 with elevated MCV and MCH.  - Check vitamin B12 and folate in a.m.  Hyperlipidemia  - Continue pravastatin    Alcohol abuse history - Check alcohol level as patient has some slurred speech - Neuro checks - CWIA protocol  History of gout - Continue allopurinol  DVT prophylaxis:  On Coumadin Code Status: DNR Family Communication:  No family present at bedside  Disposition Plan: Discharge once medically stable   Consults called: Cards Admission status: Inpatient  Clydie Braun MD [facility] Pager [phone]  If 7PM-7AM, please contact night-coverage [email or web address] Password [email or web address]  10/30/2016, 1:20 AM

## 2016-10-30 NOTE — Progress Notes (Signed)
ANTICOAGULATION CONSULT NOTE - Initial Consult  Pharmacy Consult for warfarin Indication: atrial fibrillation  No Known Allergies  Patient Measurements: Height: 6\' 1"  (185.4 cm) Weight: 293 lb 3.4 oz (133 kg) IBW/kg (Calculated) : 79.9 Heparin Dosing Weight:   Vital Signs: Temp: 97.5 F (36.4 C) (01/19 0233) Temp Source: Oral (01/19 0233) BP: 111/67 (01/19 0233) Pulse Rate: 61 (01/19 0233)  Labs:  Recent Labs  10/29/16 2331 10/30/16 0409  HGB 11.0* 11.4*  HCT 34.4* 35.5*  PLT 179 186  LABPROT 26.6* 26.0*  INR 2.40 2.33  CREATININE 1.08  --   TROPONINI  --  <0.03    Estimated Creatinine Clearance: 92.3 mL/min (by C-G formula based on SCr of 1.08 mg/dL).   Medical History: Past Medical History:  Diagnosis Date  . Alcoholism (HCC)   . Anemia   . Atrial fibrillation or flutter   . Cardiomyopathy    nonischemic  . CHF (congestive heart failure) (HCC)    ejection fractio of 45% per echo in 2011 (previos EF 35-40% in 2003), echo 04/2013  -LVEF 50-55%.  . Complication of anesthesia    April 11, 2013  . GERD (gastroesophageal reflux disease)    takes tums prn  . Hypertension   . Hypoxemia   . Obesity   . Ventricular tachycardia (HCC)    nonsustained, asymptomatic    Medications:  Prescriptions Prior to Admission  Medication Sig Dispense Refill Last Dose  . allopurinol (ZYLOPRIM) 100 MG tablet Take 100 mg by mouth daily.    10/28/2016 at Unknown time  . feeding supplement, ENSURE ENLIVE, (ENSURE ENLIVE) LIQD Take 237 mLs by mouth 2 (two) times daily between meals.   10/29/2016 at Unknown time  . furosemide (LASIX) 40 MG tablet Take 1 tablet (40 mg total) by mouth daily. 30 tablet  10/28/2016 at Unknown time  . Multiple Vitamin (MULTIVITAMIN WITH MINERALS) TABS tablet Take 1 tablet by mouth daily.   10/28/2016 at Unknown time  . potassium chloride SA (K-DUR,KLOR-CON) 20 MEQ tablet Take 1 tablet (20 mEq total) by mouth daily.   10/28/2016 at Unknown time  . pravastatin  (PRAVACHOL) 10 MG tablet Take 10 mg by mouth daily.   10/28/2016 at Unknown time  . warfarin (COUMADIN) 5 MG tablet Take 0.5 tablets (2.5 mg total) by mouth daily at 6 PM. (Patient taking differently: Take 2.5-5 mg by mouth as directed. Take 1 tablet (5 mg) on Sun, Tues, Thurs, Sat and Take 0.5 tablet (2.5 mg) on MWF)   10/28/2016 at 0900  . acetaminophen (TYLENOL) 325 MG tablet Take 2 tablets (650 mg total) by mouth every 6 (six) hours as needed for mild pain, moderate pain, fever or headache (or Fever >/= 101). (Patient not taking: Reported on 10/29/2016)   Not Taking at Unknown time  . Amino Acids-Protein Hydrolys (FEEDING SUPPLEMENT, PRO-STAT SUGAR FREE 64,) LIQD Take 30 mLs by mouth 2 (two) times daily. (Patient not taking: Reported on 08/27/2016)   Not Taking at Unknown time  . furosemide (LASIX) 20 MG tablet Take 1 tablet (20 mg total) by mouth daily. At 4 PM (Patient not taking: Reported on 08/27/2016) 30 tablet 0 Not Taking at Unknown time  . polyethylene glycol (MIRALAX / GLYCOLAX) packet Take 17 g by mouth daily. (Patient not taking: Reported on 08/27/2016)   Not Taking at Unknown time  . QUEtiapine (SEROQUEL) 25 MG tablet Take 1 tablet (25 mg total) by mouth 2 (two) times daily. (Patient not taking: Reported on 10/29/2016) 30 tablet 0  Not Taking at Unknown time  . senna-docusate (SENOKOT-S) 8.6-50 MG tablet Take 2 tablets by mouth at bedtime as needed for mild constipation or moderate constipation. (Patient not taking: Reported on 10/29/2016)   Not Taking at Unknown time   Scheduled:  . allopurinol  100 mg Oral Daily  . chlorhexidine  15 mL Mouth Rinse BID  . feeding supplement (ENSURE ENLIVE)  237 mL Oral BID BM  . folic acid  1 mg Oral Daily  . furosemide  40 mg Intravenous Q12H  . guaiFENesin  600 mg Oral BID  . multivitamin with minerals  1 tablet Oral Daily  . potassium chloride SA  20 mEq Oral Daily  . pravastatin  10 mg Oral Daily  . sodium chloride flush  3 mL Intravenous Q12H  .  thiamine  100 mg Oral Daily   Or  . thiamine  100 mg Intravenous Daily  . Warfarin - Pharmacist Dosing Inpatient   Does not apply q1800    Assessment: Patient with Afib and chronic warfarin.  INR on admit at goal.  Last dose warfarin noted 1/17 at 0900.  Goal of Therapy:  INR 2-3    Plan:  Warfarin 5mg  po x1 now (given) Daily INR Warfarin 2.5mg  po x1 at [address] Dwight Crowford 10/30/2016,5:43 AM

## 2016-10-30 NOTE — NC FL2 (Signed)
[place] MEDICAID FL2 LEVEL OF CARE SCREENING TOOL     IDENTIFICATION  Patient Name: [name] Birthdate: 1946/07/18 Sex: male Admission Date (Current Location): 10/29/2016  [facility] and [place] Number:  Producer, television/film/video and Address:  [facility],  [address] [place], [place] [address]      Provider Number: [id number]  Attending Physician Name and Address:  Merlene Laughter, DO  Relative Name and Phone Number:       Current Level of Care: Hospital Recommended Level of Care: Skilled Nursing Facility Prior Approval Number:    Date Approved/Denied:   PASRR Number: [id number]  Discharge Plan: SNF    Current Diagnoses: Patient Active Problem List   Diagnosis Date Noted  . CHF exacerbation (HCC) 10/30/2016  . CHF (congestive heart failure) (HCC) 10/30/2016  . Bradycardia 03/16/2016  . Alcohol abuse 03/16/2016  . Acute encephalopathy 03/16/2016  . Alcohol withdrawal delirium (HCC) 03/16/2016  . Cor, pulmonale, acute (HCC)   . CAP (community acquired pneumonia) 03/06/2016  . Weakness of both lower extremities 03/04/2016  . Morbid obesity - BMI 45 10/23/2015  . Chronic anticoagulation 10/23/2015  . 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 Diastolic dysfunction with acute on chronic heart failure (HCC) 10/23/2015  . 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 Recurrent falls 10/20/2015  . Physical deconditioning 10/20/2015  . Hyperkalemia 10/20/2015  . Fall   . Streptococcus viridans infection 02/13/2015  . Sepsis (HCC) 02/07/2015  . Anemia of chronic disease 02/07/2015  . Alcoholic hepatitis 02/07/2015  . Thrombocytopenia (HCC) 02/07/2015  . Elevated troponin 02/06/2015  . Hematoma 04/24/2013  . Chronic atrial fibrillation (HCC) 04/24/2013  . Acute respiratory failure with hypoxia (HCC) 04/23/2013  . Cellulitis 04/23/2013  . Essential hypertension 12/06/2009    Orientation RESPIRATION BLADDER Height & Weight     Self, Situation, Place  Normal Continent Weight: 293 lb 10.4 oz (133.2 kg) Height:  6\' 1"  (185.4 cm)  BEHAVIORAL SYMPTOMS/MOOD NEUROLOGICAL BOWEL NUTRITION STATUS      Continent Diet (Heart)  AMBULATORY STATUS COMMUNICATION OF NEEDS Skin   Extensive Assist Verbally Normal                       Personal Care Assistance Level of Assistance  Bathing, Dressing Bathing Assistance: Maximum assistance   Dressing Assistance: Maximum assistance     Functional Limitations Info             SPECIAL CARE FACTORS FREQUENCY  PT (By licensed PT), OT (By licensed OT)     PT Frequency: 5 OT Frequency: 5            Contractures      Additional Factors Info  Code Status, Allergies Code Status Info: DNR Allergies Info: NKDA           Current Medications (10/30/2016):  This is the current hospital active medication list Current Facility-Administered Medications  Medication Dose Route Frequency Provider Last Rate Last Dose  . 0.9 %  sodium chloride infusion  250 mL Intravenous PRN Clydie Braun, MD      . acetaminophen (TYLENOL) tablet 650 mg  650 mg Oral Q4H PRN Clydie Braun, MD      . allopurinol (ZYLOPRIM) tablet 100 mg  100 mg Oral Daily Clydie Braun, MD   100 mg at 10/30/16 0901  . chlorhexidine (PERIDEX) 0.12 % solution 15 mL  15 mL Mouth Rinse BID Clydie Braun, MD   15  mL at 10/30/16 0900  . feeding supplement (ENSURE ENLIVE) (ENSURE ENLIVE) liquid 237 mL  237 mL Oral BID BM Clydie Braun, MD   237 mL at 10/30/16 0910  . folic acid (FOLVITE) tablet 1 mg  1 mg Oral Daily Rondell A Katrinka Blazing, MD   1 mg at 10/30/16 0901  . furosemide (LASIX) injection 80 mg  80 mg Intravenous BID Omair Latif Sheikh, DO      . guaiFENesin (MUCINEX) 12 hr tablet 600 mg  600 mg Oral BID Clydie Braun, MD   600 mg at 10/30/16 0901  . ipratropium-albuterol (DUONEB) 0.5-2.5 (3) MG/3ML nebulizer solution 3 mL  3 mL Nebulization Q2H PRN Rondell A Katrinka Blazing, MD      . LORazepam (ATIVAN) tablet 1 mg  1 mg Oral Q6H PRN  Clydie Braun, MD       Or  . LORazepam (ATIVAN) injection 1 mg  1 mg Intravenous Q6H PRN Clydie Braun, MD      . multivitamin with minerals tablet 1 tablet  1 tablet Oral Daily Clydie Braun, MD   1 tablet at 10/30/16 0902  . ondansetron (ZOFRAN) injection 4 mg  4 mg Intravenous Q6H PRN Rondell A Katrinka Blazing, MD      . potassium chloride SA (K-DUR,KLOR-CON) CR tablet 20 mEq  20 mEq Oral Daily Clydie Braun, MD   20 mEq at 10/30/16 0901  . pravastatin (PRAVACHOL) tablet 10 mg  10 mg Oral Daily Clydie Braun, MD   10 mg at 10/30/16 0902  . sodium chloride flush (NS) 0.9 % injection 3 mL  3 mL Intravenous Q12H Clydie Braun, MD   3 mL at 10/30/16 0906  . sodium chloride flush (NS) 0.9 % injection 3 mL  3 mL Intravenous PRN Clydie Braun, MD      . thiamine (VITAMIN B-1) tablet 100 mg  100 mg Oral Daily Rondell A Katrinka Blazing, MD   100 mg at 10/30/16 0901   Or  . thiamine (B-1) injection 100 mg  100 mg Intravenous Daily Rondell A Katrinka Blazing, MD      . warfarin (COUMADIN) tablet 2.5 mg  2.5 mg Oral ONCE-1800 Clydie Braun, MD      . Warfarin - Pharmacist Dosing Inpatient   Does not apply [id number] Clydie Braun, MD         Discharge Medications: Please see discharge summary for a list of discharge medications.  Relevant Imaging Results:  Relevant Lab Results:   Additional Information Social Security #: [id number]  Arlyss Repress, LCSW

## 2016-10-30 NOTE — Care Management Note (Signed)
Case Management Note  Patient Details  Name: SWEN KERNS MRN: [id number] Date of Birth: Dec 29, 1945  Subjective/Objective:  71 y/o m admitted w/CHF. From home. PT recc SNf. Patient in agreement prefers [facility]-CSW already following.                  Action/Plan:d/c plan SNF.   Expected Discharge Date:                  Expected Discharge Plan:  Skilled Nursing Facility  In-House Referral:  Clinical Social Work  Discharge planning Services  CM Consult  Post Acute Care Choice:    Choice offered to:     DME Arranged:    DME Agency:     HH Arranged:    HH Agency:     Status of Service:  In process, will continue to follow  If discussed at Long Length of Stay Meetings, dates discussed:    Additional Comments:  Lanier Clam, RN 10/30/2016, 1:54 PM

## 2016-10-30 NOTE — Progress Notes (Signed)
MD paged to be notified of patient having 12 beat run of VT. Pt asymptomatic, VSS. Will continue to monitor.

## 2016-10-30 NOTE — Progress Notes (Signed)
Patient was admitted over night after midnight and H and P has been reviewed and I am in agreement with Current Assessment and Plan. Patient is as 71 year old disheveled looking Caucasian male with a PMH of HTN, Noni-ischemic Cardiomyopathy, Atrial Fibrillation, Diastolic Heart Failure with EF of 55-60% on ECHO in 2017, and other comorbidities who presented to [facility] after a fall from his Bed and bilateral leg weakness. Was worked up and admitted for CHF Exacerbation and Acute Respiratory Failure with Hypoxia. Echocardiogram was repeated and pending read. Cardiology Evaluated the patient and increased Diuresis from IV 40 mg of Lasix BID to IV 80 mg BID blood pressure permitting. Patient to remain on Coumadin currently for his Permanent Atrial Fibrillation. PT was consulted and recommended SNF. Will continue to Monitor Volume Status, Strict I's and O's, Daily Weights and watch clinical response of lasix. Repeat Blood work in AM and monitor patient closely.

## 2016-10-30 NOTE — Clinical Social Work Placement (Signed)
   CLINICAL SOCIAL WORK PLACEMENT  NOTE  Date:  10/30/2016  Patient Details  Name: [name] MRN: [id number] Date of Birth: [birth year]  Clinical Social Work is seeking post-discharge placement for this patient at the Skilled  Nursing Facility level of care (*CSW will initial, date and re-position this form in  chart as items are completed):  Yes   Patient/family provided with [facility] Clinical Social Work Department's list of facilities offering this level of care within the geographic area requested by the patient (or if unable, by the patient's family).  Yes   Patient/family informed of their freedom to choose among providers that offer the needed level of care, that participate in Medicare, Medicaid or managed care program needed by the patient, have an available bed and are willing to accept the patient.  Yes   Patient/family informed of [facility]'s ownership interest in [facility] and [facility], as well as of the fact that they are under no obligation to receive care at these facilities.  PASRR submitted to EDS on       PASRR number received on       Existing PASRR number confirmed on 10/30/16     FL2 transmitted to all facilities in geographic area requested by pt/family on 10/30/16     FL2 transmitted to all facilities within larger geographic area on       Patient informed that his/her managed care company has contracts with or will negotiate with certain facilities, including the following:            Patient/family informed of bed offers received.  Patient chooses bed at       Physician recommends and patient chooses bed at      Patient to be transferred to   on  .  Patient to be transferred to facility by       Patient family notified on   of transfer.  Name of family member notified:        PHYSICIAN       Additional Comment:    _______________________________________________ Arlyss Repress, LCSW 10/30/2016, 3:25 PM

## 2016-10-31 DIAGNOSIS — I472 Ventricular tachycardia, unspecified: Secondary | ICD-10-CM

## 2016-10-31 LAB — COMPREHENSIVE METABOLIC PANEL
ALBUMIN: 3 g/dL — AB (ref 3.5–5.0)
ALT: 11 U/L — ABNORMAL LOW (ref 17–63)
ANION GAP: 7 (ref 5–15)
AST: 22 U/L (ref 15–41)
Alkaline Phosphatase: 125 U/L (ref 38–126)
BUN: 25 mg/dL — AB (ref 6–20)
CHLORIDE: 110 mmol/L (ref 101–111)
CO2: 29 mmol/L (ref 22–32)
Calcium: 8.7 mg/dL — ABNORMAL LOW (ref 8.9–10.3)
Creatinine, Ser: 1.02 mg/dL (ref 0.61–1.24)
GFR calc Af Amer: >60 mL/min (ref >60)
GFR calc non Af Amer: >60 mL/min (ref >60)
GLUCOSE: 89 mg/dL (ref 65–99)
POTASSIUM: 4.4 mmol/L (ref 3.5–5.1)
SODIUM: 146 mmol/L — AB (ref 135–145)
Total Bilirubin: 1.5 mg/dL — ABNORMAL HIGH (ref 0.3–1.2)
Total Protein: 6.6 g/dL (ref 6.5–8.1)

## 2016-10-31 LAB — CBC WITH DIFFERENTIAL/PLATELET
BASOS ABS: 0 10*3/uL (ref 0.0–0.1)
BASOS PCT: 0 %
EOS ABS: 0.2 10*3/uL (ref 0.0–0.7)
EOS PCT: 3 %
HCT: 32.3 % — ABNORMAL LOW (ref 39.0–52.0)
Hemoglobin: 10.3 g/dL — ABNORMAL LOW (ref 13.0–17.0)
Lymphocytes Relative: 14 %
Lymphs Abs: 0.7 10*3/uL (ref 0.7–4.0)
MCH: 34.3 pg — ABNORMAL HIGH (ref 26.0–34.0)
MCHC: 31.9 g/dL (ref 30.0–36.0)
MCV: 107.7 fL — ABNORMAL HIGH (ref 78.0–100.0)
MONO ABS: 0.4 10*3/uL (ref 0.1–1.0)
Monocytes Relative: 8 %
NEUTROS ABS: 4 10*3/uL (ref 1.7–7.7)
Neutrophils Relative %: 75 %
PLATELETS: 186 10*3/uL (ref 150–400)
RBC: 3 MIL/uL — ABNORMAL LOW (ref 4.22–5.81)
RDW: 15.8 % — AB (ref 11.5–15.5)
WBC: 5.4 10*3/uL (ref 4.0–10.5)

## 2016-10-31 LAB — PROTIME-INR
INR: 2.99
PROTHROMBIN TIME: 31.7 s — AB (ref 11.4–15.2)

## 2016-10-31 LAB — PHOSPHORUS: Phosphorus: 3.5 mg/dL (ref 2.5–4.6)

## 2016-10-31 LAB — MAGNESIUM: Magnesium: 1.8 mg/dL (ref 1.7–2.4)

## 2016-10-31 MED ORDER — METOPROLOL SUCCINATE ER 25 MG PO TB24
12.5000 mg | ORAL_TABLET | Freq: Every day | ORAL | Status: DC
  Administered 2016-10-31 – 2016-11-04 (×4): 12.5 mg via ORAL
  Filled 2016-10-31 (×4): qty 1

## 2016-10-31 MED ORDER — MUPIROCIN 2 % EX OINT
1.0000 "application " | TOPICAL_OINTMENT | Freq: Two times a day (BID) | CUTANEOUS | Status: DC
  Administered 2016-10-31 – 2016-11-04 (×9): 1 via NASAL
  Filled 2016-10-31: qty 22

## 2016-10-31 MED ORDER — CHLORHEXIDINE GLUCONATE CLOTH 2 % EX PADS
6.0000 | MEDICATED_PAD | Freq: Every day | CUTANEOUS | Status: DC
  Administered 2016-10-31 – 2016-11-02 (×2): 6 via TOPICAL

## 2016-10-31 NOTE — Progress Notes (Signed)
ANTICOAGULATION CONSULT NOTE - Follow Up Consult  Pharmacy Consult for warfarin Indication: hx  atrial fibrillation  No Known Allergies  Patient Measurements: Height: 6\' 1"  (185.4 cm) Weight: 281 lb 8.4 oz (127.7 kg) IBW/kg (Calculated) : 79.9 Heparin Dosing Weight:  Vital Signs: Temp: 97.5 F (36.4 C) (01/20 0626) Temp Source: Oral (01/20 0626) BP: 126/78 (01/20 0928) Pulse Rate: 76 (01/20 0928)  Labs:  Recent Labs  10/29/16 2331 10/30/16 0409 10/30/16 0949 10/30/16 1603 10/31/16 0545  HGB 11.0* 11.4*  --   --  10.3*  HCT 34.4* 35.5*  --   --  32.3*  PLT 179 186  --   --  186  LABPROT 26.6* 26.0*  --   --  31.7*  INR 2.40 2.33  --   --  2.99  CREATININE 1.08 1.04  --   --  1.02  TROPONINI  --  <0.03 <0.03 <0.03  --     Estimated Creatinine Clearance: 95.7 mL/min (by C-G formula based on SCr of 1.02 mg/dL).   Medications:  Home warfarin regimen: 5 mg daily except 2.5 mg on MWF (last [facility] clinic note in Dec said dose adjusted to 5 mg daily except 2.5 mg on MF d/t low INR, but pt never changed to this new dose -- he continued taking 5 mg daily except 2.5 mg MWF).  INR was therapeutic on admission at 2.40.  Assessment: Patient's a 71 y.o M with hx of afib on warfarin PTA, presented to the ED on 10/30/16 s/p fall.  Warfarin resumed on admission.  Today, 10/31/2016: - INR is therapeutic but increased from 2.33 to 2.99 - Hgb down slightly, plt stable - no bleeding documented - no significant drug-drug intxns - heart healthy diet   Goal of Therapy:  INR 2-3 Monitor platelets by anticoagulation protocol: Yes   Plan:  - with sharp increased in INR, will hold dose today - monitor for s/s bleeding  Donnice Nielsen P 10/31/2016,9:59 AM

## 2016-10-31 NOTE — Progress Notes (Signed)
PROGRESS NOTE    [name]  FAO:[id number] DOB: Dec 04, 1945 DOA: 10/29/2016 PCP: [facility]   Brief Narrative:  [name] is a 71 y.o. male with medical history significant of HTN, NICCM, Afib on Coumadin, morbid obesity; who presents after a fall.  Patient reported his legs being weak as the reason for him falling while trying to get out of bed. He notes falling on his buttocks with no trauma to his head or loss of consciousness. Thereafter, he reported complaints of right leg pain. Patient states that he is normally ambulatory with the help of a assistive device. Last fall was packed in December 2017. He previously reports having had physical therapy in the home, but states that they have done all that they can do for him at this time and stopped coming. Patient states that he been taking 80 mg of Lasix daily and denies any weight gain or cough. He reports that his weight should be around 280 pounds. Records show that the patient should be on 60 mg of Lasix daily. Associated symptoms that he has been  experiencing include increased sputum production, leg swelling, and generalized weakness over the last few weeks. Patient states that he previously was on medications for heart failure, but notes at some point in time that these were stopped but does not recall why. Patient is also enrolled in patient outreach and has frequent check enzymes. Per review of records patient's brother called and noted that the patient does not have adequate care at home and that a 58 year old father cannot keep taking care of him. Upon EMS arrival to the home he was noted to smell of urine and the home was noted to be in significant disarray.  Patient notes that he only drinks alcohol but has not drank in the last 1 week. Admitted for Acute Diastolic CHF Exacerbation.  Assessment & Plan:   Principal Problem:   CHF exacerbation (HCC) Active Problems:   Acute respiratory failure with  hypoxia (HCC)   Chronic atrial fibrillation (HCC)   Physical deconditioning   Fall   Alcohol abuse   V-tach [facility])  Acute Decompensation of Chronic Grade 3 Diastolic CHF in the setting of Non-ischemic Cardiomyopathy -Continue with Telemtery and Heart Failure Protocol -ECHO on 5/201 showed EF of 55-60% -Repeat TTE repeated and showed EF of 50-55% and Grade 3 Diastolic Dysfunction -Strict I's and O's, Daily Weights and Fluid Restriction -BNP was 594.5 and CXR howing significant signs for CHF exacerbation -IV Diuresis with 80 mg of IV Lasix -Cardiology Following and appreciate Recc's; Started low dose Toprol XL because of Ventricular Ectopy -Possible addition of low-dose ARB if BP allows.   Acute respiratory failure with Hypoxia -Patient seen with O2 saturations as low as 88% on room air. Oxygen saturation improved with 2 L nasal cannula oxygen. -Continuous pulse oximetry with nasal cannula oxygen and keep O2 saturations greater than 92%  -Wean O2 as Tolerated   Nonsustained VTACH -Had 12 beat Run last night -Cardiology Following and Added Toprol XL  Fall with Generalized Weakness and Deconditioning -Social work consult for SNF placement -PT Evaluated and Treated and recommend SNF  A. Fib on chronic anticoagulation -CHADS2VASc at least 3 -Patient rate controlled without medication and has a therapeutic INR of 2.99 -Continue Coumadin per pharmacy -Cardiology added Toprol XL  Macrocytic Anemia   - Initial hemoglobin 11 with elevated MCV and MCH.  - Hb/Hct went from 11.4/35.5 -> 10.3/32.3 - B12 was 722 and Folate  Pending  Hyperlipidemia  - Continue Pravastatin 10 mg po Daily   Alcohol abuse history - EtOH Levels <5 - Neuro checks - CWIA protocol  History of Gout - Continue Allopurinol 100 mg po Daily  DVT prophylaxis: Anticoagulated with Coumadin Code Status: DO NOT RESUSCITATE Family Communication: No family present at bedside Disposition Plan: SNF if patient is  Agreeable  Consultants:   Cardiology  Procedures: ECHOCardiogram  Antimicrobials: None  Subjective: Seen and examined at bedside and stated his legs felt less swollen and he was feeling better. Condom catheter had fallen off and bed was soaked. No N/V. No CP or lightheadedness or dizziness. Was able to pick up leg today.   Objective: Vitals:   10/31/16 0626 10/31/16 0633 10/31/16 0928 10/31/16 1451  BP: (!) 103/55  126/78 102/61  Pulse: 71  76 88  Resp: 20   20  Temp: 97.5 F (36.4 C)   97.2 F (36.2 C)  TempSrc: Oral   Oral  SpO2: 96%   100%  Weight:  127.7 kg (281 lb 8.4 oz)    Height:        Intake/Output Summary (Last 24 hours) at 10/31/16 1549 Last data filed at 10/31/16 1452  Gross per 24 hour  Intake              480 ml  Output             3150 ml  Net            -2670 ml   Filed Weights   10/30/16 0233 10/30/16 0547 10/31/16 2902  Weight: 133 kg (293 lb 3.4 oz) 133.2 kg (293 lb 10.4 oz) 127.7 kg (281 lb 8.4 oz)   Examination: Physical Exam:  Constitutional: WN/WD obese, NAD and appears calm and comfortable Eyes: Lids and conjunctivae normal, sclerae anicteric  ENMT: External Ears, Nose appear normal. Grossly normal hearing.  Neck: Appears normal, supple, no cervical masses, normal ROM, no appreciable thyromegaly, some JVD Respiratory: Diminished to auscultation bilaterally, no wheezing, rales, rhonchi or crackles. Normal respiratory effort and patient is not tachypenic. No accessory muscle use.  Cardiovascular: Irregularly Irregular Rhythm and Rate, no murmurs / rubs / gallops. S1 and S2 auscultated. 1+ extremity edema.  Abdomen: Soft, non-tender, non-distended. No masses palpated. No appreciable hepatosplenomegaly. Bowel sounds positive x4.  GU: Deferred. Condom Catheter off Musculoskeletal: No clubbing / cyanosis of digits/nails.  Skin: No rashes, lesions, ulcers on limited examination. No induration; Warm and dry.  Neurologic: CN 2-12 grossly intact with  no focal deficits.  Romberg sign cerebellar reflexes not assessed.  Psychiatric: Normal judgment and insight. Alert and oriented x 3. Normal mood and appropriate affect.   Data Reviewed: I have personally reviewed following labs and imaging studies  CBC:  Recent Labs Lab 10/29/16 2331 10/30/16 0409 10/31/16 0545  WBC 5.0 5.0 5.4  NEUTROABS 3.9 3.6 4.0  HGB 11.0* 11.4* 10.3*  HCT 34.4* 35.5* 32.3*  MCV 106.8* 107.3* 107.7*  PLT 179 186 186   Basic Metabolic Panel:  Recent Labs Lab 10/29/16 2331 10/30/16 0409 10/31/16 0545  NA 144 144 146*  K 4.3 4.4 4.4  CL 111 111 110  CO2 27 22 29   GLUCOSE 96 78 89  BUN 25* 25* 25*  CREATININE 1.08 1.04 1.02  CALCIUM 8.7* 8.7* 8.7*  MG  --   --  1.8  PHOS  --   --  3.5   GFR: Estimated Creatinine Clearance: 95.7 mL/min (by C-G formula based on  SCr of 1.02 mg/dL). Liver Function Tests:  Recent Labs Lab 10/29/16 2331 10/30/16 0409 10/31/16 0545  AST 25 27 22   ALT 12* 12* 11*  ALKPHOS 138* 141* 125  BILITOT 1.5* 1.3* 1.5*  PROT 7.0 7.4 6.6  ALBUMIN 3.2* 3.2* 3.0*   No results for input(s): LIPASE, AMYLASE in the last 168 hours. No results for input(s): AMMONIA in the last 168 hours. Coagulation Profile:  Recent Labs Lab 10/29/16 2331 10/30/16 0409 10/31/16 0545  INR 2.40 2.33 2.99   Cardiac Enzymes:  Recent Labs Lab 10/30/16 0409 10/30/16 0949 10/30/16 1603  TROPONINI <0.03 <0.03 <0.03   BNP (last 3 results) No results for input(s): PROBNP in the last 8760 hours. HbA1C: No results for input(s): HGBA1C in the last 72 hours. CBG: No results for input(s): GLUCAP in the last 168 hours. Lipid Profile: No results for input(s): CHOL, HDL, LDLCALC, TRIG, CHOLHDL, LDLDIRECT in the last 72 hours. Thyroid Function Tests:  Recent Labs  10/30/16 0409  TSH 2.275   Anemia Panel:  Recent Labs  10/30/16 0409  VITAMINB12 722   Sepsis Labs:  Recent Labs Lab 10/29/16 2342  LATICACIDVEN 1.24    Recent  Results (from the past 240 hour(s))  MRSA PCR Screening     Status: Abnormal   Collection Time: 10/30/16  3:04 AM  Result Value Ref Range Status   MRSA by PCR POSITIVE (A) NEGATIVE Final    Comment:        The GeneXpert MRSA Assay (FDA approved for NASAL specimens only), is one component of a comprehensive MRSA colonization surveillance program. It is not intended to diagnose MRSA infection nor to guide or monitor treatment for MRSA infections. RESULT CALLED TO, READ BACK BY AND VERIFIED WITH: SEAY,K @ 1030 ON [id number] BY POTEAT,S     Radiology Studies: Dg Chest 2 View  Result Date: 10/30/2016 CLINICAL DATA:  Patient fell from bed.  Pain. EXAM: CHEST  2 VIEW COMPARISON:  03/11/2016 FINDINGS: Cardiomegaly with aortic atherosclerosis. Pulmonary vascular congestion consistent with CHF. Opacity at the left lung base consistent with left effusion and probable atelectasis. No suspicious osseous abnormality. IMPRESSION: Cardiomegaly with CHF and probable small left effusion with atelectasis. Superimposed pneumonia would be difficult to exclude at the left lung base. Electronically Signed   By: Tollie Eth M.D.   On: 10/30/2016 00:08   Dg Knee 2 Views Right  Result Date: 10/30/2016 CLINICAL DATA:  Right knee pain after fall EXAM: RIGHT KNEE - 1-2 VIEW COMPARISON:  08/27/2016 FINDINGS: No acute fracture. Femorotibial joint space narrowing with minimal spurring off the lateral femoral condyle. Spurring of the tibial spine. No joint effusion, fracture nor bone destruction. IMPRESSION: Osteoarthritis of the right knee without acute osseous abnormality. Electronically Signed   By: Tollie Eth M.D.   On: 10/30/2016 00:10   Scheduled Meds: . allopurinol  100 mg Oral Daily  . chlorhexidine  15 mL Mouth Rinse BID  . Chlorhexidine Gluconate Cloth  6 each Topical Q0600  . feeding supplement (ENSURE ENLIVE)  237 mL Oral BID BM  . folic acid  1 mg Oral Daily  . furosemide  80 mg Intravenous BID  .  guaiFENesin  600 mg Oral BID  . metoprolol succinate  12.5 mg Oral Daily  . multivitamin with minerals  1 tablet Oral Daily  . mupirocin ointment  1 application Nasal BID  . potassium chloride SA  20 mEq Oral Daily  . pravastatin  10 mg Oral Daily  . sodium  chloride flush  3 mL Intravenous Q12H  . thiamine  100 mg Oral Daily   Or  . thiamine  100 mg Intravenous Daily  . Warfarin - Pharmacist Dosing Inpatient   Does not apply q1800   Continuous Infusions:   LOS: 1 day   Merlene Laughter, DO [facility] Pager [phone]  If 7PM-7AM, please contact night-coverage [email or web address] Password [facility] 10/31/2016, 3:49 PM

## 2016-10-31 NOTE — Progress Notes (Signed)
Progress Note  Patient Name: [name] Date of Encounter: 10/31/2016  Primary Cardiologist: Dr. [name]  Subjective   Up in bedside chair. Ate a small breakfast. No breathlessness at rest. No chest pain.  Inpatient Medications    Scheduled Meds: . allopurinol  100 mg Oral Daily  . chlorhexidine  15 mL Mouth Rinse BID  . feeding supplement (ENSURE ENLIVE)  237 mL Oral BID BM  . folic acid  1 mg Oral Daily  . furosemide  80 mg Intravenous BID  . guaiFENesin  600 mg Oral BID  . multivitamin with minerals  1 tablet Oral Daily  . potassium chloride SA  20 mEq Oral Daily  . pravastatin  10 mg Oral Daily  . sodium chloride flush  3 mL Intravenous Q12H  . thiamine  100 mg Oral Daily   Or  . thiamine  100 mg Intravenous Daily  . Warfarin - Pharmacist Dosing Inpatient   Does not apply q1800    PRN Meds: sodium chloride, acetaminophen, ipratropium-albuterol, LORazepam **OR** LORazepam, ondansetron (ZOFRAN) IV, sodium chloride flush   Vital Signs    Vitals:   10/30/16 1858 10/30/16 2054 10/31/16 0626 10/31/16 0633  BP: 113/70 120/67 (!) 103/55   Pulse: 71 68 71   Resp: 18 18 20    Temp: 98.6 F (37 C) 98.7 F (37.1 C) 97.5 F (36.4 C)   TempSrc: Oral Oral Oral   SpO2: 98% 98% 96%   Weight:    281 lb 8.4 oz (127.7 kg)  Height:        Intake/Output Summary (Last 24 hours) at 10/31/16 0834 Last data filed at 10/31/16 5364  Gross per 24 hour  Intake              960 ml  Output             3350 ml  Net            -2390 ml   Filed Weights   10/30/16 0233 10/30/16 0547 10/31/16 6803  Weight: 293 lb 3.4 oz (133 kg) 293 lb 10.4 oz (133.2 kg) 281 lb 8.4 oz (127.7 kg)    Telemetry    I personally reviewed telemetry which shows atrial fibrillation with occasional PVCs and bursts of NSVT.  ECG    I personally reviewed the tracing from 10/29/2016 which shows atrial fibrillation with low voltage, IVCD, possible old anterolateral infarct pattern.  Physical Exam    GEN: No acute distress.  Neck: No JVD Cardiac: Irregularly irregular.  Respiratory:  diminished breath sounds at the bases. GI: Soft, nontender, non-distended  MS: 1-2+; No deformity.  Labs    Chemistry Recent Labs Lab 10/29/16 2331 10/30/16 0409 10/31/16 0545  NA 144 144 146*  K 4.3 4.4 4.4  CL 111 111 110  CO2 27 22 29   GLUCOSE 96 78 89  BUN 25* 25* 25*  CREATININE 1.08 1.04 1.02  CALCIUM 8.7* 8.7* 8.7*  PROT 7.0 7.4 6.6  ALBUMIN 3.2* 3.2* 3.0*  AST 25 27 22   ALT 12* 12* 11*  ALKPHOS 138* 141* 125  BILITOT 1.5* 1.3* 1.5*  GFRNONAA >60 >60 >60  GFRAA >60 >60 >60  ANIONGAP 6 11 7      Hematology Recent Labs Lab 10/29/16 2331 10/30/16 0409 10/31/16 0545  WBC 5.0 5.0 5.4  RBC 3.22* 3.31* 3.00*  HGB 11.0* 11.4* 10.3*  HCT 34.4* 35.5* 32.3*  MCV 106.8* 107.3* 107.7*  MCH 34.2* 34.4* 34.3*  MCHC 32.0 32.1  31.9  RDW 15.8* 15.9* 15.8*  PLT 179 186 186    Cardiac Enzymes Recent Labs Lab 10/30/16 0409 10/30/16 0949 10/30/16 1603  TROPONINI <0.03 <0.03 <0.03    Recent Labs Lab 10/29/16 2340  TROPIPOC 0.02     BNP Recent Labs Lab 10/29/16 2336  BNP 594.5*     Radiology    Dg Chest 2 View  Result Date: 10/30/2016 CLINICAL DATA:  Patient fell from bed.  Pain. EXAM: CHEST  2 VIEW COMPARISON:  03/11/2016 FINDINGS: Cardiomegaly with aortic atherosclerosis. Pulmonary vascular congestion consistent with CHF. Opacity at the left lung base consistent with left effusion and probable atelectasis. No suspicious osseous abnormality. IMPRESSION: Cardiomegaly with CHF and probable small left effusion with atelectasis. Superimposed pneumonia would be difficult to exclude at the left lung base. Electronically Signed   By: Tollie Eth M.D.   On: 10/30/2016 00:08   Dg Knee 2 Views Right  Result Date: 10/30/2016 CLINICAL DATA:  Right knee pain after fall EXAM: RIGHT KNEE - 1-2 VIEW COMPARISON:  08/27/2016 FINDINGS: No acute fracture. Femorotibial joint space  narrowing with minimal spurring off the lateral femoral condyle. Spurring of the tibial spine. No joint effusion, fracture nor bone destruction. IMPRESSION: Osteoarthritis of the right knee without acute osseous abnormality. Electronically Signed   By: Tollie Eth M.D.   On: 10/30/2016 00:10    Cardiac Studies   Echocardiogram 10/30/2016: Study Conclusions  - Left ventricle: The cavity size was normal. There was mild   concentric hypertrophy. Systolic function was normal. The   estimated ejection fraction was in the range of 50% to 55%. Wall   motion was normal; there were no regional wall motion   abnormalities. Doppler parameters are consistent with a   reversible restrictive pattern, indicative of decreased left   ventricular diastolic compliance and/or increased left atrial   pressure (grade 3 diastolic dysfunction). - Ventricular septum: The contour showed diastolic flattening. - Aortic valve: Trileaflet; moderately thickened, moderately   calcified leaflets. - Aorta: The aorta was mildly calcified. - Mitral valve: Calcified annulus. There was moderate regurgitation   directed posteriorly. - Left atrium: The atrium was severely dilated. - Right ventricle: The cavity size was moderately dilated. Wall   thickness was normal. Systolic function was severely reduced. - Tricuspid valve: There was moderate regurgitation directed   eccentrically. - Pulmonary arteries: Systolic pressure was moderately increased.   PA peak pressure: 47 mm Hg (S). - Inferior vena cava: The vessel was normal in size. The   respirophasic diameter changes were blunted (< 50%), consistent   with elevated central venous pressure. - Pericardium, extracardiac: A trivial pericardial effusion was   identified. There was a left pleural effusion.  Impressions:  - LVEF is mildly reduced when compared to prior.  Patient Profile     70 y.o. male with history of presumed nonischemic cardiomyopathy, chronic  atrial fibrillation, morbid obesity, hypertension, and status post recent fall with left knee injury. He has evidence of acute on chronic diastolic heart failure with significant volume overload. Presently diuresing on IV Lasix. LVEF 50-55% with grade 3 diastolic dysfunction by follow-up echocardiogram.  Assessment & Plan    1. Acute on chronic diastolic heart failure with volume overload. Follow-up echocardiogram shows LVEF 50-55% with grade 3 diastolic dysfunction. He is diuresing well on IV Lasix at this point.  2. PVCs and brief bursts of NSVT by telemetry.  3. Chronic atrial fibrillation, on Coumadin. INR 2.9.  4. Essential hypertension, blood pressure adequately  controlled at this time.  Continue IV Lasix for now, follow-up urine output and renal function in a.m. Will also start low-dose Toprol-XL particularly in light of ventricular ectopy - watch heart rate and blood pressure. Eventually may consider low-dose ARB if blood pressure allows.  Signed, Nona Dell, MD  10/31/2016, 8:34 AM

## 2016-11-01 LAB — COMPREHENSIVE METABOLIC PANEL
ALBUMIN: 2.8 g/dL — AB (ref 3.5–5.0)
ALT: 10 U/L — ABNORMAL LOW (ref 17–63)
AST: 22 U/L (ref 15–41)
Alkaline Phosphatase: 118 U/L (ref 38–126)
Anion gap: 7 (ref 5–15)
BILIRUBIN TOTAL: 1.3 mg/dL — AB (ref 0.3–1.2)
BUN: 26 mg/dL — AB (ref 6–20)
CHLORIDE: 104 mmol/L (ref 101–111)
CO2: 30 mmol/L (ref 22–32)
Calcium: 8.4 mg/dL — ABNORMAL LOW (ref 8.9–10.3)
Creatinine, Ser: 1.12 mg/dL (ref 0.61–1.24)
GFR calc Af Amer: >60 mL/min (ref >60)
GFR calc non Af Amer: >60 mL/min (ref >60)
GLUCOSE: 89 mg/dL (ref 65–99)
POTASSIUM: 4.1 mmol/L (ref 3.5–5.1)
Sodium: 141 mmol/L (ref 135–145)
TOTAL PROTEIN: 6.2 g/dL — AB (ref 6.5–8.1)

## 2016-11-01 LAB — CBC WITH DIFFERENTIAL/PLATELET
BASOS PCT: 0 %
Basophils Absolute: 0 10*3/uL (ref 0.0–0.1)
Eosinophils Absolute: 0.2 10*3/uL (ref 0.0–0.7)
Eosinophils Relative: 3 %
HEMATOCRIT: 32.1 % — AB (ref 39.0–52.0)
Hemoglobin: 10.1 g/dL — ABNORMAL LOW (ref 13.0–17.0)
Lymphocytes Relative: 18 %
Lymphs Abs: 0.8 10*3/uL (ref 0.7–4.0)
MCH: 33.8 pg (ref 26.0–34.0)
MCHC: 31.5 g/dL (ref 30.0–36.0)
MCV: 107.4 fL — ABNORMAL HIGH (ref 78.0–100.0)
MONO ABS: 0.4 10*3/uL (ref 0.1–1.0)
Monocytes Relative: 9 %
NEUTROS ABS: 3.3 10*3/uL (ref 1.7–7.7)
NEUTROS PCT: 70 %
Platelets: 170 10*3/uL (ref 150–400)
RBC: 2.99 MIL/uL — ABNORMAL LOW (ref 4.22–5.81)
RDW: 15.6 % — AB (ref 11.5–15.5)
WBC: 4.7 10*3/uL (ref 4.0–10.5)

## 2016-11-01 LAB — PROTIME-INR
INR: 2.85
PROTHROMBIN TIME: 30.5 s — AB (ref 11.4–15.2)

## 2016-11-01 LAB — PHOSPHORUS: Phosphorus: 3.3 mg/dL (ref 2.5–4.6)

## 2016-11-01 LAB — MAGNESIUM: Magnesium: 1.7 mg/dL (ref 1.7–2.4)

## 2016-11-01 MED ORDER — WARFARIN SODIUM 2.5 MG PO TABS
2.5000 mg | ORAL_TABLET | Freq: Once | ORAL | Status: AC
  Administered 2016-11-01: 2.5 mg via ORAL
  Filled 2016-11-01: qty 1

## 2016-11-01 NOTE — Progress Notes (Signed)
ANTICOAGULATION CONSULT NOTE - Follow Up Consult  Pharmacy Consult for warfarin Indication: hx  atrial fibrillation  No Known Allergies  Patient Measurements: Height: 6\' 1"  (185.4 cm) Weight: 285 lb 0.9 oz (129.3 kg) IBW/kg (Calculated) : 79.9 Heparin Dosing Weight:  Vital Signs: Temp: 98 F (36.7 C) (01/21 0628) Temp Source: Oral (01/21 0628) BP: 106/60 (01/21 0846) Pulse Rate: 63 (01/21 0628)  Labs:  Recent Labs  10/30/16 0409 10/30/16 0949 10/30/16 1603 10/31/16 0545 11/01/16 0550  HGB 11.4*  --   --  10.3* 10.1*  HCT 35.5*  --   --  32.3* 32.1*  PLT 186  --   --  186 170  LABPROT 26.0*  --   --  31.7* 30.5*  INR 2.33  --   --  2.99 2.85  CREATININE 1.04  --   --  1.02 1.12  TROPONINI <0.03 <0.03 <0.03  --   --     Estimated Creatinine Clearance: 87.8 mL/min (by C-G formula based on SCr of 1.12 mg/dL).   Medications:  Home warfarin regimen: 5 mg daily except 2.5 mg on MWF (last [facility] clinic note in Dec said dose adjusted to 5 mg daily except 2.5 mg on MF d/t low INR, but pt never changed to this new dose -- he continued taking 5 mg daily except 2.5 mg MWF).  INR was therapeutic on admission at 2.40.  Assessment: Patient's a 71 y.o M with hx of afib on warfarin PTA, presented to the ED on 10/30/16 s/p fall.  Warfarin resumed on admission.  Today, 11/01/2016: - INR is therapeutic remains therapeutic and table at 2.85 with dose held yesterday - Hgb down slightly, plt stable - no bleeding documented - no significant drug-drug intxns - heart healthy diet   Goal of Therapy:  INR 2-3 Monitor platelets by anticoagulation protocol: Yes   Plan:  - warfarin 2.5 mg PO x1 today - monitor for s/s bleeding  Jamontae Thwaites P 11/01/2016,11:19 AM

## 2016-11-01 NOTE — Progress Notes (Signed)
Progress Note  Patient Name: [name] Date of Encounter: 11/01/2016  Primary Cardiologist: Dr. [name]  Subjective   Feels better, improving leg swelling. No chest pain or dizziness.  Inpatient Medications    Scheduled Meds: . allopurinol  100 mg Oral Daily  . chlorhexidine  15 mL Mouth Rinse BID  . Chlorhexidine Gluconate Cloth  6 each Topical Q0600  . feeding supplement (ENSURE ENLIVE)  237 mL Oral BID BM  . folic acid  1 mg Oral Daily  . furosemide  80 mg Intravenous BID  . guaiFENesin  600 mg Oral BID  . metoprolol succinate  12.5 mg Oral Daily  . multivitamin with minerals  1 tablet Oral Daily  . mupirocin ointment  1 application Nasal BID  . potassium chloride SA  20 mEq Oral Daily  . pravastatin  10 mg Oral Daily  . sodium chloride flush  3 mL Intravenous Q12H  . thiamine  100 mg Oral Daily   Or  . thiamine  100 mg Intravenous Daily  . Warfarin - Pharmacist Dosing Inpatient   Does not apply q1800    PRN Meds: sodium chloride, acetaminophen, ipratropium-albuterol, LORazepam **OR** LORazepam, ondansetron (ZOFRAN) IV, sodium chloride flush   Vital Signs    Vitals:   10/31/16 1451 10/31/16 1748 10/31/16 2106 11/01/16 0628  BP: 102/61 (!) 115/51 (!) 97/56 (!) 93/54  Pulse: 88 64 61 63  Resp: 20  20 18   Temp: 97.2 F (36.2 C)  98.7 F (37.1 C) 98 F (36.7 C)  TempSrc: Oral  Oral Oral  SpO2: 100%  100% 97%  Weight:    285 lb 0.9 oz (129.3 kg)  Height:        Intake/Output Summary (Last 24 hours) at 11/01/16 0833 Last data filed at 11/01/16 0629  Gross per 24 hour  Intake              200 ml  Output             2450 ml  Net            -2250 ml   Filed Weights   10/30/16 0547 10/31/16 0633 11/01/16 0628  Weight: 293 lb 10.4 oz (133.2 kg) 281 lb 8.4 oz (127.7 kg) 285 lb 0.9 oz (129.3 kg)    Telemetry    I personally reviewed telemetry which shows atrial fibrillation.  ECG    I personally reviewed the tracing from 10/29/2016 which  shows atrial fibrillation with low voltage, IVCD, possible old anterolateral infarct pattern.  Physical Exam   GEN: No acute distress.  Neck: No JVD Cardiac: Irregularly irregular.  Respiratory:  diminished breath sounds at the bases. GI: Soft, nontender, non-distended  MS: 2+ edema and stasis; No deformity.  Labs    Chemistry  Recent Labs Lab 10/30/16 0409 10/31/16 0545 11/01/16 0550  NA 144 146* 141  K 4.4 4.4 4.1  CL 111 110 104  CO2 22 29 30   GLUCOSE 78 89 89  BUN 25* 25* 26*  CREATININE 1.04 1.02 1.12  CALCIUM 8.7* 8.7* 8.4*  PROT 7.4 6.6 6.2*  ALBUMIN 3.2* 3.0* 2.8*  AST 27 22 22   ALT 12* 11* 10*  ALKPHOS 141* 125 118  BILITOT 1.3* 1.5* 1.3*  GFRNONAA >60 >60 >60  GFRAA >60 >60 >60  ANIONGAP 11 7 7      Hematology  Recent Labs Lab 10/30/16 0409 10/31/16 0545 11/01/16 0550  WBC 5.0 5.4 4.7  RBC 3.31* 3.00* 2.99*  HGB 11.4* 10.3* 10.1*  HCT 35.5* 32.3* 32.1*  MCV 107.3* 107.7* 107.4*  MCH 34.4* 34.3* 33.8  MCHC 32.1 31.9 31.5  RDW 15.9* 15.8* 15.6*  PLT 186 186 170    Cardiac Enzymes  Recent Labs Lab 10/30/16 0409 10/30/16 0949 10/30/16 1603  TROPONINI <0.03 <0.03 <0.03     Recent Labs Lab 10/29/16 2340  TROPIPOC 0.02     BNP  Recent Labs Lab 10/29/16 2336  BNP 594.5*     Radiology    No results found.  Cardiac Studies   Echocardiogram 10/30/2016: Study Conclusions  - Left ventricle: The cavity size was normal. There was mild   concentric hypertrophy. Systolic function was normal. The   estimated ejection fraction was in the range of 50% to 55%. Wall   motion was normal; there were no regional wall motion   abnormalities. Doppler parameters are consistent with a   reversible restrictive pattern, indicative of decreased left   ventricular diastolic compliance and/or increased left atrial   pressure (grade 3 diastolic dysfunction). - Ventricular septum: The contour showed diastolic flattening. - Aortic valve:  Trileaflet; moderately thickened, moderately   calcified leaflets. - Aorta: The aorta was mildly calcified. - Mitral valve: Calcified annulus. There was moderate regurgitation   directed posteriorly. - Left atrium: The atrium was severely dilated. - Right ventricle: The cavity size was moderately dilated. Wall   thickness was normal. Systolic function was severely reduced. - Tricuspid valve: There was moderate regurgitation directed   eccentrically. - Pulmonary arteries: Systolic pressure was moderately increased.   PA peak pressure: 47 mm Hg (S). - Inferior vena cava: The vessel was normal in size. The   respirophasic diameter changes were blunted (< 50%), consistent   with elevated central venous pressure. - Pericardium, extracardiac: A trivial pericardial effusion was   identified. There was a left pleural effusion.  Impressions:  - LVEF is mildly reduced when compared to prior.  Patient Profile     71 y.o. male with history of presumed nonischemic cardiomyopathy, chronic atrial fibrillation, morbid obesity, hypertension, and status post recent fall with left knee injury. He has evidence of acute on chronic diastolic heart failure with significant volume overload. Presently diuresing on IV Lasix. LVEF 50-55% with grade 3 diastolic dysfunction by follow-up echocardiogram.  Assessment & Plan    1. Acute on chronic diastolic heart failure with volume overload. Follow-up echocardiogram shows LVEF 50-55% with grade 3 diastolic dysfunction. He continues to diurese on IV Lasix.  2. PVCs and brief bursts of NSVT by telemetry. Now on low dose Toprol XL.  3. Chronic atrial fibrillation, on Coumadin. INR 2.8.  4. Essential hypertension, blood pressure low normal.  Continue IV Lasix, follow-up BMET in a.m. Continue low-dose Toprol-XL if tolerates.  Signed, Nona Dell, MD  11/01/2016, 8:33 AM

## 2016-11-01 NOTE — Progress Notes (Signed)
PROGRESS NOTE    [name]  ONG:[id number] DOB: 1946/06/09 DOA: 10/29/2016 PCP: [facility]   Brief Narrative:  [name] is a 71 y.o. male with medical history significant of HTN, NICCM, Afib on Coumadin, morbid obesity; who presents after a fall.  Patient reported his legs being weak as the reason for him falling while trying to get out of bed. He notes falling on his buttocks with no trauma to his head or loss of consciousness. Thereafter, he reported complaints of right leg pain. Patient states that he is normally ambulatory with the help of a assistive device. Last fall was packed in December 2017. He previously reports having had physical therapy in the home, but states that they have done all that they can do for him at this time and stopped coming. Patient states that he been taking 80 mg of Lasix daily and denies any weight gain or cough. He reports that his weight should be around 280 pounds. Records show that the patient should be on 60 mg of Lasix daily. Associated symptoms that he has been  experiencing include increased sputum production, leg swelling, and generalized weakness over the last few weeks. Patient states that he previously was on medications for heart failure, but notes at some point in time that these were stopped but does not recall why. Patient is also enrolled in patient outreach and has frequent check enzymes. Per review of records patient's brother called and noted that the patient does not have adequate care at home and that a 67 year old father cannot keep taking care of him. Upon EMS arrival to the home he was noted to smell of urine and the home was noted to be in significant disarray.  Patient notes that he only drinks alcohol but has not drank in the last 1 week. Admitted for Acute Diastolic CHF Exacerbation. Patient agreeable to SNF if it is [facility]. [employer].   Assessment & Plan:   Principal Problem:  CHF exacerbation (HCC) Active Problems:   Acute respiratory failure with hypoxia (HCC)   Chronic atrial fibrillation (HCC)   Physical deconditioning   Fall   Alcohol abuse   V-tach [facility])  Acute Decompensation of Chronic Grade 3 Diastolic CHF in the setting of Non-ischemic Cardiomyopathy -Continue with Telemtery and Heart Failure Protocol -ECHO on 5/201 showed EF of 55-60% -Repeat TTE repeated and showed EF of 50-55% and Grade 3 Diastolic Dysfunction -Strict I's and O's, Daily Weights and Fluid Restriction -BNP was 594.5 and CXR howing significant signs for CHF exacerbation -C'w IV Diuresis with 80 mg of IV Lasix -Cardiology Following and appreciate Recc's; Started low dose Toprol XL because of Ventricular Ectopy -Possible addition of low-dose ARB if BP allows at a later day -Possible Transition to po Lasix in AM  Acute respiratory failure with Hypoxia -Patient seen with O2 saturations as low as 88% on room air. Oxygen saturation improved with 2 L nasal cannula oxygen. -Continuous pulse oximetry with nasal cannula oxygen and keep O2 saturations greater than 92%  -Wean O2 as Tolerated; Will need Walk Screen prior to D/C  Nonsustained [facility] -Had 12 beat Run last 10/30/16 -Cardiology Following and Added Toprol XL  Fall with Generalized Weakness and Deconditioning -Social work consult for SNF placement; Patient wants [place] -PT Evaluated and Treated and recommend SNF  A. Fib on chronic anticoagulation -CHADS2VASc at least 3 -Patient rate controlled without medication and has a therapeutic INR of 2.85 -Continue Coumadin per pharmacy -Cardiology  added Toprol XL  Macrocytic Anemia   - Initial hemoglobin 11 with elevated MCV and MCH.  - Hb/Hct went from 11.4/35.5 -> 10.3/32.3 -> 10.1/32.1 - B12 was 722 and Folate Pending  Hyperlipidemia  - Continue Pravastatin 10 mg po Daily   Alcohol abuse history - EtOH Levels <5 - Neuro checks - CWIA protocol  History of Gout -  Continue Allopurinol 100 mg po Daily  DVT prophylaxis: Anticoagulated with Coumadin Code Status: DO NOT RESUSCITATE Family Communication: No family present at bedside Disposition Plan: SNF if it is Education officer, environmental:   Cardiology  Procedures: ECHOCardiogram  Antimicrobials: None  Subjective: Seen and examined at bedside and he states his legs are less swollen. No Nausea or Vomiting and states he is doing well. No other complaints.   Objective: Vitals:   11/01/16 0846 11/01/16 1342 11/01/16 1507 11/01/16 1639  BP: 106/60 108/65    Pulse:  64    Resp:  16    Temp:  98.5 F (36.9 C)    TempSrc:  Oral    SpO2:  100% 98% 97%  Weight:      Height:        Intake/Output Summary (Last 24 hours) at 11/01/16 1658 Last data filed at 11/01/16 1624  Gross per 24 hour  Intake              200 ml  Output             3000 ml  Net            -2800 ml   Filed Weights   10/30/16 0547 10/31/16 0633 11/01/16 0628  Weight: 133.2 kg (293 lb 10.4 oz) 127.7 kg (281 lb 8.4 oz) 129.3 kg (285 lb 0.9 oz)   Examination: Physical Exam:  Constitutional: WN/WD obese, NAD and appears calm and comfortable Eyes: Lids and conjunctivae normal, sclerae anicteric  ENMT: External Ears, Nose appear normal. Grossly normal hearing.  Neck: Appears normal, supple, no cervical masses, normal ROM, no appreciable thyromegaly, some JVD Respiratory: Diminished to auscultation bilaterally, no wheezing, rales, rhonchi or crackles. Normal respiratory effort and patient is not tachypenic. No accessory muscle use.  Cardiovascular: Irregularly Irregular Rhythm and Rate, no murmurs / rubs / gallops. S1 and S2 auscultated. 1+ extremity edema.  Abdomen: Soft, non-tender, non-distended. No masses palpated. No appreciable hepatosplenomegaly. Bowel sounds positive x4.  GU: Deferred. Condom Catheter in place Musculoskeletal: No clubbing / cyanosis of digits/nails.  Skin: No rashes, lesions, ulcers on limited  examination. No induration; Warm and dry.  Neurologic: CN 2-12 grossly intact with no focal deficits.  Romberg sign cerebellar reflexes not assessed.  Psychiatric: Normal judgment and insight. Alert and oriented x 3. Normal mood and appropriate affect.   Data Reviewed: I have personally reviewed following labs and imaging studies  CBC:  Recent Labs Lab 10/29/16 2331 10/30/16 0409 10/31/16 0545 11/01/16 0550  WBC 5.0 5.0 5.4 4.7  NEUTROABS 3.9 3.6 4.0 3.3  HGB 11.0* 11.4* 10.3* 10.1*  HCT 34.4* 35.5* 32.3* 32.1*  MCV 106.8* 107.3* 107.7* 107.4*  PLT 179 186 186 170   Basic Metabolic Panel:  Recent Labs Lab 10/29/16 2331 10/30/16 0409 10/31/16 0545 11/01/16 0550  NA 144 144 146* 141  K 4.3 4.4 4.4 4.1  CL 111 111 110 104  CO2 27 22 29 30   GLUCOSE 96 78 89 89  BUN 25* 25* 25* 26*  CREATININE 1.08 1.04 1.02 1.12  CALCIUM 8.7* 8.7* 8.7* 8.4*  MG  --   --  1.8 1.7  PHOS  --   --  3.5 3.3   GFR: Estimated Creatinine Clearance: 87.8 mL/min (by C-G formula based on SCr of 1.12 mg/dL). Liver Function Tests:  Recent Labs Lab 10/29/16 2331 10/30/16 0409 10/31/16 0545 11/01/16 0550  AST 25 27 22 22   ALT 12* 12* 11* 10*  ALKPHOS 138* 141* 125 118  BILITOT 1.5* 1.3* 1.5* 1.3*  PROT 7.0 7.4 6.6 6.2*  ALBUMIN 3.2* 3.2* 3.0* 2.8*   No results for input(s): LIPASE, AMYLASE in the last 168 hours. No results for input(s): AMMONIA in the last 168 hours. Coagulation Profile:  Recent Labs Lab 10/29/16 2331 10/30/16 0409 10/31/16 0545 11/01/16 0550  INR 2.40 2.33 2.99 2.85   Cardiac Enzymes:  Recent Labs Lab 10/30/16 0409 10/30/16 0949 10/30/16 1603  TROPONINI <0.03 <0.03 <0.03   BNP (last 3 results) No results for input(s): PROBNP in the last 8760 hours. HbA1C: No results for input(s): HGBA1C in the last 72 hours. CBG: No results for input(s): GLUCAP in the last 168 hours. Lipid Profile: No results for input(s): CHOL, HDL, LDLCALC, TRIG, CHOLHDL, LDLDIRECT  in the last 72 hours. Thyroid Function Tests:  Recent Labs  10/30/16 0409  TSH 2.275   Anemia Panel:  Recent Labs  10/30/16 0409  VITAMINB12 722   Sepsis Labs:  Recent Labs Lab 10/29/16 2342  LATICACIDVEN 1.24    Recent Results (from the past 240 hour(s))  MRSA PCR Screening     Status: Abnormal   Collection Time: 10/30/16  3:04 AM  Result Value Ref Range Status   MRSA by PCR POSITIVE (A) NEGATIVE Final    Comment:        The GeneXpert MRSA Assay (FDA approved for NASAL specimens only), is one component of a comprehensive MRSA colonization surveillance program. It is not intended to diagnose MRSA infection nor to guide or monitor treatment for MRSA infections. RESULT CALLED TO, READ BACK BY AND VERIFIED WITH: SEAY,K @ 1030 ON [id number] BY POTEAT,S     Radiology Studies: No results found. Scheduled Meds: . allopurinol  100 mg Oral Daily  . chlorhexidine  15 mL Mouth Rinse BID  . Chlorhexidine Gluconate Cloth  6 each Topical Q0600  . feeding supplement (ENSURE ENLIVE)  237 mL Oral BID BM  . folic acid  1 mg Oral Daily  . furosemide  80 mg Intravenous BID  . guaiFENesin  600 mg Oral BID  . metoprolol succinate  12.5 mg Oral Daily  . multivitamin with minerals  1 tablet Oral Daily  . mupirocin ointment  1 application Nasal BID  . potassium chloride SA  20 mEq Oral Daily  . pravastatin  10 mg Oral Daily  . sodium chloride flush  3 mL Intravenous Q12H  . thiamine  100 mg Oral Daily  . warfarin  2.5 mg Oral ONCE-1800  . Warfarin - Pharmacist Dosing Inpatient   Does not apply q1800   Continuous Infusions:   LOS: 2 days   Merlene Laughter, DO [facility] Pager [phone]  If 7PM-7AM, please contact night-coverage [email or web address] Password [facility] 11/01/2016, 4:58 PM

## 2016-11-02 DIAGNOSIS — I5033 Acute on chronic diastolic (congestive) heart failure: Secondary | ICD-10-CM

## 2016-11-02 LAB — COMPREHENSIVE METABOLIC PANEL
ALK PHOS: 119 U/L (ref 38–126)
ALT: 9 U/L — ABNORMAL LOW (ref 17–63)
ANION GAP: 7 (ref 5–15)
AST: 25 U/L (ref 15–41)
Albumin: 2.9 g/dL — ABNORMAL LOW (ref 3.5–5.0)
BUN: 30 mg/dL — ABNORMAL HIGH (ref 6–20)
CALCIUM: 8.7 mg/dL — AB (ref 8.9–10.3)
CHLORIDE: 105 mmol/L (ref 101–111)
CO2: 31 mmol/L (ref 22–32)
CREATININE: 1.01 mg/dL (ref 0.61–1.24)
Glucose, Bld: 95 mg/dL (ref 65–99)
Potassium: 4.4 mmol/L (ref 3.5–5.1)
SODIUM: 143 mmol/L (ref 135–145)
Total Bilirubin: 1.4 mg/dL — ABNORMAL HIGH (ref 0.3–1.2)
Total Protein: 6.6 g/dL (ref 6.5–8.1)

## 2016-11-02 LAB — MAGNESIUM: MAGNESIUM: 1.7 mg/dL (ref 1.7–2.4)

## 2016-11-02 LAB — CBC WITH DIFFERENTIAL/PLATELET
Basophils Absolute: 0 10*3/uL (ref 0.0–0.1)
Basophils Relative: 0 %
EOS ABS: 0.2 10*3/uL (ref 0.0–0.7)
EOS PCT: 4 %
HCT: 32.6 % — ABNORMAL LOW (ref 39.0–52.0)
Hemoglobin: 10.3 g/dL — ABNORMAL LOW (ref 13.0–17.0)
LYMPHS ABS: 0.9 10*3/uL (ref 0.7–4.0)
LYMPHS PCT: 19 %
MCH: 33.7 pg (ref 26.0–34.0)
MCHC: 31.6 g/dL (ref 30.0–36.0)
MCV: 106.5 fL — AB (ref 78.0–100.0)
MONOS PCT: 9 %
Monocytes Absolute: 0.4 10*3/uL (ref 0.1–1.0)
Neutro Abs: 3.3 10*3/uL (ref 1.7–7.7)
Neutrophils Relative %: 68 %
PLATELETS: 180 10*3/uL (ref 150–400)
RBC: 3.06 MIL/uL — ABNORMAL LOW (ref 4.22–5.81)
RDW: 15.5 % (ref 11.5–15.5)
WBC: 4.8 10*3/uL (ref 4.0–10.5)

## 2016-11-02 LAB — PHOSPHORUS: PHOSPHORUS: 3.3 mg/dL (ref 2.5–4.6)

## 2016-11-02 LAB — PROTIME-INR
INR: 2.66
Prothrombin Time: 28.9 seconds — ABNORMAL HIGH (ref 11.4–15.2)

## 2016-11-02 MED ORDER — WARFARIN SODIUM 2.5 MG PO TABS
2.5000 mg | ORAL_TABLET | Freq: Once | ORAL | Status: AC
  Administered 2016-11-02: 2.5 mg via ORAL
  Filled 2016-11-02: qty 1

## 2016-11-02 NOTE — Progress Notes (Signed)
PT Cancellation Note  Patient Details Name: [name] MRN: [id number] DOB: [birth year]   Cancelled Treatment:    Reason Eval/Treat Not Completed: Fatigue/lethargy limiting ability to participate. Pt reports being fatigued after sitting up in chair for several hours. He politely requested PT check back another day.    Rebeca Alert, MPT Pager: [phone]

## 2016-11-02 NOTE — Progress Notes (Signed)
ANTICOAGULATION CONSULT NOTE - Follow Up Consult  Pharmacy Consult for warfarin Indication: hx  atrial fibrillation  No Known Allergies  Patient Measurements: Height: 6\' 1"  (185.4 cm) Weight: 282 lb 10.1 oz (128.2 kg) IBW/kg (Calculated) : 79.9 Heparin Dosing Weight:  Vital Signs: Temp: 98.1 F (36.7 C) (01/22 0625) Temp Source: Oral (01/22 0625) BP: 115/76 (01/22 0625) Pulse Rate: 67 (01/22 0625)  Labs:  Recent Labs  10/30/16 1603  10/31/16 0545 11/01/16 0550 11/02/16 0529  HGB  --   < > 10.3* 10.1* 10.3*  HCT  --   --  32.3* 32.1* 32.6*  PLT  --   --  186 170 180  LABPROT  --   --  31.7* 30.5* 28.9*  INR  --   --  2.99 2.85 2.66  CREATININE  --   --  1.02 1.12 1.01  TROPONINI <0.03  --   --   --   --   < > = values in this interval not displayed.  Estimated Creatinine Clearance: 96.9 mL/min (by C-G formula based on SCr of 1.01 mg/dL).   Medications:  Home warfarin regimen: 5 mg daily except 2.5 mg on MWF (last [facility] clinic note in Dec said dose adjusted to 5 mg daily except 2.5 mg on MF d/t low INR, but pt never changed to this new dose -- he continued taking 5 mg daily except 2.5 mg MWF).  INR was therapeutic on admission at 2.40.  Assessment: Patient's a 71 y.o M with hx of afib on warfarin PTA, presented to the ED on 10/30/16 s/p fall.  Warfarin resumed on admission.  Today, 11/02/2016: - INR continues to be therapeutic this AM - has received inpatient doses of 7.5mg , 0mg , and 2.5mg  - CBC stable - no bleeding documented - no significant drug-drug intxns - heart healthy diet   Goal of Therapy:  INR 2-3 Monitor platelets by anticoagulation protocol: Yes   Plan:  1) Repeat warfarin 2.5mg  this evening 2) Daily INR   Hessie Knows, PharmD, BCPS Pager [phone] 11/02/2016 10:39 AM

## 2016-11-02 NOTE — Progress Notes (Signed)
PROGRESS NOTE    [name]  ZOX:[id number] DOB: 12-Dec-1945 DOA: 10/29/2016 PCP: [facility]   Brief Narrative:  [name] is a 71 y.o. male with medical history significant of HTN, NICCM, Afib on Coumadin, morbid obesity; who presents after a fall.  Patient reported his legs being weak as the reason for him falling while trying to get out of bed. He notes falling on his buttocks with no trauma to his head or loss of consciousness. Thereafter, he reported complaints of right leg pain. Patient states that he is normally ambulatory with the help of a assistive device. Last fall was packed in December 2017. He previously reports having had physical therapy in the home, but states that they have done all that they can do for him at this time and stopped coming. Patient states that he been taking 80 mg of Lasix daily and denies any weight gain or cough. He reports that his weight should be around 280 pounds. Records show that the patient should be on 60 mg of Lasix daily. Associated symptoms that he has been  experiencing include increased sputum production, leg swelling, and generalized weakness over the last few weeks. Patient states that he previously was on medications for heart failure, but notes at some point in time that these were stopped but does not recall why. Patient is also enrolled in patient outreach and has frequent check enzymes. Per review of records patient's brother called and noted that the patient does not have adequate care at home and that a 62 year old father cannot keep taking care of him. Upon EMS arrival to the home he was noted to smell of urine and the home was noted to be in significant disarray.  Patient notes that he only drinks alcohol but has not drank in the last 1 week. Admitted for Acute Diastolic CHF Exacerbation. Patient agreeable to SNF if it is [facility]. [employer] work. Still being diuresed with IV lasix.  Assessment  & Plan:   Principal Problem:   CHF exacerbation (HCC) Active Problems:   Acute respiratory failure with hypoxia (HCC)   Chronic atrial fibrillation (HCC)   Physical deconditioning   Fall   Alcohol abuse   V-tach [facility])  Acute Decompensation of Chronic Grade 3 Diastolic CHF in the setting of Non-ischemic Cardiomyopathy -Continue with Telemtery and Heart Failure Protocol -ECHO on 5/201 showed EF of 55-60% -Repeat TTE repeated and showed EF of 50-55% and Grade 3 Diastolic Dysfunction -Strict I's and O's, Daily Weights and Fluid Restriction -BNP was 594.5 and CXR howing significant signs for CHF exacerbation -C'w IV Diuresis with 80 mg of IV Lasix; Per Cardiology may be able to switch to po 80 mg BID if renal fxn worsens -Cardiology Following and appreciate Recc's; Started low dose Toprol XL because of Ventricular Ectopy -Possible addition of low-dose ARB if BP allows at a later day -Possible Transition to po Lasix in AM per Cardiology -Sees Dr. Antoine Poche as an outpatient.  Acute respiratory failure with Hypoxia -Patient seen with O2 saturations as low as 88% on room air. Oxygen saturation improved with 2 L nasal cannula oxygen. -Continuous pulse oximetry with nasal cannula oxygen and keep O2 saturations greater than 92%  -Continue Incentive Spirometry  -Wean O2 as Tolerated; Will need Walk Screen prior to D/C  Nonsustained [employer] P[facility] and PVCs -Had 12 beat Run on 10/30/16 -Cardiology Following and Added Toprol XL -C/w Remote Telemetry  Fall with Generalized Weakness and Deconditioning -Social work consult  for SNF placement; [facility] wants [facility] -PT Evaluated and Treated and recommend SNF  A. Fib on chronic anticoagulation -CHADS2VASc at least 3 -Patient rate controlled without medication and has a therapeutic INR of 2.66 -Continue Coumadin per pharmacy -Cardiology added Toprol XL  Macrocytic Anemia   - Initial hemoglobin 11 with elevated MCV and MCH.  - Hb/Hct today at  10.3/32.6 - B12 was 722 and Folate Pending  Hyperlipidemia  - Continue Pravastatin 10 mg po Daily   Alcohol abuse history - EtOH Levels <5 - Neuro checks - C/w Folic Acid, MVI, and Thaimine; CIWA Protocol discontineued   History of Gout - Continue Allopurinol 100 mg po Daily  DVT prophylaxis: Anticoagulated with Coumadin Code Status: DO NOT RESUSCITATE Family Communication: No family present at bedside Disposition Plan: SNF when stable   Consultants:   Cardiology  Procedures: ECHOCardiogram  Antimicrobials: None  Subjective: Seen and examined at bedside and thinks legs are less swollen. States he was able to ambulate to the rest room with assistance. No nausea/vomiting.    Objective: Vitals:   11/01/16 1507 11/01/16 1639 11/01/16 2032 11/02/16 0625  BP:   112/61 115/76  Pulse:   65 67  Resp:    16  Temp:   98.9 F (37.2 C) 98.1 F (36.7 C)  TempSrc:   Oral Oral  SpO2: 98% 97% 94% 98%  Weight:    128.2 kg (282 lb 10.1 oz)  Height:        Intake/Output Summary (Last 24 hours) at 11/02/16 0739 Last data filed at 11/02/16 0600  Gross per 24 hour  Intake              720 ml  Output             2800 ml  Net            -2080 ml   Filed Weights   10/31/16 0633 11/01/16 0628 11/02/16 0625  Weight: 127.7 kg (281 lb 8.4 oz) 129.3 kg (285 lb 0.9 oz) 128.2 kg (282 lb 10.1 oz)   Examination: Physical Exam:  Constitutional: WN/WD obese, NAD and appears calm and comfortable sitting bedside Eyes: Lids and conjunctivae normal, sclerae anicteric  ENMT: External Ears, Nose appear normal. Grossly normal hearing.  Neck: Appears normal, supple, no cervical masses, normal ROM, no appreciable thyromegaly, some JVD Respiratory: Diminished to auscultation bilaterally, no wheezing, rales, rhonchi or crackles. Normal respiratory effort and patient is not tachypenic. No accessory muscle use.  Cardiovascular: Irregularly Irregular Rhythm and Rate, no murmurs / rubs / gallops. S1  and S2 auscultated. 1+ extremity edema.  Abdomen: Soft, non-tender, non-distended. No masses palpated. No appreciable hepatosplenomegaly. Bowel sounds positive x4.  GU: Deferred. Condom Catheter in place draining clear yellow urine.  Musculoskeletal: No clubbing / cyanosis of digits/nails.  Skin: No rashes, lesions, ulcers on limited examination. No induration; Warm and dry.  Neurologic: CN 2-12 grossly intact with no focal deficits.  Romberg sign cerebellar reflexes not assessed.  Psychiatric: Normal judgment and insight. Alert and oriented x 3. Normal mood and appropriate affect.   Data Reviewed: I have personally reviewed following labs and imaging studies  CBC:  Recent Labs Lab 10/29/16 2331 10/30/16 0409 10/31/16 0545 11/01/16 0550 11/02/16 0529  WBC 5.0 5.0 5.4 4.7 4.8  NEUTROABS 3.9 3.6 4.0 3.3 3.3  HGB 11.0* 11.4* 10.3* 10.1* 10.3*  HCT 34.4* 35.5* 32.3* 32.1* 32.6*  MCV 106.8* 107.3* 107.7* 107.4* 106.5*  PLT 179 186 186 170 180  Basic Metabolic Panel:  Recent Labs Lab 10/29/16 2331 10/30/16 0409 10/31/16 0545 11/01/16 0550 11/02/16 0529  NA 144 144 146* 141 143  K 4.3 4.4 4.4 4.1 4.4  CL 111 111 110 104 105  CO2 27 22 29 30 31   GLUCOSE 96 78 89 89 95  BUN 25* 25* 25* 26* 30*  CREATININE 1.08 1.04 1.02 1.12 1.01  CALCIUM 8.7* 8.7* 8.7* 8.4* 8.7*  MG  --   --  1.8 1.7 1.7  PHOS  --   --  3.5 3.3 3.3   GFR: Estimated Creatinine Clearance: 96.9 mL/min (by C-G formula based on SCr of 1.01 mg/dL). Liver Function Tests:  Recent Labs Lab 10/29/16 2331 10/30/16 0409 10/31/16 0545 11/01/16 0550 11/02/16 0529  AST 25 27 22 22 25   ALT 12* 12* 11* 10* 9*  ALKPHOS 138* 141* 125 118 119  BILITOT 1.5* 1.3* 1.5* 1.3* 1.4*  PROT 7.0 7.4 6.6 6.2* 6.6  ALBUMIN 3.2* 3.2* 3.0* 2.8* 2.9*   No results for input(s): LIPASE, AMYLASE in the last 168 hours. No results for input(s): AMMONIA in the last 168 hours. Coagulation Profile:  Recent Labs Lab 10/29/16 2331  10/30/16 0409 10/31/16 0545 11/01/16 0550 11/02/16 0529  INR 2.40 2.33 2.99 2.85 2.66   Cardiac Enzymes:  Recent Labs Lab 10/30/16 0409 10/30/16 0949 10/30/16 1603  TROPONINI <0.03 <0.03 <0.03   BNP (last 3 results) No results for input(s): PROBNP in the last 8760 hours. HbA1C: No results for input(s): HGBA1C in the last 72 hours. CBG: No results for input(s): GLUCAP in the last 168 hours. Lipid Profile: No results for input(s): CHOL, HDL, LDLCALC, TRIG, CHOLHDL, LDLDIRECT in the last 72 hours. Thyroid Function Tests: No results for input(s): TSH, T4TOTAL, FREET4, T3FREE, THYROIDAB in the last 72 hours. Anemia Panel: No results for input(s): VITAMINB12, FOLATE, FERRITIN, TIBC, IRON, RETICCTPCT in the last 72 hours. Sepsis Labs:  Recent Labs Lab 10/29/16 2342  LATICACIDVEN 1.24    Recent Results (from the past 240 hour(s))  MRSA PCR Screening     Status: Abnormal   Collection Time: 10/30/16  3:04 AM  Result Value Ref Range Status   MRSA by PCR POSITIVE (A) NEGATIVE Final    Comment:        The GeneXpert MRSA Assay (FDA approved for NASAL specimens only), is one component of a comprehensive MRSA colonization surveillance program. It is not intended to diagnose MRSA infection nor to guide or monitor treatment for MRSA infections. RESULT CALLED TO, READ BACK BY AND VERIFIED WITH: SEAY,K @ 1030 ON [id number] BY POTEAT,S     Radiology Studies: No results found. Scheduled Meds: . allopurinol  100 mg Oral Daily  . chlorhexidine  15 mL Mouth Rinse BID  . Chlorhexidine Gluconate Cloth  6 each Topical Q0600  . feeding supplement (ENSURE ENLIVE)  237 mL Oral BID BM  . folic acid  1 mg Oral Daily  . furosemide  80 mg Intravenous BID  . guaiFENesin  600 mg Oral BID  . metoprolol succinate  12.5 mg Oral Daily  . multivitamin with minerals  1 tablet Oral Daily  . mupirocin ointment  1 application Nasal BID  . potassium chloride SA  20 mEq Oral Daily  . pravastatin  10  mg Oral Daily  . sodium chloride flush  3 mL Intravenous Q12H  . thiamine  100 mg Oral Daily  . Warfarin - Pharmacist Dosing Inpatient   Does not apply q1800   Continuous Infusions:  LOS: 3 days   Merlene Laughter, DO [facility] Pager [phone]  If 7PM-7AM, please contact night-coverage [email or web address] Password [facility] 11/02/2016, 7:39 AM

## 2016-11-02 NOTE — Progress Notes (Signed)
Progress Note  Patient Name: [name] Date of Encounter: 11/02/2016  Primary Cardiologist: Dr. Antoine Poche  Subjective   Breathing at baseline. No chest discomfort or palpitations. Ambulated around the room this AM with his walker.   Inpatient Medications    Scheduled Meds: . allopurinol  100 mg Oral Daily  . chlorhexidine  15 mL Mouth Rinse BID  . Chlorhexidine Gluconate Cloth  6 each Topical Q0600  . feeding supplement (ENSURE ENLIVE)  237 mL Oral BID BM  . folic acid  1 mg Oral Daily  . furosemide  80 mg Intravenous BID  . guaiFENesin  600 mg Oral BID  . metoprolol succinate  12.5 mg Oral Daily  . multivitamin with minerals  1 tablet Oral Daily  . mupirocin ointment  1 application Nasal BID  . potassium chloride SA  20 mEq Oral Daily  . pravastatin  10 mg Oral Daily  . sodium chloride flush  3 mL Intravenous Q12H  . thiamine  100 mg Oral Daily  . Warfarin - Pharmacist Dosing Inpatient   Does not apply q1800   Continuous Infusions:  PRN Meds: sodium chloride, acetaminophen, ipratropium-albuterol, ondansetron (ZOFRAN) IV, sodium chloride flush   Vital Signs    Vitals:   11/01/16 1507 11/01/16 1639 11/01/16 2032 11/02/16 0625  BP:   112/61 115/76  Pulse:   65 67  Resp:    16  Temp:   98.9 F (37.2 C) 98.1 F (36.7 C)  TempSrc:   Oral Oral  SpO2: 98% 97% 94% 98%  Weight:    282 lb 10.1 oz (128.2 kg)  Height:        Intake/Output Summary (Last 24 hours) at 11/02/16 0843 Last data filed at 11/02/16 0600  Gross per 24 hour  Intake              720 ml  Output             2800 ml  Net            -2080 ml   Filed Weights   10/31/16 0633 11/01/16 0628 11/02/16 0625  Weight: 281 lb 8.4 oz (127.7 kg) 285 lb 0.9 oz (129.3 kg) 282 lb 10.1 oz (128.2 kg)    Telemetry    Atrial fibrillation, HR in mid-50's to 60's.  - Personally Reviewed  ECG    No new tracings.   Physical Exam   GEN: Pleasant, Caucasian male appearing in no acute distress.  Neck: JVD  at 8cm. No bruits appreciated.  Cardiac: Irregularly irregular, no murmurs, rubs, or gallops. 2+ pitting edema up to knees bilaterally. Respiratory: Clear to auscultation bilaterally. No wheezing or rales. GI: Soft, nontender, non-distended  MS: No deformity. Neuro:  AAOx3. Psych: Normal affect  Labs    Chemistry Recent Labs Lab 10/31/16 0545 11/01/16 0550 11/02/16 0529  NA 146* 141 143  K 4.4 4.1 4.4  CL 110 104 105  CO2 29 30 31   GLUCOSE 89 89 95  BUN 25* 26* 30*  CREATININE 1.02 1.12 1.01  CALCIUM 8.7* 8.4* 8.7*  PROT 6.6 6.2* 6.6  ALBUMIN 3.0* 2.8* 2.9*  AST 22 22 25   ALT 11* 10* 9*  ALKPHOS 125 118 119  BILITOT 1.5* 1.3* 1.4*  GFRNONAA >60 >60 >60  GFRAA >60 >60 >60  ANIONGAP 7 7 7      Hematology Recent Labs Lab 10/31/16 0545 11/01/16 0550 11/02/16 0529  WBC 5.4 4.7 4.8  RBC 3.00* 2.99* 3.06*  HGB 10.3*  10.1* 10.3*  HCT 32.3* 32.1* 32.6*  MCV 107.7* 107.4* 106.5*  MCH 34.3* 33.8 33.7  MCHC 31.9 31.5 31.6  RDW 15.8* 15.6* 15.5  PLT 186 170 180    Cardiac Enzymes Recent Labs Lab 10/30/16 0409 10/30/16 0949 10/30/16 1603  TROPONINI <0.03 <0.03 <0.03    Recent Labs Lab 10/29/16 2340  TROPIPOC 0.02     BNP Recent Labs Lab 10/29/16 2336  BNP 594.5*     DDimer No results for input(s): DDIMER in the last 168 hours.   Radiology    No results found.  Cardiac Studies   Echocardiogram: 10/30/2016 Study Conclusions  - Left ventricle: The cavity size was normal. There was mild   concentric hypertrophy. Systolic function was normal. The   estimated ejection fraction was in the range of 50% to 55%. Wall   motion was normal; there were no regional wall motion   abnormalities. Doppler parameters are consistent with a   reversible restrictive pattern, indicative of decreased left   ventricular diastolic compliance and/or increased left atrial   pressure (grade 3 diastolic dysfunction). - Ventricular septum: The contour showed diastolic  flattening. - Aortic valve: Trileaflet; moderately thickened, moderately   calcified leaflets. - Aorta: The aorta was mildly calcified. - Mitral valve: Calcified annulus. There was moderate regurgitation   directed posteriorly. - Left atrium: The atrium was severely dilated. - Right ventricle: The cavity size was moderately dilated. Wall   thickness was normal. Systolic function was severely reduced. - Tricuspid valve: There was moderate regurgitation directed   eccentrically. - Pulmonary arteries: Systolic pressure was moderately increased.   PA peak pressure: 47 mm Hg (S). - Inferior vena cava: The vessel was normal in size. The   respirophasic diameter changes were blunted (< 50%), consistent   with elevated central venous pressure. - Pericardium, extracardiac: A trivial pericardial effusion was   identified. There was a left pleural effusion.  Impressions:  - LVEF is mildly reduced when compared to prior.  Patient Profile     71 y.o. male w/ PMH of presumed nonischemic cardiomyopathy (EF 35-40% in 2003, improved to 50-55% by echo this admission), chronic atrial fibrillation, morbid obesity, HTN, and s/p recent fall with left knee injury who presented for lower extremity weakness and was found to have an acute CHF exacerbation.   Assessment & Plan    1. Acute on chronic diastolic heart failure  - echo this admission shows LVEF of 50-55% with grade 3 diastolic dysfunction. BNP 594 on admission with CXR showing cardiomegaly with CHF and probable small left effusion with atelectasis. - started on IV Lasix 80mg  BID with a net recorded output of -7.2L thus far. Weight down 11 lbs (293 lbs --> 282 lbs). Reports weights at home were 294 - 295 lbs. Creatinine stable at 1.01. Continue with IV diuresis as he still appears volume overloaded on physical examination.   2. PVC's and brief bursts of NSVT  - by telemetry on 10/31/2016. Started on low-dose Toprol-XL at that time. - continue to  monitor on telemetry.  3. Chronic atrial fibrillation - This patients CHA2DS2-VASc Score and unadjusted Ischemic Stroke Rate (% per year) is equal to 3.2 % stroke rate/year from a score of 3 (HTN, CHF, Age). On Coumadin. INR 2.66 this AM.  - continue Toprol-XL 12.5mg  daily for rate control.   4. Essential hypertension - BP at 108/61 - 115/76 in the past 24 hours. - continue current medication regimen.   5. Fall - secondary to reported  bilateral leg weakness. No syncope reported.  - PT Eval completed on admission with recommendation of SNF at time of discharge. - per admitting team   Signed, Ellsworth Lennox, PA  11/02/2016, 8:43 AM

## 2016-11-02 NOTE — Clinical Social Work Placement (Signed)
Patient has a bed at [facility], pending [facility] authorization - Janie at [place] to submit clinicals for authorization today. Per Dr. Marland Mcalpine not ready for discharge yet. Patient's brother, Gene to complete admission paperwork at [facility] this afternoon.    Lincoln Maxin, LCSW [facility] Clinical Social Worker cell #: [phone]    CLINICAL SOCIAL WORK PLACEMENT  NOTE  Date:  11/02/2016  Patient Details  Name: [name] MRN: [id number] Date of Birth: April 19, 1946  Clinical Social Work is seeking post-discharge placement for this patient at the Skilled  Nursing Facility level of care (*CSW will initial, date and re-position this form in  chart as items are completed):  Yes   Patient/family provided with [facility] Clinical Social Work Department's list of facilities offering this level of care within the geographic area requested by the patient (or if unable, by the patient's family).  Yes   Patient/family informed of their freedom to choose among providers that offer the needed level of care, that participate in Medicare, Medicaid or managed care program needed by the patient, have an available bed and are willing to accept the patient.  Yes   Patient/family informed of 's ownership interest in [facility] and [facility], as well as of the fact that they are under no obligation to receive care at these facilities.  PASRR submitted to EDS on       PASRR number received on       Existing PASRR number confirmed on 10/30/16     FL2 transmitted to all facilities in geographic area requested by pt/family on 10/30/16     FL2 transmitted to all facilities within larger geographic area on       Patient informed that his/her managed care company has contracts with or will negotiate with certain facilities, including the following:        Yes   Patient/family informed of bed offers received.  Patient chooses bed at  [facility]     Physician recommends and patient chooses bed at      Patient to be transferred to [facility] on  .  Patient to be transferred to facility by       Patient family notified on   of transfer.  Name of family member notified:        PHYSICIAN       Additional Comment:    _______________________________________________ Arlyss Repress, LCSW 11/02/2016, 12:19 PM

## 2016-11-03 DIAGNOSIS — W19XXXA Unspecified fall, initial encounter: Secondary | ICD-10-CM

## 2016-11-03 DIAGNOSIS — I110 Hypertensive heart disease with heart failure: Principal | ICD-10-CM

## 2016-11-03 DIAGNOSIS — J9601 Acute respiratory failure with hypoxia: Secondary | ICD-10-CM

## 2016-11-03 DIAGNOSIS — I5043 Acute on chronic combined systolic (congestive) and diastolic (congestive) heart failure: Secondary | ICD-10-CM

## 2016-11-03 LAB — PROTIME-INR
INR: 2.43
Prothrombin Time: 26.9 seconds — ABNORMAL HIGH (ref 11.4–15.2)

## 2016-11-03 LAB — COMPREHENSIVE METABOLIC PANEL
ALT: 10 U/L — ABNORMAL LOW (ref 17–63)
ANION GAP: 9 (ref 5–15)
AST: 25 U/L (ref 15–41)
Albumin: 2.9 g/dL — ABNORMAL LOW (ref 3.5–5.0)
Alkaline Phosphatase: 117 U/L (ref 38–126)
BUN: 38 mg/dL — ABNORMAL HIGH (ref 6–20)
CHLORIDE: 102 mmol/L (ref 101–111)
CO2: 31 mmol/L (ref 22–32)
Calcium: 8.8 mg/dL — ABNORMAL LOW (ref 8.9–10.3)
Creatinine, Ser: 1.18 mg/dL (ref 0.61–1.24)
Glucose, Bld: 98 mg/dL (ref 65–99)
POTASSIUM: 4.1 mmol/L (ref 3.5–5.1)
Sodium: 142 mmol/L (ref 135–145)
Total Bilirubin: 1.3 mg/dL — ABNORMAL HIGH (ref 0.3–1.2)
Total Protein: 6.5 g/dL (ref 6.5–8.1)

## 2016-11-03 LAB — CBC WITH DIFFERENTIAL/PLATELET
Basophils Absolute: 0 10*3/uL (ref 0.0–0.1)
Basophils Relative: 1 %
EOS ABS: 0.2 10*3/uL (ref 0.0–0.7)
EOS PCT: 4 %
HCT: 33.3 % — ABNORMAL LOW (ref 39.0–52.0)
Hemoglobin: 10.8 g/dL — ABNORMAL LOW (ref 13.0–17.0)
LYMPHS ABS: 0.9 10*3/uL (ref 0.7–4.0)
LYMPHS PCT: 25 %
MCH: 33.9 pg (ref 26.0–34.0)
MCHC: 32.4 g/dL (ref 30.0–36.0)
MCV: 104.4 fL — AB (ref 78.0–100.0)
MONO ABS: 0.3 10*3/uL (ref 0.1–1.0)
Monocytes Relative: 9 %
Neutro Abs: 2.3 10*3/uL (ref 1.7–7.7)
Neutrophils Relative %: 61 %
PLATELETS: 178 10*3/uL (ref 150–400)
RBC: 3.19 MIL/uL — ABNORMAL LOW (ref 4.22–5.81)
RDW: 15.2 % (ref 11.5–15.5)
WBC: 3.7 10*3/uL — ABNORMAL LOW (ref 4.0–10.5)

## 2016-11-03 LAB — FOLATE RBC
Folate, Hemolysate: 455.5 ng/mL
Folate, RBC: 1328 ng/mL (ref >498)
Hematocrit: 34.3 % — ABNORMAL LOW (ref 37.5–51.0)

## 2016-11-03 LAB — MAGNESIUM: MAGNESIUM: 1.8 mg/dL (ref 1.7–2.4)

## 2016-11-03 LAB — PHOSPHORUS: PHOSPHORUS: 3.4 mg/dL (ref 2.5–4.6)

## 2016-11-03 MED ORDER — FUROSEMIDE 40 MG PO TABS
80.0000 mg | ORAL_TABLET | Freq: Two times a day (BID) | ORAL | Status: DC
  Administered 2016-11-03 – 2016-11-04 (×2): 80 mg via ORAL
  Filled 2016-11-03 (×2): qty 2

## 2016-11-03 MED ORDER — WARFARIN SODIUM 2.5 MG PO TABS
2.5000 mg | ORAL_TABLET | Freq: Once | ORAL | Status: AC
  Administered 2016-11-03: 2.5 mg via ORAL
  Filled 2016-11-03: qty 1

## 2016-11-03 NOTE — Progress Notes (Signed)
Progress Note  Patient Name: [name] Date of Encounter: 11/03/2016  Primary Cardiologist: Dr. Antoine Poche  Subjective   Pt states he feels much better today, is urinating frequently, and has improved LE edema.  Inpatient Medications    Scheduled Meds:  allopurinol  100 mg Oral Daily   chlorhexidine  15 mL Mouth Rinse BID   Chlorhexidine Gluconate Cloth  6 each Topical Q0600   feeding supplement (ENSURE ENLIVE)  237 mL Oral BID BM   folic acid  1 mg Oral Daily   furosemide  80 mg Intravenous BID   guaiFENesin  600 mg Oral BID   metoprolol succinate  12.5 mg Oral Daily   multivitamin with minerals  1 tablet Oral Daily   mupirocin ointment  1 application Nasal BID   potassium chloride SA  20 mEq Oral Daily   pravastatin  10 mg Oral Daily   sodium chloride flush  3 mL Intravenous Q12H   thiamine  100 mg Oral Daily   Warfarin - Pharmacist Dosing Inpatient   Does not apply q1800   Continuous Infusions:  PRN Meds: sodium chloride, acetaminophen, ipratropium-albuterol, ondansetron (ZOFRAN) IV, sodium chloride flush   Vital Signs    Vitals:   11/02/16 0625 11/02/16 1413 11/02/16 2059 11/03/16 0518  BP: 115/76 128/75 (!) 107/58 99/64  Pulse: 67 68 63 (!) 55  Resp: 16 18 18 18   Temp: 98.1 F (36.7 C) 97.9 F (36.6 C) 99.3 F (37.4 C) 98.7 F (37.1 C)  TempSrc: Oral Oral Oral Oral  SpO2: 98% 93% 93% 94%  Weight: 282 lb 10.1 oz (128.2 kg)   261 lb 0.4 oz (118.4 kg)  Height:        Intake/Output Summary (Last 24 hours) at 11/03/16 1011 Last data filed at 11/03/16 0703  Gross per 24 hour  Intake              480 ml  Output             3050 ml  Net            -2570 ml   Filed Weights   11/01/16 0628 11/02/16 0625 11/03/16 0518  Weight: 285 lb 0.9 oz (129.3 kg) 282 lb 10.1 oz (128.2 kg) 261 lb 0.4 oz (118.4 kg)    Telemetry    Atrial fibrillation in the 60-70s with PVCs - Personally Reviewed  ECG    Afib with ventricular rate 66 on 11/01/16  - Personally Reviewed  Physical Exam   GEN: Well nourished, well developed, in no acute distress, answers questions appropriately HEENT: Grossly normal.  Neck: Supple, JVD at 8cm, no carotid bruits appreciated Cardiac: irregular rhythm with regular rate, no murmurs, rubs, or gallops.  Extremities:  Radial pulses 2+ and equal bilaterally, pedal pulses diminished secondary to edema, equal bilaterally. 1+ to 2+ B LE edema up to knees, compression socks present on both legs  Respiratory:  Respirations regular and unlabored, clear to auscultation bilaterally. GI: Soft, nontender, nondistended, BS + x 4. MS: no deformity Skin: warm and dry, venous stasis skin changes on B LE Neuro:  AAOx3 Psych: Normal affect.  Labs    Chemistry Recent Labs Lab 11/01/16 0550 11/02/16 0529 11/03/16 0549  NA 141 143 142  K 4.1 4.4 4.1  CL 104 105 102  CO2 30 31 31   GLUCOSE 89 95 98  BUN 26* 30* 38*  CREATININE 1.12 1.01 1.18  CALCIUM 8.4* 8.7* 8.8*  PROT 6.2* 6.6 6.5  ALBUMIN 2.8* 2.9* 2.9*  AST 22 25 25   ALT 10* 9* 10*  ALKPHOS 118 119 117  BILITOT 1.3* 1.4* 1.3*  GFRNONAA >60 >60 >60  GFRAA >60 >60 >60  ANIONGAP 7 7 9      Hematology Recent Labs Lab 11/01/16 0550 11/02/16 0529 11/03/16 0549  WBC 4.7 4.8 3.7*  RBC 2.99* 3.06* 3.19*  HGB 10.1* 10.3* 10.8*  HCT 32.1* 32.6* 33.3*  MCV 107.4* 106.5* 104.4*  MCH 33.8 33.7 33.9  MCHC 31.5 31.6 32.4  RDW 15.6* 15.5 15.2  PLT 170 180 178    Cardiac Enzymes Recent Labs Lab 10/30/16 0409 10/30/16 0949 10/30/16 1603  TROPONINI <0.03 <0.03 <0.03    Recent Labs Lab 10/29/16 2340  TROPIPOC 0.02     BNP Recent Labs Lab 10/29/16 2336  BNP 594.5*     DDimer No results for input(s): DDIMER in the last 168 hours.   Radiology    No results found.   Cardiac Studies   Echocardiogram: 10/30/2016 Study Conclusions  - Left ventricle: The cavity size was normal. There was mild concentric hypertrophy. Systolic function  was normal. The estimated ejection fraction was in the range of 50% to 55%. Wall motion was normal; there were no regional wall motion abnormalities. Doppler parameters are consistent with a reversible restrictive pattern, indicative of decreased left ventricular diastolic compliance and/or increased left atrial pressure (grade 3 diastolic dysfunction). - Ventricular septum: The contour showed diastolic flattening. - Aortic valve: Trileaflet; moderately thickened, moderately calcified leaflets. - Aorta: The aorta was mildly calcified. - Mitral valve: Calcified annulus. There was moderate regurgitation directed posteriorly. - Left atrium: The atrium was severely dilated. - Right ventricle: The cavity size was moderately dilated. Wall thickness was normal. Systolic function was severely reduced. - Tricuspid valve: There was moderate regurgitation directed eccentrically. - Pulmonary arteries: Systolic pressure was moderately increased. PA peak pressure: 47 mm Hg (S). - Inferior vena cava: The vessel was normal in size. The respirophasic diameter changes were blunted (<50%), consistent with elevated central venous pressure. - Pericardium, extracardiac: A trivial pericardial effusion was identified. There was a left pleural effusion.  Impressions:  - LVEF is mildly reduced when compared to prior.  Patient Profile     71 y.o. male w/ PMH of presumed nonischemic cardiomyopathy (EF 35-40% in 2003, improved to 50-55% by echo this admission), chronic atrial fibrillation, morbid obesity, HTN, and s/p recent fall with left knee injury who presented for lower extremity weakness and was found to have an acute CHF exacerbation.  Assessment & Plan    1. Acute on chronic diastolic heart failure  - echo this admission shows LVEF of 50-55% with grade 3 diastolic dysfunction. BNP 594 on admission with CXR showing cardiomegaly with CHF and probable small left effusion  with atelectasis. - IV lasix 80 mg BID yesterday; sCr 1.18 (1.01) with adequate urine output 2L yesterday and 1L so far today; weight has decreased 261 lb (282 lb) - transitioned lasix from IV to PO lasix 80 mg BID. - K is 4.1 (4.4), continue to check daily BMP   2. PVC's and brief bursts of NSVT  - no NSVT appreciated on telemetry review today - continue Toprol-Xl, HR in the 60-70s; patient asymptomatic - continue to monitor on telemetry.  3. Chronic atrial fibrillation - This patients CHA2DS2-VASc Score and unadjusted Ischemic Stroke Rate (% per year) is equal to 3.2 % stroke rate/year from a score of 3 (HTN, CHF, Age). On Coumadin. INR 2.43 (2.66)  -  continue Toprol-XL 12.5mg  daily for rate control - continue coumadin per pharmacy  4. Essential hypertension - BP 99/64 - 128/75 - continue current medication regimen.   5. Fall - secondary to reported bilateral leg weakness. No syncope reported.  - PT Eval completed on admission with recommendation of SNF at time of discharge. - per admitting team  Signed, Lynnae Sandhoff Duke PA-C 11/03/2016, 10:11 AM

## 2016-11-03 NOTE — Progress Notes (Addendum)
PROGRESS NOTE    [name]  WUJ:[id number] DOB: Mar 09, 1946 DOA: 10/29/2016 PCP: [facility]   Brief Narrative:  [name] is a 71 y.o. male with medical history significant of HTN, NICCM, Afib on Coumadin, morbid obesity; who presents after a fall.  Patient reported his legs being weak as the reason for him falling while trying to get out of bed. He notes falling on his buttocks with no trauma to his head or loss of consciousness. Thereafter, he reported complaints of right leg pain. Patient states that he is normally ambulatory with the help of a assistive device. Last fall was packed in December 2017. He previously reports having had physical therapy in the home, but states that they have done all that they can do for him at this time and stopped coming. Patient states that he been taking 80 mg of Lasix daily and denies any weight gain or cough. He reports that his weight should be around 280 pounds. Records show that the patient should be on 60 mg of Lasix daily. Associated symptoms that he has been  experiencing include increased sputum production, leg swelling, and generalized weakness over the last few weeks. Patient states that he previously was on medications for heart failure, but notes at some point in time that these were stopped but does not recall why. Patient is also enrolled in patient outreach and has frequent check enzymes. Per review of records patient's brother called and noted that the patient does not have adequate care at home and that a 34 year old father cannot keep taking care of him. Upon EMS arrival to the home he was noted to smell of urine and the home was noted to be in significant disarray.  Patient notes that he only drinks alcohol but has not drank in the last 1 week. Admitted for Acute Diastolic CHF Exacerbation. Patient agreeable to SNF if it is [facility] [employer] [facility]. IV Diuresis was transitioned to po Lasix today.  Likely D/C in Am.   Assessment & Plan:   Principal Problem:   CHF exacerbation (HCC) Active Problems:   Acute respiratory failure with hypoxia (HCC)   Chronic atrial fibrillation (HCC)   Physical deconditioning   Fall   Alcohol abuse   V-tach [facility])   Acute on chronic diastolic heart failure (HCC)   Hypertensive heart disease with heart failure (HCC)  Acute Decompensation of Chronic Grade 3 Diastolic CHF in the setting of Non-ischemic Cardiomyopathy -Continue with Telemtery and Heart Failure Protocol -ECHO on 5/201 showed EF of 55-60% -Repeat TTE repeated and showed EF of 50-55% and Grade 3 Diastolic Dysfunction -Strict I's and O's, Daily Weights and Fluid Restriction -BNP was 594.5 and CXR howing significant signs for CHF exacerbation -Cardiology changed IV Diuresis with 80 mg of IV Lasix BID to po 80 mg BID -> Cr and BUN up slightly -Cardiology Following and appreciate Recc's; Started low dose Toprol XL because of Ventricular Ectopy -Possible addition of low-dose ARB if BP allows at a later day -Sees Dr. Antoine Poche as an outpatient. Has follow up scheduled with Theodore Demark, PA-C on 11/17/16 at 11:00 am  Acute respiratory failure with Hypoxia, improved -Patient seen with O2 saturations as low as 88% on room air. Oxygen saturation improved with 2 L nasal cannula oxygen. -Continuous pulse oximetry with nasal cannula oxygen and keep O2 saturations greater than 92%  -Continue Incentive Spirometry  -Wean O2 as Tolerated; Will need Walk Screen prior to D/C -Was not wearing  O2 today  Nonsustained VTACH and PVCs -Had 12 beat Run on 10/30/16 -Cardiology Following and Added Toprol XL -C/w Remote Telemetry  Fall with Generalized Weakness and Deconditioning -Social work consult for SNF placement; Patient wants [facility] -PT Evaluated and Treated and recommend SNF  A. Fib on chronic anticoagulation -CHADS2VASc at least 3 -Patient rate controlled without medication and has a  therapeutic INR of 2.43 -Continue Coumadin per pharmacy; Will receive 2.5 mg dose tonight -Cardiology added Toprol XL -Appreciated Cardiology's Recc's  Macrocytic Anemia   - Initial hemoglobin 11 with elevated MCV and MCH.  - Hb/Hct today at 10.8/33.3 - B12 was 722 and Folate was 1328  Hyperlipidemia  - Continue Pravastatin 10 mg po Daily   Alcohol abuse history - EtOH Levels <5 - Neuro checks - C/w Folic Acid, MVI, and Thaimine; CIWA Protocol discontineued   History of Gout - Continue Allopurinol 100 mg po Daily  DVT prophylaxis: Anticoagulated with Coumadin; INR Therapeutic Code Status: DO NOT RESUSCITATE Family Communication: No family present at bedside Disposition Plan: SNF in AM  Consultants:   Cardiology  Procedures: ECHOCardiogram  Antimicrobials: None  Subjective: Seen and examined at bedside and feels better with improved leg ROM. Wanted to walk with Pt. No CP or SOB. Doing well.   Objective: Vitals:   11/02/16 1413 11/02/16 2059 11/03/16 0518 11/03/16 1458  BP: 128/75 (!) 107/58 99/64 118/68  Pulse: 68 63 (!) 55 63  Resp: 18 18 18 18   Temp: 97.9 F (36.6 C) 99.3 F (37.4 C) 98.7 F (37.1 C) 97.4 F (36.3 C)  TempSrc: Oral Oral Oral Oral  SpO2: 93% 93% 94% 96%  Weight:   118.4 kg (261 lb 0.4 oz)   Height:        Intake/Output Summary (Last 24 hours) at 11/03/16 1711 Last data filed at 11/03/16 1505  Gross per 24 hour  Intake              360 ml  Output             2401 ml  Net            -2041 ml   Filed Weights   11/01/16 0628 11/02/16 0625 11/03/16 0518  Weight: 129.3 kg (285 lb 0.9 oz) 128.2 kg (282 lb 10.1 oz) 118.4 kg (261 lb 0.4 oz)   Examination: Physical Exam:  Constitutional: WN/WD obese, NAD and appears calm and comfortable laying in bed Eyes: Lids and conjunctivae normal, sclerae anicteric  ENMT: External Ears, Nose appear normal. Grossly normal hearing.  Neck: Appears normal, supple, no cervical masses, normal ROM, no  appreciable thyromegaly, no appreaciable JVD Respiratory: Diminished to auscultation bilaterally, no wheezing, rales, rhonchi or crackles. Normal respiratory effort and patient is not tachypenic. No accessory muscle use.  Cardiovascular: Irregularly Irregular Rhythm and Rate, no murmurs / rubs / gallops. S1 and S2 auscultated. Mild extremity edema with venous stasis discoloration.  Abdomen: Soft, non-tender, non-distended. No masses palpated. No appreciable hepatosplenomegaly. Bowel sounds positive x4.  GU: Deferred. Condom Catheter in place draining clear yellow urine.  Musculoskeletal: No clubbing / cyanosis of digits/nails.  Skin: No rashes, lesions, ulcers on limited examination. No induration; Warm and dry.  Neurologic: CN 2-12 grossly intact with no focal deficits.  Romberg sign cerebellar reflexes not assessed.  Psychiatric: Normal judgment and insight. Alert and oriented x 3. Normal mood and appropriate affect.   Data Reviewed: I have personally reviewed following labs and imaging studies  CBC:  Recent Labs Lab  10/30/16 0409 10/30/16 1610 10/31/16 0545 11/01/16 0550 11/02/16 0529 11/03/16 0549  WBC 5.0  --  5.4 4.7 4.8 3.7*  NEUTROABS 3.6  --  4.0 3.3 3.3 2.3  HGB 11.4*  --  10.3* 10.1* 10.3* 10.8*  HCT 35.5* 34.3* 32.3* 32.1* 32.6* 33.3*  MCV 107.3*  --  107.7* 107.4* 106.5* 104.4*  PLT 186  --  186 170 180 178   Basic Metabolic Panel:  Recent Labs Lab 10/30/16 0409 10/31/16 0545 11/01/16 0550 11/02/16 0529 11/03/16 0549  NA 144 146* 141 143 142  K 4.4 4.4 4.1 4.4 4.1  CL 111 110 104 105 102  CO2 22 29 30 31 31   GLUCOSE 78 89 89 95 98  BUN 25* 25* 26* 30* 38*  CREATININE 1.04 1.02 1.12 1.01 1.18  CALCIUM 8.7* 8.7* 8.4* 8.7* 8.8*  MG  --  1.8 1.7 1.7 1.8  PHOS  --  3.5 3.3 3.3 3.4   GFR: Estimated Creatinine Clearance: 79.6 mL/min (by C-G formula based on SCr of 1.18 mg/dL). Liver Function Tests:  Recent Labs Lab 10/30/16 0409 10/31/16 0545  11/01/16 0550 11/02/16 0529 11/03/16 0549  AST 27 22 22 25 25   ALT 12* 11* 10* 9* 10*  ALKPHOS 141* 125 118 119 117  BILITOT 1.3* 1.5* 1.3* 1.4* 1.3*  PROT 7.4 6.6 6.2* 6.6 6.5  ALBUMIN 3.2* 3.0* 2.8* 2.9* 2.9*   No results for input(s): LIPASE, AMYLASE in the last 168 hours. No results for input(s): AMMONIA in the last 168 hours. Coagulation Profile:  Recent Labs Lab 10/30/16 0409 10/31/16 0545 11/01/16 0550 11/02/16 0529 11/03/16 0549  INR 2.33 2.99 2.85 2.66 2.43   Cardiac Enzymes:  Recent Labs Lab 10/30/16 0409 10/30/16 0949 10/30/16 1603  TROPONINI <0.03 <0.03 <0.03   BNP (last 3 results) No results for input(s): PROBNP in the last 8760 hours. HbA1C: No results for input(s): HGBA1C in the last 72 hours. CBG: No results for input(s): GLUCAP in the last 168 hours. Lipid Profile: No results for input(s): CHOL, HDL, LDLCALC, TRIG, CHOLHDL, LDLDIRECT in the last 72 hours. Thyroid Function Tests: No results for input(s): TSH, T4TOTAL, FREET4, T3FREE, THYROIDAB in the last 72 hours. Anemia Panel: No results for input(s): VITAMINB12, FOLATE, FERRITIN, TIBC, IRON, RETICCTPCT in the last 72 hours. Sepsis Labs:  Recent Labs Lab 10/29/16 2342  LATICACIDVEN 1.24    Recent Results (from the past 240 hour(s))  MRSA PCR Screening     Status: Abnormal   Collection Time: 10/30/16  3:04 AM  Result Value Ref Range Status   MRSA by PCR POSITIVE (A) NEGATIVE Final    Comment:        The GeneXpert MRSA Assay (FDA approved for NASAL specimens only), is one component of a comprehensive MRSA colonization surveillance program. It is not intended to diagnose MRSA infection nor to guide or monitor treatment for MRSA infections. RESULT CALLED TO, READ BACK BY AND VERIFIED WITH: SEAY,K @ 1030 ON [id number] BY POTEAT,S     Radiology Studies: No results found. Scheduled Meds: . allopurinol  100 mg Oral Daily  . chlorhexidine  15 mL Mouth Rinse BID  . Chlorhexidine  Gluconate Cloth  6 each Topical Q0600  . feeding supplement (ENSURE ENLIVE)  237 mL Oral BID BM  . folic acid  1 mg Oral Daily  . furosemide  80 mg Oral BID  . guaiFENesin  600 mg Oral BID  . metoprolol succinate  12.5 mg Oral Daily  . multivitamin with  minerals  1 tablet Oral Daily  . mupirocin ointment  1 application Nasal BID  . potassium chloride SA  20 mEq Oral Daily  . pravastatin  10 mg Oral Daily  . sodium chloride flush  3 mL Intravenous Q12H  . thiamine  100 mg Oral Daily  . warfarin  2.5 mg Oral ONCE-1800  . Warfarin - Pharmacist Dosing Inpatient   Does not apply q1800   Continuous Infusions:   LOS: 4 days   Merlene Laughter, DO [facility] Pager [phone]  If 7PM-7AM, please contact night-coverage [email or web address] Password [email or web address] 11/03/2016, 5:11 PM

## 2016-11-03 NOTE — Progress Notes (Signed)
ANTICOAGULATION CONSULT NOTE - Follow Up Consult  Pharmacy Consult for warfarin Indication: hx  atrial fibrillation  No Known Allergies  Patient Measurements: Height: 6\' 1"  (185.4 cm) Weight: 261 lb 0.4 oz (118.4 kg) IBW/kg (Calculated) : 79.9 Heparin Dosing Weight:  Vital Signs: Temp: 98.7 F (37.1 C) (01/23 0518) Temp Source: Oral (01/23 0518) BP: 99/64 (01/23 0518) Pulse Rate: 55 (01/23 0518)  Labs:  Recent Labs  11/01/16 0550 11/02/16 0529 11/03/16 0549  HGB 10.1* 10.3* 10.8*  HCT 32.1* 32.6* 33.3*  PLT 170 180 178  LABPROT 30.5* 28.9* 26.9*  INR 2.85 2.66 2.43  CREATININE 1.12 1.01 1.18    Estimated Creatinine Clearance: 79.6 mL/min (by C-G formula based on SCr of 1.18 mg/dL).   Medications:  Home warfarin regimen: 5 mg daily except 2.5 mg on MWF (last [facility] clinic note in Dec said dose adjusted to 5 mg daily except 2.5 mg on MF d/t low INR, but pt never changed to this new dose -- he continued taking 5 mg daily except 2.5 mg MWF).  INR was therapeutic on admission at 2.40.  Assessment: Patient's a 71 y.o M with hx of afib on warfarin PTA, presented to the ED on 10/30/16 s/p fall.  Warfarin resumed on admission.  Today, 11/03/2016: - INR continues to be therapeutic this AM - has received inpatient doses of 7.5mg , 0mg , 2.5mg  and  2.5mg  - CBC stable - no bleeding documented - no significant drug-drug intxns - heart healthy diet   Goal of Therapy:  INR 2-3 Monitor platelets by anticoagulation protocol: Yes   Plan:  1) Repeat warfarin 2.5mg  again this evening 2) Daily INR   Hessie Knows, PharmD, BCPS Pager [phone] 11/03/2016 10:24 AM

## 2016-11-03 NOTE — Progress Notes (Signed)
Physical Therapy Treatment Patient Details Name: [name] MRN: [id number] DOB: 1946-07-31 Today's Date: 11/03/2016    History of Present Illness 71 yo male admitted with CHF exacerbation. Hx of HTN, A fib, morbid obesity, CHF, alcoholism, cardiomyopathy, gout    PT Comments    Progressing with mobility. Continue to recommend SNF to maximize independence and safety with functional mobility.    Follow Up Recommendations  SNF     Equipment Recommendations  None recommended by PT    Recommendations for Other Services       Precautions / Restrictions Precautions Precautions: Fall Restrictions Weight Bearing Restrictions: No    Mobility  Bed Mobility Overal bed mobility: Needs Assistance Bed Mobility: Supine to Sit;Sit to Supine     Supine to sit: Min guard;HOB elevated Sit to supine: Min guard;HOB elevated   General bed mobility comments: Increased time. Pt used bedrail.   Transfers Overall transfer level: Needs assistance Equipment used: Rolling walker (2 wheeled) Transfers: Sit to/from Stand Sit to Stand: From elevated surface;Min assist         General transfer comment: Assist to rise from elevated. VCs safety, technique, hand/LE placement. Increased time.   Ambulation/Gait Ambulation/Gait assistance: Min assist Ambulation Distance (Feet): 45 Feet Assistive device: Rolling walker (2 wheeled) Gait Pattern/deviations: Step-through pattern;Decreased stride length     General Gait Details: cues for safe use of walker. Assist to stabilize.    Stairs            Wheelchair Mobility    Modified Rankin (Stroke Patients Only)       Balance                                    Cognition Arousal/Alertness: Awake/alert Behavior During Therapy: WFL for tasks assessed/performed Overall Cognitive Status: Within Functional Limits for tasks assessed                      Exercises      General Comments        Pertinent  Vitals/Pain Pain Assessment: No/denies pain    Home Living                      Prior Function            PT Goals (current goals can now be found in the care plan section) Progress towards PT goals: Progressing toward goals    Frequency    Min 3X/week      PT Plan Current plan remains appropriate    Co-evaluation             End of Session Equipment Utilized During Treatment: Gait belt Activity Tolerance: Patient tolerated treatment well Patient left: in bed;with call bell/phone within reach;with bed alarm set     Time: 1203-1226 PT Time Calculation (min) (ACUTE ONLY): 23 min  Charges:  $Gait Training: 8-22 mins                    G Codes:      Rebeca Alert, MPT Pager: [phone]

## 2016-11-04 DIAGNOSIS — R5381 Other malaise: Secondary | ICD-10-CM

## 2016-11-04 DIAGNOSIS — I5023 Acute on chronic systolic (congestive) heart failure: Secondary | ICD-10-CM

## 2016-11-04 DIAGNOSIS — Z7901 Long term (current) use of anticoagulants: Secondary | ICD-10-CM

## 2016-11-04 DIAGNOSIS — F1010 Alcohol abuse, uncomplicated: Secondary | ICD-10-CM

## 2016-11-04 LAB — COMPREHENSIVE METABOLIC PANEL
ALK PHOS: 109 U/L (ref 38–126)
ALT: 11 U/L — ABNORMAL LOW (ref 17–63)
ANION GAP: 9 (ref 5–15)
AST: 25 U/L (ref 15–41)
Albumin: 2.8 g/dL — ABNORMAL LOW (ref 3.5–5.0)
BILIRUBIN TOTAL: 1.4 mg/dL — AB (ref 0.3–1.2)
BUN: 41 mg/dL — AB (ref 6–20)
CALCIUM: 8.6 mg/dL — AB (ref 8.9–10.3)
CO2: 31 mmol/L (ref 22–32)
Chloride: 100 mmol/L — ABNORMAL LOW (ref 101–111)
Creatinine, Ser: 1.13 mg/dL (ref 0.61–1.24)
GFR calc Af Amer: >60 mL/min (ref >60)
Glucose, Bld: 90 mg/dL (ref 65–99)
POTASSIUM: 4 mmol/L (ref 3.5–5.1)
Sodium: 140 mmol/L (ref 135–145)
TOTAL PROTEIN: 6.3 g/dL — AB (ref 6.5–8.1)

## 2016-11-04 LAB — CBC WITH DIFFERENTIAL/PLATELET
Basophils Absolute: 0 10*3/uL (ref 0.0–0.1)
Basophils Relative: 0 %
Eosinophils Absolute: 0.2 10*3/uL (ref 0.0–0.7)
Eosinophils Relative: 4 %
HEMATOCRIT: 32.3 % — AB (ref 39.0–52.0)
Hemoglobin: 10.6 g/dL — ABNORMAL LOW (ref 13.0–17.0)
LYMPHS PCT: 25 %
Lymphs Abs: 1 10*3/uL (ref 0.7–4.0)
MCH: 34.2 pg — ABNORMAL HIGH (ref 26.0–34.0)
MCHC: 32.8 g/dL (ref 30.0–36.0)
MCV: 104.2 fL — AB (ref 78.0–100.0)
MONO ABS: 0.3 10*3/uL (ref 0.1–1.0)
MONOS PCT: 8 %
NEUTROS ABS: 2.5 10*3/uL (ref 1.7–7.7)
Neutrophils Relative %: 63 %
Platelets: 171 10*3/uL (ref 150–400)
RBC: 3.1 MIL/uL — ABNORMAL LOW (ref 4.22–5.81)
RDW: 15.3 % (ref 11.5–15.5)
WBC: 4 10*3/uL (ref 4.0–10.5)

## 2016-11-04 LAB — PHOSPHORUS: Phosphorus: 3.3 mg/dL (ref 2.5–4.6)

## 2016-11-04 LAB — PROTIME-INR
INR: 2.09
Prothrombin Time: 23.8 seconds — ABNORMAL HIGH (ref 11.4–15.2)

## 2016-11-04 LAB — MAGNESIUM: MAGNESIUM: 1.7 mg/dL (ref 1.7–2.4)

## 2016-11-04 MED ORDER — METOPROLOL SUCCINATE ER 25 MG PO TB24: 12.5000 mg | ORAL_TABLET | Freq: Every day | ORAL | 0 refills | Status: DC

## 2016-11-04 MED ORDER — WARFARIN SODIUM 5 MG PO TABS: 5.0000 mg | ORAL_TABLET | Freq: Once | ORAL | Status: DC

## 2016-11-04 MED ORDER — GUAIFENESIN ER 600 MG PO TB12: 600.0000 mg | ORAL_TABLET | Freq: Two times a day (BID) | ORAL | Status: DC

## 2016-11-04 MED ORDER — IPRATROPIUM-ALBUTEROL 0.5-2.5 (3) MG/3ML IN SOLN: 3.0000 mL | RESPIRATORY_TRACT | Status: DC | PRN

## 2016-11-04 MED ORDER — THIAMINE HCL 100 MG PO TABS: 100.0000 mg | ORAL_TABLET | Freq: Every day | ORAL | 0 refills | Status: DC

## 2016-11-04 MED ORDER — FOLIC ACID 1 MG PO TABS: 1.0000 mg | ORAL_TABLET | Freq: Every day | ORAL | Status: DC

## 2016-11-04 MED ORDER — FUROSEMIDE 80 MG PO TABS: 80.0000 mg | ORAL_TABLET | Freq: Two times a day (BID) | ORAL | 0 refills | Status: DC

## 2016-11-04 NOTE — Progress Notes (Signed)
ANTICOAGULATION CONSULT NOTE - Follow Up Consult  Pharmacy Consult for warfarin Indication: hx  atrial fibrillation  No Known Allergies  Patient Measurements: Height: 6\' 1"  (185.4 cm) Weight: 261 lb (118.4 kg) IBW/kg (Calculated) : 79.9 Heparin Dosing Weight:  Vital Signs: Temp: 97.3 F (36.3 C) (01/24 0508) Temp Source: Oral (01/24 0508) BP: 112/65 (01/24 0508) Pulse Rate: 65 (01/24 0508)  Labs:  Recent Labs  11/02/16 0529 11/03/16 0549 11/04/16 0521  HGB 10.3* 10.8* 10.6*  HCT 32.6* 33.3* 32.3*  PLT 180 178 171  LABPROT 28.9* 26.9* 23.8*  INR 2.66 2.43 2.09  CREATININE 1.01 1.18 1.13    Estimated Creatinine Clearance: 83.2 mL/min (by C-G formula based on SCr of 1.13 mg/dL).   Medications:  Home warfarin regimen: 5 mg daily except 2.5 mg on MWF (last [facility] clinic note in Dec said dose adjusted to 5 mg daily except 2.5 mg on MF d/t low INR, but pt never changed to this new dose -- he continued taking 5 mg daily except 2.5 mg MWF).  INR was therapeutic on admission at 2.40.  Assessment: Patient's a 71 y.o M with hx of afib on warfarin PTA, presented to the ED on 10/30/16 s/p fall.  Warfarin resumed on admission.  Today, 11/04/2016: - INR continues to be therapeutic this AM but is dropping some now - CBC stable - no bleeding documented - no significant drug-drug intxns - heart healthy diet   Goal of Therapy:  INR 2-3 Monitor platelets by anticoagulation protocol: Yes   Plan:  1) Warfarin 5mg  again this evening 2) Daily INR   Hessie Knows, PharmD, BCPS Pager [phone] 11/04/2016 10:28 AM

## 2016-11-04 NOTE — Clinical Social Work Placement (Signed)
Patient is set to discharge to [facility] today. Patient & brother, Gene made aware. Discharge packet given to RN, Cicero Duck. [facility] called for transport.     Lincoln Maxin, LCSW [facility] Clinical Social Worker cell #: [phone]    CLINICAL SOCIAL WORK PLACEMENT  NOTE  Date:  11/04/2016  Patient Details  Name: [name] MRN: [id number] Date of Birth: Feb 21, 1946  Clinical Social Work is seeking post-discharge placement for this patient at the Skilled  Nursing Facility level of care (*CSW will initial, date and re-position this form in  chart as items are completed):  Yes   Patient/family provided with [facility] Clinical Social Work Department's list of facilities offering this level of care within the geographic area requested by the patient (or if unable, by the patient's family).  Yes   Patient/family informed of their freedom to choose among providers that offer the needed level of care, that participate in Medicare, Medicaid or managed care program needed by the patient, have an available bed and are willing to accept the patient.  Yes   Patient/family informed of [facility]'s ownership interest in [facility] and [facility], as well as of the fact that they are under no obligation to receive care at these facilities.  PASRR submitted to EDS on       PASRR number received on       Existing PASRR number confirmed on 10/30/16     FL2 transmitted to all facilities in geographic area requested by pt/family on 10/30/16     FL2 transmitted to all facilities within larger geographic area on       Patient informed that his/her managed care company has contracts with or will negotiate with certain facilities, including the following:        Yes   Patient/family informed of bed offers received.  Patient chooses bed at [facility]     Physician recommends and patient chooses bed at      Patient to be transferred to  [facility] on 11/04/16.  Patient to be transferred to facility by [facility]     Patient family notified on 11/04/16 of transfer.  Name of family member notified:  patient's brother, Gene via phone     PHYSICIAN       Additional Comment:    _______________________________________________ Arlyss Repress, LCSW 11/04/2016, 2:36 PM

## 2016-11-04 NOTE — Progress Notes (Signed)
Report called to Jose Persia at [facility]. Pt will be transported via [facility].

## 2016-11-04 NOTE — Care Management Important Message (Signed)
Important Message  Patient Details IM Letter given to Kathy/Case Manager to present to Patient Name: [name] MRN: [id number] Date of Birth: [birth year]   Medicare Important Message Given:  Yes    Caren Macadam 11/04/2016, 9:20 AM

## 2016-11-04 NOTE — Care Management Note (Signed)
Case Management Note  Patient Details  Name: [name] MRN: [id number] Date of Birth: 06-28-1946  Subjective/Objective:                    Action/Plan:d/c SNF   Expected Discharge Date:                  Expected Discharge Plan:  Skilled Nursing Facility  In-House Referral:  Clinical Social Work  Discharge planning Services  CM Consult  Post Acute Care Choice:    Choice offered to:     DME Arranged:    DME Agency:     HH Arranged:    HH Agency:     Status of Service:  Completed, signed off  If discussed at [employer], dates discussed:    Additional Comments:  Lanier Clam, RN 11/04/2016, 10:41 AM

## 2016-11-04 NOTE — Progress Notes (Signed)
Progress Note  Patient Name: [name] Date of Encounter: 11/04/2016  Primary Cardiologist: Dr. Antoine Poche  Subjective   Patient states he feels great and LE swelling is much improved. He is ready to return to rehab at [facility].  Inpatient Medications    Scheduled Meds:  allopurinol  100 mg Oral Daily   chlorhexidine  15 mL Mouth Rinse BID   Chlorhexidine Gluconate Cloth  6 each Topical Q0600   feeding supplement (ENSURE ENLIVE)  237 mL Oral BID BM   folic acid  1 mg Oral Daily   furosemide  80 mg Oral BID   guaiFENesin  600 mg Oral BID   metoprolol succinate  12.5 mg Oral Daily   multivitamin with minerals  1 tablet Oral Daily   mupirocin ointment  1 application Nasal BID   potassium chloride SA  20 mEq Oral Daily   pravastatin  10 mg Oral Daily   sodium chloride flush  3 mL Intravenous Q12H   thiamine  100 mg Oral Daily   Warfarin - Pharmacist Dosing Inpatient   Does not apply q1800   Continuous Infusions:  PRN Meds: sodium chloride, acetaminophen, ipratropium-albuterol, ondansetron (ZOFRAN) IV, sodium chloride flush   Vital Signs    Vitals:   11/02/16 2059 11/03/16 0518 11/03/16 1458 11/04/16 0508  BP: (!) 107/58 99/64 118/68 112/65  Pulse: 63 (!) 55 63 65  Resp: 18 18 18 18   Temp: 99.3 F (37.4 C) 98.7 F (37.1 C) 97.4 F (36.3 C) 97.3 F (36.3 C)  TempSrc: Oral Oral Oral Oral  SpO2: 93% 94% 96% 98%  Weight:  261 lb 0.4 oz (118.4 kg)  261 lb (118.4 kg)  Height:        Intake/Output Summary (Last 24 hours) at 11/04/16 0931 Last data filed at 11/04/16 0900  Gross per 24 hour  Intake              480 ml  Output             1251 ml  Net             -771 ml   Filed Weights   11/02/16 0625 11/03/16 0518 11/04/16 0508  Weight: 282 lb 10.1 oz (128.2 kg) 261 lb 0.4 oz (118.4 kg) 261 lb (118.4 kg)    Telemetry    Rate controlled Afib in the 70s - Personally Reviewed  ECG    No new tracings - Personally Reviewed  Physical Exam    General: Well developed, well nourished, male appearing in no acute distress. Head: Normocephalic, atraumatic.  Neck: Supple without bruits, JVD present Lungs:  Resp regular and unlabored, CTA. Heart: Irregular rhythm, regular rate, S1, S2, no murmur; no rub. Abdomen: Soft, non-tender, non-distended with normoactive bowel sounds. No hepatomegaly. No rebound/guarding. No obvious abdominal masses. Extremities: No clubbing, cyanosis, 1+ - 2+ LE edema above compression socks that are in place, L > R Distal pedal pulses are diminished. Neuro: Alert and oriented X 3. Moves all extremities spontaneously. Psych: Normal affect.  Labs    Chemistry Recent Labs Lab 11/02/16 0529 11/03/16 0549 11/04/16 0521  NA 143 142 140  K 4.4 4.1 4.0  CL 105 102 100*  CO2 31 31 31   GLUCOSE 95 98 90  BUN 30* 38* 41*  CREATININE 1.01 1.18 1.13  CALCIUM 8.7* 8.8* 8.6*  PROT 6.6 6.5 6.3*  ALBUMIN 2.9* 2.9* 2.8*  AST 25 25 25   ALT 9* 10* 11*  ALKPHOS 119 117  109  BILITOT 1.4* 1.3* 1.4*  GFRNONAA >60 >60 >60  GFRAA >60 >60 >60  ANIONGAP 7 9 9      Hematology Recent Labs Lab 11/02/16 0529 11/03/16 0549 11/04/16 0521  WBC 4.8 3.7* 4.0  RBC 3.06* 3.19* 3.10*  HGB 10.3* 10.8* 10.6*  HCT 32.6* 33.3* 32.3*  MCV 106.5* 104.4* 104.2*  MCH 33.7 33.9 34.2*  MCHC 31.6 32.4 32.8  RDW 15.5 15.2 15.3  PLT 180 178 171    Cardiac Enzymes Recent Labs Lab 10/30/16 0409 10/30/16 0949 10/30/16 1603  TROPONINI <0.03 <0.03 <0.03    Recent Labs Lab 10/29/16 2340  TROPIPOC 0.02     BNP Recent Labs Lab 10/29/16 2336  BNP 594.5*     DDimer No results for input(s): DDIMER in the last 168 hours.   Radiology    No results found.  Cardiac Studies   Echocardiogram: 10/30/2016 Study Conclusions  - Left ventricle: The cavity size was normal. There was mild concentric hypertrophy. Systolic function was normal. The estimated ejection fraction was in the range of 50% to 55%. Wall motion was  normal; there were no regional wall motion abnormalities. Doppler parameters are consistent with a reversible restrictive pattern, indicative of decreased left ventricular diastolic compliance and/or increased left atrial pressure (grade 3 diastolic dysfunction). - Ventricular septum: The contour showed diastolic flattening. - Aortic valve: Trileaflet; moderately thickened, moderately calcified leaflets. - Aorta: The aorta was mildly calcified. - Mitral valve: Calcified annulus. There was moderate regurgitation directed posteriorly. - Left atrium: The atrium was severely dilated. - Right ventricle: The cavity size was moderately dilated. Wall thickness was normal. Systolic function was severely reduced. - Tricuspid valve: There was moderate regurgitation directed eccentrically. - Pulmonary arteries: Systolic pressure was moderately increased. PA peak pressure: 47 mm Hg (S). - Inferior vena cava: The vessel was normal in size. The respirophasic diameter changes were blunted (<50%), consistent with elevated central venous pressure. - Pericardium, extracardiac: A trivial pericardial effusion was identified. There was a left pleural effusion.  Impressions:  - LVEF is mildly reduced when compared to prior.   Patient Profile     71 y.o. male w/ PMH ofpresumed nonischemic cardiomyopathy (EF 35-40% in 2003, improved to 50-55% by echo this admission), chronic atrial fibrillation, morbid obesity, HTN, and s/precent fall with left knee injury who presented for lower extremity weakness and was found to have an acute CHF exacerbation.  Assessment & Plan    1. Acute on chronic diastolic heart failure - echo this admissionshows LVEF of 50-55% with grade 3 diastolic dysfunction. BNP 594 on admission with CXR showing cardiomegaly with CHF and probable small left effusion with atelectasis. - PO lasix 80 mg BID; sCr stable  1.13 (1.18) with adequate urine output of 2.2L yesterday and he is  net negative 11L; weight appears stable at 261 lb (261 lbs); this is below a previously documented weight of 294 lb in 06/2015 in OP clinic when he appeared euvolemic. - pt continues to diurese well on PO lasix, recommend adjusting home dose at discharge - K is stable with current replacement regimen: 4.0 (4.1), continue to check daily BMP   2. PVC's and brief bursts of NSVT  - no NSVT appreciated on telemetry review today - continue Toprol-Xl, HR in the 60-70s; patient asymptomatic - continue to monitor on telemetry.   3. Chronic atrial fibrillation - This patients CHA2DS2-VASc Score and unadjusted Ischemic Stroke Rate (% per year) is equal to 3.2 % stroke rate/year from a score  of 3(HTN, CHF, Age). On Coumadin. INR 2.43 (2.66)  - continue Toprol-XL 12.5mg  daily for rate control - continue coumadin per pharmacy   4. Essential hypertension - BP 112/65 - 118/68 - continue current medication regimen.    5. Fall - secondary to reported bilateral leg weakness. No syncope reported.  - PT Eval completed on admission with recommendation of SNF at time of discharge. - per admitting team   Signed, Duane Boston , PA-C 9:31 AM 11/04/2016 Pager: [phone]

## 2016-11-04 NOTE — Discharge Summary (Addendum)
Physician Discharge Summary  [name] DHR:[id number] DOB: 01-31-1946 DOA: 10/29/2016  PCP: [facility]  Admit date: 10/29/2016 Discharge date: 11/04/2016  Admitted From: Home Disposition:  SNF  Recommendations for Outpatient Follow-up:  1. Follow up with PCP in 1-2 weeks 2. Follow up with Cardiology on 11/17/16 3. Please obtain BMP/CBC/ Mg in one week 4. Please discuss with CM at facility outpatient alcohol cessation groups to assist in staying sober 5. Participate in therapy per facility protocol 6. INR in 2 days 7. Daily weights  Home Health: No- going to SNF Equipment/Devices: None  Discharge Condition: Stable CODE STATUS: DNR Diet recommendation: Heart Healthy  Brief/Interim Summary: [name] is a 71 y.o. male with medical history significant of HTN, NICCM, Afib on Coumadin, morbid obesity; who presents after a fall.  Patient reported his legs being weak as the reason for him falling while trying to get out of bed. He notes falling on his buttocks with no trauma to his head or loss of consciousness. Thereafter, he reported complaints of right leg pain. Patient states that he is normally ambulatory with the help of a assistive device. Last fall was packed in December 2017. He previously reports having had physical therapy in the home, but states that they have done all that they can do for him at this time and stopped coming. Patient states that he been taking 80 mg of Lasix daily and denies any weight gain or cough. He reports that his weight should be around 280 pounds. Records show that the patient should be on 60 mg of Lasix daily. Associated symptoms that he has been  experiencing include increased sputum production, leg swelling, and generalized weakness over the last few weeks. Patient states that he previously was on medications for heart failure, but notes at some point in time that these were stopped but does not recall why. Patient is  also enrolled in patient outreach and has frequent check enzymes. Per review of records patient's brother called and noted that the patient does not have adequate care at home and that a 30 year old father cannot keep taking care of him. Upon EMS arrival to the home he was noted to smell of urine and the home was noted to be in significant disarray.  Patient notes that he only drinks alcohol but has not drank in the last 1 week.  ED Course: Upon admission to the emergency department patient was seen to be afebrile with respirations of 23, blood pressures maintained, and O2 saturations as low as 88% on room air. Lab work revealed hemoglobin 11, BNP 594.5, and troponin 0.02. Chest x-ray showed cardiomegaly with CHF and probable small left pleural effusion. X-ray imaging of the right leg show no acute fractures. Patient was given 20 mg of Lasix IV in the ED. [facility] called to admit for further sounds CHF exacerbation.  Discharge Diagnoses:  Principal Problem:   CHF exacerbation (HCC) Active Problems:   Acute respiratory failure with hypoxia (HCC)   Chronic atrial fibrillation (HCC)   Physical deconditioning   Fall   Alcohol abuse   V-tach [facility])   Acute on chronic diastolic heart failure (HCC)   Hypertensive heart disease with heart failure (HCC)  Acute Decompensation of Chronic Grade 3 Diastolic CHF in the setting of Non-ischemic Cardiomyopathy -Continue with Telemtery and Heart Failure Protocol -ECHO on 5/201 showed EF of 55-60% -Repeat TTE repeated and showed EF of 50-55% and Grade 3 Diastolic Dysfunction -Strict I's and O's, Daily Weights  and Fluid Restriction -BNP was 594.5 and CXR howing significant signs for CHF exacerbation -Cardiology changed IV Diuresis with 80 mg of IV Lasix BID to po 80 mg BID -> Cr and BUN up slightly -Cardiology Following and appreciate Recc's; Started low dose Toprol XL because of Ventricular Ectopy -Sees Dr. Antoine Poche as an outpatient. Has follow up scheduled with  Theodore Demark, PA-C on 11/17/16 at 11:00 am  Acute respiratory failure with Hypoxia, improved -Patient seen with O2 saturations as low as 88% on room air. Oxygen saturation improved with 2 L nasal cannula oxygen. -Continuous pulse oximetry with nasal cannula oxygen and keep O2 saturations greater than 92%  -Continue Incentive Spirometry  -Was not wearing O2 today  Nonsustained VTACH and PVCs -Had 12 beat Run on 10/30/16 -Cardiology Following and Added Toprol XL  Fall with Generalized Weakness and Deconditioning -Social work consult for SNF placement; Patient wants [facility] -PT Evaluated and Treated and recommend SNF  A. Fib on chronic anticoagulation -CHADS2VASc at least 3 -Patientrate controlled without medication and has a therapeutic INR of 2.43 - Restart home dose of coumadin -Cardiology added Toprol XL -Appreciated Cardiology's Recc's  Macrocytic Anemia  - Initial hemoglobin 11 with elevated MCV and MCH.  - Hb/Hct stable - B12 was 722 and Folate was 1328  Hyperlipidemia  - Continue Pravastatin 10 mg po Daily  Alcohol abuse history - EtOH Levels <5 - Neuro checks - C/w Folic Acid, MVI, and Thaimine; CIWA Protocol discontineued   History of Gout - Continue Allopurinol 100 mg po Daily  Discharge Instructions  Discharge Instructions    (HEART FAILURE PATIENTS) Call MD:  Anytime you have any of the following symptoms: 1) 3 pound weight gain in 24 hours or 5 pounds in 1 week 2) shortness of breath, with or without a dry hacking cough 3) swelling in the hands, feet or stomach 4) if you have to sleep on extra pillows at night in order to breathe.    Complete by:  As directed    Call MD for:  difficulty breathing, headache or visual disturbances    Complete by:  As directed    Call MD for:  extreme fatigue    Complete by:  As directed    Call MD for:  hives    Complete by:  As directed    Call MD for:  persistant dizziness or light-headedness    Complete by:   As directed    Call MD for:  persistant nausea and vomiting    Complete by:  As directed    Call MD for:  severe uncontrolled pain    Complete by:  As directed    Call MD for:  temperature >100.4    Complete by:  As directed    Diet - low sodium heart healthy    Complete by:  As directed    Increase activity slowly    Complete by:  As directed      Allergies as of 11/04/2016   No Known Allergies     Medication List    STOP taking these medications   polyethylene glycol packet Commonly known as:  MIRALAX / GLYCOLAX   QUEtiapine 25 MG tablet Commonly known as:  SEROQUEL   senna-docusate 8.6-50 MG tablet Commonly known as:  Senokot-S     TAKE these medications   acetaminophen 325 MG tablet Commonly known as:  TYLENOL Take 2 tablets (650 mg total) by mouth every 6 (six) hours as needed for mild pain, moderate pain,  fever or headache (or Fever >/= 101).   allopurinol 100 MG tablet Commonly known as:  ZYLOPRIM Take 100 mg by mouth daily.   feeding supplement (ENSURE ENLIVE) Liqd Take 237 mLs by mouth 2 (two) times daily between meals.   feeding supplement (PRO-STAT SUGAR FREE 64) Liqd Take 30 mLs by mouth 2 (two) times daily.   folic acid 1 MG tablet Commonly known as:  FOLVITE Take 1 tablet (1 mg total) by mouth daily. Start taking on:  11/05/2016   furosemide 80 MG tablet Commonly known as:  LASIX Take 1 tablet (80 mg total) by mouth 2 (two) times daily. What changed:  medication strength  how much to take  when to take this  Another medication with the same name was removed. Continue taking this medication, and follow the directions you see here.   guaiFENesin 600 MG 12 hr tablet Commonly known as:  MUCINEX Take 1 tablet (600 mg total) by mouth 2 (two) times daily.   ipratropium-albuterol 0.5-2.5 (3) MG/3ML Soln Commonly known as:  DUONEB Take 3 mLs by nebulization every 2 (two) hours as needed.   metoprolol succinate 25 MG 24 hr tablet Commonly  known as:  TOPROL-XL Take 0.5 tablets (12.5 mg total) by mouth daily. Start taking on:  11/05/2016   multivitamin with minerals Tabs tablet Take 1 tablet by mouth daily.   potassium chloride SA 20 MEQ tablet Commonly known as:  K-DUR,KLOR-CON Take 1 tablet (20 mEq total) by mouth daily.   pravastatin 10 MG tablet Commonly known as:  PRAVACHOL Take 10 mg by mouth daily.   thiamine 100 MG tablet Take 1 tablet (100 mg total) by mouth daily. Start taking on:  11/05/2016   warfarin 5 MG tablet Commonly known as:  COUMADIN Take 0.5 tablets (2.5 mg total) by mouth daily at 6 PM. What changed:  how much to take  when to take this  additional instructions      Follow-up Information    Barrett, [name], PA-C Follow up on 11/17/2016.   Specialties:  Cardiology, Radiology Why:  Cardiology Hospital Follow-Up on 11/17/2016 on 11:00AM.  Contact information: [address] [place] [address] [phone]          No Known Allergies  Consultations:  Cardiology  Therapy   Procedures/Studies: Dg Chest 2 View  Result Date: 10/30/2016 CLINICAL DATA:  Patient fell from bed.  Pain. EXAM: CHEST  2 VIEW COMPARISON:  03/11/2016 FINDINGS: Cardiomegaly with aortic atherosclerosis. Pulmonary vascular congestion consistent with CHF. Opacity at the left lung base consistent with left effusion and probable atelectasis. No suspicious osseous abnormality. IMPRESSION: Cardiomegaly with CHF and probable small left effusion with atelectasis. Superimposed pneumonia would be difficult to exclude at the left lung base. Electronically Signed   By: Tollie Eth M.D.   On: 10/30/2016 00:08   Dg Knee 2 Views Right  Result Date: 10/30/2016 CLINICAL DATA:  Right knee pain after fall EXAM: RIGHT KNEE - 1-2 VIEW COMPARISON:  08/27/2016 FINDINGS: No acute fracture. Femorotibial joint space narrowing with minimal spurring off the lateral femoral condyle. Spurring of the tibial spine. No joint  effusion, fracture nor bone destruction. IMPRESSION: Osteoarthritis of the right knee without acute osseous abnormality. Electronically Signed   By: Tollie Eth M.D.   On: 10/30/2016 00:10      Subjective: Patient states he feels very well this morning.  Asking if he can discharge after lunch today as he is very concerned about not eating at facility.  Breathing is improved.  No overnight events noted.  Discharge Exam: Vitals:   11/03/16 1458 11/04/16 0508  BP: 118/68 112/65  Pulse: 63 65  Resp: 18 18  Temp: 97.4 F (36.3 C) 97.3 F (36.3 C)   Vitals:   11/02/16 2059 11/03/16 0518 11/03/16 1458 11/04/16 0508  BP: (!) 107/58 99/64 118/68 112/65  Pulse: 63 (!) 55 63 65  Resp: 18 18 18 18   Temp: 99.3 F (37.4 C) 98.7 F (37.1 C) 97.4 F (36.3 C) 97.3 F (36.3 C)  TempSrc: Oral Oral Oral Oral  SpO2: 93% 94% 96% 98%  Weight:  118.4 kg (261 lb 0.4 oz)  118.4 kg (261 lb)  Height:        General: Pt is alert, awake, not in acute distress Cardiovascular: irregularly irregular rhythm, regular rate, S1/S2 +, no rubs, no gallops Respiratory: CTA bilaterally, no wheezing, no rhonchi, no increased work of breathing or accessory muscle use Abdominal: Soft, NT, ND, bowel sounds + Extremities: no edema, no cyanosis    The results of significant diagnostics from this hospitalization (including imaging, microbiology, ancillary and laboratory) are listed below for reference.     Microbiology: Recent Results (from the past 240 hour(s))  MRSA PCR Screening     Status: Abnormal   Collection Time: 10/30/16  3:04 AM  Result Value Ref Range Status   MRSA by PCR POSITIVE (A) NEGATIVE Final    Comment:        The GeneXpert MRSA Assay (FDA approved for NASAL specimens only), is one component of a comprehensive MRSA colonization surveillance program. It is not intended to diagnose MRSA infection nor to guide or monitor treatment for MRSA infections. RESULT CALLED TO, READ BACK BY AND  VERIFIED WITH: SEAY,K @ 1030 ON [id number] BY POTEAT,S      Labs: BNP (last 3 results)  Recent Labs  10/29/16 2336  BNP 594.5*   Basic Metabolic Panel:  Recent Labs Lab 10/31/16 0545 11/01/16 0550 11/02/16 0529 11/03/16 0549 11/04/16 0521  NA 146* 141 143 142 140  K 4.4 4.1 4.4 4.1 4.0  CL 110 104 105 102 100*  CO2 29 30 31 31 31   GLUCOSE 89 89 95 98 90  BUN 25* 26* 30* 38* 41*  CREATININE 1.02 1.12 1.01 1.18 1.13  CALCIUM 8.7* 8.4* 8.7* 8.8* 8.6*  MG 1.8 1.7 1.7 1.8 1.7  PHOS 3.5 3.3 3.3 3.4 3.3   Liver Function Tests:  Recent Labs Lab 10/31/16 0545 11/01/16 0550 11/02/16 0529 11/03/16 0549 11/04/16 0521  AST 22 22 25 25 25   ALT 11* 10* 9* 10* 11*  ALKPHOS 125 118 119 117 109  BILITOT 1.5* 1.3* 1.4* 1.3* 1.4*  PROT 6.6 6.2* 6.6 6.5 6.3*  ALBUMIN 3.0* 2.8* 2.9* 2.9* 2.8*   No results for input(s): LIPASE, AMYLASE in the last 168 hours. No results for input(s): AMMONIA in the last 168 hours. CBC:  Recent Labs Lab 10/31/16 0545 11/01/16 0550 11/02/16 0529 11/03/16 0549 11/04/16 0521  WBC 5.4 4.7 4.8 3.7* 4.0  NEUTROABS 4.0 3.3 3.3 2.3 2.5  HGB 10.3* 10.1* 10.3* 10.8* 10.6*  HCT 32.3* 32.1* 32.6* 33.3* 32.3*  MCV 107.7* 107.4* 106.5* 104.4* 104.2*  PLT 186 170 180 178 171   Cardiac Enzymes:  Recent Labs Lab 10/30/16 0409 10/30/16 0949 10/30/16 1603  TROPONINI <0.03 <0.03 <0.03   BNP: Invalid input(s): POCBNP CBG: No results for input(s): GLUCAP in the last 168 hours. D-Dimer No results for input(s): DDIMER in the  last 72 hours. Hgb A1c No results for input(s): HGBA1C in the last 72 hours. Lipid Profile No results for input(s): CHOL, HDL, LDLCALC, TRIG, CHOLHDL, LDLDIRECT in the last 72 hours. Thyroid function studies No results for input(s): TSH, T4TOTAL, T3FREE, THYROIDAB in the last 72 hours.  Invalid input(s): FREET3 Anemia work up No results for input(s): VITAMINB12, FOLATE, FERRITIN, TIBC, IRON, RETICCTPCT in the last 72  hours. Urinalysis    Component Value Date/Time   COLORURINE YELLOW 10/30/2016 0400   APPEARANCEUR CLEAR 10/30/2016 0400   LABSPEC 1.006 10/30/2016 0400   PHURINE 5.0 10/30/2016 0400   GLUCOSEU NEGATIVE 10/30/2016 0400   HGBUR LARGE (A) 10/30/2016 0400   BILIRUBINUR NEGATIVE 10/30/2016 0400   KETONESUR NEGATIVE 10/30/2016 0400   PROTEINUR NEGATIVE 10/30/2016 0400   UROBILINOGEN 1.0 02/06/2015 1212   NITRITE NEGATIVE 10/30/2016 0400   LEUKOCYTESUR LARGE (A) 10/30/2016 0400   Sepsis Labs Invalid input(s): PROCALCITONIN,  WBC,  LACTICIDVEN Microbiology Recent Results (from the past 240 hour(s))  MRSA PCR Screening     Status: Abnormal   Collection Time: 10/30/16  3:04 AM  Result Value Ref Range Status   MRSA by PCR POSITIVE (A) NEGATIVE Final    Comment:        The GeneXpert MRSA Assay (FDA approved for NASAL specimens only), is one component of a comprehensive MRSA colonization surveillance program. It is not intended to diagnose MRSA infection nor to guide or monitor treatment for MRSA infections. RESULT CALLED TO, READ BACK BY AND VERIFIED WITH: SEAY,K @ 1030 ON [id number] BY POTEAT,S      Time coordinating discharge: Over 30 minutes  SIGNED:   Katrinka Blazing, MD  [facility] 11/04/2016, 2:06 PM Pager [phone] If 7PM-7AM, please contact night-coverage [email or web address] Password [email or web address]

## 2016-11-17 ENCOUNTER — Encounter: Payer: Self-pay | Admitting: *Deleted

## 2016-11-17 ENCOUNTER — Ambulatory Visit: Payer: Medicare HMO | Admitting: Physician Assistant

## 2016-11-17 NOTE — Progress Notes (Deleted)
Cardiology Office Note   Date:  11/17/2016   ID:  [name], DOB [birth year], MRN [id number]  PCP:  [facility]  Cardiologist:  Dr. Antoine Poche 06/24/2015 Barrett, Richard Loser, PA-C   No chief complaint on file.   History of Present Illness: [name] is a 71 y.o. male with a history of  HTN, NICM EF (55-60% in 2017, previously 35-40% in 2003), diastolic dysfunction, chronic Afib on Coumadin, morbid obesity  Admit 1/18-1/24 for a fall with left knee injury, CHF, acute respiratory failure, NSVT w/ BB added, weight 261 pounds at discharge  [name] presents for ***   Past Medical History:  Diagnosis Date  . Alcoholism (HCC)   . Anemia   . Atrial fibrillation or flutter   . Cardiomyopathy    nonischemic  . CHF (congestive heart failure) (HCC)    ejection fractio of 45% per echo in 2011 (previos EF 35-40% in 2003), echo 04/2013  -LVEF 50-55%.  . Complication of anesthesia    April 11, 2013  . GERD (gastroesophageal reflux disease)    takes tums prn  . Hypertension   . Hypoxemia   . Obesity   . Ventricular tachycardia (HCC)    nonsustained, asymptomatic    Past Surgical History:  Procedure Laterality Date  . I&D EXTREMITY Right 06/28/2013   Procedure: IRRIGATION AND DEBRIDEMENT OF RIGHT LEG WITH SURGICAL PREP/PLACEMENT OF ACELL ;  Surgeon: Wayland Denis, DO;  Location: [facility] OR;  Service: Plastics;  Laterality: Right;  . KNEE BURSECTOMY Right 04/20/2013   Procedure: Right knee prepatella bursa decompression;  Surgeon: Cammy Copa, MD;  Location: [facility] OR;  Service: Orthopedics;  Laterality: Right;  . TONSILLECTOMY      Current Outpatient Prescriptions  Medication Sig Dispense Refill  . acetaminophen (TYLENOL) 325 MG tablet Take 2 tablets (650 mg total) by mouth every 6 (six) hours as needed for mild pain, moderate pain, fever or headache (or Fever >/= 101). (Patient not taking: Reported on 10/29/2016)    . allopurinol (ZYLOPRIM)  100 MG tablet Take 100 mg by mouth daily.     . Amino Acids-Protein Hydrolys (FEEDING SUPPLEMENT, PRO-STAT SUGAR FREE 64,) LIQD Take 30 mLs by mouth 2 (two) times daily. (Patient not taking: Reported on 08/27/2016)    . feeding supplement, ENSURE ENLIVE, (ENSURE ENLIVE) LIQD Take 237 mLs by mouth 2 (two) times daily between meals.    . folic acid (FOLVITE) 1 MG tablet Take 1 tablet (1 mg total) by mouth daily.    . furosemide (LASIX) 80 MG tablet Take 1 tablet (80 mg total) by mouth 2 (two) times daily. 30 tablet 0  . guaiFENesin (MUCINEX) 600 MG 12 hr tablet Take 1 tablet (600 mg total) by mouth 2 (two) times daily.    Marland Kitchen ipratropium-albuterol (DUONEB) 0.5-2.5 (3) MG/3ML SOLN Take 3 mLs by nebulization every 2 (two) hours as needed. 360 mL   . metoprolol succinate (TOPROL-XL) 25 MG 24 hr tablet Take 0.5 tablets (12.5 mg total) by mouth daily. 30 tablet 0  . Multiple Vitamin (MULTIVITAMIN WITH MINERALS) TABS tablet Take 1 tablet by mouth daily.    . potassium chloride SA (K-DUR,KLOR-CON) 20 MEQ tablet Take 1 tablet (20 mEq total) by mouth daily.    . pravastatin (PRAVACHOL) 10 MG tablet Take 10 mg by mouth daily.    Marland Kitchen thiamine 100 MG tablet Take 1 tablet (100 mg total) by mouth daily. 30 tablet 0  . warfarin (COUMADIN) 5  MG tablet Take 0.5 tablets (2.5 mg total) by mouth daily at 6 PM. (Patient taking differently: Take 2.5-5 mg by mouth as directed. Take 1 tablet (5 mg) on Sun, Tues, Thurs, Sat and Take 0.5 tablet (2.5 mg) on MWF)     No current facility-administered medications for this visit.     Allergies:   Patient has no known allergies.    Social History:  The patient  reports that he has quit smoking. His smoking use included Cigarettes. He started smoking about 29 years ago. He has never used smokeless tobacco. He reports that he drinks about 6.0 oz of alcohol per week . He reports that he does not use drugs.   Family History:  The patient's family history includes COPD in his mother;  Hypertension in his father.    ROS:  Please see the history of present illness. All other systems are reviewed and negative.    PHYSICAL EXAM: VS:  There were no vitals taken for this visit. , BMI There is no height or weight on file to calculate BMI. GEN: Well nourished, well developed, male in no acute distress  HEENT: normal for age  Neck: no JVD, no carotid bruit, no masses Cardiac: RRR; no murmur, no rubs, or gallops Respiratory:  clear to auscultation bilaterally, normal work of breathing GI: soft, nontender, nondistended, + BS MS: no deformity or atrophy; no edema; distal pulses are 2+ in all 4 extremities   Skin: warm and dry, no rash Neuro:  Strength and sensation are intact Psych: euthymic mood, full affect   EKG:  EKG {ACTION; IS/IS IPJ:[id number]} ordered today. The ekg ordered today demonstrates ***  Echocardiogram: 10/30/2016 Study Conclusions  - Left ventricle: The cavity size was normal. There was mild concentric hypertrophy. Systolic function was normal. The estimated ejection fraction was in the range of 50% to 55%. Wall motion was normal; there were no regional wall motion abnormalities. Doppler parameters are consistent with a reversible restrictive pattern, indicative of decreased left ventricular diastolic compliance and/or increased left atrial pressure (grade 3 diastolic dysfunction). - Ventricular septum: The contour showed diastolic flattening. - Aortic valve: Trileaflet; moderately thickened, moderately calcified leaflets. - Aorta: The aorta was mildly calcified. - Mitral valve: Calcified annulus. There was moderate regurgitation directed posteriorly. - Left atrium: The atrium was severely dilated. - Right ventricle: The cavity size was moderately dilated. Wall thickness was normal. Systolic function was severely reduced. - Tricuspid valve: There was moderate regurgitation directed eccentrically. - Pulmonary arteries:  Systolic pressure was moderately increased. PA peak pressure: 47 mm Hg (S). - Inferior vena cava: The vessel was normal in size. The respirophasic diameter changes were blunted (<50%), consistent with elevated central venous pressure. - Pericardium, extracardiac: A trivial pericardial effusion was identified. There was a left pleural effusion. Impressions: - LVEF is mildly reduced when compared to prior.  Recent Labs: 10/29/2016: B Natriuretic Peptide 594.5 10/30/2016: TSH 2.275 11/04/2016: ALT 11; BUN 41; Creatinine, Ser 1.13; Hemoglobin 10.6; Magnesium 1.7; Platelets 171; Potassium 4.0; Sodium 140    Lipid Panel    Component Value Date/Time   CHOL 79 02/07/2015 0508   TRIG 54 02/07/2015 0508   HDL 31 (L) 02/07/2015 0508   CHOLHDL 2.5 02/07/2015 0508   VLDL 11 02/07/2015 0508   LDLCALC 37 02/07/2015 0508     Wt Readings from Last 3 Encounters:  11/04/16 261 lb (118.4 kg)  08/27/16 280 lb (127 kg)  03/16/16 286 lb 9.6 oz (130 kg)  Other studies Reviewed: Additional studies/ records that were reviewed today include: ***.  ASSESSMENT AND PLAN:  1.  ***   Current medicines are reviewed at length with the patient today.  The patient {ACTIONS; HAS/DOES NOT HAVE:19233} concerns regarding medicines.  The following changes have been made:  {PLAN; NO CHANGE:13088:s}  Labs/ tests ordered today include: *** No orders of the defined types were placed in this encounter.    Disposition:   FU with ***  Signed, Theodore Demark, PA-C  11/17/2016 8:11 AM    [facility] Phone: [phone]; Fax: [phone]  This note was written with the assistance of speech recognition software. Please excuse any transcriptional errors.

## 2016-11-25 ENCOUNTER — Emergency Department (HOSPITAL_COMMUNITY)
Admission: EM | Admit: 2016-11-25 | Discharge: 2016-11-27 | Disposition: A | Discharge status: Home / Self Care | Payer: Medicare HMO | Attending: Emergency Medicine | Admitting: Emergency Medicine

## 2016-11-25 ENCOUNTER — Emergency Department (HOSPITAL_COMMUNITY): Payer: Medicare HMO

## 2016-11-25 ENCOUNTER — Encounter (HOSPITAL_COMMUNITY): Payer: Self-pay | Admitting: *Deleted

## 2016-11-25 DIAGNOSIS — W010XXA Fall on same level from slipping, tripping and stumbling without subsequent striking against object, initial encounter: Secondary | ICD-10-CM | POA: Diagnosis not present

## 2016-11-25 DIAGNOSIS — Y9301 Activity, walking, marching and hiking: Secondary | ICD-10-CM | POA: Insufficient documentation

## 2016-11-25 DIAGNOSIS — Z79899 Other long term (current) drug therapy: Secondary | ICD-10-CM | POA: Insufficient documentation

## 2016-11-25 DIAGNOSIS — Z87891 Personal history of nicotine dependence: Secondary | ICD-10-CM | POA: Insufficient documentation

## 2016-11-25 DIAGNOSIS — I5043 Acute on chronic combined systolic (congestive) and diastolic (congestive) heart failure: Secondary | ICD-10-CM | POA: Diagnosis not present

## 2016-11-25 DIAGNOSIS — S300XXA Contusion of lower back and pelvis, initial encounter: Secondary | ICD-10-CM | POA: Insufficient documentation

## 2016-11-25 DIAGNOSIS — W19XXXA Unspecified fall, initial encounter: Secondary | ICD-10-CM

## 2016-11-25 DIAGNOSIS — R930 Abnormal findings on diagnostic imaging of skull and head, not elsewhere classified: Secondary | ICD-10-CM | POA: Diagnosis not present

## 2016-11-25 DIAGNOSIS — I110 Hypertensive heart disease with heart failure: Secondary | ICD-10-CM | POA: Insufficient documentation

## 2016-11-25 DIAGNOSIS — S3992XA Unspecified injury of lower back, initial encounter: Secondary | ICD-10-CM | POA: Diagnosis present

## 2016-11-25 DIAGNOSIS — R52 Pain, unspecified: Secondary | ICD-10-CM

## 2016-11-25 DIAGNOSIS — Y929 Unspecified place or not applicable: Secondary | ICD-10-CM | POA: Insufficient documentation

## 2016-11-25 DIAGNOSIS — Z7901 Long term (current) use of anticoagulants: Secondary | ICD-10-CM | POA: Diagnosis not present

## 2016-11-25 DIAGNOSIS — Y999 Unspecified external cause status: Secondary | ICD-10-CM | POA: Diagnosis not present

## 2016-11-25 DIAGNOSIS — T148XXA Other injury of unspecified body region, initial encounter: Secondary | ICD-10-CM

## 2016-11-25 DIAGNOSIS — R102 Pelvic and perineal pain: Secondary | ICD-10-CM | POA: Insufficient documentation

## 2016-11-25 DIAGNOSIS — M25552 Pain in left hip: Secondary | ICD-10-CM

## 2016-11-25 LAB — CBC
HCT: 31 % — ABNORMAL LOW (ref 39.0–52.0)
HEMOGLOBIN: 9.7 g/dL — AB (ref 13.0–17.0)
MCH: 33.3 pg (ref 26.0–34.0)
MCHC: 31.3 g/dL (ref 30.0–36.0)
MCV: 106.5 fL — ABNORMAL HIGH (ref 78.0–100.0)
PLATELETS: 149 10*3/uL — AB (ref 150–400)
RBC: 2.91 MIL/uL — AB (ref 4.22–5.81)
RDW: 16.4 % — ABNORMAL HIGH (ref 11.5–15.5)
WBC: 8.8 10*3/uL (ref 4.0–10.5)

## 2016-11-25 LAB — CBC WITH DIFFERENTIAL/PLATELET
BASOS ABS: 0 10*3/uL (ref 0.0–0.1)
Basophils Relative: 0 %
EOS PCT: 0 %
Eosinophils Absolute: 0 10*3/uL (ref 0.0–0.7)
HEMATOCRIT: 34.5 % — AB (ref 39.0–52.0)
Hemoglobin: 11.2 g/dL — ABNORMAL LOW (ref 13.0–17.0)
LYMPHS ABS: 0.7 10*3/uL (ref 0.7–4.0)
LYMPHS PCT: 7 %
MCH: 33.9 pg (ref 26.0–34.0)
MCHC: 32.5 g/dL (ref 30.0–36.0)
MCV: 104.5 fL — AB (ref 78.0–100.0)
MONO ABS: 0.5 10*3/uL (ref 0.1–1.0)
Monocytes Relative: 5 %
NEUTROS ABS: 8.5 10*3/uL — AB (ref 1.7–7.7)
Neutrophils Relative %: 88 %
PLATELETS: 174 10*3/uL (ref 150–400)
RBC: 3.3 MIL/uL — AB (ref 4.22–5.81)
RDW: 15 % (ref 11.5–15.5)
WBC: 9.6 10*3/uL (ref 4.0–10.5)

## 2016-11-25 LAB — TYPE AND SCREEN
ABO/RH(D): A POS
ANTIBODY SCREEN: NEGATIVE

## 2016-11-25 LAB — BASIC METABOLIC PANEL
ANION GAP: 9 (ref 5–15)
BUN: 39 mg/dL — AB (ref 6–20)
CHLORIDE: 112 mmol/L — AB (ref 101–111)
CO2: 24 mmol/L (ref 22–32)
Calcium: 8.6 mg/dL — ABNORMAL LOW (ref 8.9–10.3)
Creatinine, Ser: 1.24 mg/dL (ref 0.61–1.24)
GFR calc Af Amer: >60 mL/min (ref >60)
GFR, EST NON AFRICAN AMERICAN: 58 mL/min — AB (ref >60)
Glucose, Bld: 77 mg/dL (ref 65–99)
POTASSIUM: 5.7 mmol/L — AB (ref 3.5–5.1)
Sodium: 145 mmol/L (ref 135–145)

## 2016-11-25 LAB — PROTIME-INR
INR: 3.11
Prothrombin Time: 32.7 seconds — ABNORMAL HIGH (ref 11.4–15.2)

## 2016-11-25 MED ORDER — FENTANYL CITRATE (PF) 100 MCG/2ML IJ SOLN
50.0000 ug | INTRAMUSCULAR | Status: DC | PRN
  Administered 2016-11-25 – 2016-11-26 (×3): 50 ug via INTRAVENOUS
  Filled 2016-11-25 (×3): qty 2

## 2016-11-25 NOTE — ED Triage Notes (Signed)
Per EMS, pt fell yesterday evening while going to bathroom. Pt slipped and fell on left side. Pt complains of left hip pain and has not been unable to ambulate since fall. Pt did not lose consciousness, denies head injury.    Pt left [facility] on Monday due to a fall.

## 2016-11-25 NOTE — ED Provider Notes (Signed)
[facility]-EMERGENCY DEPT Provider Note   CSN: [id number] Arrival date & time: 11/25/16  1358     History   Chief Complaint Chief Complaint  Patient presents with  . Fall  . Hip Pain    HPI [name] is a 71 y.o. male.  HPI Pt comes in with cc of fall. Pt reports that he was walking while pulling his pants up, and tripped and fell. Pt fell onto the L side and has severe L sided hip pain. Pt has no headaches or new neurologic complains. Pt is on coumadin for AF. Pt has hx of CHF, HTN and is morbidly obese.  Past Medical History:  Diagnosis Date  . Alcoholism (HCC)   . Anemia   . Atrial fibrillation or flutter   . Cardiomyopathy    nonischemic  . CHF (congestive heart failure) (HCC)    ejection fractio of 45% per echo in 2011 (previos EF 35-40% in 2003), echo 04/2013  -LVEF 50-55%.  . Complication of anesthesia    April 11, 2013  . GERD (gastroesophageal reflux disease)    takes tums prn  . Hypertension   . Hypoxemia   . Obesity   . Ventricular tachycardia (HCC)    nonsustained, asymptomatic    Patient Active Problem List   Diagnosis Date Noted  . Hypertensive heart disease with heart failure (HCC)   . Acute on chronic diastolic heart failure (HCC)   . V-tach (HCC) 10/31/2016  . CHF exacerbation (HCC) 10/30/2016  . CHF (congestive heart failure) (HCC) 10/30/2016  . Bradycardia 03/16/2016  . Alcohol abuse 03/16/2016  . Acute encephalopathy 03/16/2016  . Alcohol withdrawal delirium (HCC) 03/16/2016  . Cor, pulmonale, acute (HCC)   . CAP (community acquired pneumonia) 03/06/2016  . Weakness of both lower extremities 03/04/2016  . Morbid obesity - BMI 45 10/23/2015  . Current use of long term anticoagulation 10/23/2015  . 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 Diastolic dysfunction with acute on chronic heart failure (HCC) 10/23/2015  . 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 Recurrent falls 10/20/2015  . Physical deconditioning 10/20/2015  . Hyperkalemia  10/20/2015  . Fall   . Streptococcus viridans infection 02/13/2015  . Sepsis (HCC) 02/07/2015  . Anemia of chronic disease 02/07/2015  . Alcoholic hepatitis 02/07/2015  . Thrombocytopenia (HCC) 02/07/2015  . Elevated troponin 02/06/2015  . Hematoma 04/24/2013  . Chronic atrial fibrillation (HCC) 04/24/2013  . Acute respiratory failure with hypoxia (HCC) 04/23/2013  . Cellulitis 04/23/2013  . Acute on chronic systolic congestive heart failure (HCC) 04/22/2013  . Essential hypertension 12/06/2009    Past Surgical History:  Procedure Laterality Date  . I&D EXTREMITY Right 06/28/2013   Procedure: IRRIGATION AND DEBRIDEMENT OF RIGHT LEG WITH SURGICAL PREP/PLACEMENT OF ACELL ;  Surgeon: Wayland Denis, DO;  Location: [facility] OR;  Service: Plastics;  Laterality: Right;  . KNEE BURSECTOMY Right 04/20/2013   Procedure: Right knee prepatella bursa decompression;  Surgeon: Cammy Copa, MD;  Location: [facility] OR;  Service: Orthopedics;  Laterality: Right;  . TONSILLECTOMY         Home Medications    Prior to Admission medications   Medication Sig Start Date End Date Taking? Authorizing Provider  acetaminophen (TYLENOL) 325 MG tablet Take 2 tablets (650 mg total) by mouth every 6 (six) hours as needed for mild pain, moderate pain, fever or headache (or Fever >/= 101). 10/25/15  Yes Elease Etienne, MD  allopurinol (ZYLOPRIM) 100 MG tablet Take 100 mg by  mouth 2 (two) times daily.  01/14/16  Yes Historical Provider, MD  folic acid (FOLVITE) 1 MG tablet Take 1 tablet (1 mg total) by mouth daily. 11/05/16  Yes Filbert Schilder, MD  furosemide (LASIX) 80 MG tablet Take 1 tablet (80 mg total) by mouth 2 (two) times daily. 11/04/16  Yes Filbert Schilder, MD  metoprolol succinate (TOPROL-XL) 25 MG 24 hr tablet Take 0.5 tablets (12.5 mg total) by mouth daily. 11/05/16  Yes Filbert Schilder, MD  Multiple Vitamin (MULTIVITAMIN WITH MINERALS) TABS tablet Take 1 tablet by mouth daily.   Yes Historical  Provider, MD  potassium chloride SA (K-DUR,KLOR-CON) 20 MEQ tablet Take 1 tablet (20 mEq total) by mouth daily. Patient taking differently: Take 20 mEq by mouth 2 (two) times daily.  10/25/15  Yes Elease Etienne, MD  pravastatin (PRAVACHOL) 10 MG tablet Take 10 mg by mouth daily.   Yes Historical Provider, MD  thiamine 100 MG tablet Take 1 tablet (100 mg total) by mouth daily. 11/05/16  Yes Filbert Schilder, MD  warfarin (COUMADIN) 5 MG tablet Take 0.5 tablets (2.5 mg total) by mouth daily at 6 PM. Patient taking differently: Take 2.5-5 mg by mouth as directed. Take 1 tablet (5 mg) on Sun, Tues, Thurs, Sat and Take 0.5 tablet (2.5 mg) on MWF 10/26/15  Yes Elease Etienne, MD  Amino Acids-Protein Hydrolys (FEEDING SUPPLEMENT, PRO-STAT SUGAR FREE 64,) LIQD Take 30 mLs by mouth 2 (two) times daily. Patient not taking: Reported on 08/27/2016 10/25/15   Elease Etienne, MD  feeding supplement, ENSURE ENLIVE, (ENSURE ENLIVE) LIQD Take 237 mLs by mouth 2 (two) times daily between meals. 10/25/15   Elease Etienne, MD  guaiFENesin (MUCINEX) 600 MG 12 hr tablet Take 1 tablet (600 mg total) by mouth 2 (two) times daily. Patient not taking: Reported on 11/25/2016 11/04/16   Filbert Schilder, MD  ipratropium-albuterol (DUONEB) 0.5-2.5 (3) MG/3ML SOLN Take 3 mLs by nebulization every 2 (two) hours as needed. Patient not taking: Reported on 11/25/2016 11/04/16   Filbert Schilder, MD    Family History Family History  Problem Relation Age of Onset  . COPD Mother   . Hypertension Father     Social History Social History  Substance Use Topics  . Smoking status: Former Smoker    Types: Cigarettes    Start date: 06/29/1987  . Smokeless tobacco: Never Used     Comment: stopped in 1985  . Alcohol use 6.0 oz/week    10 Cans of beer per week     Comment: 03/04/16 - "I have not been drunk in 20 years. I can take it or leave it. "     Allergies   Patient has no known allergies.   Review of  Systems Review of Systems  ROS 10 Systems reviewed and are negative for acute change except as noted in the HPI.     Physical Exam Updated Vital Signs BP 115/66 (BP Location: Right Arm)   Pulse 94   Temp 98 F (36.7 C) (Oral)   Resp 19   Ht 6\' 1"  (1.854 m)   Wt 261 lb (118.4 kg)   SpO2 93%   BMI 34.43 kg/m   Physical Exam  Constitutional: He is oriented to person, place, and time. He appears well-developed.  HENT:  Head: Normocephalic and atraumatic.  Neck: Neck supple.  No midline c-spine tenderness, pt able to turn head to 45 degrees bilaterally without any pain and able  to flex neck to the chest and extend without any pain or neurologic symptoms.   Cardiovascular: Normal rate.   Pulmonary/Chest: Effort normal.  Abdominal: Soft.  Obese, large pannus, drainage under the pannus  Musculoskeletal:  Pt has L sided pelvic tenderness. Bilateral lower extremity shows pitting edema  Head to toe evaluation shows no hematoma, bleeding of the scalp, no facial abrasions, no spine step offs, crepitus of the chest or neck, no tenderness to palpation of the bilateral upper and lower extremities, no gross deformities, no chest tenderness.   Neurological: He is alert and oriented to person, place, and time.  Skin: Skin is warm.  Nursing note and vitals reviewed.    ED Treatments / Results  Labs (all labs ordered are listed, but only abnormal results are displayed) Labs Reviewed  BASIC METABOLIC PANEL - Abnormal; Notable for the following:       Result Value   Potassium 5.7 (*)    Chloride 112 (*)    BUN 39 (*)    Calcium 8.6 (*)    GFR calc non Af Amer 58 (*)    All other components within normal limits  CBC WITH DIFFERENTIAL/PLATELET - Abnormal; Notable for the following:    RBC 3.30 (*)    Hemoglobin 11.2 (*)    HCT 34.5 (*)    MCV 104.5 (*)    Neutro Abs 8.5 (*)    All other components within normal limits  PROTIME-INR - Abnormal; Notable for the following:     Prothrombin Time 32.7 (*)    All other components within normal limits  CBC - Abnormal; Notable for the following:    RBC 2.91 (*)    Hemoglobin 9.7 (*)    HCT 31.0 (*)    MCV 106.5 (*)    RDW 16.4 (*)    Platelets 149 (*)    All other components within normal limits  TYPE AND SCREEN    EKG  EKG Interpretation None       Radiology Dg Chest 1 View  Result Date: 11/25/2016 CLINICAL DATA:  Larey Seat.  Left hip pain. EXAM: CHEST 1 VIEW COMPARISON:  10/29/2016 FINDINGS: The heart is enlarged but stable. Central vascular congestion and chronic lung changes without overt pulmonary edema. The left lung base is poorly visualized. Could not exclude an effusion, infiltrate or atelectasis. The bony thorax is grossly intact. IMPRESSION: Poorly visualized left lung base could be a combination of effusion, atelectasis or infiltrate. Cardiac enlargement with vascular congestion but no overt pulmonary edema. Electronically Signed   By: Rudie Meyer M.D.   On: 11/25/2016 17:23   Ct Head Wo Contrast  Result Date: 11/25/2016 CLINICAL DATA:  71 year old male status post trip and fall onto left side with pain. Initial encounter. EXAM: CT HEAD WITHOUT CONTRAST TECHNIQUE: Contiguous axial images were obtained from the base of the skull through the vertex without intravenous contrast. COMPARISON:  Brain MRI 03/07/2016 FINDINGS: Brain: Cerebral volume is within normal limits for age. No midline shift, ventriculomegaly, mass effect, evidence of mass lesion, intracranial hemorrhage or evidence of cortically based acute infarction. Gray-white matter differentiation is within normal limits throughout the brain. No cortical encephalomalacia identified. Vascular: Calcified atherosclerosis at the skull base. No suspicious intracranial vascular hyperdensity. Skull: Congenital incomplete ossification of the posterior C1 ring. No skull fracture identified. No acute osseous abnormality identified. Sinuses/Orbits: Visualized  paranasal sinuses and mastoids are stable and well pneumatized. Other: No scalp hematoma identified.  Stable orbit soft tissues. IMPRESSION: Normal for age  non contrast appearance of the brain. No acute traumatic injury identified. Electronically Signed   By: Odessa Fleming M.D.   On: 11/25/2016 17:28   Ct Pelvis Wo Contrast  Result Date: 11/25/2016 CLINICAL DATA:  Larey Seat today while walking. Severe left-sided hip pain. Anti coagulated. EXAM: CT PELVIS WITHOUT CONTRAST TECHNIQUE: Multidetector CT imaging of the pelvis was performed following the standard protocol without intravenous contrast. COMPARISON:  Radiography same day.  CT 04/22/2013 FINDINGS: Old healed superior and inferior rami fractures on the left. No evidence of acute pelvic or hip fracture. Moderate osteoarthritis of both hips. The patient does have an intramuscular hematoma in the gluteus musculature on the left. No internal pelvic bleeding. IMPRESSION: Left gluteal muscle intramuscular hematoma. No evidence of acute pelvic fracture. Old healed superior and inferior rami fractures on the left. Electronically Signed   By: Paulina Fusi M.D.   On: 11/25/2016 18:33   Dg Hip Unilat With Pelvis 2-3 Views Left  Result Date: 11/25/2016 CLINICAL DATA:  Tripped and fell.  Left hip pain. EXAM: DG HIP (WITH OR WITHOUT PELVIS) 2-3V LEFT COMPARISON:  None. FINDINGS: Both hips are normally located. No acute hip fracture. The pubic symphysis and SI joints are grossly intact. No definite pelvic fractures. Moderate bilateral hip joint degenerative changes. IMPRESSION: No acute hip or pelvic fracture. Electronically Signed   By: Rudie Meyer M.D.   On: 11/25/2016 17:20    Procedures Procedures (including critical care time)  Medications Ordered in ED Medications  fentaNYL (SUBLIMAZE) injection 50 mcg (50 mcg Intravenous Given 11/25/16 2153)  furosemide (LASIX) tablet 80 mg (not administered)  metoprolol succinate (TOPROL-XL) 24 hr tablet 12.5 mg (not  administered)  thiamine tablet 100 mg (not administered)     Initial Impression / Assessment and Plan / ED Course  I have reviewed the triage vital signs and the nursing notes.  Pertinent labs & imaging results that were available during my care of the patient were reviewed by me and considered in my medical decision making (see chart for details).  Clinical Course as of Nov 26 25  Wed Nov 25, 2016  2000 Xrays are neg for acute fracture. Pt is not able to walk. CT pelvis ordered. He has persistent tenderness to palpation. Pt is on coumadin - so CT ordered. Hb is 11.2 DG Hip Unilat With Pelvis 2-3 Views Left [AN]  2230 Ct is + for hematoma. PT has no fracture. He is unable to walk much however, just was able to take 1 step. Pt doesn't feel comfortable going home. We called the family home phone -and no one picked up. We will keep pt here, and reassess in the morning, and get in touch with family.  SW consulted. CT Pelvis Wo Contrast [AN]    Clinical Course User Index [AN] Derwood Kaplan, MD    DDx includes: - Mechanical falls - ICH - Fractures - Contusions - Soft tissue injury  Pt comes in post fall. Clinical concerns for hip fracture. Pt is on coumadin, CT head has been ordered due to fall on coumadin.   Final Clinical Impressions(s) / ED Diagnoses   Final diagnoses:  Hematoma  Left hip pain  Fall, initial encounter    New Prescriptions New Prescriptions   No medications on file     Derwood Kaplan, MD 11/26/16 [phone]

## 2016-11-25 NOTE — ED Notes (Signed)
Attempted to ambulate patient but never made it out of the room because he felt as if he was going to fall. Pt in pain and needed assist of 1 person from staff.

## 2016-11-26 MED ORDER — VITAMIN B-1 100 MG PO TABS
100.0000 mg | ORAL_TABLET | Freq: Every day | ORAL | Status: DC
  Administered 2016-11-26 – 2016-11-27 (×2): 100 mg via ORAL
  Filled 2016-11-26 (×2): qty 1

## 2016-11-26 MED ORDER — OXYCODONE-ACETAMINOPHEN 5-325 MG PO TABS
1.0000 | ORAL_TABLET | Freq: Four times a day (QID) | ORAL | Status: DC | PRN
  Administered 2016-11-26 – 2016-11-27 (×3): 1 via ORAL
  Filled 2016-11-26 (×3): qty 1

## 2016-11-26 MED ORDER — METOPROLOL SUCCINATE 12.5 MG HALF TABLET
12.5000 mg | ORAL_TABLET | Freq: Every day | ORAL | Status: DC
  Administered 2016-11-26: 12.5 mg via ORAL
  Filled 2016-11-26 (×2): qty 1

## 2016-11-26 MED ORDER — ACETAMINOPHEN 325 MG PO TABS
650.0000 mg | ORAL_TABLET | Freq: Four times a day (QID) | ORAL | Status: DC | PRN
  Administered 2016-11-26: 650 mg via ORAL
  Filled 2016-11-26: qty 2

## 2016-11-26 MED ORDER — FUROSEMIDE 80 MG PO TABS
80.0000 mg | ORAL_TABLET | Freq: Two times a day (BID) | ORAL | Status: DC
  Administered 2016-11-26 – 2016-11-27 (×4): 80 mg via ORAL
  Filled 2016-11-26 (×5): qty 1

## 2016-11-26 NOTE — ED Notes (Signed)
Bed: [facility]27 Expected date:  Expected time:  Means of arrival:  Comments: Hold for room 22

## 2016-11-26 NOTE — Progress Notes (Signed)
CSW spoke with EDP regarding patient. PT to be consulted.

## 2016-11-26 NOTE — Progress Notes (Signed)
CSW contacted patient's brother (Gene Nankervis [phone]) and provided update, patient aware and has provided verbal consent to update patient's brother about patient's discharge planning.

## 2016-11-26 NOTE — Progress Notes (Signed)
CSW spoke with patient at bedside. CSW introduced self and inquired about patient's recent fall. Patient reported that he fell after tripping over his pants. Patient reported that he resides with his father and that he was recently at [facility] a week ago and was released because he was doing better. Patient reports that he utilizes a walker to get around. Patient attempted to call his father Jonnie Punter [phone]), no answer. CSW agreed to attempt to contact patient's father at a later time. CSW inquired about any other family members that CSW could contact patient reported his brother (Gene Coney [phone]).   CSW contacted patient's brother (Gene Handyside [phone]) to obtain collateral information. Patient's brother reports that patient resides with his 70 year old father that is recovering from a stroke. Patient's brother reports that patient can't complete ADLs without assistance from his father. Patient's brother reports that patient has been home from [facility] for 9 days and has encountered 3 falls. Patient's brother reports that patient's father is no longer able to assist patient with ADLs. Patient's brother reports that patient needs SNF due to patient's father being unable to care for patient within the home. CSW thanked patient's brother for the information provided.

## 2016-11-26 NOTE — Progress Notes (Signed)
CSW provided with patient's home health agency ([facility] Marylene Land [phone]) contact information by patient's RN. CSW contacted [facility] and spoke with staff member Marylene Land. Staff reports that the patient receives skilled nursing, PT and OT services for about an hour per service randomly throughout the week. Staff reports that patient started receiving services when patient was d/c from [facility]. Staff reports that if patient is admitted the home health agency will need a resumption of care order when patient is d/c.

## 2016-11-26 NOTE — Progress Notes (Addendum)
CSW called and spoke to [name] in admitting at [facility], the pt's first choice and learned the pt was recently discharged from there and thus, an authorization from [name], the pt's managed care insurance is necessary.  [name] stated she would begin the auth process but it would be 2/16 before she found out if the pt would be authorized.  CSW advised the EDP and called the pt's brother [name] at ph: [phone] to update and left VM asking for a return call.    4:59 PM CSW spoke to pt's brother, updated him and informed pt's brother answers on pt's [employer] insurance authorization outcome are expected to be available on 2/16 and that pt's brother can call CSW for update then.  Dorothe Pea. Vernecia Umble, Theresia Majors, LCAS Clinical Social Worker Ph: [phone]

## 2016-11-26 NOTE — ED Notes (Signed)
PT at bedside.

## 2016-11-26 NOTE — Progress Notes (Signed)
CSW contacted PT to follow up with patient's consult. Staff reported that page would be sent for patient to be seen by PT.

## 2016-11-26 NOTE — Progress Notes (Signed)
CSW contacted PT to inquire about patient's consult. CSW spoke with staff, staff reported that no consult was entered for patient. CSW notified patient's EDP, EDP reports that patient's PT consult was ordered this morning.

## 2016-11-26 NOTE — [place] FL2 (Signed)
[place] MEDICAID FL2 LEVEL OF CARE SCREENING TOOL     IDENTIFICATION  Patient Name: [name] Birthdate: 04-01-1946 Sex: male Admission Date (Current Location): 11/25/2016  [facility] and [place] Number:  Producer, television/film/video and Address:  [facility],  [address] [place], [place] [address]      Provider Number: [id number]  Attending Physician Name and Address:  Jacalyn Lefevre, MD  Relative Name and Phone Number:  [name] [phone]    Current Level of Care: Hospital Recommended Level of Care: Skilled Nursing Facility Prior Approval Number:    Date Approved/Denied:   PASRR Number: [id number]  Discharge Plan: SNF    Current Diagnoses: Patient Active Problem List   Diagnosis Date Noted  . Hypertensive heart disease with heart failure (HCC)   . Acute on chronic diastolic heart failure (HCC)   . V-tach (HCC) 10/31/2016  . CHF exacerbation (HCC) 10/30/2016  . CHF (congestive heart failure) (HCC) 10/30/2016  . Bradycardia 03/16/2016  . Alcohol abuse 03/16/2016  . Acute encephalopathy 03/16/2016  . Alcohol withdrawal delirium (HCC) 03/16/2016  . Cor, pulmonale, acute (HCC)   . CAP (community acquired pneumonia) 03/06/2016  . Weakness of both lower extremities 03/04/2016  . Morbid obesity - BMI 45 10/23/2015  . Current use of [place] term anticoagulation 10/23/2015  . 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 Diastolic dysfunction with acute on chronic heart failure (HCC) 10/23/2015  . 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 Recurrent falls 10/20/2015  . Physical deconditioning 10/20/2015  . Hyperkalemia 10/20/2015  . Fall   . Streptococcus viridans infection 02/13/2015  . Sepsis (HCC) 02/07/2015  . Anemia of chronic disease 02/07/2015  . Alcoholic hepatitis 02/07/2015  . Thrombocytopenia (HCC) 02/07/2015  . Elevated troponin 02/06/2015  . Hematoma 04/24/2013  . Chronic atrial fibrillation (HCC) 04/24/2013  . Acute respiratory failure with  hypoxia (HCC) 04/23/2013  . Cellulitis 04/23/2013  . Acute on chronic systolic congestive heart failure (HCC) 04/22/2013  . Essential hypertension 12/06/2009    Orientation RESPIRATION BLADDER Height & Weight     Self, Situation, Place  Normal External catheter Weight: 261 lb (118.4 kg) Height:  6\' 1"  (185.4 cm)  BEHAVIORAL SYMPTOMS/MOOD NEUROLOGICAL BOWEL NUTRITION STATUS   (none)   Continent Diet (heart healthy)  AMBULATORY STATUS COMMUNICATION OF NEEDS Skin   Extensive Assist Verbally Skin abrasions (open wound on lower right leg; dressed daily)                       Personal Care Assistance Level of Assistance  Feeding Bathing Assistance: Maximum assistance Feeding assistance: Limited assistance Dressing Assistance: Maximum assistance     Functional Limitations Info  Sight, Hearing, Speech Sight Info: Impaired (wears glasses) Hearing Info: Adequate Speech Info: Adequate    SPECIAL CARE FACTORS FREQUENCY  PT (By licensed PT), OT (By licensed OT)     PT Frequency: 5x/week OT Frequency: 5x/week            Contractures Contractures Info: Not present    Additional Factors Info  Code Status Code Status Info: DNR Allergies Info: No Known Allergies           Current Medications (11/26/2016):  This is the current hospital active medication list Current Facility-Administered Medications  Medication Dose Route Frequency Provider Last Rate Last Dose  . acetaminophen (TYLENOL) tablet 650 mg  650 mg Oral Q6H PRN Derwood Kaplan, MD   650 mg at 11/26/16 1138  . fentaNYL (SUBLIMAZE)  injection 50 mcg  50 mcg Intravenous Q2H PRN Derwood Kaplan, MD   50 mcg at 11/26/16 0949  . furosemide (LASIX) tablet 80 mg  80 mg Oral BID Derwood Kaplan, MD   80 mg at 11/26/16 0948  . metoprolol succinate (TOPROL-XL) 24 hr tablet 12.5 mg  12.5 mg Oral Daily Ankit Nanavati, MD   12.5 mg at 11/26/16 1038  . thiamine (VITAMIN B-1) tablet 100 mg  100 mg Oral Daily Derwood Kaplan, MD   100  mg at 11/26/16 1038   Current Outpatient Prescriptions  Medication Sig Dispense Refill  . acetaminophen (TYLENOL) 325 MG tablet Take 2 tablets (650 mg total) by mouth every 6 (six) hours as needed for mild pain, moderate pain, fever or headache (or Fever >/= 101).    Marland Kitchen allopurinol (ZYLOPRIM) 100 MG tablet Take 100 mg by mouth 2 (two) times daily.     . folic acid (FOLVITE) 1 MG tablet Take 1 tablet (1 mg total) by mouth daily.    . furosemide (LASIX) 80 MG tablet Take 1 tablet (80 mg total) by mouth 2 (two) times daily. 30 tablet 0  . metoprolol succinate (TOPROL-XL) 25 MG 24 hr tablet Take 0.5 tablets (12.5 mg total) by mouth daily. 30 tablet 0  . Multiple Vitamin (MULTIVITAMIN WITH MINERALS) TABS tablet Take 1 tablet by mouth daily.    . potassium chloride SA (K-DUR,KLOR-CON) 20 MEQ tablet Take 1 tablet (20 mEq total) by mouth daily. (Patient taking differently: Take 20 mEq by mouth 2 (two) times daily. )    . pravastatin (PRAVACHOL) 10 MG tablet Take 10 mg by mouth daily.    Marland Kitchen thiamine 100 MG tablet Take 1 tablet (100 mg total) by mouth daily. 30 tablet 0  . warfarin (COUMADIN) 5 MG tablet Take 0.5 tablets (2.5 mg total) by mouth daily at 6 PM. (Patient taking differently: Take 2.5-5 mg by mouth as directed. Take 1 tablet (5 mg) on Sun, Tues, Thurs, Sat and Take 0.5 tablet (2.5 mg) on MWF)    . Amino Acids-Protein Hydrolys (FEEDING SUPPLEMENT, PRO-STAT SUGAR FREE 64,) LIQD Take 30 mLs by mouth 2 (two) times daily. (Patient not taking: Reported on 08/27/2016)    . feeding supplement, ENSURE ENLIVE, (ENSURE ENLIVE) LIQD Take 237 mLs by mouth 2 (two) times daily between meals.    Marland Kitchen guaiFENesin (MUCINEX) 600 MG 12 hr tablet Take 1 tablet (600 mg total) by mouth 2 (two) times daily. (Patient not taking: Reported on 11/25/2016)    . ipratropium-albuterol (DUONEB) 0.5-2.5 (3) MG/3ML SOLN Take 3 mLs by nebulization every 2 (two) hours as needed. (Patient not taking: Reported on 11/25/2016) 360 mL       Discharge Medications: Please see current medication list.  Relevant Imaging Results:  Relevant Lab Results:   Additional Information Social Security #: [id number]  Antionette Poles, LCSW

## 2016-11-26 NOTE — Evaluation (Signed)
Physical Therapy Evaluation Patient Details Name: [name] MRN: [id number] DOB: Mar 01, 1946 Today's Date: 11/26/2016   History of Present Illness  71 yo male can into the ED after a fall at home with great limiting pain in L hip area. x rays negative, however great amount of swelling, reddness, and heat in this left hip and lateral area.Hx of HTN, A fib, morbid obesity, CHF, alcoholism, cardiomyopathy, gout. Pt recently was a [facility] for REhab and felt great to have discharged home with good strength and mobilizing around house , cooking independently with RW. He had a nurse come wrap his lower legs every other day.  He is sadden that he tripped over his pants while ambulating.    Clinical Impression  Pt s/p fall while walking with RW in his home. (see note above) . Pt now very limited with his ability to mobilize due to weakness and acute pain. Pt has great reddness and heat to Left hip area, showed and made nurse aware.  Pt to greatly benefit from continued PT to help with safe transition back home as ultimate goal. REcommend ST-SNF.     Follow Up Recommendations SNF (wuld really benefit from short term rehab again. Would not be able to manage alone at home at this time )    Equipment Recommendations  Other (comment);Rolling walker with 5" wheels (pt already has equipment)    Recommendations for Other Services       Precautions / Restrictions Precautions Precautions: Fall Restrictions Weight Bearing Restrictions: No      Mobility  Bed Mobility Overal bed mobility: Needs Assistance Bed Mobility: Supine to Sit;Sit to Supine     Supine to sit: Mod assist Sit to supine: Max assist   General bed mobility comments: great difficulty scooting hips to EOB, this was a max assist , and scooting up in bed was a +2 assist , and rolling +2 assist . Sat EOB for 5 minutes to help his endurance and he was very motivated to try to not loose ground on his strenght that he had worked on .    Transfers Overall transfer level:  (unable to attempt due to limited ability even to get to EOB and pt reported he tried to stand earlier and was not able and did not want to try again due to the pain in Left hip area at this time )                  Ambulation/Gait                Stairs            Wheelchair Mobility    Modified Rankin (Stroke Patients Only)       Balance                                             Pertinent Vitals/Pain Pain Assessment: 0-10 Pain Score: 7  Pain Location: great pain with palplationa nd especially rolling and moving towrds the Left side  Pain Descriptors / Indicators: Aching;Constant;Grimacing Pain Intervention(s): Monitored during session;Premedicated before session;Repositioned    Home Living Family/patient expects to be discharged to:: Private residence Living Arrangements: Parent (elderly father 90+yo) Available Help at Discharge: Family;Available PRN/intermittently Type of Home: House Home Access: Ramped entrance   Entrance Stairs-Number of Steps: ramp at one step entrance Home Layout: One level Home  Equipment: Grab bars - tub/shower;Walker - 2 wheels;Hospital bed      Prior Function Level of Independence: Independent with assistive device(s)   Gait / Transfers Assistance Needed: pt reports he uses a walker for ambulation  ADL's / Homemaking Assistance Needed: Pt reports he performs ADLS unassisted.   Comments: multiple falls at home per chart.      Hand Dominance        Extremity/Trunk Assessment        Lower Extremity Assessment Lower Extremity Assessment: Generalized weakness (limited with movment also due to pain in back adn Left hip area )       Communication   Communication: No difficulties  Cognition Arousal/Alertness: Awake/alert Behavior During Therapy: WFL for tasks assessed/performed Overall Cognitive Status: Within Functional Limits for tasks assessed                       General Comments      Exercises Other Exercises Other Exercises: sat EOB and did do ankle pumps and long arc quads.    Assessment/Plan    PT Assessment Patient needs continued PT services  PT Problem List Decreased strength;Decreased activity tolerance;Decreased mobility          PT Treatment Interventions Gait training;Functional mobility training;Therapeutic activities;Therapeutic exercise;Patient/family education    PT Goals (Current goals can be found in the Care Plan section)  Acute Rehab PT Goals Patient Stated Goal: I don't want to loose the strtength I had gained, so sad about this set back.  PT Goal Formulation: With patient Time For Goal Achievement: 12/10/16    Frequency Min 3X/week   Barriers to discharge Decreased caregiver support      Co-evaluation               End of Session   Activity Tolerance: Patient tolerated treatment well Patient left: in bed;with call bell/phone within reach;with nursing/sitter in room Nurse Communication: Mobility status    Functional Assessment Tool Used: clinical judgement Functional Limitation: Mobility: Walking and moving around Mobility: Walking and Moving Around Current Status (I3437): At least 80 percent but less than 100 percent impaired, limited or restricted Mobility: Walking and Moving Around Goal Status [phone]): At least 1 percent but less than 20 percent impaired, limited or restricted    Time: 1301-1330 PT Time Calculation (min) (ACUTE ONLY): 29 min   Charges:   PT Evaluation $PT Eval Low Complexity: 1 Procedure PT Treatments $Therapeutic Activity: 8-22 mins   PT G Codes:   PT G-Codes **NOT FOR INPATIENT CLASS** Functional Assessment Tool Used: clinical judgement Functional Limitation: Mobility: Walking and moving around Mobility: Walking and Moving Around Current Status (B8478): At least 80 percent but less than 100 percent impaired, limited or restricted Mobility: Walking and  Moving Around Goal Status [phone]): At least 1 percent but less than 20 percent impaired, limited or restricted    Marella Bile 11/26/2016, 2:08 PM  Marella Bile, PT Pager: [phone] 11/26/2016

## 2016-11-26 NOTE — NC FL2 (Signed)
[place] MEDICAID FL2 LEVEL OF CARE SCREENING TOOL     IDENTIFICATION  Patient Name: [name] Birthdate: 04-01-1946 Sex: male Admission Date (Current Location): 11/25/2016  [facility] and [place] Number:  Producer, television/film/video and Address:  [facility],  [address] [place], [place] [address]      Provider Number: [id number]  Attending Physician Name and Address:  Jacalyn Lefevre, MD  Relative Name and Phone Number:  [name] [phone]    Current Level of Care: Hospital Recommended Level of Care: Skilled Nursing Facility Prior Approval Number:    Date Approved/Denied:   PASRR Number: [id number]  Discharge Plan: SNF    Current Diagnoses: Patient Active Problem List   Diagnosis Date Noted  . Hypertensive heart disease with heart failure (HCC)   . Acute on chronic diastolic heart failure (HCC)   . V-tach (HCC) 10/31/2016  . CHF exacerbation (HCC) 10/30/2016  . CHF (congestive heart failure) (HCC) 10/30/2016  . Bradycardia 03/16/2016  . Alcohol abuse 03/16/2016  . Acute encephalopathy 03/16/2016  . Alcohol withdrawal delirium (HCC) 03/16/2016  . Cor, pulmonale, acute (HCC)   . CAP (community acquired pneumonia) 03/06/2016  . Weakness of both lower extremities 03/04/2016  . Morbid obesity - BMI 45 10/23/2015  . Current use of Cleona Doubleday term anticoagulation 10/23/2015  . 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 Diastolic dysfunction with acute on chronic heart failure (HCC) 10/23/2015  . 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 Recurrent falls 10/20/2015  . Physical deconditioning 10/20/2015  . Hyperkalemia 10/20/2015  . Fall   . Streptococcus viridans infection 02/13/2015  . Sepsis (HCC) 02/07/2015  . Anemia of chronic disease 02/07/2015  . Alcoholic hepatitis 02/07/2015  . Thrombocytopenia (HCC) 02/07/2015  . Elevated troponin 02/06/2015  . Hematoma 04/24/2013  . Chronic atrial fibrillation (HCC) 04/24/2013  . Acute respiratory failure with  hypoxia (HCC) 04/23/2013  . Cellulitis 04/23/2013  . Acute on chronic systolic congestive heart failure (HCC) 04/22/2013  . Essential hypertension 12/06/2009    Orientation RESPIRATION BLADDER Height & Weight     Self, Situation, Place  Normal External catheter Weight: 261 lb (118.4 kg) Height:  6\' 1"  (185.4 cm)  BEHAVIORAL SYMPTOMS/MOOD NEUROLOGICAL BOWEL NUTRITION STATUS   (none)   Continent Diet (heart healthy)  AMBULATORY STATUS COMMUNICATION OF NEEDS Skin   Extensive Assist Verbally Skin abrasions (open wound on lower right leg; dressed daily)                       Personal Care Assistance Level of Assistance  Feeding Bathing Assistance: Maximum assistance Feeding assistance: Limited assistance Dressing Assistance: Maximum assistance     Functional Limitations Info  Sight, Hearing, Speech Sight Info: Impaired (wears glasses) Hearing Info: Adequate Speech Info: Adequate    SPECIAL CARE FACTORS FREQUENCY  PT (By licensed PT), OT (By licensed OT)     PT Frequency: 5x/week OT Frequency: 5x/week            Contractures Contractures Info: Not present    Additional Factors Info  Code Status Code Status Info: DNR Allergies Info: No Known Allergies           Current Medications (11/26/2016):  This is the current hospital active medication list Current Facility-Administered Medications  Medication Dose Route Frequency Provider Last Rate Last Dose  . acetaminophen (TYLENOL) tablet 650 mg  650 mg Oral Q6H PRN Derwood Kaplan, MD   650 mg at 11/26/16 1138  . fentaNYL (SUBLIMAZE)  injection 50 mcg  50 mcg Intravenous Q2H PRN Derwood Kaplan, MD   50 mcg at 11/26/16 0949  . furosemide (LASIX) tablet 80 mg  80 mg Oral BID Derwood Kaplan, MD   80 mg at 11/26/16 0948  . metoprolol succinate (TOPROL-XL) 24 hr tablet 12.5 mg  12.5 mg Oral Daily Ankit Nanavati, MD   12.5 mg at 11/26/16 1038  . thiamine (VITAMIN B-1) tablet 100 mg  100 mg Oral Daily Derwood Kaplan, MD   100  mg at 11/26/16 1038   Current Outpatient Prescriptions  Medication Sig Dispense Refill  . acetaminophen (TYLENOL) 325 MG tablet Take 2 tablets (650 mg total) by mouth every 6 (six) hours as needed for mild pain, moderate pain, fever or headache (or Fever >/= 101).    Marland Kitchen allopurinol (ZYLOPRIM) 100 MG tablet Take 100 mg by mouth 2 (two) times daily.     . folic acid (FOLVITE) 1 MG tablet Take 1 tablet (1 mg total) by mouth daily.    . furosemide (LASIX) 80 MG tablet Take 1 tablet (80 mg total) by mouth 2 (two) times daily. 30 tablet 0  . metoprolol succinate (TOPROL-XL) 25 MG 24 hr tablet Take 0.5 tablets (12.5 mg total) by mouth daily. 30 tablet 0  . Multiple Vitamin (MULTIVITAMIN WITH MINERALS) TABS tablet Take 1 tablet by mouth daily.    . potassium chloride SA (K-DUR,KLOR-CON) 20 MEQ tablet Take 1 tablet (20 mEq total) by mouth daily. (Patient taking differently: Take 20 mEq by mouth 2 (two) times daily. )    . pravastatin (PRAVACHOL) 10 MG tablet Take 10 mg by mouth daily.    Marland Kitchen thiamine 100 MG tablet Take 1 tablet (100 mg total) by mouth daily. 30 tablet 0  . warfarin (COUMADIN) 5 MG tablet Take 0.5 tablets (2.5 mg total) by mouth daily at 6 PM. (Patient taking differently: Take 2.5-5 mg by mouth as directed. Take 1 tablet (5 mg) on Sun, Tues, Thurs, Sat and Take 0.5 tablet (2.5 mg) on MWF)    . Amino Acids-Protein Hydrolys (FEEDING SUPPLEMENT, PRO-STAT SUGAR FREE 64,) LIQD Take 30 mLs by mouth 2 (two) times daily. (Patient not taking: Reported on 08/27/2016)    . feeding supplement, ENSURE ENLIVE, (ENSURE ENLIVE) LIQD Take 237 mLs by mouth 2 (two) times daily between meals.    Marland Kitchen guaiFENesin (MUCINEX) 600 MG 12 hr tablet Take 1 tablet (600 mg total) by mouth 2 (two) times daily. (Patient not taking: Reported on 11/25/2016)    . ipratropium-albuterol (DUONEB) 0.5-2.5 (3) MG/3ML SOLN Take 3 mLs by nebulization every 2 (two) hours as needed. (Patient not taking: Reported on 11/25/2016) 360 mL       Discharge Medications: Please see current medication list.  Relevant Imaging Results:  Relevant Lab Results:   Additional Information Social Security #: [id number]  Antionette Poles, LCSW

## 2016-11-26 NOTE — ED Notes (Signed)
Pt provided Sprite Zero.  Pt requested to remain on stretcher and declined transfer to hospital bed.

## 2016-11-26 NOTE — ED Provider Notes (Signed)
Pt still c/o pain this morning.  He has a gluteal hematoma causing pain.  CT negative for fracture.  Family still not answering phone per nursing.  SW has not seen patient yet.  I will re-consult.   Jacalyn Lefevre, MD 11/26/16 [phone]

## 2016-11-27 LAB — CBC
HEMATOCRIT: 28.4 % — AB (ref 39.0–52.0)
HEMOGLOBIN: 9.5 g/dL — AB (ref 13.0–17.0)
MCH: 34.2 pg — ABNORMAL HIGH (ref 26.0–34.0)
MCHC: 33.5 g/dL (ref 30.0–36.0)
MCV: 102.2 fL — ABNORMAL HIGH (ref 78.0–100.0)
Platelets: 169 10*3/uL (ref 150–400)
RBC: 2.78 MIL/uL — ABNORMAL LOW (ref 4.22–5.81)
RDW: 14.7 % (ref 11.5–15.5)
WBC: 6.8 10*3/uL (ref 4.0–10.5)

## 2016-11-27 LAB — PROTIME-INR
INR: 3.17
Prothrombin Time: 33.2 seconds — ABNORMAL HIGH (ref 11.4–15.2)

## 2016-11-27 NOTE — ED Notes (Addendum)
Patient reports he had a blister on his lower right leg which now is an open sore being treated with wraps.

## 2016-11-27 NOTE — ED Provider Notes (Addendum)
  Physical Exam  BP (!) 122/53 (BP Location: Left Arm)   Pulse 66   Temp 98.6 F (37 C) (Oral)   Resp 16   Ht 6\' 1"  (1.854 m)   Wt 261 lb (118.4 kg)   SpO2 93%   BMI 34.43 kg/m   Physical Exam  ED Course  Procedures  MDM Social work is having difficulty getting patient placed. Awaiting authorization that she thinks will likely be declined. PT had seen patient and stated that they need inpatient treatment. Hemoglobin had dropped somewhat and will recheck. Will reevaluate.       Benjiman Core, MD 11/27/16 1418  Hemoglobin is stable from 2 days ago. Has been accepted to [facility]. Will need following of the hemoglobin.    Benjiman Core, MD 11/27/16 [phone]

## 2016-11-27 NOTE — Progress Notes (Signed)
CSW scheduled PTAR for pick up. Number for report: [phone]. Patient will be going to room 304.  Stacy Gardner, LCSWA Clinical Social Worker [phone]

## 2016-11-27 NOTE — ED Notes (Signed)
PTAR called by Child psychotherapist, Herbalist.

## 2016-11-27 NOTE — ED Notes (Signed)
[facility] received paperwork on patient.

## 2016-11-27 NOTE — Progress Notes (Signed)
CSW received call from [place] with [facility] and they are able to accept patient today 2/16. [facility] did receive [employer] authorization. CSW will send AVS to facility and any other paperwork needed. CSW will update RN once patient is ready to DC.   Stacy Gardner, LCSWA Clinical Social Worker [phone]

## 2016-11-27 NOTE — ED Notes (Signed)
Wound care nurse came by and wrapped patient's right leg.

## 2016-11-27 NOTE — Discharge Instructions (Signed)
Your hemoglobin is mildly decreased and will need to be followed.

## 2016-11-27 NOTE — ED Notes (Signed)
Report called to [facility].  [facility] reports they have not received the paperwork for the patient to be transported there.

## 2016-11-27 NOTE — Consult Note (Signed)
WOC Nurse wound consult note Reason for Consult:Leg wounds Venous stasis ulcers Wound type:full thickness venous stasis ulcers Pressure Injury POA: NA Measurement:RLE pretibial 6cm x 6cm x 0.1cm  Dry pale bed, no drainage, no odor Distal pretibial 3cm x 2cm x 0.1cm 2cm x 2.5cm and 1cm x 1cm and 1cm x 0.5cm all red granulating with bed higher than skin level, no drainage or odor R lateral ankle 2cm x 2cm elevated red wound bed no drainage Posterior R calf 2cm x 1cm  Near achilles with elevated wound bed, red slight yellow drainage and odor 1cm x 2cm scabbed area upper right posterior calf Wound bed:see above Drainage (amount, consistency, odor) see above Periwound:dry scaly skin  Dressing procedure/placement/frequency: Applied profore, pt to be discharged to nursing facility, if pt still in hospital early next week WOC will return to  Change, otherwise not following, if need further assistance please re consult.   Barnett Hatter, RN-C, WTA-C Wound Treatment Associate

## 2016-11-27 NOTE — Progress Notes (Signed)
CSW spoke with patients brother, Gene, regarding discharge plans. Family requested [facility] SNF and CSW followed up with [place] regarding questions for insurance. Patient recently stated at [facility] for 12 days and it is unlikely for patient to receive authorization, per [facility] with [facility]. CSW updated family that if they are able to provide additional resources at home patient can DC. CSW also updated family regarding private pay at [employer]. Family stated they are unable to pay privately at SNF and would look at options for resources at home.   CSW will continue to update.    Stacy Gardner, LCSWA Clinical Social Worker [phone]

## 2016-11-27 NOTE — Progress Notes (Signed)
CSW spoke with Wille Celeste, with [facility], and is still wanting for [facility] authorization. CSW will continue to update.   Stacy Gardner, LCSWA Clinical Social Worker [phone]

## 2017-01-08 IMAGING — DX DG CHEST 2V
2 series · 2 of 2 positions shown · non-contrast
Comparison: Single view of the chest 10/14/2015.

CLINICAL DATA: Status post fall 10/19/2015.

EXAM:
CHEST  2 VIEW

[chest lat]
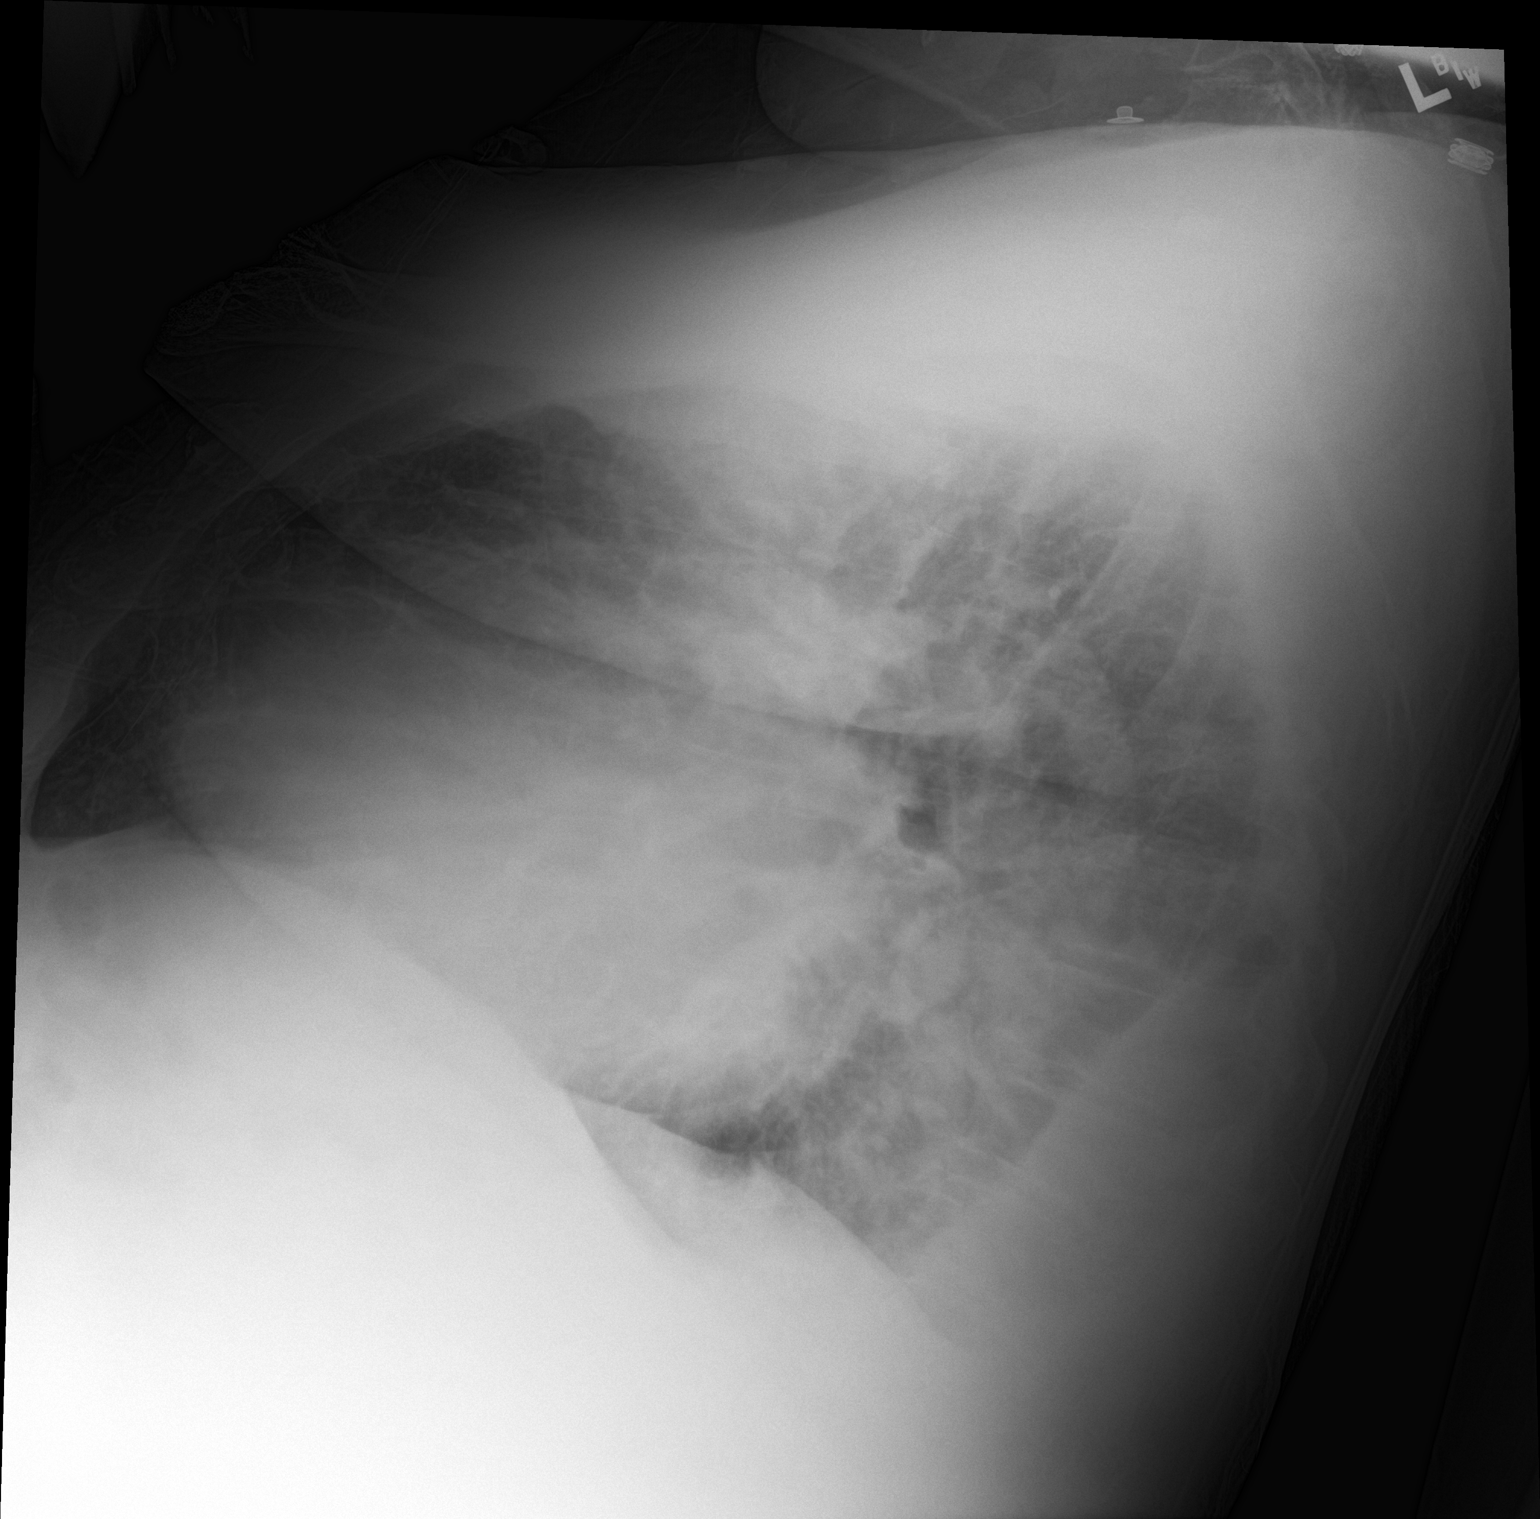

[chest ap]
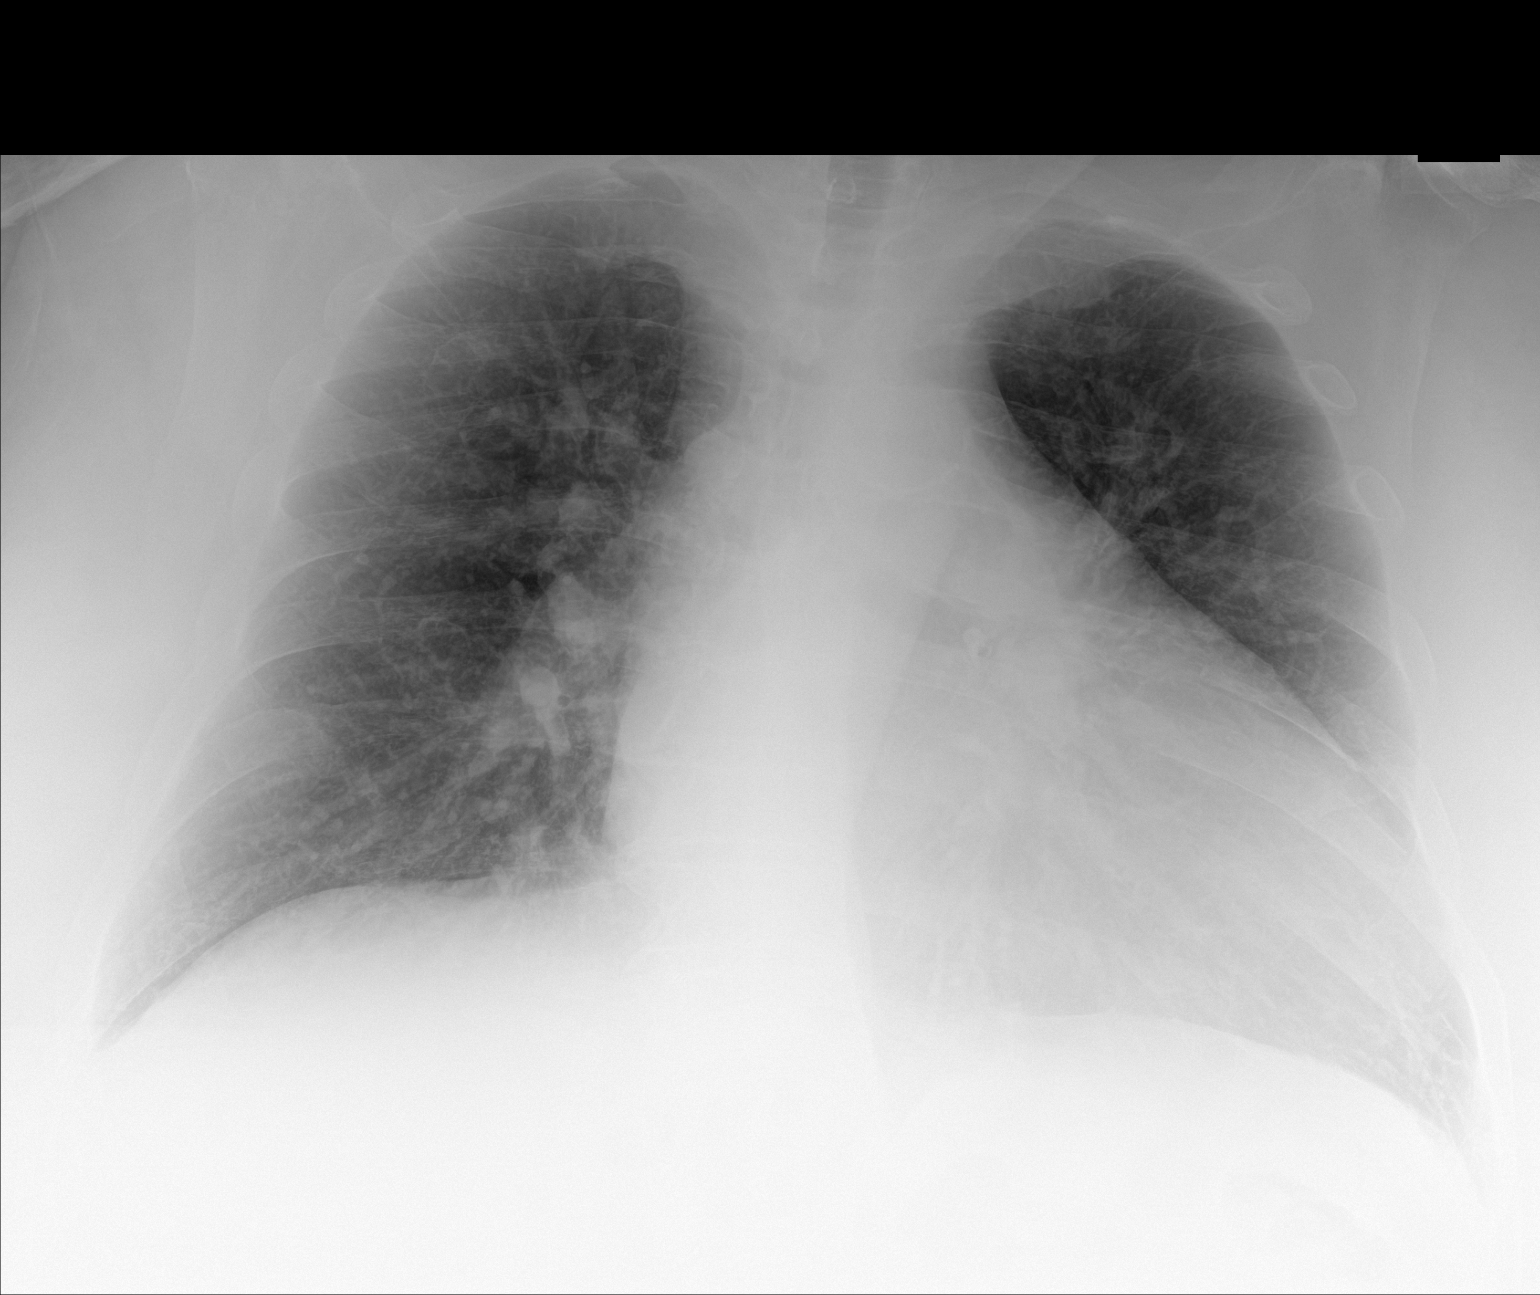

[2 of 2 positions shown; findings below may reference images not displayed]

FINDINGS: There is cardiomegaly without edema. No pneumothorax or pleural
effusion. No focal bony abnormality.
IMPRESSION: Cardiomegaly without acute disease.

## 2017-01-11 IMAGING — DX DG HIP (WITH OR WITHOUT PELVIS) 3-4V BILAT
6 series · 6 of 6 positions shown · non-contrast
Comparison: None.

CLINICAL DATA: Status post fall 10/19/2015 with continued right hip
pain. Initial encounter.

EXAM:
DG HIP (WITH OR WITHOUT PELVIS) 3-4V BILAT

[pelvis ap (1 of 2)]
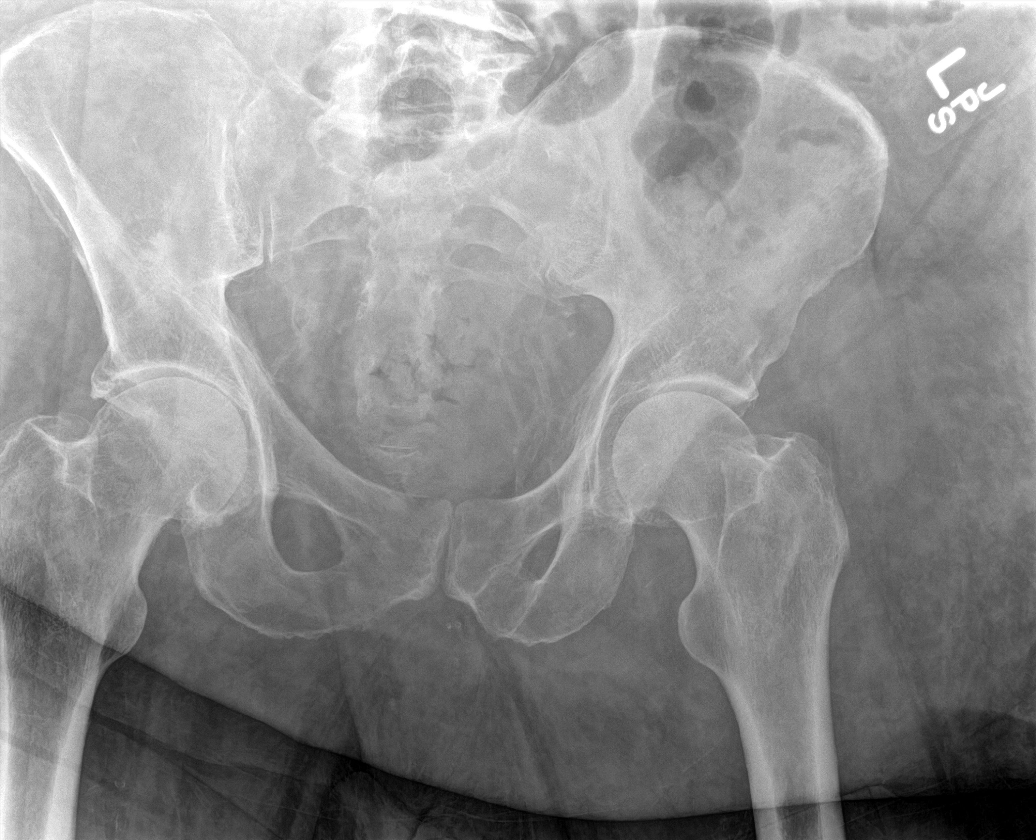

[hip ap (1 of 2)]
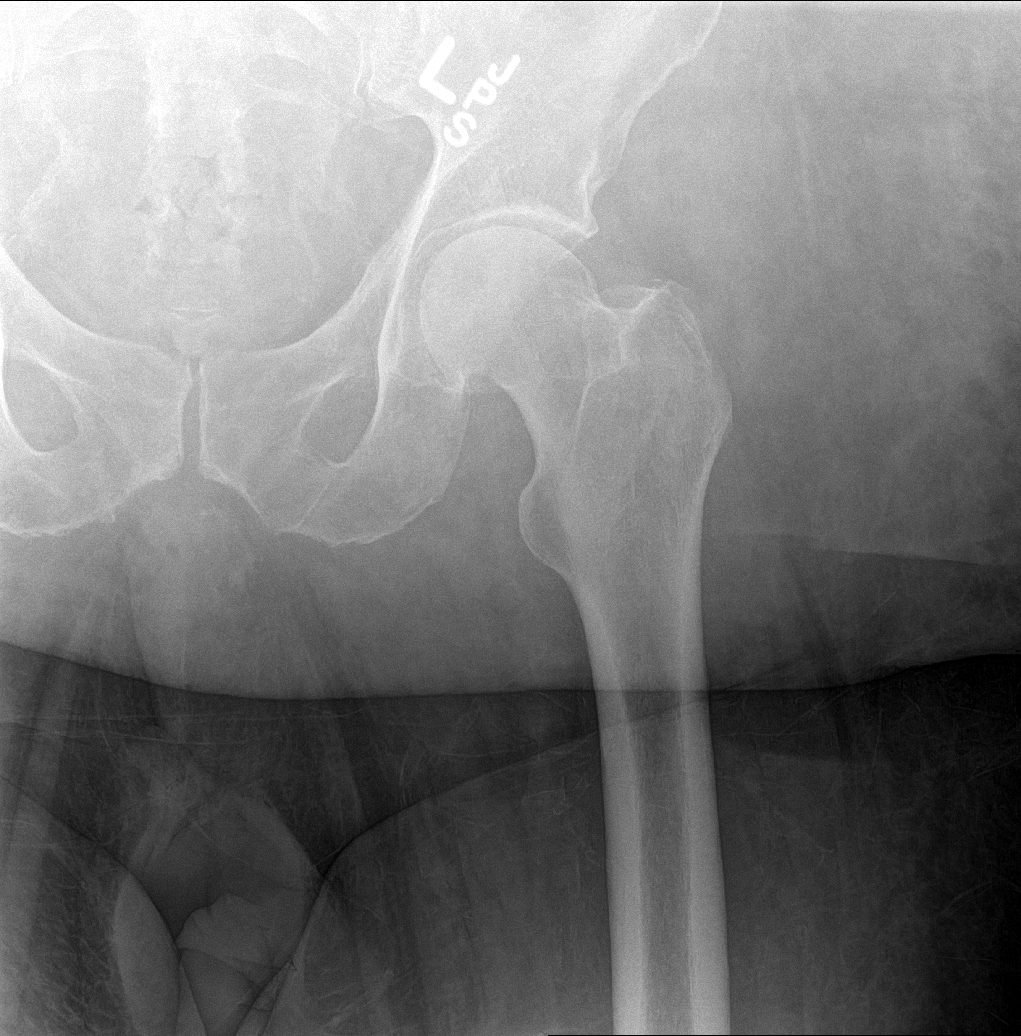

[hip lat (1 of 2)]
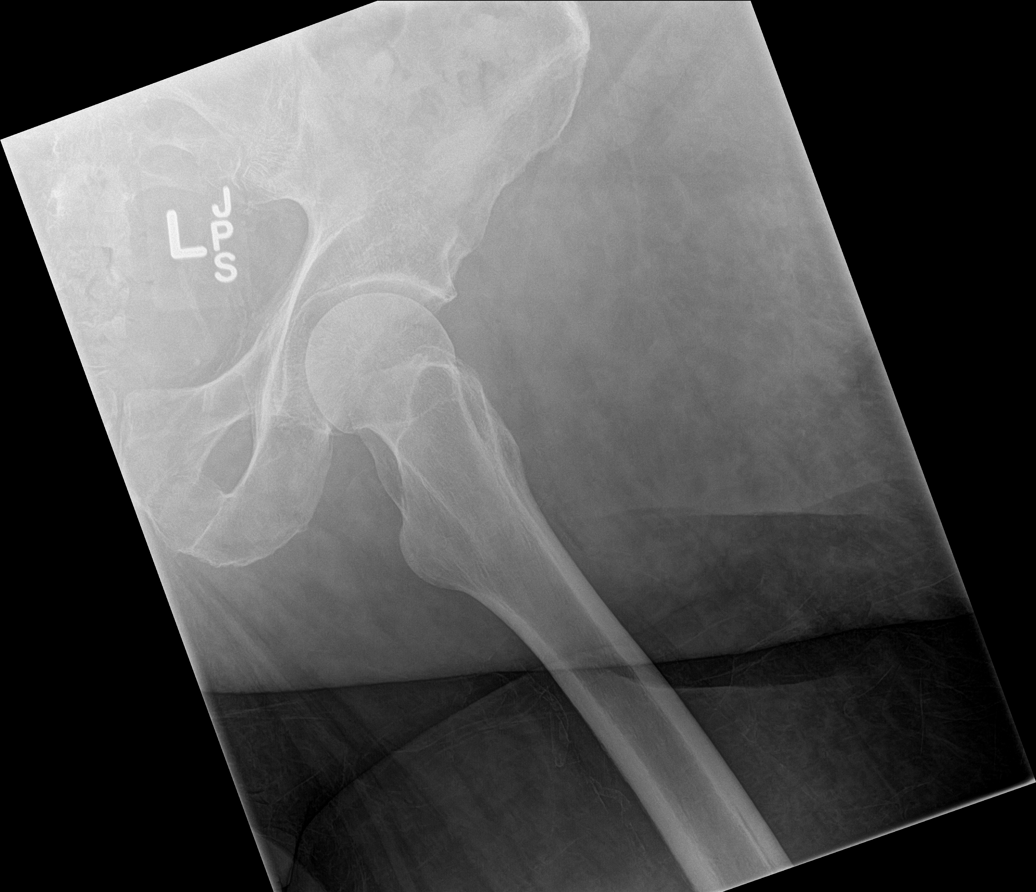

[hip ap (2 of 2)]
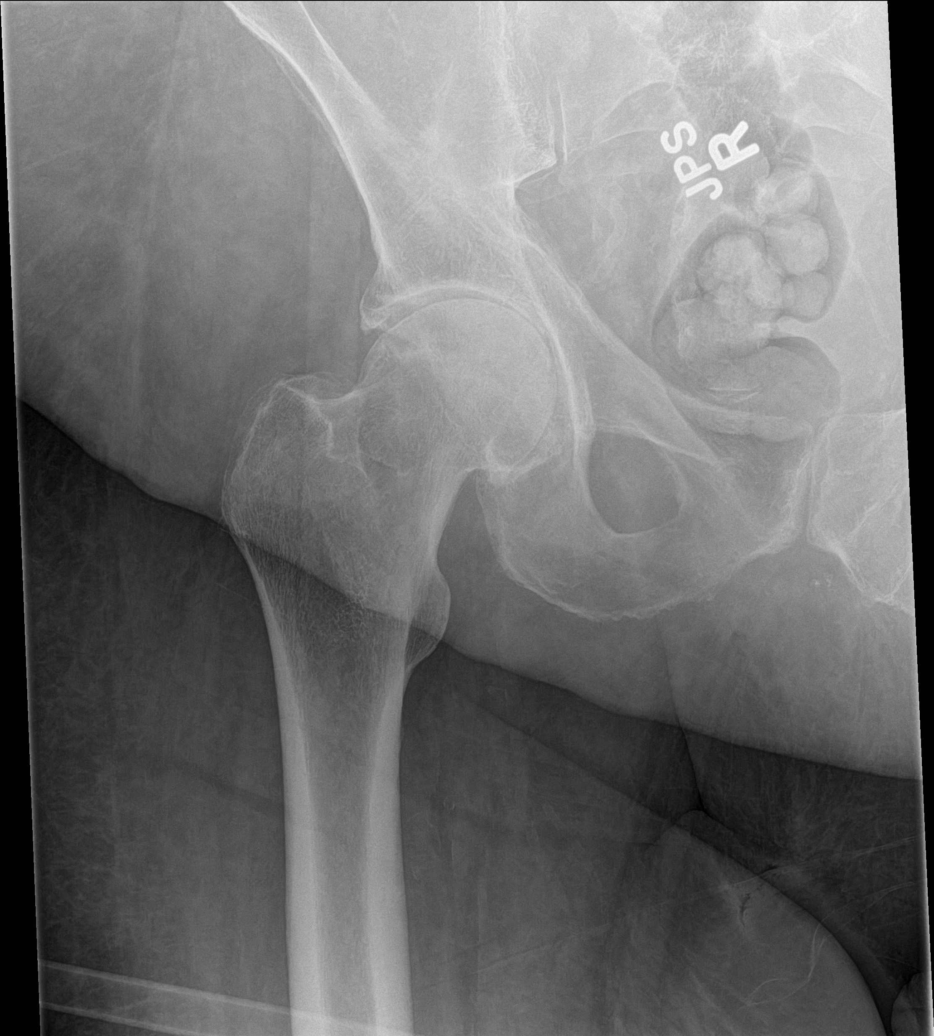

[hip lat (2 of 2)]
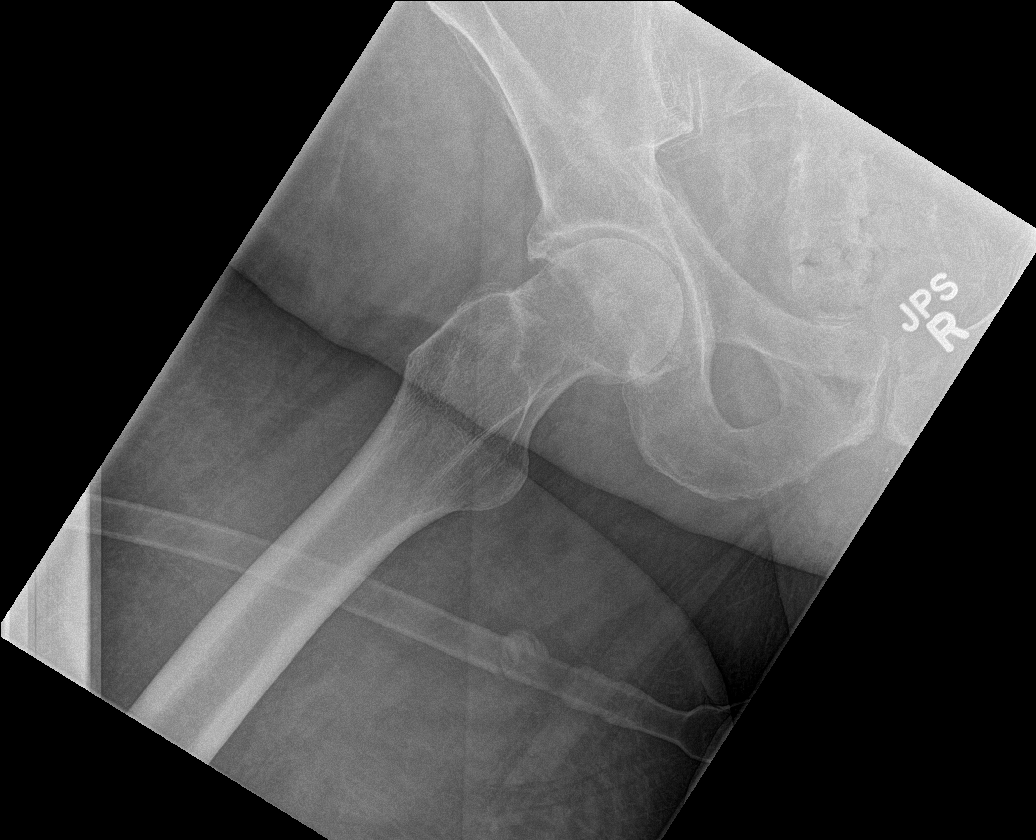

[pelvis ap (2 of 2)]
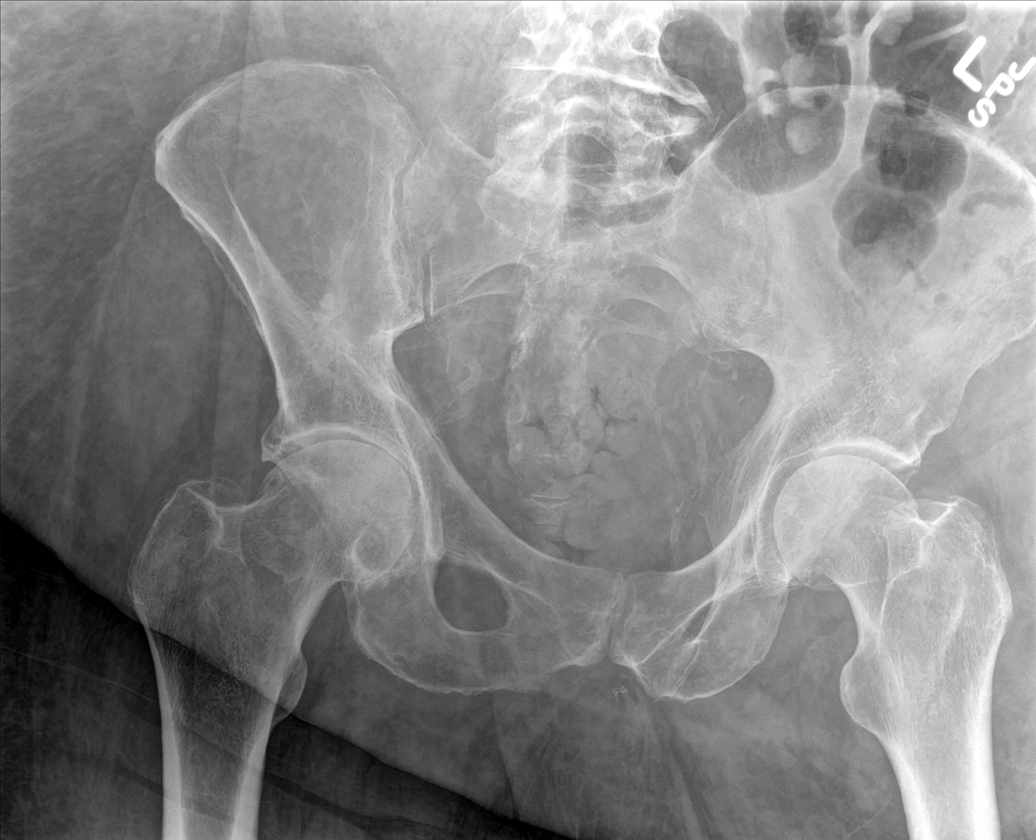

[6 of 6 positions shown; findings below may reference images not displayed]

FINDINGS: No acute bony or joint abnormality is seen on the right or left.
Mild to moderate bilateral hip osteoarthritis appears worse on the
right. Lower lumbar spondylosis is partially visualized.
IMPRESSION: No acute abnormality.

Right worse than left hip osteoarthritis.

Lower lumbar spondylosis.

## 2017-01-30 ENCOUNTER — Emergency Department (HOSPITAL_COMMUNITY)
Admission: EM | Admit: 2017-01-30 | Discharge: 2017-01-30 | Disposition: A | Discharge status: Home / Self Care | Payer: Medicare HMO | Attending: Emergency Medicine | Admitting: Emergency Medicine

## 2017-01-30 ENCOUNTER — Encounter (HOSPITAL_COMMUNITY): Payer: Self-pay | Admitting: Emergency Medicine

## 2017-01-30 DIAGNOSIS — W19XXXA Unspecified fall, initial encounter: Secondary | ICD-10-CM

## 2017-01-30 DIAGNOSIS — S59912A Unspecified injury of left forearm, initial encounter: Secondary | ICD-10-CM | POA: Diagnosis present

## 2017-01-30 DIAGNOSIS — Z7901 Long term (current) use of anticoagulants: Secondary | ICD-10-CM | POA: Insufficient documentation

## 2017-01-30 DIAGNOSIS — S51812A Laceration without foreign body of left forearm, initial encounter: Secondary | ICD-10-CM | POA: Insufficient documentation

## 2017-01-30 DIAGNOSIS — Z87891 Personal history of nicotine dependence: Secondary | ICD-10-CM | POA: Insufficient documentation

## 2017-01-30 DIAGNOSIS — L98491 Non-pressure chronic ulcer of skin of other sites limited to breakdown of skin: Secondary | ICD-10-CM

## 2017-01-30 DIAGNOSIS — W010XXA Fall on same level from slipping, tripping and stumbling without subsequent striking against object, initial encounter: Secondary | ICD-10-CM | POA: Diagnosis not present

## 2017-01-30 DIAGNOSIS — L97219 Non-pressure chronic ulcer of right calf with unspecified severity: Secondary | ICD-10-CM | POA: Diagnosis not present

## 2017-01-30 DIAGNOSIS — Y9301 Activity, walking, marching and hiking: Secondary | ICD-10-CM | POA: Diagnosis not present

## 2017-01-30 DIAGNOSIS — I5043 Acute on chronic combined systolic (congestive) and diastolic (congestive) heart failure: Secondary | ICD-10-CM | POA: Insufficient documentation

## 2017-01-30 DIAGNOSIS — Y999 Unspecified external cause status: Secondary | ICD-10-CM | POA: Insufficient documentation

## 2017-01-30 DIAGNOSIS — I110 Hypertensive heart disease with heart failure: Secondary | ICD-10-CM | POA: Diagnosis not present

## 2017-01-30 DIAGNOSIS — Y92009 Unspecified place in unspecified non-institutional (private) residence as the place of occurrence of the external cause: Secondary | ICD-10-CM | POA: Insufficient documentation

## 2017-01-30 NOTE — ED Notes (Signed)
ED Provider at bedside. 

## 2017-01-30 NOTE — ED Triage Notes (Signed)
Pt arrives via ptar from home, pt reports he was walking to the restroom when he slipped on the floor and fell. Reports pain to left arm, skin tears present. Pt a/ox4, resp e/u, VSS.

## 2017-01-30 NOTE — ED Provider Notes (Signed)
[facility]-EMERGENCY DEPT Provider Note   CSN: [id number] Arrival date & time: 01/30/17  4782     History   Chief Complaint Chief Complaint  Patient presents with  . Fall    HPI [name] is a 71 y.o. male.  Accidental trip and fall this morning striking proximal left forearm. Now with skin tear in same. No head or neck trauma. No bony tenderness. Severity of pain is mild. Review of systems positive for a right calf ulcer.  No neurological deficits.      Past Medical History:  Diagnosis Date  . Alcoholism (HCC)   . Anemia   . Atrial fibrillation or flutter   . Cardiomyopathy    nonischemic  . CHF (congestive heart failure) (HCC)    ejection fractio of 45% per echo in 2011 (previos EF 35-40% in 2003), echo 04/2013  -LVEF 50-55%.  . Complication of anesthesia    April 11, 2013  . GERD (gastroesophageal reflux disease)    takes tums prn  . Hypertension   . Hypoxemia   . Obesity   . Ventricular tachycardia (HCC)    nonsustained, asymptomatic    Patient Active Problem List   Diagnosis Date Noted  . Hypertensive heart disease with heart failure (HCC)   . Acute on chronic diastolic heart failure (HCC)   . V-tach (HCC) 10/31/2016  . CHF exacerbation (HCC) 10/30/2016  . CHF (congestive heart failure) (HCC) 10/30/2016  . Bradycardia 03/16/2016  . Alcohol abuse 03/16/2016  . Acute encephalopathy 03/16/2016  . Alcohol withdrawal delirium (HCC) 03/16/2016  . Cor, pulmonale, acute (HCC)   . CAP (community acquired pneumonia) 03/06/2016  . Weakness of both lower extremities 03/04/2016  . Morbid obesity - BMI 45 10/23/2015  . Current use of long term anticoagulation 10/23/2015  . 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 Diastolic dysfunction with acute on chronic heart failure (HCC) 10/23/2015  . 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 Recurrent falls 10/20/2015  . Physical deconditioning 10/20/2015  . Hyperkalemia 10/20/2015  . Fall   . Streptococcus viridans infection  02/13/2015  . Sepsis (HCC) 02/07/2015  . Anemia of chronic disease 02/07/2015  . Alcoholic hepatitis 02/07/2015  . Thrombocytopenia (HCC) 02/07/2015  . Elevated troponin 02/06/2015  . Hematoma 04/24/2013  . Chronic atrial fibrillation (HCC) 04/24/2013  . Acute respiratory failure with hypoxia (HCC) 04/23/2013  . Cellulitis 04/23/2013  . Acute on chronic systolic congestive heart failure (HCC) 04/22/2013  . Essential hypertension 12/06/2009    Past Surgical History:  Procedure Laterality Date  . I&D EXTREMITY Right 06/28/2013   Procedure: IRRIGATION AND DEBRIDEMENT OF RIGHT LEG WITH SURGICAL PREP/PLACEMENT OF ACELL ;  Surgeon: Wayland Denis, DO;  Location: [facility] OR;  Service: Plastics;  Laterality: Right;  . KNEE BURSECTOMY Right 04/20/2013   Procedure: Right knee prepatella bursa decompression;  Surgeon: Cammy Copa, MD;  Location: [facility] OR;  Service: Orthopedics;  Laterality: Right;  . TONSILLECTOMY         Home Medications    Prior to Admission medications   Medication Sig Start Date End Date Taking? Authorizing Provider  acetaminophen (TYLENOL) 325 MG tablet Take 2 tablets (650 mg total) by mouth every 6 (six) hours as needed for mild pain, moderate pain, fever or headache (or Fever >/= 101). 10/25/15   Elease Etienne, MD  allopurinol (ZYLOPRIM) 100 MG tablet Take 100 mg by mouth 2 (two) times daily.  01/14/16   Historical Provider, MD  Amino Acids-Protein Hydrolys (FEEDING SUPPLEMENT,  PRO-STAT SUGAR FREE 64,) LIQD Take 30 mLs by mouth 2 (two) times daily. Patient not taking: Reported on 08/27/2016 10/25/15   Elease Etienne, MD  feeding supplement, ENSURE ENLIVE, (ENSURE ENLIVE) LIQD Take 237 mLs by mouth 2 (two) times daily between meals. 10/25/15   Elease Etienne, MD  folic acid (FOLVITE) 1 MG tablet Take 1 tablet (1 mg total) by mouth daily. 11/05/16   Filbert Schilder, MD  furosemide (LASIX) 80 MG tablet Take 1 tablet (80 mg total) by mouth 2 (two) times daily. 11/04/16    Filbert Schilder, MD  guaiFENesin (MUCINEX) 600 MG 12 hr tablet Take 1 tablet (600 mg total) by mouth 2 (two) times daily. Patient not taking: Reported on 11/25/2016 11/04/16   Filbert Schilder, MD  ipratropium-albuterol (DUONEB) 0.5-2.5 (3) MG/3ML SOLN Take 3 mLs by nebulization every 2 (two) hours as needed. Patient not taking: Reported on 11/25/2016 11/04/16   Filbert Schilder, MD  metoprolol succinate (TOPROL-XL) 25 MG 24 hr tablet Take 0.5 tablets (12.5 mg total) by mouth daily. 11/05/16   Filbert Schilder, MD  Multiple Vitamin (MULTIVITAMIN WITH MINERALS) TABS tablet Take 1 tablet by mouth daily.    Historical Provider, MD  potassium chloride SA (K-DUR,KLOR-CON) 20 MEQ tablet Take 1 tablet (20 mEq total) by mouth daily. Patient taking differently: Take 20 mEq by mouth 2 (two) times daily.  10/25/15   Elease Etienne, MD  pravastatin (PRAVACHOL) 10 MG tablet Take 10 mg by mouth daily.    Historical Provider, MD  thiamine 100 MG tablet Take 1 tablet (100 mg total) by mouth daily. 11/05/16   Filbert Schilder, MD  warfarin (COUMADIN) 5 MG tablet Take 0.5 tablets (2.5 mg total) by mouth daily at 6 PM. Patient taking differently: Take 2.5-5 mg by mouth as directed. Take 1 tablet (5 mg) on Sun, Tues, Thurs, Sat and Take 0.5 tablet (2.5 mg) on MWF 10/26/15   Elease Etienne, MD    Family History Family History  Problem Relation Age of Onset  . COPD Mother   . Hypertension Father     Social History Social History  Substance Use Topics  . Smoking status: Former Smoker    Types: Cigarettes    Start date: 06/29/1987  . Smokeless tobacco: Never Used     Comment: stopped in 1985  . Alcohol use 6.0 oz/week    10 Cans of beer per week     Comment: 03/04/16 - "I have not been drunk in 20 years. I can take it or leave it. "     Allergies   Patient has no known allergies.   Review of Systems Review of Systems  All other systems reviewed and are negative.    Physical  Exam Updated Vital Signs BP 107/69 (BP Location: Right Arm)   Pulse 88   Temp 98.1 F (36.7 C) (Oral)   Resp 20   SpO2 99%   Physical Exam  Constitutional: He is oriented to person, place, and time.  In no acute distress  HENT:  Head: Normocephalic and atraumatic.  Eyes: Conjunctivae are normal.  Neck: Neck supple.  Cardiovascular: Normal rate and regular rhythm.   Pulmonary/Chest: Effort normal and breath sounds normal.  Abdominal: Soft. Bowel sounds are normal.  Musculoskeletal: Normal range of motion.  Neurological: He is alert and oriented to person, place, and time.  Skin:  Left forearm: Small skin tear on proximal medial forearm.  Right calf: 2 x 2  CM chronic skin ulcer  Psychiatric: He has a normal mood and affect. His behavior is normal.  Nursing note and vitals reviewed.    ED Treatments / Results  Labs (all labs ordered are listed, but only abnormal results are displayed) Labs Reviewed - No data to display  EKG  EKG Interpretation None       Radiology No results found.  Procedures Procedures (including critical care time)  Medications Ordered in ED Medications - No data to display   Initial Impression / Assessment and Plan / ED Course  I have reviewed the triage vital signs and the nursing notes.  Pertinent labs & imaging results that were available during my care of the patient were reviewed by me and considered in my medical decision making (see chart for details).     Patient is alert and oriented 3 without neurological deficits. No head or neck trauma. No bony tenderness. No x-rays necessary.  Final Clinical Impressions(s) / ED Diagnoses   Final diagnoses:  Fall, initial encounter  Skin tear of left forearm without complication, initial encounter  Chronic skin ulcer, limited to breakdown of skin [facility])    New Prescriptions New Prescriptions   No medications on file     Donnetta Hutching, MD 01/30/17 [phone]

## 2017-01-30 NOTE — Discharge Instructions (Signed)
Recommend follow-up at wound care center for ulcer on your right leg. Simple dressing to left forearm.

## 2017-02-04 ENCOUNTER — Emergency Department (HOSPITAL_COMMUNITY): Payer: Medicare HMO

## 2017-02-04 ENCOUNTER — Encounter (HOSPITAL_COMMUNITY): Payer: Self-pay | Admitting: Emergency Medicine

## 2017-02-04 ENCOUNTER — Inpatient Hospital Stay (HOSPITAL_COMMUNITY)
Admission: EM | Admit: 2017-02-04 | Discharge: 2017-02-10 | DRG: 553 | Disposition: A | Discharge status: Hospice | Payer: Medicare HMO | Attending: Internal Medicine | Admitting: Internal Medicine

## 2017-02-04 DIAGNOSIS — E440 Moderate protein-calorie malnutrition: Secondary | ICD-10-CM | POA: Insufficient documentation

## 2017-02-04 DIAGNOSIS — F1010 Alcohol abuse, uncomplicated: Secondary | ICD-10-CM | POA: Diagnosis present

## 2017-02-04 DIAGNOSIS — K7031 Alcoholic cirrhosis of liver with ascites: Secondary | ICD-10-CM | POA: Diagnosis present

## 2017-02-04 DIAGNOSIS — N289 Disorder of kidney and ureter, unspecified: Secondary | ICD-10-CM | POA: Diagnosis not present

## 2017-02-04 DIAGNOSIS — B9562 Methicillin resistant Staphylococcus aureus infection as the cause of diseases classified elsewhere: Secondary | ICD-10-CM | POA: Diagnosis present

## 2017-02-04 DIAGNOSIS — K219 Gastro-esophageal reflux disease without esophagitis: Secondary | ICD-10-CM | POA: Diagnosis present

## 2017-02-04 DIAGNOSIS — R7881 Bacteremia: Secondary | ICD-10-CM

## 2017-02-04 DIAGNOSIS — Y929 Unspecified place or not applicable: Secondary | ICD-10-CM | POA: Diagnosis not present

## 2017-02-04 DIAGNOSIS — N390 Urinary tract infection, site not specified: Secondary | ICD-10-CM | POA: Diagnosis present

## 2017-02-04 DIAGNOSIS — L8990 Pressure ulcer of unspecified site, unspecified stage: Secondary | ICD-10-CM | POA: Insufficient documentation

## 2017-02-04 DIAGNOSIS — K117 Disturbances of salivary secretion: Secondary | ICD-10-CM

## 2017-02-04 DIAGNOSIS — I429 Cardiomyopathy, unspecified: Secondary | ICD-10-CM | POA: Diagnosis present

## 2017-02-04 DIAGNOSIS — L89891 Pressure ulcer of other site, stage 1: Secondary | ICD-10-CM | POA: Diagnosis present

## 2017-02-04 DIAGNOSIS — E785 Hyperlipidemia, unspecified: Secondary | ICD-10-CM | POA: Diagnosis present

## 2017-02-04 DIAGNOSIS — Z8249 Family history of ischemic heart disease and other diseases of the circulatory system: Secondary | ICD-10-CM

## 2017-02-04 DIAGNOSIS — X58XXXA Exposure to other specified factors, initial encounter: Secondary | ICD-10-CM | POA: Diagnosis present

## 2017-02-04 DIAGNOSIS — R627 Adult failure to thrive: Secondary | ICD-10-CM | POA: Diagnosis not present

## 2017-02-04 DIAGNOSIS — L97522 Non-pressure chronic ulcer of other part of left foot with fat layer exposed: Secondary | ICD-10-CM

## 2017-02-04 DIAGNOSIS — I480 Paroxysmal atrial fibrillation: Secondary | ICD-10-CM | POA: Diagnosis present

## 2017-02-04 DIAGNOSIS — S41111A Laceration without foreign body of right upper arm, initial encounter: Secondary | ICD-10-CM | POA: Diagnosis present

## 2017-02-04 DIAGNOSIS — M10031 Idiopathic gout, right wrist: Secondary | ICD-10-CM | POA: Diagnosis not present

## 2017-02-04 DIAGNOSIS — K560 Paralytic ileus: Secondary | ICD-10-CM | POA: Diagnosis not present

## 2017-02-04 DIAGNOSIS — B029 Zoster without complications: Secondary | ICD-10-CM | POA: Diagnosis present

## 2017-02-04 DIAGNOSIS — R0609 Other forms of dyspnea: Secondary | ICD-10-CM | POA: Diagnosis not present

## 2017-02-04 DIAGNOSIS — I878 Other specified disorders of veins: Secondary | ICD-10-CM | POA: Diagnosis present

## 2017-02-04 DIAGNOSIS — I130 Hypertensive heart and chronic kidney disease with heart failure and stage 1 through stage 4 chronic kidney disease, or unspecified chronic kidney disease: Secondary | ICD-10-CM | POA: Diagnosis present

## 2017-02-04 DIAGNOSIS — N183 Chronic kidney disease, stage 3 (moderate): Secondary | ICD-10-CM | POA: Diagnosis present

## 2017-02-04 DIAGNOSIS — E669 Obesity, unspecified: Secondary | ICD-10-CM | POA: Diagnosis present

## 2017-02-04 DIAGNOSIS — M1000 Idiopathic gout, unspecified site: Secondary | ICD-10-CM

## 2017-02-04 DIAGNOSIS — M109 Gout, unspecified: Secondary | ICD-10-CM | POA: Diagnosis present

## 2017-02-04 DIAGNOSIS — B372 Candidiasis of skin and nail: Secondary | ICD-10-CM | POA: Diagnosis present

## 2017-02-04 DIAGNOSIS — L03113 Cellulitis of right upper limb: Secondary | ICD-10-CM | POA: Diagnosis not present

## 2017-02-04 DIAGNOSIS — R21 Rash and other nonspecific skin eruption: Secondary | ICD-10-CM

## 2017-02-04 DIAGNOSIS — J9601 Acute respiratory failure with hypoxia: Secondary | ICD-10-CM | POA: Diagnosis present

## 2017-02-04 DIAGNOSIS — I482 Chronic atrial fibrillation: Secondary | ICD-10-CM | POA: Diagnosis present

## 2017-02-04 DIAGNOSIS — M10041 Idiopathic gout, right hand: Principal | ICD-10-CM | POA: Diagnosis present

## 2017-02-04 DIAGNOSIS — N179 Acute kidney failure, unspecified: Secondary | ICD-10-CM | POA: Diagnosis present

## 2017-02-04 DIAGNOSIS — Z7901 Long term (current) use of anticoagulants: Secondary | ICD-10-CM

## 2017-02-04 DIAGNOSIS — D638 Anemia in other chronic diseases classified elsewhere: Secondary | ICD-10-CM | POA: Diagnosis present

## 2017-02-04 DIAGNOSIS — I509 Heart failure, unspecified: Secondary | ICD-10-CM | POA: Diagnosis not present

## 2017-02-04 DIAGNOSIS — J690 Pneumonitis due to inhalation of food and vomit: Secondary | ICD-10-CM | POA: Diagnosis present

## 2017-02-04 DIAGNOSIS — Z66 Do not resuscitate: Secondary | ICD-10-CM

## 2017-02-04 DIAGNOSIS — Z79899 Other long term (current) drug therapy: Secondary | ICD-10-CM

## 2017-02-04 DIAGNOSIS — B351 Tinea unguium: Secondary | ICD-10-CM | POA: Diagnosis not present

## 2017-02-04 DIAGNOSIS — I5033 Acute on chronic diastolic (congestive) heart failure: Secondary | ICD-10-CM | POA: Diagnosis present

## 2017-02-04 DIAGNOSIS — N189 Chronic kidney disease, unspecified: Secondary | ICD-10-CM | POA: Diagnosis not present

## 2017-02-04 DIAGNOSIS — Z6838 Body mass index (BMI) 38.0-38.9, adult: Secondary | ICD-10-CM

## 2017-02-04 DIAGNOSIS — D72829 Elevated white blood cell count, unspecified: Secondary | ICD-10-CM | POA: Diagnosis not present

## 2017-02-04 DIAGNOSIS — R52 Pain, unspecified: Secondary | ICD-10-CM | POA: Diagnosis not present

## 2017-02-04 DIAGNOSIS — Z5181 Encounter for therapeutic drug level monitoring: Secondary | ICD-10-CM | POA: Diagnosis not present

## 2017-02-04 DIAGNOSIS — Z7951 Long term (current) use of inhaled steroids: Secondary | ICD-10-CM

## 2017-02-04 DIAGNOSIS — R112 Nausea with vomiting, unspecified: Secondary | ICD-10-CM

## 2017-02-04 DIAGNOSIS — R0600 Dyspnea, unspecified: Secondary | ICD-10-CM

## 2017-02-04 DIAGNOSIS — L97529 Non-pressure chronic ulcer of other part of left foot with unspecified severity: Secondary | ICD-10-CM | POA: Diagnosis not present

## 2017-02-04 DIAGNOSIS — Z87891 Personal history of nicotine dependence: Secondary | ICD-10-CM

## 2017-02-04 DIAGNOSIS — Z515 Encounter for palliative care: Secondary | ICD-10-CM | POA: Diagnosis not present

## 2017-02-04 DIAGNOSIS — R7981 Abnormal blood-gas level: Secondary | ICD-10-CM

## 2017-02-04 DIAGNOSIS — Z825 Family history of asthma and other chronic lower respiratory diseases: Secondary | ICD-10-CM

## 2017-02-04 HISTORY — DX: Chronic kidney disease, unspecified: N18.9

## 2017-02-04 HISTORY — DX: Hyperlipidemia, unspecified: E78.5

## 2017-02-04 HISTORY — DX: Gout, unspecified: M10.9

## 2017-02-04 HISTORY — DX: Permanent atrial fibrillation: I48.21

## 2017-02-04 HISTORY — DX: Disorder of kidney and ureter, unspecified: N28.9

## 2017-02-04 LAB — BASIC METABOLIC PANEL
ANION GAP: 12 (ref 5–15)
BUN: 55 mg/dL — ABNORMAL HIGH (ref 6–20)
CALCIUM: 8.4 mg/dL — AB (ref 8.9–10.3)
CO2: 23 mmol/L (ref 22–32)
CREATININE: 1.72 mg/dL — AB (ref 0.61–1.24)
Chloride: 106 mmol/L (ref 101–111)
GFR, EST AFRICAN AMERICAN: 45 mL/min — AB (ref >60)
GFR, EST NON AFRICAN AMERICAN: 39 mL/min — AB (ref >60)
GLUCOSE: 70 mg/dL (ref 65–99)
Potassium: 3.7 mmol/L (ref 3.5–5.1)
Sodium: 141 mmol/L (ref 135–145)

## 2017-02-04 LAB — URINALYSIS, ROUTINE W REFLEX MICROSCOPIC
Bilirubin Urine: NEGATIVE
Glucose, UA: NEGATIVE mg/dL
Ketones, ur: NEGATIVE mg/dL
Nitrite: NEGATIVE
PROTEIN: 100 mg/dL — AB
SPECIFIC GRAVITY, URINE: 1.016 (ref 1.005–1.030)
pH: 9 — ABNORMAL HIGH (ref 5.0–8.0)

## 2017-02-04 LAB — PROTIME-INR
INR: 4.65 — AB
PROTHROMBIN TIME: 45.1 s — AB (ref 11.4–15.2)

## 2017-02-04 LAB — CBC WITH DIFFERENTIAL/PLATELET
BASOS ABS: 0 10*3/uL (ref 0.0–0.1)
BASOS PCT: 0 %
EOS ABS: 0 10*3/uL (ref 0.0–0.7)
Eosinophils Relative: 0 %
HCT: 30.2 % — ABNORMAL LOW (ref 39.0–52.0)
Hemoglobin: 10 g/dL — ABNORMAL LOW (ref 13.0–17.0)
Lymphocytes Relative: 7 %
Lymphs Abs: 0.8 10*3/uL (ref 0.7–4.0)
MCH: 33.8 pg (ref 26.0–34.0)
MCHC: 33.1 g/dL (ref 30.0–36.0)
MCV: 102 fL — ABNORMAL HIGH (ref 78.0–100.0)
MONO ABS: 0.6 10*3/uL (ref 0.1–1.0)
Monocytes Relative: 5 %
NEUTROS ABS: 10.6 10*3/uL — AB (ref 1.7–7.7)
NEUTROS PCT: 88 %
PLATELETS: 209 10*3/uL (ref 150–400)
RBC: 2.96 MIL/uL — ABNORMAL LOW (ref 4.22–5.81)
RDW: 17.2 % — AB (ref 11.5–15.5)
WBC: 12 10*3/uL — ABNORMAL HIGH (ref 4.0–10.5)

## 2017-02-04 LAB — TSH: TSH: 1.157 u[IU]/mL (ref 0.350–4.500)

## 2017-02-04 MED ORDER — ACYCLOVIR 200 MG PO CAPS
800.0000 mg | ORAL_CAPSULE | Freq: Every day | ORAL | Status: DC
  Administered 2017-02-04 (×4): 800 mg via ORAL
  Filled 2017-02-04 (×6): qty 4

## 2017-02-04 MED ORDER — VANCOMYCIN HCL IN DEXTROSE 1-5 GM/200ML-% IV SOLN
1000.0000 mg | Freq: Once | INTRAVENOUS | Status: AC
  Administered 2017-02-04: 1000 mg via INTRAVENOUS
  Filled 2017-02-04: qty 200

## 2017-02-04 MED ORDER — FOLIC ACID 1 MG PO TABS
1.0000 mg | ORAL_TABLET | Freq: Every day | ORAL | Status: DC
  Administered 2017-02-04 – 2017-02-06 (×3): 1 mg via ORAL
  Filled 2017-02-04 (×3): qty 1

## 2017-02-04 MED ORDER — ALLOPURINOL 100 MG PO TABS
100.0000 mg | ORAL_TABLET | Freq: Two times a day (BID) | ORAL | Status: DC
  Administered 2017-02-04 – 2017-02-06 (×5): 100 mg via ORAL
  Filled 2017-02-04 (×5): qty 1

## 2017-02-04 MED ORDER — OXYCODONE-ACETAMINOPHEN 5-325 MG PO TABS
1.0000 | ORAL_TABLET | Freq: Once | ORAL | Status: AC
  Administered 2017-02-04: 1 via ORAL
  Filled 2017-02-04: qty 1

## 2017-02-04 MED ORDER — WARFARIN - PHARMACIST DOSING INPATIENT
Freq: Every day | Status: DC
Start: 2017-02-04 — End: 2017-02-09

## 2017-02-04 MED ORDER — ADULT MULTIVITAMIN W/MINERALS CH
1.0000 | ORAL_TABLET | Freq: Every day | ORAL | Status: DC
  Administered 2017-02-04 – 2017-02-06 (×3): 1 via ORAL
  Filled 2017-02-04 (×3): qty 1

## 2017-02-04 MED ORDER — PIPERACILLIN-TAZOBACTAM 3.375 G IVPB 30 MIN
3.3750 g | Freq: Once | INTRAVENOUS | Status: AC
  Administered 2017-02-04: 3.375 g via INTRAVENOUS
  Filled 2017-02-04: qty 50

## 2017-02-04 MED ORDER — ENSURE ENLIVE PO LIQD
237.0000 mL | Freq: Two times a day (BID) | ORAL | Status: DC
  Administered 2017-02-04 – 2017-02-05 (×3): 237 mL via ORAL

## 2017-02-04 MED ORDER — SODIUM CHLORIDE 0.9 % IV SOLN
INTRAVENOUS | Status: DC
  Administered 2017-02-04 – 2017-02-06 (×2): via INTRAVENOUS

## 2017-02-04 MED ORDER — VITAMIN B-1 100 MG PO TABS
100.0000 mg | ORAL_TABLET | Freq: Every day | ORAL | Status: DC
  Administered 2017-02-04 – 2017-02-06 (×3): 100 mg via ORAL
  Filled 2017-02-04 (×3): qty 1

## 2017-02-04 MED ORDER — FUROSEMIDE 40 MG PO TABS
40.0000 mg | ORAL_TABLET | Freq: Every day | ORAL | Status: DC
  Administered 2017-02-04: 40 mg via ORAL
  Filled 2017-02-04: qty 1

## 2017-02-04 MED ORDER — COLCHICINE 0.6 MG PO TABS
1.2000 mg | ORAL_TABLET | Freq: Once | ORAL | Status: AC
  Administered 2017-02-04: 1.2 mg via ORAL
  Filled 2017-02-04: qty 2

## 2017-02-04 MED ORDER — ACETAMINOPHEN 325 MG PO TABS
650.0000 mg | ORAL_TABLET | Freq: Four times a day (QID) | ORAL | Status: DC | PRN
  Administered 2017-02-08 (×2): 650 mg via ORAL
  Filled 2017-02-04 (×2): qty 2

## 2017-02-04 MED ORDER — PRAVASTATIN SODIUM 20 MG PO TABS
10.0000 mg | ORAL_TABLET | Freq: Every day | ORAL | Status: DC
  Administered 2017-02-04 – 2017-02-06 (×3): 10 mg via ORAL
  Filled 2017-02-04 (×3): qty 1

## 2017-02-04 MED ORDER — ACETAMINOPHEN 650 MG RE SUPP: 650.0000 mg | Freq: Four times a day (QID) | RECTAL | Status: DC | PRN

## 2017-02-04 MED ORDER — COLCHICINE 0.6 MG PO TABS: 0.6000 mg | ORAL_TABLET | Freq: Two times a day (BID) | ORAL | Status: DC

## 2017-02-04 MED ORDER — CLINDAMYCIN PHOSPHATE 600 MG/50ML IV SOLN
600.0000 mg | Freq: Three times a day (TID) | INTRAVENOUS | Status: DC
  Administered 2017-02-04 – 2017-02-05 (×5): 600 mg via INTRAVENOUS
  Filled 2017-02-04 (×5): qty 50

## 2017-02-04 MED ORDER — SODIUM CHLORIDE 0.9 % IV BOLUS (SEPSIS)
500.0000 mL | Freq: Once | INTRAVENOUS | Status: AC
  Administered 2017-02-04: 500 mL via INTRAVENOUS

## 2017-02-04 MED ORDER — COLCHICINE 0.6 MG PO TABS
0.6000 mg | ORAL_TABLET | Freq: Every day | ORAL | Status: DC
  Administered 2017-02-05 – 2017-02-06 (×2): 0.6 mg via ORAL
  Filled 2017-02-04 (×2): qty 1

## 2017-02-04 MED ORDER — ONDANSETRON HCL 4 MG/2ML IJ SOLN
4.0000 mg | Freq: Four times a day (QID) | INTRAMUSCULAR | Status: DC | PRN
  Administered 2017-02-06 – 2017-02-08 (×4): 4 mg via INTRAVENOUS
  Filled 2017-02-04 (×5): qty 2

## 2017-02-04 MED ORDER — ONDANSETRON HCL 4 MG PO TABS: 4.0000 mg | ORAL_TABLET | Freq: Four times a day (QID) | ORAL | Status: DC | PRN

## 2017-02-04 MED ORDER — HYDROCODONE-ACETAMINOPHEN 5-325 MG PO TABS
1.0000 | ORAL_TABLET | ORAL | Status: DC | PRN
  Administered 2017-02-04 – 2017-02-07 (×11): 2 via ORAL
  Filled 2017-02-04 (×11): qty 2

## 2017-02-04 MED ORDER — SODIUM CHLORIDE 0.9% FLUSH
3.0000 mL | Freq: Two times a day (BID) | INTRAVENOUS | Status: DC
  Administered 2017-02-04 – 2017-02-10 (×9): 3 mL via INTRAVENOUS

## 2017-02-04 MED ORDER — POTASSIUM CHLORIDE CRYS ER 20 MEQ PO TBCR
20.0000 meq | EXTENDED_RELEASE_TABLET | Freq: Two times a day (BID) | ORAL | Status: DC
  Administered 2017-02-04: 20 meq via ORAL
  Filled 2017-02-04 (×2): qty 1

## 2017-02-04 NOTE — ED Provider Notes (Signed)
[facility]-EMERGENCY DEPT Provider Note   CSN: [id number] Arrival date & time: 02/04/17  5784     History   Chief Complaint Chief Complaint  Patient presents with  . Gout    Right Wrist    HPI LIBORIO SACCENTE is a 71 y.o. male.  MIKI BLANK is a 71 y.o. Male with a history of gout complaining of right wrist pain for the past 2-1/2 days. Patient reports this feels like his gout. He reports he was in his bilateral wrists and now is only in his right wrist. He tells me he is taken care of by his father at home. He notes no wound to his left foot. He denies any foot pain. Patient also is noted to have a ulcer to his left posterior leg as well as a skin tear to his right arm. Nursing staff report he was wet with urine on arrival. Patient's only complaint currently is his right wrist pain. He reports a rash to his upper chest for about a week that is not painful. He denies fevers, abdominal pain, nausea, diarrhea, difficulty urinating, foot pain, leg pain.    The history is provided by the patient and medical records. No language interpreter was used.    Past Medical History:  Diagnosis Date  . Alcoholism (HCC)   . Anemia   . Atrial fibrillation or flutter   . Cardiomyopathy    nonischemic  . CHF (congestive heart failure) (HCC)    ejection fractio of 45% per echo in 2011 (previos EF 35-40% in 2003), echo 04/2013  -LVEF 50-55%.  . Complication of anesthesia    April 11, 2013  . GERD (gastroesophageal reflux disease)    takes tums prn  . Gout   . Hyperlipidemia   . Hypertension   . Hypoxemia   . Obesity   . Ventricular tachycardia (HCC)    nonsustained, asymptomatic    Patient Active Problem List   Diagnosis Date Noted  . Gout flare 02/04/2017  . Hypertensive heart disease with heart failure (HCC)   . Acute on chronic diastolic heart failure (HCC)   . V-tach (HCC) 10/31/2016  . CHF exacerbation (HCC) 10/30/2016  . CHF (congestive heart failure) (HCC) 10/30/2016  .  Bradycardia 03/16/2016  . Alcohol abuse 03/16/2016  . Acute encephalopathy 03/16/2016  . Alcohol withdrawal delirium (HCC) 03/16/2016  . Cor, pulmonale, acute (HCC)   . CAP (community acquired pneumonia) 03/06/2016  . Weakness of both lower extremities 03/04/2016  . Morbid obesity - BMI 45 10/23/2015  . Current use of long term anticoagulation 10/23/2015  . 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 Diastolic dysfunction with acute on chronic heart failure (HCC) 10/23/2015  . 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 Recurrent falls 10/20/2015  . Physical deconditioning 10/20/2015  . Hyperkalemia 10/20/2015  . Fall   . Streptococcus viridans infection 02/13/2015  . Sepsis (HCC) 02/07/2015  . Anemia of chronic disease 02/07/2015  . Alcoholic hepatitis 02/07/2015  . Thrombocytopenia (HCC) 02/07/2015  . Elevated troponin 02/06/2015  . Hematoma 04/24/2013  . Chronic atrial fibrillation (HCC) 04/24/2013  . Acute respiratory failure with hypoxia (HCC) 04/23/2013  . Cellulitis 04/23/2013  . Acute on chronic systolic congestive heart failure (HCC) 04/22/2013  . Essential hypertension 12/06/2009    Past Surgical History:  Procedure Laterality Date  . I&D EXTREMITY Right 06/28/2013   Procedure: IRRIGATION AND DEBRIDEMENT OF RIGHT LEG WITH SURGICAL PREP/PLACEMENT OF ACELL ;  Surgeon: Wayland Denis, DO;  Location: [facility]  OR;  Service: Government social research officer;  Laterality: Right;  . KNEE BURSECTOMY Right 04/20/2013   Procedure: Right knee prepatella bursa decompression;  Surgeon: Cammy Copa, MD;  Location: [facility] OR;  Service: Orthopedics;  Laterality: Right;  . TONSILLECTOMY         Home Medications    Prior to Admission medications   Medication Sig Start Date End Date Taking? Authorizing Provider  allopurinol (ZYLOPRIM) 100 MG tablet Take 100 mg by mouth 2 (two) times daily.  01/14/16  Yes Historical Provider, MD  folic acid (FOLVITE) 1 MG tablet Take 1 tablet (1 mg total) by mouth daily. 11/05/16  Yes  Filbert Schilder, MD  furosemide (LASIX) 40 MG tablet Take 40 mg by mouth 2 (two) times daily. 01/13/17  Yes Historical Provider, MD  metoprolol succinate (TOPROL-XL) 25 MG 24 hr tablet Take 0.5 tablets (12.5 mg total) by mouth daily. 11/05/16  Yes Filbert Schilder, MD  Multiple Vitamin (MULTIVITAMIN WITH MINERALS) TABS tablet Take 1 tablet by mouth daily.   Yes Historical Provider, MD  potassium chloride SA (K-DUR,KLOR-CON) 20 MEQ tablet Take 1 tablet (20 mEq total) by mouth daily. Patient taking differently: Take 20 mEq by mouth 2 (two) times daily.  10/25/15  Yes Elease Etienne, MD  pravastatin (PRAVACHOL) 10 MG tablet Take 10 mg by mouth daily.   Yes Historical Provider, MD  thiamine 100 MG tablet Take 1 tablet (100 mg total) by mouth daily. 11/05/16  Yes Filbert Schilder, MD  warfarin (COUMADIN) 5 MG tablet Take 0.5 tablets (2.5 mg total) by mouth daily at 6 PM. Patient taking differently: Take 2.5-5 mg by mouth as directed. Take 1 tablet (5 mg) on Sun, Tues, Thurs, Sat and Take 0.5 tablet (2.5 mg) on MWF 10/26/15  Yes Elease Etienne, MD  acetaminophen (TYLENOL) 325 MG tablet Take 2 tablets (650 mg total) by mouth every 6 (six) hours as needed for mild pain, moderate pain, fever or headache (or Fever >/= 101). Patient not taking: Reported on 02/04/2017 10/25/15   Elease Etienne, MD  Amino Acids-Protein Hydrolys (FEEDING SUPPLEMENT, PRO-STAT SUGAR FREE 64,) LIQD Take 30 mLs by mouth 2 (two) times daily. Patient not taking: Reported on 08/27/2016 10/25/15   Elease Etienne, MD  feeding supplement, ENSURE ENLIVE, (ENSURE ENLIVE) LIQD Take 237 mLs by mouth 2 (two) times daily between meals. 10/25/15   Elease Etienne, MD  furosemide (LASIX) 80 MG tablet Take 1 tablet (80 mg total) by mouth 2 (two) times daily. Patient not taking: Reported on 02/04/2017 11/04/16   Filbert Schilder, MD  guaiFENesin (MUCINEX) 600 MG 12 hr tablet Take 1 tablet (600 mg total) by mouth 2 (two) times  daily. Patient not taking: Reported on 11/25/2016 11/04/16   Filbert Schilder, MD  ipratropium-albuterol (DUONEB) 0.5-2.5 (3) MG/3ML SOLN Take 3 mLs by nebulization every 2 (two) hours as needed. Patient not taking: Reported on 11/25/2016 11/04/16   Filbert Schilder, MD    Family History Family History  Problem Relation Age of Onset  . COPD Mother   . Hypertension Father     Social History Social History  Substance Use Topics  . Smoking status: Former Smoker    Types: Cigarettes    Start date: 06/29/1987  . Smokeless tobacco: Never Used     Comment: stopped in 1985  . Alcohol use 6.0 oz/week    10 Cans of beer per week     Comment: 03/04/16 - "I have not been  drunk in 20 years. I can take it or leave it. "     Allergies   Patient has no known allergies.   Review of Systems Review of Systems  Constitutional: Negative for chills and fever.  HENT: Negative for congestion and sore throat.   Eyes: Negative for visual disturbance.  Respiratory: Negative for cough and shortness of breath.   Cardiovascular: Negative for chest pain.  Gastrointestinal: Negative for abdominal pain, diarrhea, nausea and vomiting.  Genitourinary: Negative for dysuria.  Musculoskeletal: Positive for arthralgias and joint swelling. Negative for back pain and neck pain.  Skin: Positive for color change, rash and wound.  Neurological: Negative for headaches.     Physical Exam Updated Vital Signs BP 115/68   Pulse 81   Temp 98.6 F (37 C) (Oral)   Resp 15   SpO2 91%   Physical Exam  Constitutional: He is oriented to person, place, and time. He appears well-developed and well-nourished. No distress.  Disheveled and slightly malodorous. Nontoxic-appearing.  HENT:  Head: Normocephalic and atraumatic.  Mouth/Throat: Oropharynx is clear and moist.  Dental caries.  Eyes: Conjunctivae are normal. Pupils are equal, round, and reactive to light. Right eye exhibits no discharge. Left eye exhibits  no discharge.  Neck: Neck supple.  Cardiovascular: Normal rate, regular rhythm, normal heart sounds and intact distal pulses.  Exam reveals no gallop and no friction rub.   No murmur heard. Pulmonary/Chest: Effort normal and breath sounds normal. No respiratory distress. He has no wheezes. He has no rales.  Abdominal: Soft. There is no tenderness. There is no guarding.  Genitourinary:  Genitourinary Comments: Yeast infection to his groin.   Musculoskeletal: He exhibits edema and tenderness.  Mild bilateral LE edema with erythema. Stage one ulcer about 2 cm in size to his right posterior lower leg.  Necrotic tissue and discharge noted between left toes with malodor.  Redness and warmth noted to right wrist with TTP. Good right wrist TTP.   Lymphadenopathy:    He has no cervical adenopathy.  Neurological: He is alert and oriented to person, place, and time. No sensory deficit. He exhibits normal muscle tone. Coordination normal.  Skin: Skin is warm and dry. Capillary refill takes less than 2 seconds. Rash noted. He is not diaphoretic. There is erythema. No pallor.  Vesicular, non-painful rash noted to his right upper chest that does not cross the midline.  Psychiatric: He has a normal mood and affect. His behavior is normal.  Nursing note and vitals reviewed.    ED Treatments / Results  Labs (all labs ordered are listed, but only abnormal results are displayed) Labs Reviewed  BASIC METABOLIC PANEL - Abnormal; Notable for the following:       Result Value   BUN 55 (*)    Creatinine, Ser 1.72 (*)    Calcium 8.4 (*)    GFR calc non Af Amer 39 (*)    GFR calc Af Amer 45 (*)    All other components within normal limits  CBC WITH DIFFERENTIAL/PLATELET - Abnormal; Notable for the following:    WBC 12.0 (*)    RBC 2.96 (*)    Hemoglobin 10.0 (*)    HCT 30.2 (*)    MCV 102.0 (*)    RDW 17.2 (*)    Neutro Abs 10.6 (*)    All other components within normal limits  PROTIME-INR -  Abnormal; Notable for the following:    Prothrombin Time 45.1 (*)    INR 4.65 (*)  All other components within normal limits  CULTURE, BLOOD (ROUTINE X 2)  CULTURE, BLOOD (ROUTINE X 2)  URINALYSIS, ROUTINE W REFLEX MICROSCOPIC    EKG  EKG Interpretation None       Radiology Dg Wrist Complete Right  Result Date: 02/04/2017 CLINICAL DATA:  History of gout, now with soft tissue swelling about the wrist. No known injury. EXAM: RIGHT WRIST - COMPLETE 3+ VIEW COMPARISON:  04/23/2013 FINDINGS: There is diffuse soft tissue swelling about the wrist. No associated fracture or dislocation. There is widening of the scapholunate articulation with associated loss of the ulnar carpal joint space. Moderate degenerative change involving the radiocarpal joint as well as the STT joints of the base of the thumb with joint space loss, subchondral sclerosis and osteophytosis. No definitive erosions. No evidence of chondrocalcinosis. Vascular calcifications. No definite displacement of the pronator quadratus fat pad. IMPRESSION: 1. Diffuse soft tissue swelling about the wrist without associated fracture or dislocation. 2. Degenerative change of the wrist and widening of the scapholunate articulation with associated loss of the ulnar carpal joint space, progressed compared to the 04/2013 examination. Further evaluation with wrist MRI could be performed as clinically indicated. Electronically Signed   By: Simonne Come M.D.   On: 02/04/2017 07:35   Dg Foot Complete Left  Result Date: 02/04/2017 CLINICAL DATA:  Foot wound/ulcer. EXAM: LEFT FOOT - COMPLETE 3+ VIEW COMPARISON:  None. FINDINGS: Advanced hallux valgus with cross toe deformity. Lateral digits show hammertoe deformity. This limits visualization of the first and second digits in the lateral projection. No convincing erosion; no suspected osteomyelitis. No fracture or traumatic malalignment. Osteopenia and arterial calcification. IMPRESSION: 1. No acute  finding. 2. 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 Lateral hammertoes. Electronically Signed   By: Marnee Spring M.D.   On: 02/04/2017 07:33    Procedures Procedures (including critical care time)  Medications Ordered in ED Medications  colchicine tablet 1.2 mg (not administered)  colchicine tablet 0.6 mg (not administered)  clindamycin (CLEOCIN) IVPB 600 mg (not administered)  acyclovir (ZOVIRAX) 200 MG capsule 800 mg (not administered)  oxyCODONE-acetaminophen (PERCOCET/ROXICET) 5-325 MG per tablet 1 tablet (1 tablet Oral Given 02/04/17 0728)  vancomycin (VANCOCIN) IVPB 1000 mg/200 mL premix (0 mg Intravenous Stopped 02/04/17 1022)  piperacillin-tazobactam (ZOSYN) IVPB 3.375 g (0 g Intravenous Stopped 02/04/17 0915)  sodium chloride 0.9 % bolus 500 mL (500 mLs Intravenous New Bag/Given 02/04/17 0937)  oxyCODONE-acetaminophen (PERCOCET/ROXICET) 5-325 MG per tablet 1 tablet (1 tablet Oral Given 02/04/17 5409)     Initial Impression / Assessment and Plan / ED Course  I have reviewed the triage vital signs and the nursing notes.  Pertinent labs & imaging results that were available during my care of the patient were reviewed by me and considered in my medical decision making (see chart for details).    This is a 71 y.o. Male with a history of gout complaining of right wrist pain for the past 2-1/2 days. Patient reports this feels like his gout. He reports he was in his bilateral wrists and now is only in his right wrist. He tells me he is taken care of by his father at home. He notes no wound to his left foot. He denies any foot pain. Patient also is noted to have a ulcer to his left posterior leg as well as a skin tear to his right arm. Nursing staff report he was wet with urine on arrival. Patient's only complaint currently is his right wrist pain.  He reports a rash to his upper chest for about a week that is not painful. On examination patient is afebrile nontoxic appearing. He is  a vesicular-like rash noted to his right upper chest wall. It does not cross the midline. It is nonpainful to touch. It appears to look somewhat like shingles. Patient denies any pain with palpation of the area. Is is a small stage I ulcer to his right posterior leg. He also has malodorous slightly draining area noted to his left toes. His right wrist is warm and tender to palpation. He has good range of motion of his right wrist. No evidence of septic joint.  BMP reveals an elevated creatinine with 1.72. Acute kidney injury. CBC is remarkable for a white count of 12,000. INR is elevated at 4.65.  After a discussion with my attending, patient started on Vancomycin and Zosyn.  X-ray of his left foot shows no acute findings. No evidence of osteomyelitis. X-ray of his right wrist shows diffuse soft tissue swelling without associated fracture dislocation. There are degenerative changes of the wrist noted. Plan for admission. Patient agrees with plan for admission.  I consulted with [facility] Dr. Elisabeth Pigeon who accepted the patient for admission.   This patient was discussed with and evaluated by Dr. Nicanor Alcon who agrees with assessment and plan.   Final Clinical Impressions(s) / ED Diagnoses   Final diagnoses:  Acute idiopathic gout of right wrist  AKI (acute kidney injury) (HCC)  Decubitus ulcer of lower extremity, stage 1  Rash and nonspecific skin eruption  Paroxysmal atrial fibrillation (HCC)  Anticoagulated on Coumadin  Candidal skin infection    New Prescriptions New Prescriptions   No medications on file     Everlene Farrier, PA-C 02/04/17 1042    April Palumbo, MD 02/04/17 2305    Cy Blamer, MD 02/04/17 2308

## 2017-02-04 NOTE — H&P (Addendum)
History and Physical    [name]:[id number] DOB: [birth year] DOA: 02/04/2017  Referring MD/NP/PA: PA Dansie   PCP: [facility]   Patient coming from: home  Chief Complaint: pain in the right hand   HPI: [name] is a 71 y.o. male with medical history significant for gout, chronic diastolic CHF, last 2-D echo in January 2018 showed ejection fraction 55% and grade 3 diastolic dysfunction, dyslipidemia, atrial fibrillation on anticoagulation with Coumadin. Patient presented to [facility] long with worsening pain and redness and swelling in the right hand and his wrist. He reports taking allopurinol as prescribed and has not had gout flareup in a while but over past 3 days he was trying to get up from the bed and he mostly uses a right hand and this time the pain was getting worse and he could not get himself up. Patient reports no fevers. No reports of chills. He does have a rash over his right shoulder and chest but reports no pain over that area. He is not sure for how long he has had about a rash that has been there for at least a week or so. No reports of cough or night sweats. No reports of abdominal pain, nausea or vomiting.   ED Course: In ED, patient was hemodynamically stable. White blood cell count was 12, hemoglobin 10, creatinine 1.72. His creatinine in May 2017 was as high as 1.31. INR was 4.65. X-ray of the right wrist showed diffuse swelling but no evidence of fracture. X-ray of the left foot was done because patient has a small ulceration right below the third metatarsal which is not open, has no active drainage. X-ray did not show any acute findings. Patient was admitted for management of gout flare, possible shingles and foot infection.   Review of Systems:  Constitutional: Negative for fever, chills, diaphoresis, activity change, appetite change and fatigue.  HENT: Negative for ear pain, nosebleeds, congestion, facial swelling,  rhinorrhea, neck pain, neck stiffness and ear discharge.   Eyes: Negative for pain, discharge, redness, itching and visual disturbance.  Respiratory: Negative for cough, choking, chest tightness, shortness of breath, wheezing and stridor.   Cardiovascular: Negative for chest pain, palpitations and leg swelling.  Gastrointestinal: Negative for abdominal distention.  Genitourinary: Negative for dysuria, urgency, frequency, hematuria, flank pain, decreased urine volume, difficulty urinating and dyspareunia.  Musculoskeletal: Positive for pain in the wrist and hand on the right side Neurological: Negative for dizziness, tremors, seizures, syncope, facial asymmetry, speech difficulty, weakness, light-headedness, numbness and headaches.  Hematological: Negative for adenopathy. Does not bruise/bleed easily.  Psychiatric/Behavioral: Negative for hallucinations, behavioral problems, confusion, dysphoric mood, decreased concentration and agitation.   Past Medical History:  Diagnosis Date  . Alcoholism (HCC)   . Anemia   . Atrial fibrillation or flutter   . Cardiomyopathy    nonischemic  . CHF (congestive heart failure) (HCC)    ejection fractio of 45% per echo in 2011 (previos EF 35-40% in 2003), echo 04/2013  -LVEF 50-55%.  . Complication of anesthesia    April 11, 2013  . GERD (gastroesophageal reflux disease)    takes tums prn  . Gout   . Hyperlipidemia   . Hypertension   . Hypoxemia   . Obesity   . Ventricular tachycardia (HCC)    nonsustained, asymptomatic    Past Surgical History:  Procedure Laterality Date  . I&D EXTREMITY Right 06/28/2013   Procedure: IRRIGATION AND DEBRIDEMENT OF RIGHT LEG WITH SURGICAL PREP/PLACEMENT  OF ACELL ;  Surgeon: Wayland Denis, DO;  Location: [facility] OR;  Service: Plastics;  Laterality: Right;  . KNEE BURSECTOMY Right 04/20/2013   Procedure: Right knee prepatella bursa decompression;  Surgeon: Cammy Copa, MD;  Location: [facility] OR;  Service: Orthopedics;   Laterality: Right;  . TONSILLECTOMY      Social history:  reports that he has quit smoking. His smoking use included Cigarettes. He started smoking about 29 years ago. He has never used smokeless tobacco. He reports that he drinks about 6.0 oz of alcohol per week . He reports that he does not use drugs.  Ambulation: Patient reports ambulating without assistance at baseline  No Known Allergies  Family History  Problem Relation Age of Onset  . COPD Mother   . Hypertension Father     Prior to Admission medications   Medication Sig Start Date End Date Taking? Authorizing Provider  allopurinol (ZYLOPRIM) 100 MG tablet Take 100 mg by mouth 2 (two) times daily.  01/14/16  Yes Historical Provider, MD  folic acid (FOLVITE) 1 MG tablet Take 1 tablet (1 mg total) by mouth daily. 11/05/16  Yes Filbert Schilder, MD  furosemide (LASIX) 40 MG tablet Take 40 mg by mouth 2 (two) times daily. 01/13/17  Yes Historical Provider, MD  metoprolol succinate (TOPROL-XL) 25 MG 24 hr tablet Take 0.5 tablets (12.5 mg total) by mouth daily. 11/05/16  Yes Filbert Schilder, MD  Multiple Vitamin (MULTIVITAMIN WITH MINERALS) TABS tablet Take 1 tablet by mouth daily.   Yes Historical Provider, MD  potassium chloride SA (K-DUR,KLOR-CON) 20 MEQ tablet Take 1 tablet (20 mEq total) by mouth daily. Patient taking differently: Take 20 mEq by mouth 2 (two) times daily.  10/25/15  Yes Elease Etienne, MD  pravastatin (PRAVACHOL) 10 MG tablet Take 10 mg by mouth daily.   Yes Historical Provider, MD  thiamine 100 MG tablet Take 1 tablet (100 mg total) by mouth daily. 11/05/16  Yes Filbert Schilder, MD  warfarin (COUMADIN) 5 MG tablet Take 0.5 tablets (2.5 mg total) by mouth daily at 6 PM. Patient taking differently: Take 2.5-5 mg by mouth as directed. Take 1 tablet (5 mg) on Sun, Tues, Thurs, Sat and Take 0.5 tablet (2.5 mg) on MWF 10/26/15  Yes Elease Etienne, MD  acetaminophen (TYLENOL) 325 MG tablet Take 2 tablets (650 mg  total) by mouth every 6 (six) hours as needed for mild pain, moderate pain, fever or headache (or Fever >/= 101). Patient not taking: Reported on 02/04/2017 10/25/15   Elease Etienne, MD  Amino Acids-Protein Hydrolys (FEEDING SUPPLEMENT, PRO-STAT SUGAR FREE 64,) LIQD Take 30 mLs by mouth 2 (two) times daily. Patient not taking: Reported on 08/27/2016 10/25/15   Elease Etienne, MD  feeding supplement, ENSURE ENLIVE, (ENSURE ENLIVE) LIQD Take 237 mLs by mouth 2 (two) times daily between meals. 10/25/15   Elease Etienne, MD  furosemide (LASIX) 80 MG tablet Take 1 tablet (80 mg total) by mouth 2 (two) times daily. Patient not taking: Reported on 02/04/2017 11/04/16   Filbert Schilder, MD  guaiFENesin (MUCINEX) 600 MG 12 hr tablet Take 1 tablet (600 mg total) by mouth 2 (two) times daily. Patient not taking: Reported on 11/25/2016 11/04/16   Filbert Schilder, MD  ipratropium-albuterol (DUONEB) 0.5-2.5 (3) MG/3ML SOLN Take 3 mLs by nebulization every 2 (two) hours as needed. Patient not taking: Reported on 11/25/2016 11/04/16   Filbert Schilder, MD    Physical Exam:  Vitals:   02/04/17 0344 02/04/17 0705 02/04/17 0906  BP: (!) 104/57 109/71 115/68  Pulse: 91 85 81  Resp: (!) 22 19 15   Temp: 98.1 F (36.7 C) 98.6 F (37 C)   TempSrc: Oral Oral   SpO2: 94% 95% 91%    Constitutional: NAD, calm, comfortable Vitals:   02/04/17 0344 02/04/17 0705 02/04/17 0906  BP: (!) 104/57 109/71 115/68  Pulse: 91 85 81  Resp: (!) 22 19 15   Temp: 98.1 F (36.7 C) 98.6 F (37 C)   TempSrc: Oral Oral   SpO2: 94% 95% 91%   Eyes: PERRL, lids and conjunctivae normal ENMT: Mucous membranes are moist. Posterior pharynx clear of any exudate or lesions.Normal dentition.  Neck: normal, supple, no masses, no thyromegaly Respiratory: clear to auscultation bilaterally, no wheezing, no crackles. Normal respiratory effort. No accessory muscle use.  Cardiovascular: Regular rate and rhythm, no murmurs / rubs /  gallops. No extremity edema. 2+ pedal pulses. No carotid bruits.  Abdomen: no tenderness, no masses palpated. No hepatosplenomegaly. Bowel sounds positive.  Musculoskeletal: no clubbing / cyanosis. Patient has extreme pain on palpation over his right wrist and hand, slightly swollen and erythematous Skin: Patient has a small closed wound on the left foot right below third metatarsal, no purulent discharge no bleeding. Patient also has a vesicular rash on the right side of his chest involving the upper chest and shoulder, nontender; patient also has chronic venous stasis in both lower extremities Neurologic: CN 2-12 grossly intact. Sensation intact, DTR normal. Strength 5/5 in all 4.  Psychiatric: Normal judgment and insight. Alert and oriented x 3. Normal mood.    Labs on Admission: I have personally reviewed following labs and imaging studies  CBC:  Recent Labs Lab 02/04/17 0832  WBC 12.0*  NEUTROABS 10.6*  HGB 10.0*  HCT 30.2*  MCV 102.0*  PLT 209   Basic Metabolic Panel:  Recent Labs Lab 02/04/17 0832  NA 141  K 3.7  CL 106  CO2 23  GLUCOSE 70  BUN 55*  CREATININE 1.72*  CALCIUM 8.4*   GFR: CrCl cannot be calculated (Unknown ideal weight.). Liver Function Tests: No results for input(s): AST, ALT, ALKPHOS, BILITOT, PROT, ALBUMIN in the last 168 hours. No results for input(s): LIPASE, AMYLASE in the last 168 hours. No results for input(s): AMMONIA in the last 168 hours. Coagulation Profile:  Recent Labs Lab 02/04/17 0832  INR 4.65*   Cardiac Enzymes: No results for input(s): CKTOTAL, CKMB, CKMBINDEX, TROPONINI in the last 168 hours. BNP (last 3 results) No results for input(s): PROBNP in the last 8760 hours. HbA1C: No results for input(s): HGBA1C in the last 72 hours. CBG: No results for input(s): GLUCAP in the last 168 hours. Lipid Profile: No results for input(s): CHOL, HDL, LDLCALC, TRIG, CHOLHDL, LDLDIRECT in the last 72 hours. Thyroid Function  Tests: No results for input(s): TSH, T4TOTAL, FREET4, T3FREE, THYROIDAB in the last 72 hours. Anemia Panel: No results for input(s): VITAMINB12, FOLATE, FERRITIN, TIBC, IRON, RETICCTPCT in the last 72 hours. Urine analysis:  Sepsis Labs: @LABRCNTIP (procalcitonin:4,lacticidven:4) )No results found for this or any previous visit (from the past 240 hour(s)).   Radiological Exams on Admission: Dg Wrist Complete Right  Result Date: 02/04/2017 CLINICAL DATA:  History of gout, now with soft tissue swelling about the wrist. No known injury. EXAM: RIGHT WRIST - COMPLETE 3+ VIEW COMPARISON:  04/23/2013 FINDINGS: There is diffuse soft tissue swelling about the wrist. No associated fracture or dislocation. There is widening  of the scapholunate articulation with associated loss of the ulnar carpal joint space. Moderate degenerative change involving the radiocarpal joint as well as the STT joints of the base of the thumb with joint space loss, subchondral sclerosis and osteophytosis. No definitive erosions. No evidence of chondrocalcinosis. Vascular calcifications. No definite displacement of the pronator quadratus fat pad. IMPRESSION: 1. Diffuse soft tissue swelling about the wrist without associated fracture or dislocation. 2. Degenerative change of the wrist and widening of the scapholunate articulation with associated loss of the ulnar carpal joint space, progressed compared to the 04/2013 examination. Further evaluation with wrist MRI could be performed as clinically indicated. Electronically Signed   By: Simonne Come M.D.   On: 02/04/2017 07:35   Dg Foot Complete Left  Result Date: 02/04/2017 CLINICAL DATA:  Foot wound/ulcer. EXAM: LEFT FOOT - COMPLETE 3+ VIEW COMPARISON:  None. FINDINGS: Advanced hallux valgus with cross toe deformity. Lateral digits show hammertoe deformity. This limits visualization of the first and second digits in the lateral projection. No convincing erosion; no suspected  osteomyelitis. No fracture or traumatic malalignment. Osteopenia and arterial calcification. IMPRESSION: 1. No acute finding. 2. 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 Lateral hammertoes. Electronically Signed   By: Marnee Spring M.D.   On: 02/04/2017 07:33    EKG: Pending  Assessment/Plan  Active Problems:   Gout flare, right wrist and hand / probable cellulitis / cellulitis - Patient has pain, swelling and redness over the right wrist and hand which is concerning for gout flareup and possible cellulitis - We have resumed allopurinol but we will also add colchicine 1.2 mg one-time dose now and then 0.6 mg daily - We also added clindamycin for treatment of possible cellulitis, cellulitis order set utilized    Probable shingles over her right chest and right shoulder area - Airborne and contact precaution - Acyclovir per pharmacy    Left foot ulceration - Appreciate wound consultation. Patient has a small ulceration right below the third metatarsal but there is no open drainage - We started clindamycin empirically    Chronic diastolic CHF - 2-D echo in January 2018 showed ejection fraction of 55% with grade 3 diastolic dysfunction - Because of slight elevation in creatinine we will resume Lasix at home dose but once a day instead of twice a day - CHF reasonably compensated    Dyslipidemia - Continue Pravachol 10 mg daily    Atrial fibrillation, chronic - CHADS vasc score at least 2 - On anticoagulation with Coumadin; INR supratherapeutic so Coumadin will be held tonight - Patient apparently used to be on metoprolol but he does not remember when was the last time he has taken that medication. His heart rate is 81 without beta blockers    Chronic kidney disease stage III - Baseline creatinine 2017 was 1.3 and on this admission 1.7 - This may be reflective of acute infection versus Lasix. We have reduced the Lasix from twice a day to once a day and we will continue to  monitor renal function daily    Chronic venous stasis bilaterally in lower extremities - Appreciate wound care recommendations    DVT prophylaxis: INR is supratherapeutic so Coumadin on hold  Code Status: Full code  Family Communication: No family at the bedside Disposition Plan: Admission to telemetry Consults called: Wound care Admission status: Inpatient, patient presents with significant pain, erythema and swelling in the right wrist and hand. This is concerning for gout flareup. He also has lesions on his right  chest and shoulder which are suspicious for shingles and will require IV acyclovir, airborne and contact precaution. Patient also has suspicious foot ulceration that requires treatment with IV clindamycin. Wound care was consulted as well.   Manson Passey MD [facility] Pager [phone]  If 7PM-7AM, please contact night-coverage [email or web address] Password [facility]  02/04/2017, 10:26 AM

## 2017-02-04 NOTE — Progress Notes (Signed)
ANTICOAGULATION CONSULT NOTE - Initial Consult  Pharmacy Consult for coumadin Indication: atrial fibrillation  No Known Allergies  Patient Measurements:    Vital Signs: Temp: 98.6 F (37 C) (04/26 0705) Temp Source: Oral (04/26 0705) BP: 115/68 (04/26 0906) Pulse Rate: 81 (04/26 0906)  Labs:  Recent Labs  02/04/17 0832  HGB 10.0*  HCT 30.2*  PLT 209  LABPROT 45.1*  INR 4.65*  CREATININE 1.72*    CrCl cannot be calculated (Unknown ideal weight.).   Medical History: Past Medical History:  Diagnosis Date  . Alcoholism (HCC)   . Anemia   . Atrial fibrillation or flutter   . Cardiomyopathy    nonischemic  . CHF (congestive heart failure) (HCC)    ejection fractio of 45% per echo in 2011 (previos EF 35-40% in 2003), echo 04/2013  -LVEF 50-55%.  . Complication of anesthesia    April 11, 2013  . GERD (gastroesophageal reflux disease)    takes tums prn  . Gout   . Hyperlipidemia   . Hypertension   . Hypoxemia   . Obesity   . Ventricular tachycardia (HCC)    nonsustained, asymptomatic  Assessment: 71 yo M admitted with wrist pain.  Warf pta for A fib;  INR 4.65, Hg 10, pltc WNL 5 mg daily x 2.5 mg MWF. Last dose 4/25 at 0900 am  Goal of Therapy:  INR 2-3 Monitor platelets by anticoagulation protocol: Yes   Plan:  No coumadin today Daily INR  Herby Abraham, Pharm.D. [phone] 02/04/2017 10:55 AM

## 2017-02-04 NOTE — Clinical Social Work Note (Signed)
Clinical Social Work Assessment  Patient Details  Name: [name] MRN: [id number] Date of Birth: [birth year]  Date of referral:  02/04/17               Reason for consult:  Facility Placement, Discharge Planning                Permission sought to share information with:  Family Supports Permission granted to share information::     Name::     brother Tom Bean [phone]  Agency::     Relationship::     Contact Information:     Housing/Transportation Living arrangements for the past 2 months:  Single Family Home (with father) Source of Information:  Patient Patient Interpreter Needed:  None Criminal Activity/Legal Involvement Pertinent to Current Situation/Hospitalization:  No - Comment as needed Significant Relationships:  Siblings, Parents Lives with:  Parents Do you feel safe going back to the place where you live?  Yes Need for family participation in patient care:  No (Coment)  Care giving concerns:  Pt from home where he lives with elderly father. Was at SNF at [facility] February 2018 and states he was ambulating and performing ADLs alone when he returned home, recently ceased independent ambulation due to gout pain. States he performs ADLs independently still. Elderly father unable to provide assistance needed.  Brother lives nearby and is involved in pt's care, also unable to provide assistance needed per patient.  Patient reports he has been at rehab at [facility] "several times, they know me there and do a great job with therapy with me."   Social Worker assessment / plan:  LCSW consulted for potential SNF placement.  Met with pt at bedside. Pt agreeable to SNF if recommended. Prefers [facility]. Understands referral and insurance authorization requirements due to previous SNF placements.   PT not consulted as of yet. Will follow for recommendations.   Will need [facility] authorization for SNF.  Completed FL2.   Employment status:   Retired Nurse, adult PT Recommendations:  Not assessed at this time Information / Referral to community resources:  [place]  Patient/Family's Response to care:  Pt appreciative of care.   Patient/Family's Understanding of and Emotional Response to Diagnosis, Current Treatment, and Prognosis:  Patient demonstrates adequate understanding of treatment and plan. Hopeful for rehab at [facility] for short term "to get moving around and back home."  Emotional Assessment Appearance:  Appears stated age Attitude/Demeanor/Rapport:   (pleasant) Affect (typically observed):  Accepting, Calm Orientation:  Oriented to Self, Oriented to Place, Oriented to  Time, Oriented to Situation Alcohol / Substance use:  Not Applicable Psych involvement (Current and /or in the community):  No (Comment)  Discharge Needs  Concerns to be addressed:  Discharge Planning Concerns Readmission within the last 30 days:  No Current discharge risk:  None Barriers to Discharge:  Continued Medical Work up, [employer], LCSW 02/04/2017, 2:53 PM

## 2017-02-04 NOTE — [place] FL2 (Signed)
[place] MEDICAID FL2 LEVEL OF CARE SCREENING TOOL     IDENTIFICATION  Patient Name: [name] Birthdate: 1946-05-14 Sex: male Admission Date (Current Location): 02/04/2017  [facility] and [place] Number:  Producer, television/film/video and Address:  [facility],  [address] [place] [address]      Provider Number: [id number]  Attending Physician Name and Address:  Alison Murray, MD  Relative Name and Phone Number:       Current Level of Care: Hospital Recommended Level of Care: Skilled Nursing Facility Prior Approval Number:    Date Approved/Denied:   PASRR Number: [id number]  Discharge Plan: SNF    Current Diagnoses: Patient Active Problem List   Diagnosis Date Noted  . Gout flare 02/04/2017  . Hypertensive heart disease with heart failure (HCC)   . Acute on chronic diastolic heart failure (HCC)   . V-tach (HCC) 10/31/2016  . CHF exacerbation (HCC) 10/30/2016  . CHF (congestive heart failure) (HCC) 10/30/2016  . Bradycardia 03/16/2016  . Alcohol abuse 03/16/2016  . Acute encephalopathy 03/16/2016  . Alcohol withdrawal delirium (HCC) 03/16/2016  . Cor, pulmonale, acute (HCC)   . CAP (community acquired pneumonia) 03/06/2016  . Weakness of both lower extremities 03/04/2016  . Morbid obesity - BMI 45 10/23/2015  . Current use of long term anticoagulation 10/23/2015  . 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 Diastolic dysfunction with acute on chronic heart failure (HCC) 10/23/2015  . 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 Recurrent falls 10/20/2015  . Physical deconditioning 10/20/2015  . Hyperkalemia 10/20/2015  . Fall   . Streptococcus viridans infection 02/13/2015  . Sepsis (HCC) 02/07/2015  . Anemia of chronic disease 02/07/2015  . Alcoholic hepatitis 02/07/2015  . Thrombocytopenia (HCC) 02/07/2015  . Elevated troponin 02/06/2015  . Hematoma 04/24/2013  . Chronic atrial fibrillation (HCC) 04/24/2013  . Acute respiratory failure with  hypoxia (HCC) 04/23/2013  . Cellulitis 04/23/2013  . Acute on chronic systolic congestive heart failure (HCC) 04/22/2013  . Essential hypertension 12/06/2009    Orientation RESPIRATION BLADDER Height & Weight     Time, Situation, Place  Normal External catheter Weight: 283 lb 8.2 oz (128.6 kg) Height:  6' (182.9 cm)  BEHAVIORAL SYMPTOMS/MOOD NEUROLOGICAL BOWEL NUTRITION STATUS      Continent Diet (regular)  AMBULATORY STATUS COMMUNICATION OF NEEDS Skin   Limited Assist Verbally PU Stage and Appropriate Care, Other (Comment)  Stage II pressure injury. small ulceration right below the third metatarsal but there is no open drainage                       Personal Care Assistance Level of Assistance  Bathing, Feeding, Dressing Bathing Assistance: Limited assistance Feeding assistance: Independent Dressing Assistance: Limited assistance     Functional Limitations Info  Sight, Hearing, Speech Sight Info: Adequate Hearing Info: Adequate Speech Info: Adequate    SPECIAL CARE FACTORS FREQUENCY  PT (By licensed PT), OT (By licensed OT)     PT Frequency: 5x OT Frequency: 5x            Contractures Contractures Info: Not present    Additional Factors Info  Code Status, Allergies, Isolation Precautions Code Status Info: full Allergies Info: nka     Isolation Precautions Info: airborne precautions MRSA     Current Medications (02/04/2017):  This is the current hospital active medication list Current Facility-Administered Medications  Medication Dose Route Frequency Provider Last Rate Last Dose  . 0.9 %  sodium chloride infusion   Intravenous Continuous Alison Murray, MD      . acetaminophen (TYLENOL) tablet 650 mg  650 mg Oral Q6H PRN Alison Murray, MD       Or  . acetaminophen (TYLENOL) suppository 650 mg  650 mg Rectal Q6H PRN Alison Murray, MD      . acyclovir (ZOVIRAX) 200 MG capsule 800 mg  800 mg Oral 5 X Daily Alison Murray, MD   800 mg at 02/04/17 1320  .  allopurinol (ZYLOPRIM) tablet 100 mg  100 mg Oral BID Alison Murray, MD      . clindamycin (CLEOCIN) IVPB 600 mg  600 mg Intravenous Q8H Alison Murray, MD      . Melene Muller ON 02/05/2017] colchicine tablet 0.6 mg  0.6 mg Oral Daily Alison Murray, MD      . feeding supplement (ENSURE ENLIVE) (ENSURE ENLIVE) liquid 237 mL  237 mL Oral BID BM Alison Murray, MD      . folic acid (FOLVITE) tablet 1 mg  1 mg Oral Daily Alison Murray, MD      . furosemide (LASIX) tablet 40 mg  40 mg Oral Daily Alison Murray, MD      . HYDROcodone-acetaminophen (NORCO/VICODIN) 5-325 MG per tablet 1-2 tablet  1-2 tablet Oral Q4H PRN Alison Murray, MD   2 tablet at 02/04/17 1505  . multivitamin with minerals tablet 1 tablet  1 tablet Oral Daily Alison Murray, MD      . ondansetron [facility]) tablet 4 mg  4 mg Oral Q6H PRN Alison Murray, MD       Or  . ondansetron [facility]) injection 4 mg  4 mg Intravenous Q6H PRN Alison Murray, MD      . potassium chloride SA (K-DUR,KLOR-CON) CR tablet 20 mEq  20 mEq Oral BID Alison Murray, MD      . pravastatin (PRAVACHOL) tablet 10 mg  10 mg Oral Daily Alison Murray, MD      . sodium chloride flush (NS) 0.9 % injection 3 mL  3 mL Intravenous Q12H Alison Murray, MD      . thiamine (VITAMIN B-1) tablet 100 mg  100 mg Oral Daily Alison Murray, MD      . Warfarin - Pharmacist Dosing Inpatient   Does not apply [id number] Alison Murray, MD         Discharge Medications: Please see discharge summary for a list of discharge medications.  Relevant Imaging Results:  Relevant Lab Results:   Additional Information Social Security #: [id number]  Nelwyn Salisbury, LCSW

## 2017-02-04 NOTE — ED Notes (Signed)
Pt has urinal at bedside 

## 2017-02-04 NOTE — ED Notes (Signed)
Bed: [facility] Expected date:  Expected time:  Means of arrival:  Comments: EMS gout/unable to care for self

## 2017-02-04 NOTE — ED Triage Notes (Signed)
Per EMS, pt started having right wrist pain Monday night. Pt has history of gout in the left wrist, feels the same per patient. Pt is bed bound at home in a hospital bed. Non-ambulatory, is being taken care of by his 71 year old father. Pt is wet with urine on arrival. Unable to care for himself at home. Unable to sleep well due to the pain.

## 2017-02-04 NOTE — Progress Notes (Signed)
Pharmacy Antibiotic Note  GEOFF WARLEY is a 71 y.o. male admitted on 02/04/2017 with shingles.  Pharmacy has been consulted for acyclovir dosing. Wt 119 kg.  WBC 12, creat 1.72.    Plan: Acyclovir 800 mg PO 5 x a day for creat cl > 25 ml/min.  If creat cl < 25 ml/min dose will be 800 q8h f/u renal fxn.    Temp (24hrs), Avg:98.4 F (36.9 C), Min:98.1 F (36.7 C), Max:98.6 F (37 C)   Recent Labs Lab 02/04/17 0832  WBC 12.0*  CREATININE 1.72*    CrCl cannot be calculated (Unknown ideal weight.).    No Known Allergies  Antimicrobials this admission: vanc x 1 4/26 Zosyn x 1 4/26 Acyclovir 4/26 shingles>> clinda 4/26>> Dose adjustments this admission:  Microbiology results:  Herby Abraham, Pharm.D. [phone] 02/04/2017 10:37 AM

## 2017-02-05 DIAGNOSIS — L8990 Pressure ulcer of unspecified site, unspecified stage: Secondary | ICD-10-CM | POA: Insufficient documentation

## 2017-02-05 DIAGNOSIS — E440 Moderate protein-calorie malnutrition: Secondary | ICD-10-CM | POA: Insufficient documentation

## 2017-02-05 LAB — BASIC METABOLIC PANEL
ANION GAP: 11 (ref 5–15)
BUN: 63 mg/dL — AB (ref 6–20)
CO2: 25 mmol/L (ref 22–32)
Calcium: 8.4 mg/dL — ABNORMAL LOW (ref 8.9–10.3)
Chloride: 106 mmol/L (ref 101–111)
Creatinine, Ser: 1.94 mg/dL — ABNORMAL HIGH (ref 0.61–1.24)
GFR, EST AFRICAN AMERICAN: 39 mL/min — AB (ref >60)
GFR, EST NON AFRICAN AMERICAN: 34 mL/min — AB (ref >60)
Glucose, Bld: 91 mg/dL (ref 65–99)
POTASSIUM: 3.6 mmol/L (ref 3.5–5.1)
SODIUM: 142 mmol/L (ref 135–145)

## 2017-02-05 LAB — CBC
HCT: 30.9 % — ABNORMAL LOW (ref 39.0–52.0)
HEMOGLOBIN: 10.3 g/dL — AB (ref 13.0–17.0)
MCH: 33.7 pg (ref 26.0–34.0)
MCHC: 33.3 g/dL (ref 30.0–36.0)
MCV: 101 fL — ABNORMAL HIGH (ref 78.0–100.0)
PLATELETS: 228 10*3/uL (ref 150–400)
RBC: 3.06 MIL/uL — AB (ref 4.22–5.81)
RDW: 17 % — ABNORMAL HIGH (ref 11.5–15.5)
WBC: 11.2 10*3/uL — AB (ref 4.0–10.5)

## 2017-02-05 LAB — PROTIME-INR
INR: 6.71 — AB
PROTHROMBIN TIME: 60.6 s — AB (ref 11.4–15.2)

## 2017-02-05 LAB — GLUCOSE, CAPILLARY: GLUCOSE-CAPILLARY: 92 mg/dL (ref 65–99)

## 2017-02-05 MED ORDER — ACYCLOVIR 400 MG PO TABS
800.0000 mg | ORAL_TABLET | Freq: Every day | ORAL | Status: DC
  Administered 2017-02-05 – 2017-02-07 (×10): 800 mg via ORAL
  Filled 2017-02-05 (×10): qty 2

## 2017-02-05 NOTE — Progress Notes (Signed)
Initial Nutrition Assessment  DOCUMENTATION CODES:   Non-severe (moderate) malnutrition in context of chronic illness  INTERVENTION:   Ensure Enlive po BID, each supplement provides 350 kcal and 20 grams of protein  MVI  NUTRITION DIAGNOSIS:   Malnutrition (moderate) related to chronic illness, CHF, CKD as evidenced by moderate depletion of body fat, moderate depletions of muscle mass.  GOAL:   Patient will meet greater than or equal to 90% of their needs  MONITOR:   PO intake, Supplement acceptance, Labs, Weight trends, Skin  REASON FOR ASSESSMENT:   Low Braden    ASSESSMENT:   71 y.o. male with medical history significant for gout, chronic diastolic CHF, last 2-D echo in January 2018 showed ejection fraction 55% and grade 3 diastolic dysfunction, dyslipidemia, atrial fibrillation on anticoagulation with Coumadin. Patient presented to [facility] with worsening pain and redness and swelling in the right hand and his wrist.  Patient admitted for gout flare   Met with pt in room today. Pt reports good appetite pta and currently eating 75-100% meals and drinking Ensure. Pt's dinner last night and breakfast this morning consisted of high carbohydrate and low protein food items. RD discussed with pt the importance of adequate protein intake for wound healing and to preserve lean muscle mass. Pt with Stage II wound on leg and shingles. Pt with moderate  fat and muscle wasting and severe edema; pt meets criteria for moderate malnutrition but RD suspects that nutrition may be more severe. Continue to encourage intake of lean proteins at meals and supplements.   Medications reviewed and include: allopurinol, colchicine, folic acid, MVI, thiamine, warfarin, clindamycin, hydrocodone   Labs reviewed: BUN 63(H), creat 1.94(H), Ca 8.4(L) Wbc- 11.2(H)  Nutrition-Focused physical exam completed. Findings are moderate fat depletion in chest and upper arms, moderate muscle depletion in  clavicles and shoulders, and severe generalized edema. Unable to assess lower extremities r/t severe edema.   Diet Order:  Diet regular Room service appropriate? Yes; Fluid consistency: Thin  Skin:  Wound (see comment) (Stage II leg, shingles)  Last BM:  4/24  Height:   Ht Readings from Last 1 Encounters:  02/04/17 6' (1.829 m)    Weight:   Wt Readings from Last 1 Encounters:  02/05/17 287 lb 7.7 oz (130.4 kg)    Ideal Body Weight:  80.9 kg  BMI:  Body mass index is 38.99 kg/m.  Estimated Nutritional Needs:   Kcal:  2300-2600kcal/day   Protein:  130-156g/day   Fluid:  >2L/day   EDUCATION NEEDS:   No education needs identified at this time  Koleen Distance, RD, LDN Pager #[phone]

## 2017-02-05 NOTE — Evaluation (Signed)
Physical Therapy Evaluation Patient Details Name: [name] MRN: [id number] DOB: [birth year] Today's Date: 02/05/2017   History of Present Illness  71 y.o. male with medical history significant for gout, chronic diastolic CHF, dyslipidemia, atrial fibrillation on anticoagulation with Coumadin, morbid obesity and admitted for shingles and gout flare in R hand/wrist  Clinical Impression  Pt admitted with above diagnosis. Pt currently with functional limitations due to the deficits listed below (see PT Problem List).  Pt will benefit from skilled PT to increase their independence and safety with mobility to allow discharge to the venue listed below.  Pt requiring increased assist for bed mobility and transfers at this time.  Pain in R hand/wrist limiting pt's ability to assist (per pt) and pt may benefit from platform next visit.  Pt agreeable to SNF upon d/c prior to home with his father.     Follow Up Recommendations SNF;Supervision/Assistance - 24 hour    Equipment Recommendations  None recommended by PT    Recommendations for Other Services       Precautions / Restrictions Precautions Precautions: Fall      Mobility  Bed Mobility Overal bed mobility: Needs Assistance Bed Mobility: Supine to Sit     Supine to sit: Mod assist     General bed mobility comments: assist for scooting to EOB due to pain in L hand/wrist  Transfers Overall transfer level: Needs assistance Equipment used: Rolling walker (2 wheeled) Transfers: Sit to/from [employer] Sit to Stand: Mod assist Stand pivot transfers: Mod assist       General transfer comment: assist for rise and steady, verbal cues for safe technique, pt concerned with falling, only up to recliner today for safety  Ambulation/Gait                Stairs            Wheelchair Mobility    Modified Rankin (Stroke Patients Only)       Balance Overall balance assessment: History of Falls                                            Pertinent Vitals/Pain Pain Assessment: 0-10 Pain Score: 7  Pain Location: R hand/wrist from gout Pain Descriptors / Indicators: Sore;Guarding;Discomfort Pain Intervention(s): Limited activity within patient's tolerance;Repositioned;Monitored during session    Home Living Family/patient expects to be discharged to:: Private residence Living Arrangements: Parent (father) Available Help at Discharge: Family;Available PRN/intermittently Type of Home: House Home Access: Ramped entrance     Home Layout: One level Home Equipment: Grab bars - tub/shower;Walker - 2 wheels;Hospital bed Additional Comments: anticipates d/c to SNF for rehab    Prior Function Level of Independence: Independent with assistive device(s)         Comments: states he was doing well until fall prior to admission     Hand Dominance        Extremity/Trunk Assessment   Upper Extremity Assessment Upper Extremity Assessment: Generalized weakness;RUE deficits/detail RUE Deficits / Details: limited hand/wrist movement due to pain    Lower Extremity Assessment Lower Extremity Assessment: Generalized weakness       Communication   Communication: No difficulties  Cognition Arousal/Alertness: Awake/alert Behavior During Therapy: WFL for tasks assessed/performed Overall Cognitive Status: Within Functional Limits for tasks assessed  General Comments General comments (skin integrity, edema, etc.): plantar wound on L foot (please see WOC RN note) - encouraged pt to take weight through heel if possible    Exercises     Assessment/Plan    PT Assessment Patient needs continued PT services  PT Problem List Decreased strength;Decreased activity tolerance;Decreased balance;Decreased mobility;Pain;Decreased knowledge of use of DME;Obesity       PT Treatment Interventions Gait training;DME  instruction;Therapeutic activities;Functional mobility training;Therapeutic exercise;Patient/family education    PT Goals (Current goals can be found in the Care Plan section)  Acute Rehab PT Goals PT Goal Formulation: With patient Time For Goal Achievement: 02/19/17 Potential to Achieve Goals: Good    Frequency     Barriers to discharge        Co-evaluation               End of Session   Activity Tolerance: Patient tolerated treatment well Patient left: in chair;with call bell/phone within reach;with nursing/sitter in room Nurse Communication: Mobility status;Need for lift equipment (NT assisted with transfer, pt aware lift can be used for back to bed if needed) PT Visit Diagnosis: Difficulty in walking, not elsewhere classified (R26.2);Muscle weakness (generalized) (M62.81)    Time: 1430-1450 PT Time Calculation (min) (ACUTE ONLY): 20 min   Charges:   PT Evaluation $PT Eval Moderate Complexity: 1 Procedure     PT G Codes:        Zenovia Jarred, PT, DPT 02/05/2017 Pager: [phone]   Maida Sale E 02/05/2017, 3:19 PM

## 2017-02-05 NOTE — Progress Notes (Signed)
Assumed care of pt @ 1530.  I agree with previous RN's assessment.  Will continue with plan of care.  

## 2017-02-05 NOTE — Progress Notes (Addendum)
Pt would like to go to SNF for rehab and selected [facility]. CSW following pt.

## 2017-02-05 NOTE — Progress Notes (Signed)
CRITICAL VALUE ALERT  Critical value received:  INR 6.71  Date of notification:  02/05/17  Time of notification:  0742  Critical value read back: Yes  Nurse who received alert:  [facility]  MD notified (1st page):  Alvino Chapel  Time of first page:  [phone]  MD notified (2nd page):  Time of second page:  Responding MD:    Time MD responded:

## 2017-02-05 NOTE — Progress Notes (Addendum)
PROGRESS NOTE    [name]  ZOX:[id number] DOB: [birth year] DOA: 02/04/2017 PCP: [facility]     Brief Narrative:  [name] is a 71 y.o. male with medical history significant for gout, chronic diastolic CHF, last 2-D echo in January 2018 showed ejection fraction 55% and grade 3 diastolic dysfunction, dyslipidemia, atrial fibrillation on anticoagulation with Coumadin. Patient presented to [facility] long with worsening pain and redness and swelling in the right hand and his wrist. He reports taking allopurinol as prescribed and has not had gout flareup in a while but over past 3 days he was trying to get up from the bed and he mostly uses a right hand and this time the pain was getting worse and he could not get himself up. Patient reports no fevers. No reports of chills. He does have a rash over his right shoulder and chest but reports no pain over that area. He is not sure for how long he has had about a rash that has been there for at least a week or so. He was admitted for further treatment of his right hand gout, right chest shingles, and left foot ulcer.  Assessment & Plan:   Active Problems:   Gout flare   Pressure injury of skin   Gout flare, right wrist and hand with likely overlying cellulitis - Continue allopurinol, colchicine and clindamycin - Patient reports improvement in appearance and pain today    Shingles over right chest and right shoulder  - Airborne and contact precaution - Acyclovir   Left foot ulcer - Does not appear grossly infected  - Appreciate wound consultation  AKI on CKD stage 3 - Baseline creatinine 2017 was 1.3 - IVF   Asymptomatic pyuria - No dysuria per report  Chronic diastolic CHF - 2-D echo in January 2018 showed ejection fraction of 55% with grade 3 diastolic dysfunction - CHF reasonably compensated - Holding lasix due to renal dysfunction   Dyslipidemia - Continue Pravachol 10 mg daily  Atrial  fibrillation - CHADS vasc score at least 2 - On anticoagulation with Coumadin; INR supratherapeutic so Coumadin will be held   Chronic venous stasis bilaterally in lower extremities - Appreciate wound care recommendations   DVT prophylaxis: holding coumadin due to supratherapeutic INR Code Status: Full Family Communication: No family at bedside Disposition Plan: SNF    Consultants:   None  Procedures:   None  Antimicrobials:  Anti-infectives    Start     Dose/Rate Route Frequency Ordered Stop   02/05/17 0615  acyclovir (ZOVIRAX) tablet 800 mg     800 mg Oral 5 times daily 02/05/17 0613     02/04/17 1400  clindamycin (CLEOCIN) IVPB 600 mg     600 mg 100 mL/hr over 30 Minutes Intravenous Every 8 hours 02/04/17 1027     02/04/17 1045  acyclovir (ZOVIRAX) 200 MG capsule 800 mg  Status:  Discontinued     800 mg Oral 5 times daily 02/04/17 1036 02/05/17 0614   02/04/17 0745  vancomycin (VANCOCIN) IVPB 1000 mg/200 mL premix     1,000 mg 200 mL/hr over 60 Minutes Intravenous  Once 02/04/17 0736 02/04/17 1022   02/04/17 0745  piperacillin-tazobactam (ZOSYN) IVPB 3.375 g     3.375 g 100 mL/hr over 30 Minutes Intravenous  Once 02/04/17 0736 02/04/17 0915         Subjective: Patient states that his right wrist is feeling much better today and the swelling has improved.  He denies any pain overlying the shingles rash on his right shoulder, chest, upper back. He is unsure when his rash started, but thinks it was about a week ago. He denies any fevers, chills, chest pain or shortness of breath. He denies any dysuria prior to admission.  Objective: Vitals:   02/04/17 1220 02/04/17 2250 02/05/17 0555 02/05/17 1027  BP: 118/64 110/66 111/66 115/69  Pulse: 77 71 80 87  Resp: 15 17 16    Temp: 97.6 F (36.4 C) 98.5 F (36.9 C) 97.8 F (36.6 C) 97.8 F (36.6 C)  TempSrc: Oral Oral Oral Oral  SpO2: 97% 95% 97% 94%  Weight: 128.6 kg (283 lb 8.2 oz)  130.4 kg (287 lb 7.7 oz)     Height: 6' (1.829 m)       Intake/Output Summary (Last 24 hours) at 02/05/17 1137 Last data filed at 02/05/17 0622  Gross per 24 hour  Intake              454 ml  Output              700 ml  Net             -246 ml   Filed Weights   02/04/17 1220 02/05/17 0555  Weight: 128.6 kg (283 lb 8.2 oz) 130.4 kg (287 lb 7.7 oz)    Examination:  General exam: Appears calm and comfortable  Respiratory system: Clear to auscultation. Respiratory effort normal. Cardiovascular system: S1 & S2 heard, RRR. No JVD, murmurs, rubs, gallops or clicks. Gastrointestinal system: Abdomen is nondistended, soft and nontender.  Central nervous system: Alert and oriented. No focal neurological deficits. Extremities: +right wrist and hand with edema compared to left, no significant erythema noted  Skin: +crusting vesicular rash right chest, right shoulder, right upper back, +ulcer on plantar aspect left foot, +bilateral chronic venous stasis  Psychiatry: Judgement and insight appear normal. Mood & affect appropriate.   Data Reviewed: I have personally reviewed following labs and imaging studies  CBC:  Recent Labs Lab 02/04/17 0832 02/05/17 0550  WBC 12.0* 11.2*  NEUTROABS 10.6*  --   HGB 10.0* 10.3*  HCT 30.2* 30.9*  MCV 102.0* 101.0*  PLT 209 228   Basic Metabolic Panel:  Recent Labs Lab 02/04/17 0832 02/05/17 0550  NA 141 142  K 3.7 3.6  CL 106 106  CO2 23 25  GLUCOSE 70 91  BUN 55* 63*  CREATININE 1.72* 1.94*  CALCIUM 8.4* 8.4*   GFR: Estimated Creatinine Clearance: 50.2 mL/min (A) (by C-G formula based on SCr of 1.94 mg/dL (H)). Liver Function Tests: No results for input(s): AST, ALT, ALKPHOS, BILITOT, PROT, ALBUMIN in the last 168 hours. No results for input(s): LIPASE, AMYLASE in the last 168 hours. No results for input(s): AMMONIA in the last 168 hours. Coagulation Profile:  Recent Labs Lab 02/04/17 0832 02/05/17 0550  INR 4.65* 6.71*   Cardiac Enzymes: No results for  input(s): CKTOTAL, CKMB, CKMBINDEX, TROPONINI in the last 168 hours. BNP (last 3 results) No results for input(s): PROBNP in the last 8760 hours. HbA1C: No results for input(s): HGBA1C in the last 72 hours. CBG:  Recent Labs Lab 02/05/17 0750  GLUCAP 92   Lipid Profile: No results for input(s): CHOL, HDL, LDLCALC, TRIG, CHOLHDL, LDLDIRECT in the last 72 hours. Thyroid Function Tests:  Recent Labs  02/04/17 1434  TSH 1.157   Anemia Panel: No results for input(s): VITAMINB12, FOLATE, FERRITIN, TIBC, IRON, RETICCTPCT in the last 72 hours.  Sepsis Labs: No results for input(s): PROCALCITON, LATICACIDVEN in the last 168 hours.  Recent Results (from the past 240 hour(s))  Culture, blood (routine x 2)     Status: None (Preliminary result)   Collection Time: 02/04/17  8:30 AM  Result Value Ref Range Status   Specimen Description BLOOD LEFT FOREARM  Final   Special Requests   Final    BOTTLES DRAWN AEROBIC AND ANAEROBIC Blood Culture adequate volume   Culture   Final    NO GROWTH 1 DAY Performed at [facility] Lab, [address] [place], [place] [address]    Report Status PENDING  Incomplete  Culture, blood (routine x 2)     Status: None (Preliminary result)   Collection Time: 02/04/17  8:32 AM  Result Value Ref Range Status   Specimen Description BLOOD LEFT HAND  Final   Special Requests   Final    BOTTLES DRAWN AEROBIC AND ANAEROBIC Blood Culture adequate volume   Culture   Final    NO GROWTH 1 DAY Performed at [facility], [address], [place], [place] [address]    Report Status PENDING  Incomplete       Radiology Studies: Dg Wrist Complete Right  Result Date: 02/04/2017 CLINICAL DATA:  History of gout, now with soft tissue swelling about the wrist. No known injury. EXAM: RIGHT WRIST - COMPLETE 3+ VIEW COMPARISON:  04/23/2013 FINDINGS: There is diffuse soft tissue swelling about the wrist. No associated fracture or dislocation. There is widening of the  scapholunate articulation with associated loss of the ulnar carpal joint space. Moderate degenerative change involving the radiocarpal joint as well as the STT joints of the base of the thumb with joint space loss, subchondral sclerosis and osteophytosis. No definitive erosions. No evidence of chondrocalcinosis. Vascular calcifications. No definite displacement of the pronator quadratus fat pad. IMPRESSION: 1. Diffuse soft tissue swelling about the wrist without associated fracture or dislocation. 2. Degenerative change of the wrist and widening of the scapholunate articulation with associated loss of the ulnar carpal joint space, progressed compared to the 04/2013 examination. Further evaluation with wrist MRI could be performed as clinically indicated. Electronically Signed   By: Simonne Come M.D.   On: 02/04/2017 07:35   Dg Foot Complete Left  Result Date: 02/04/2017 CLINICAL DATA:  Foot wound/ulcer. EXAM: LEFT FOOT - COMPLETE 3+ VIEW COMPARISON:  None. FINDINGS: Advanced hallux valgus with cross toe deformity. Lateral digits show hammertoe deformity. This limits visualization of the first and second digits in the lateral projection. No convincing erosion; no suspected osteomyelitis. No fracture or traumatic malalignment. Osteopenia and arterial calcification. IMPRESSION: 1. No acute finding. 2. 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 Lateral hammertoes. Electronically Signed   By: Marnee Spring M.D.   On: 02/04/2017 07:33      Scheduled Meds: . acyclovir  800 mg Oral 5 X Daily  . allopurinol  100 mg Oral BID  . colchicine  0.6 mg Oral Daily  . feeding supplement (ENSURE ENLIVE)  237 mL Oral BID BM  . folic acid  1 mg Oral Daily  . multivitamin with minerals  1 tablet Oral Daily  . pravastatin  10 mg Oral Daily  . sodium chloride flush  3 mL Intravenous Q12H  . thiamine  100 mg Oral Daily  . Warfarin - Pharmacist Dosing Inpatient   Does not apply q1800   Continuous Infusions: .  sodium chloride 50 mL/hr at 02/05/17 0830  . clindamycin (  CLEOCIN) IV Stopped (02/05/17 8889)     LOS: 1 day    Time spent: 40 minutes   Noralee Stain, DO [facility] [email or web address] Password [facility] 02/05/2017, 11:37 AM

## 2017-02-05 NOTE — Consult Note (Signed)
WOC Nurse wound consult note Reason for Consult: Left foot with denuded lesions between toes.  Eschar on plantar surface of foot below third metatarsal.   Wound type:Chronic nonhealing  Pressure Injury POA: Yes Measurement: 1 cm x 1 cm eschar to plantar foot.  ).5 cm nonintact lesions between second, third and fourth metatarsals.   Wound GMW:NUUVO red Drainage (amount, consistency, odor) minimal serosanguinous  Musty odor.  Periwound:Dry skin Dressing procedure/placement/frequency:Cleanse wounds to left foot.  Apply strips of Aquacel Ag between toes. Cover with kerlix and tape.  Change Mon/Wed/Fri.  Will not follow at this time.  Please re-consult if needed.  Maple Hudson RN BSN CWON Pager [phone]

## 2017-02-05 NOTE — Progress Notes (Signed)
ANTICOAGULATION CONSULT NOTE - Follow Up Consult  Pharmacy Consult for warfarin Indication: hx atrial fibrillation  No Known Allergies  Patient Measurements: Height: 6' (182.9 cm) Weight: 287 lb 7.7 oz (130.4 kg) IBW/kg (Calculated) : 77.6 Heparin Dosing Weight:   Vital Signs: Temp: 97.8 F (36.6 C) (04/27 0555) Temp Source: Oral (04/27 0555) BP: 111/66 (04/27 0555) Pulse Rate: 80 (04/27 0555)  Labs:  Recent Labs  02/04/17 0832 02/05/17 0550  HGB 10.0* 10.3*  HCT 30.2* 30.9*  PLT 209 228  LABPROT 45.1*  --   INR 4.65*  --   CREATININE 1.72* 1.94*    Estimated Creatinine Clearance: 50.2 mL/min (A) (by C-G formula based on SCr of 1.94 mg/dL (H)).   Medications:  Home warfarin regimen: 5 mg daily except 2.5mg  on MWF  Assessment: Patient's a 71 y.o M with hx gout and afib on warfarin PTA, presented to the ED on 02/04/17 with c/o right wrist pain.  Warfarin resumed on admission.  INR on admission was supra-therapeuitc at 4.62  Today, 02/05/2017: - INR remains supra-therapeutic and up today at 6.71 despite dose held yesterday - cbc relatively stable - no bleeding documented - on regular diet - drug-drug intxns: clindamycin--abx can make patient more sensitive to warfarin   Goal of Therapy:  INR 2-3 Monitor platelets by anticoagulation protocol: Yes   Plan:  - continue to hold warfarin today - daily INR - monitor for s/s bleeding  Ziza Hastings P 02/05/2017,7:34 AM

## 2017-02-06 ENCOUNTER — Inpatient Hospital Stay (HOSPITAL_COMMUNITY): Payer: Medicare HMO

## 2017-02-06 LAB — BLOOD CULTURE ID PANEL (REFLEXED)
ACINETOBACTER BAUMANNII: NOT DETECTED
CANDIDA GLABRATA: NOT DETECTED
CANDIDA KRUSEI: NOT DETECTED
Candida albicans: NOT DETECTED
Candida parapsilosis: NOT DETECTED
Candida tropicalis: NOT DETECTED
ENTEROBACTER CLOACAE COMPLEX: NOT DETECTED
ENTEROCOCCUS SPECIES: NOT DETECTED
ESCHERICHIA COLI: NOT DETECTED
Enterobacteriaceae species: NOT DETECTED
Haemophilus influenzae: NOT DETECTED
Klebsiella oxytoca: NOT DETECTED
Klebsiella pneumoniae: NOT DETECTED
LISTERIA MONOCYTOGENES: NOT DETECTED
Methicillin resistance: DETECTED — AB
NEISSERIA MENINGITIDIS: NOT DETECTED
PROTEUS SPECIES: NOT DETECTED
PSEUDOMONAS AERUGINOSA: NOT DETECTED
STAPHYLOCOCCUS SPECIES: DETECTED — AB
STREPTOCOCCUS AGALACTIAE: NOT DETECTED
STREPTOCOCCUS PNEUMONIAE: NOT DETECTED
STREPTOCOCCUS SPECIES: NOT DETECTED
Serratia marcescens: NOT DETECTED
Staphylococcus aureus (BCID): DETECTED — AB
Streptococcus pyogenes: NOT DETECTED

## 2017-02-06 LAB — PROTIME-INR
INR: >10
Prothrombin Time: 83.4 seconds — ABNORMAL HIGH (ref 11.4–15.2)

## 2017-02-06 LAB — CBC WITH DIFFERENTIAL/PLATELET
BASOS ABS: 0 10*3/uL (ref 0.0–0.1)
BASOS PCT: 0 %
EOS ABS: 0 10*3/uL (ref 0.0–0.7)
EOS PCT: 0 %
HCT: 32 % — ABNORMAL LOW (ref 39.0–52.0)
Hemoglobin: 10.7 g/dL — ABNORMAL LOW (ref 13.0–17.0)
Lymphocytes Relative: 8 %
Lymphs Abs: 1 10*3/uL (ref 0.7–4.0)
MCH: 33.8 pg (ref 26.0–34.0)
MCHC: 33.4 g/dL (ref 30.0–36.0)
MCV: 100.9 fL — ABNORMAL HIGH (ref 78.0–100.0)
Monocytes Absolute: 0.4 10*3/uL (ref 0.1–1.0)
Monocytes Relative: 3 %
Neutro Abs: 11.2 10*3/uL — ABNORMAL HIGH (ref 1.7–7.7)
Neutrophils Relative %: 89 %
PLATELETS: 242 10*3/uL (ref 150–400)
RBC: 3.17 MIL/uL — AB (ref 4.22–5.81)
RDW: 16.8 % — ABNORMAL HIGH (ref 11.5–15.5)
WBC: 12.6 10*3/uL — AB (ref 4.0–10.5)

## 2017-02-06 LAB — BASIC METABOLIC PANEL
ANION GAP: 11 (ref 5–15)
BUN: 66 mg/dL — AB (ref 6–20)
CALCIUM: 8.3 mg/dL — AB (ref 8.9–10.3)
CHLORIDE: 106 mmol/L (ref 101–111)
CO2: 24 mmol/L (ref 22–32)
CREATININE: 2.2 mg/dL — AB (ref 0.61–1.24)
GFR, EST AFRICAN AMERICAN: 33 mL/min — AB (ref >60)
GFR, EST NON AFRICAN AMERICAN: 29 mL/min — AB (ref >60)
Glucose, Bld: 103 mg/dL — ABNORMAL HIGH (ref 65–99)
Potassium: 3.8 mmol/L (ref 3.5–5.1)
Sodium: 141 mmol/L (ref 135–145)

## 2017-02-06 LAB — GLUCOSE, CAPILLARY: GLUCOSE-CAPILLARY: 94 mg/dL (ref 65–99)

## 2017-02-06 LAB — CK: Total CK: 60 U/L (ref 49–397)

## 2017-02-06 MED ORDER — PHYTONADIONE 5 MG PO TABS
10.0000 mg | ORAL_TABLET | Freq: Once | ORAL | Status: DC
  Filled 2017-02-06: qty 2

## 2017-02-06 MED ORDER — SODIUM CHLORIDE 0.9 % IV SOLN
800.0000 mg | INTRAVENOUS | Status: DC
  Administered 2017-02-06 – 2017-02-07 (×2): 800 mg via INTRAVENOUS
  Filled 2017-02-06 (×2): qty 16

## 2017-02-06 MED ORDER — VITAMIN K1 10 MG/ML IJ SOLN
10.0000 mg | Freq: Once | INTRAMUSCULAR | Status: AC
  Administered 2017-02-06: 10 mg via SUBCUTANEOUS
  Filled 2017-02-06: qty 1

## 2017-02-06 MED ORDER — PROMETHAZINE HCL 25 MG/ML IJ SOLN
12.5000 mg | Freq: Four times a day (QID) | INTRAMUSCULAR | Status: DC | PRN
  Administered 2017-02-06 (×2): 12.5 mg via INTRAVENOUS
  Filled 2017-02-06 (×2): qty 1

## 2017-02-06 MED ORDER — VANCOMYCIN HCL 10 G IV SOLR: 1250.0000 mg | INTRAVENOUS | Status: DC

## 2017-02-06 MED ORDER — VANCOMYCIN HCL 10 G IV SOLR
1500.0000 mg | Freq: Every day | INTRAVENOUS | Status: DC
  Filled 2017-02-06: qty 1500

## 2017-02-06 MED ORDER — VANCOMYCIN HCL 10 G IV SOLR
1500.0000 mg | Freq: Once | INTRAVENOUS | Status: AC
  Administered 2017-02-06: 1500 mg via INTRAVENOUS
  Filled 2017-02-06: qty 1500

## 2017-02-06 NOTE — Progress Notes (Signed)
PROGRESS NOTE    [name]  YQM:[id number] DOB: 08-31-1946 DOA: 02/04/2017 PCP: [facility]     Brief Narrative:  [name] is a 71 y.o. male with medical history significant for gout, chronic diastolic CHF, last 2-D echo in January 2018 showed ejection fraction 55% and grade 3 diastolic dysfunction, dyslipidemia, atrial fibrillation on anticoagulation with Coumadin. Patient presented to [facility] long with worsening pain and redness and swelling in the right hand and his wrist. He reports taking allopurinol as prescribed and has not had gout flareup in a while but over past 3 days he was trying to get up from the bed and he mostly uses a right hand and this time the pain was getting worse and he could not get himself up. Patient reports no fevers. No reports of chills. He does have a rash over his right shoulder and chest but reports no pain over that area. He is not sure for how long he has had about a rash that has been there for at least a week or so. He was admitted for further treatment of his right hand gout, right chest shingles, and left foot ulcer.  Assessment & Plan:   Active Problems:   Gout flare   Pressure injury of skin   Malnutrition of moderate degree   Gout flare, right wrist and hand with likely overlying cellulitis - Continue allopurinol, colchicine and vanco  - Improving in appearance    MRSA bacteremia - Repeat blood cultures pending - Vanco started overnight   Shingles over right chest and right shoulder  - Airborne and contact precaution - Acyclovir   AKI on CKD stage 3 - Baseline creatinine 2017 was 1.3 - IVF  - Check Renal US   Supratherapeutic INR - Vit K today - Continue to hold coumadin - Daily INR  Left foot ulcer - Does not appear grossly infected  - Appreciate wound consultation  Asymptomatic pyuria - No dysuria per report  Chronic diastolic CHF - 2-D echo in January 2018 showed ejection fraction  of 55% with grade 3 diastolic dysfunction - CHF reasonably compensated - Holding lasix due to renal dysfunction   Dyslipidemia - Continue Pravachol 10 mg daily  Atrial fibrillation - CHADS vasc score at least 2 - On anticoagulation with Coumadin; INR supratherapeutic so Coumadin will be held   Chronic venous stasis bilaterally in lower extremities - Appreciate wound care recommendations   DVT prophylaxis: holding coumadin due to supratherapeutic INR Code Status: Full Family Communication: No family at bedside Disposition Plan: SNF    Consultants:   None  Procedures:   None  Antimicrobials:  Anti-infectives    Start     Dose/Rate Route Frequency Ordered Stop   02/06/17 2200  vancomycin (VANCOCIN) 1,250 mg in sodium chloride 0.9 % 250 mL IVPB  Status:  Discontinued     1,250 mg 166.7 mL/hr over 90 Minutes Intravenous Every 24 hours 02/06/17 0406 02/06/17 0413   02/06/17 2200  vancomycin (VANCOCIN) 1,500 mg in sodium chloride 0.9 % 500 mL IVPB     1,500 mg 250 mL/hr over 120 Minutes Intravenous Daily at bedtime 02/06/17 0413     02/06/17 0415  vancomycin (VANCOCIN) 1,500 mg in sodium chloride 0.9 % 500 mL IVPB     1,500 mg 250 mL/hr over 120 Minutes Intravenous  Once 02/06/17 0406 02/06/17 0703   02/05/17 0615  acyclovir (ZOVIRAX) tablet 800 mg     800 mg Oral 5 times daily  02/05/17 0613     02/04/17 1400  clindamycin (CLEOCIN) IVPB 600 mg  Status:  Discontinued     600 mg 100 mL/hr over 30 Minutes Intravenous Every 8 hours 02/04/17 1027 02/06/17 0358   02/04/17 1045  acyclovir (ZOVIRAX) 200 MG capsule 800 mg  Status:  Discontinued     800 mg Oral 5 times daily 02/04/17 1036 02/05/17 0614   02/04/17 0745  vancomycin (VANCOCIN) IVPB 1000 mg/200 mL premix     1,000 mg 200 mL/hr over 60 Minutes Intravenous  Once 02/04/17 0736 02/04/17 1022   02/04/17 0745  piperacillin-tazobactam (ZOSYN) IVPB 3.375 g     3.375 g 100 mL/hr over 30 Minutes Intravenous  Once 02/04/17 0736  02/04/17 0915         Subjective: Patient states that he is feeling much better today. He did have a couple of vomiting episodes early this morning prior to my examination, which has now resolved. Denies any new complaints of fevers or chills. He states that his right wrist gout has improved. No chest pain or shortness of breath, no abdominal pain.   Objective: Vitals:   02/05/17 1027 02/05/17 1457 02/05/17 2132 02/06/17 0457  BP: 115/69 132/78 116/69 109/61  Pulse: 87 99 83 88  Resp:  16 16 18   Temp: 97.8 F (36.6 C) 98 F (36.7 C) 97.9 F (36.6 C) 98.3 F (36.8 C)  TempSrc: Oral Oral Oral Oral  SpO2: 94% 94% 97% 96%  Weight:    131.5 kg (289 lb 14.5 oz)  Height:        Intake/Output Summary (Last 24 hours) at 02/06/17 1230 Last data filed at 02/06/17 1610  Gross per 24 hour  Intake          1201.67 ml  Output              425 ml  Net           776.67 ml   Filed Weights   02/04/17 1220 02/05/17 0555 02/06/17 0457  Weight: 128.6 kg (283 lb 8.2 oz) 130.4 kg (287 lb 7.7 oz) 131.5 kg (289 lb 14.5 oz)    Examination:  General exam: Appears calm and comfortable  Respiratory system: Clear to auscultation. Respiratory effort normal. Cardiovascular system: S1 & S2 heard, RRR. No JVD, murmurs, rubs, gallops or clicks. Gastrointestinal system: Abdomen is nondistended, soft and nontender.  Central nervous system: Alert and oriented. No focal neurological deficits. Extremities: +right wrist and hand with edema compared to left, no significant erythema noted. Improved since ysterday Skin: +crusting vesicular rash right chest, right shoulder, right upper back, +ulcer on plantar aspect left foot, +bilateral chronic venous stasis  Psychiatry: Judgement and insight appear normal. Mood & affect appropriate.   Data Reviewed: I have personally reviewed following labs and imaging studies  CBC:  Recent Labs Lab 02/04/17 0832 02/05/17 0550 02/06/17 0535  WBC 12.0* 11.2* 12.6*    NEUTROABS 10.6*  --  11.2*  HGB 10.0* 10.3* 10.7*  HCT 30.2* 30.9* 32.0*  MCV 102.0* 101.0* 100.9*  PLT 209 228 242   Basic Metabolic Panel:  Recent Labs Lab 02/04/17 0832 02/05/17 0550 02/06/17 0535  NA 141 142 141  K 3.7 3.6 3.8  CL 106 106 106  CO2 23 25 24   GLUCOSE 70 91 103*  BUN 55* 63* 66*  CREATININE 1.72* 1.94* 2.20*  CALCIUM 8.4* 8.4* 8.3*   GFR: Estimated Creatinine Clearance: 44.5 mL/min (A) (by C-G formula based on SCr of 2.2  mg/dL (H)). Liver Function Tests: No results for input(s): AST, ALT, ALKPHOS, BILITOT, PROT, ALBUMIN in the last 168 hours. No results for input(s): LIPASE, AMYLASE in the last 168 hours. No results for input(s): AMMONIA in the last 168 hours. Coagulation Profile:  Recent Labs Lab 02/04/17 0832 02/05/17 0550 02/06/17 0535  INR 4.65* 6.71* >10.00*   Cardiac Enzymes: No results for input(s): CKTOTAL, CKMB, CKMBINDEX, TROPONINI in the last 168 hours. BNP (last 3 results) No results for input(s): PROBNP in the last 8760 hours. HbA1C: No results for input(s): HGBA1C in the last 72 hours. CBG:  Recent Labs Lab 02/05/17 0750 02/06/17 0806  GLUCAP 92 94   Lipid Profile: No results for input(s): CHOL, HDL, LDLCALC, TRIG, CHOLHDL, LDLDIRECT in the last 72 hours. Thyroid Function Tests:  Recent Labs  02/04/17 1434  TSH 1.157   Anemia Panel: No results for input(s): VITAMINB12, FOLATE, FERRITIN, TIBC, IRON, RETICCTPCT in the last 72 hours. Sepsis Labs: No results for input(s): PROCALCITON, LATICACIDVEN in the last 168 hours.  Recent Results (from the past 240 hour(s))  Culture, blood (routine x 2)     Status: None (Preliminary result)   Collection Time: 02/04/17  8:30 AM  Result Value Ref Range Status   Specimen Description BLOOD LEFT FOREARM  Final   Special Requests   Final    BOTTLES DRAWN AEROBIC AND ANAEROBIC Blood Culture adequate volume   Culture  Setup Time   Final    ANAEROBIC BOTTLE ONLY GRAM POSITIVE COCCI IN  CLUSTERS TO JGRIMSLEY(PHARMd) BY [facility] 02/05/2017 AT 3:53AM Performed at [facility], [address] [place], [place] [address]    Culture GRAM POSITIVE COCCI  Final   Report Status PENDING  Incomplete  Blood Culture ID Panel (Reflexed)     Status: Abnormal   Collection Time: 02/04/17  8:30 AM  Result Value Ref Range Status   Enterococcus species NOT DETECTED NOT DETECTED Final   Listeria monocytogenes NOT DETECTED NOT DETECTED Final   Staphylococcus species DETECTED (A) NOT DETECTED Final    Comment: CRITICAL RESULT CALLED TO, READ BACK BY AND VERIFIED WITH: TO TO JGRIMSLEY(PHARMd0 BY TCLEVELAND 03/07/2017 AT 3:53AM    Staphylococcus aureus DETECTED (A) NOT DETECTED Final    Comment: Methicillin (oxacillin)-resistant Staphylococcus aureus (MRSA). MRSA is predictably resistant to beta-lactam antibiotics (except ceftaroline). Preferred therapy is vancomycin unless clinically contraindicated. Patient requires contact precautions if  hospitalized. CRITICAL RESULT CALLED TO, READ BACK BY AND VERIFIED WITH: TO TO JGRIMSLEY(PHARMd0 BY [facility] 03/07/2017 AT 3:53AM    Methicillin resistance DETECTED (A) NOT DETECTED Final    Comment: CRITICAL RESULT CALLED TO, READ BACK BY AND VERIFIED WITH: TO TO JGRIMSLEY(PHARMd0 BY TCLEVELAND 03/07/2017 AT 3:53AM    Streptococcus species NOT DETECTED NOT DETECTED Final   Streptococcus agalactiae NOT DETECTED NOT DETECTED Final   Streptococcus pneumoniae NOT DETECTED NOT DETECTED Final   Streptococcus pyogenes NOT DETECTED NOT DETECTED Final   Acinetobacter baumannii NOT DETECTED NOT DETECTED Final   Enterobacteriaceae species NOT DETECTED NOT DETECTED Final   Enterobacter cloacae complex NOT DETECTED NOT DETECTED Final   Escherichia coli NOT DETECTED NOT DETECTED Final   Klebsiella oxytoca NOT DETECTED NOT DETECTED Final   Klebsiella pneumoniae NOT DETECTED NOT DETECTED Final   Proteus species NOT DETECTED NOT DETECTED Final   Serratia  marcescens NOT DETECTED NOT DETECTED Final   Haemophilus influenzae NOT DETECTED NOT DETECTED Final   Neisseria meningitidis NOT DETECTED NOT DETECTED Final   Pseudomonas aeruginosa NOT DETECTED NOT  DETECTED Final   Candida albicans NOT DETECTED NOT DETECTED Final   Candida glabrata NOT DETECTED NOT DETECTED Final   Candida krusei NOT DETECTED NOT DETECTED Final   Candida parapsilosis NOT DETECTED NOT DETECTED Final   Candida tropicalis NOT DETECTED NOT DETECTED Final    Comment: Performed at [facility] Lab, [address] [place], [place] [address]  Culture, blood (routine x 2)     Status: None (Preliminary result)   Collection Time: 02/04/17  8:32 AM  Result Value Ref Range Status   Specimen Description BLOOD LEFT HAND  Final   Special Requests   Final    BOTTLES DRAWN AEROBIC AND ANAEROBIC Blood Culture adequate volume   Culture   Final    NO GROWTH 1 DAY Performed at [facility], [address], [place], [place] [address]    Report Status PENDING  Incomplete       Radiology Studies: No results found.    Scheduled Meds: . acyclovir  800 mg Oral 5 X Daily  . allopurinol  100 mg Oral BID  . colchicine  0.6 mg Oral Daily  . feeding supplement (ENSURE ENLIVE)  237 mL Oral BID BM  . folic acid  1 mg Oral Daily  . multivitamin with minerals  1 tablet Oral Daily  . pravastatin  10 mg Oral Daily  . sodium chloride flush  3 mL Intravenous Q12H  . thiamine  100 mg Oral Daily  . Warfarin - Pharmacist Dosing Inpatient   Does not apply q1800   Continuous Infusions: . sodium chloride 50 mL/hr at 02/06/17 0736  . vancomycin       LOS: 2 days    Time spent: 30 minutes   Noralee Stain, DO [facility] [email or web address] Password [email or web address] 02/06/2017, 12:30 PM

## 2017-02-06 NOTE — Progress Notes (Signed)
ANTICOAGULATION CONSULT NOTE - Follow Up Consult  Pharmacy Consult for warfarin Indication: hx atrial fibrillation  No Known Allergies  Patient Measurements: Height: 6' (182.9 cm) Weight: 289 lb 14.5 oz (131.5 kg) IBW/kg (Calculated) : 77.6 Heparin Dosing Weight:   Vital Signs: Temp: 98.3 F (36.8 C) (04/28 0457) Temp Source: Oral (04/28 0457) BP: 109/61 (04/28 0457) Pulse Rate: 88 (04/28 0457)  Labs:  Recent Labs  02/04/17 0832 02/05/17 0550 02/06/17 0535  HGB 10.0* 10.3* 10.7*  HCT 30.2* 30.9* 32.0*  PLT 209 228 242  LABPROT 45.1* 60.6* 83.4*  INR 4.65* 6.71* >10.00*  CREATININE 1.72* 1.94* 2.20*    Estimated Creatinine Clearance: 44.5 mL/min (A) (by C-G formula based on SCr of 2.2 mg/dL (H)).   Medications:  Home warfarin regimen: 5 mg daily except 2.5mg  on MWF  Assessment: Patient's a 71 y.o M with hx gout and afib on warfarin PTA, presented to the ED on 02/04/17 with c/o right wrist pain.  Warfarin resumed on admission.  INR on admission was supra-therapeuitc at 4.62  Today, 02/06/2017: - INR remains supra-therapeutic and up today >10 despite no warfarin since admit - CBC stable - No bleeding documented - On regular diet - Drug-drug intxns: abx can make patient more sensitive to warfarin   Goal of Therapy:  INR 2-3 Monitor platelets by anticoagulation protocol: Yes   Plan:  - Continue to hold warfarin today - Vitamin K per MD - Daily INR - Monitor for s/s bleeding  Loralee Pacas, PharmD, BCPS Pager: [phone] 02/06/2017,9:18 AM

## 2017-02-06 NOTE — Progress Notes (Signed)
   Will see formally tomorrow for MRSA bacteremia.  Recommend to d/c vancomycin due to his aki and switch to daptomycin 8mg /kg.  Call if questions.  Duke Salvia Drue Second MD MPH [facility] [phone]

## 2017-02-06 NOTE — Progress Notes (Signed)
Pharmacy Antibiotic Note  [name] is a 71 y.o. male admitted on 02/04/2017 with gout flare, right wrist and hand with likely overlying cellulltis. Initially started on clindamycin, then changed to vancomycin 4/27 when 1 of 2 blood cultures grew GPC clusters, BCID = MRSA. ID now following and Pharmacy has been consulted to change vancomycin to daptomycin given AKI.  Plan: Daptomycin 8mg /kg IV q24h Since BMI = 39, will use adjusted body weight 99kg for dosing (800mg  IV q24h).   Check ck now, then q7d. Monitor renal function and adjust dosing interval to q48h if CrCl falls < 30 ml/min   Height: 6' (182.9 cm) Weight: 289 lb 14.5 oz (131.5 kg) IBW/kg (Calculated) : 77.6  Temp (24hrs), Avg:98.2 F (36.8 C), Min:97.9 F (36.6 C), Max:98.4 F (36.9 C)   Recent Labs Lab 02/04/17 0832 02/05/17 0550 02/06/17 0535  WBC 12.0* 11.2* 12.6*  CREATININE 1.72* 1.94* 2.20*    Estimated Creatinine Clearance: 44.5 mL/min (A) (by C-G formula based on SCr of 2.2 mg/dL (H)).    No Known Allergies  Antimicrobials this admission:  4/26 vanc/zosyn x1 4/26 acyclovir (shingles)>> 4/26 clinda>> 4/28 4/28 vancomycin >> 4/28 4/28 daptomycin >>  Dose adjustments this admission:  ---  Microbiology results:  4/26 BCx: 1 of 2 GPC clusters (BCID = MRSA) 4/28 BCx (repeat): sent  Thank you for allowing pharmacy to be a part of this patient's care.  Loralee Pacas, PharmD, BCPS Pager: [phone] 02/06/2017 5:57 PM

## 2017-02-06 NOTE — Progress Notes (Signed)
PHARMACY - PHYSICIAN COMMUNICATION CRITICAL VALUE ALERT - BLOOD CULTURE IDENTIFICATION (BCID)  Results for orders placed or performed during the hospital encounter of 02/04/17  Blood Culture ID Panel (Reflexed) (Collected: 02/04/2017  8:30 AM)  Result Value Ref Range   Enterococcus species NOT DETECTED NOT DETECTED   Listeria monocytogenes NOT DETECTED NOT DETECTED   Staphylococcus species DETECTED (A) NOT DETECTED   Staphylococcus aureus DETECTED (A) NOT DETECTED   Methicillin resistance DETECTED (A) NOT DETECTED   Streptococcus species NOT DETECTED NOT DETECTED   Streptococcus agalactiae NOT DETECTED NOT DETECTED   Streptococcus pneumoniae NOT DETECTED NOT DETECTED   Streptococcus pyogenes NOT DETECTED NOT DETECTED   Acinetobacter baumannii NOT DETECTED NOT DETECTED   Enterobacteriaceae species NOT DETECTED NOT DETECTED   Enterobacter cloacae complex NOT DETECTED NOT DETECTED   Escherichia coli NOT DETECTED NOT DETECTED   Klebsiella oxytoca NOT DETECTED NOT DETECTED   Klebsiella pneumoniae NOT DETECTED NOT DETECTED   Proteus species NOT DETECTED NOT DETECTED   Serratia marcescens NOT DETECTED NOT DETECTED   Haemophilus influenzae NOT DETECTED NOT DETECTED   Neisseria meningitidis NOT DETECTED NOT DETECTED   Pseudomonas aeruginosa NOT DETECTED NOT DETECTED   Candida albicans NOT DETECTED NOT DETECTED   Candida glabrata NOT DETECTED NOT DETECTED   Candida krusei NOT DETECTED NOT DETECTED   Candida parapsilosis NOT DETECTED NOT DETECTED   Candida tropicalis NOT DETECTED NOT DETECTED    Name of physician (or Provider) Contacted: M. Lynch  Changes to prescribed antibiotics required: Change to vancomycin  Aleene Davidson Crowford 02/06/2017  4:11 AM

## 2017-02-06 NOTE — Progress Notes (Signed)
Pharmacy Antibiotic Note  [name] is a 71 y.o. male admitted on 02/04/2017 with cellulitis.  Pharmacy has been consulted for Vancomycin dosing.  Plan: Vancomycin 1500mg  IV every 24 hours.  Goal trough 10-15 mcg/mL.  Reduced interval after first dose to make initial dose more of a load and to time future doses a more typical administrastion time.   Height: 6' (182.9 cm) Weight: 287 lb 7.7 oz (130.4 kg) IBW/kg (Calculated) : 77.6  Temp (24hrs), Avg:97.9 F (36.6 C), Min:97.8 F (36.6 C), Max:98 F (36.7 C)   Recent Labs Lab 02/04/17 0832 02/05/17 0550  WBC 12.0* 11.2*  CREATININE 1.72* 1.94*    Estimated Creatinine Clearance: 50.2 mL/min (A) (by C-G formula based on SCr of 1.94 mg/dL (H)).    No Known Allergies  Antimicrobials this admission: Vancomycin 02/06/2017 >> Clindamycin 02/04/2017 >> 4/28  Dose adjustments this admission: -  Microbiology results: pending  Thank you for allowing pharmacy to be a part of this patient's care.  Aleene Davidson Crowford 02/06/2017 4:12 AM

## 2017-02-06 NOTE — Progress Notes (Signed)
CRITICAL VALUE ALERT  Critical value received: INR >10  Date of notification:  02/06/17  Time of notification:  0654  Critical value read back:Yes.    Nurse who received alert:  Yong Channel RN  MD notified (1st page):  MD oncall  Time of first page:  [phone]  MD notified (2nd page):  Time of second page:  Responding MD:  None  Time MD responded:  None

## 2017-02-07 ENCOUNTER — Encounter (HOSPITAL_COMMUNITY): Payer: Self-pay | Admitting: Internal Medicine

## 2017-02-07 DIAGNOSIS — N189 Chronic kidney disease, unspecified: Secondary | ICD-10-CM

## 2017-02-07 DIAGNOSIS — I482 Chronic atrial fibrillation: Secondary | ICD-10-CM

## 2017-02-07 DIAGNOSIS — N289 Disorder of kidney and ureter, unspecified: Secondary | ICD-10-CM

## 2017-02-07 DIAGNOSIS — I5033 Acute on chronic diastolic (congestive) heart failure: Secondary | ICD-10-CM

## 2017-02-07 LAB — CBC WITH DIFFERENTIAL/PLATELET
Basophils Absolute: 0 10*3/uL (ref 0.0–0.1)
Basophils Relative: 0 %
EOS PCT: 0 %
Eosinophils Absolute: 0 10*3/uL (ref 0.0–0.7)
HCT: 31.7 % — ABNORMAL LOW (ref 39.0–52.0)
Hemoglobin: 10.1 g/dL — ABNORMAL LOW (ref 13.0–17.0)
LYMPHS ABS: 0.7 10*3/uL (ref 0.7–4.0)
LYMPHS PCT: 4 %
MCH: 33 pg (ref 26.0–34.0)
MCHC: 31.9 g/dL (ref 30.0–36.0)
MCV: 103.6 fL — AB (ref 78.0–100.0)
MONO ABS: 0.5 10*3/uL (ref 0.1–1.0)
MONOS PCT: 3 %
Neutro Abs: 14.2 10*3/uL — ABNORMAL HIGH (ref 1.7–7.7)
Neutrophils Relative %: 93 %
Platelets: 260 10*3/uL (ref 150–400)
RBC: 3.06 MIL/uL — ABNORMAL LOW (ref 4.22–5.81)
RDW: 16.9 % — ABNORMAL HIGH (ref 11.5–15.5)
WBC: 15.3 10*3/uL — ABNORMAL HIGH (ref 4.0–10.5)

## 2017-02-07 LAB — AMMONIA: Ammonia: 34 umol/L (ref 9–35)

## 2017-02-07 LAB — BASIC METABOLIC PANEL
Anion gap: 12 (ref 5–15)
BUN: 77 mg/dL — AB (ref 6–20)
CO2: 24 mmol/L (ref 22–32)
Calcium: 8.3 mg/dL — ABNORMAL LOW (ref 8.9–10.3)
Chloride: 105 mmol/L (ref 101–111)
Creatinine, Ser: 2.62 mg/dL — ABNORMAL HIGH (ref 0.61–1.24)
GFR calc Af Amer: 27 mL/min — ABNORMAL LOW (ref >60)
GFR, EST NON AFRICAN AMERICAN: 23 mL/min — AB (ref >60)
GLUCOSE: 118 mg/dL — AB (ref 65–99)
POTASSIUM: 4.1 mmol/L (ref 3.5–5.1)
Sodium: 141 mmol/L (ref 135–145)

## 2017-02-07 LAB — HEPATIC FUNCTION PANEL
ALBUMIN: 2.5 g/dL — AB (ref 3.5–5.0)
ALT: 14 U/L — AB (ref 17–63)
AST: 29 U/L (ref 15–41)
Alkaline Phosphatase: 95 U/L (ref 38–126)
Bilirubin, Direct: 1.1 mg/dL — ABNORMAL HIGH (ref 0.1–0.5)
Indirect Bilirubin: 1.3 mg/dL — ABNORMAL HIGH (ref 0.3–0.9)
Total Bilirubin: 2.4 mg/dL — ABNORMAL HIGH (ref 0.3–1.2)
Total Protein: 6.7 g/dL (ref 6.5–8.1)

## 2017-02-07 LAB — BRAIN NATRIURETIC PEPTIDE: B NATRIURETIC PEPTIDE 5: 1599.6 pg/mL — AB (ref 0.0–100.0)

## 2017-02-07 LAB — PROTIME-INR
INR: 6.57 — AB
Prothrombin Time: 59.5 seconds — ABNORMAL HIGH (ref 11.4–15.2)

## 2017-02-07 LAB — GLUCOSE, CAPILLARY: Glucose-Capillary: 132 mg/dL — ABNORMAL HIGH (ref 65–99)

## 2017-02-07 MED ORDER — PIPERACILLIN-TAZOBACTAM 3.375 G IVPB
3.3750 g | Freq: Three times a day (TID) | INTRAVENOUS | Status: DC
  Administered 2017-02-07 (×2): 3.375 g via INTRAVENOUS
  Filled 2017-02-07 (×2): qty 50

## 2017-02-07 MED ORDER — VALACYCLOVIR HCL 500 MG PO TABS
500.0000 mg | ORAL_TABLET | Freq: Every day | ORAL | Status: DC
  Administered 2017-02-07 – 2017-02-08 (×2): 500 mg via ORAL
  Filled 2017-02-07 (×2): qty 1

## 2017-02-07 MED ORDER — MORPHINE SULFATE (PF) 4 MG/ML IV SOLN
1.0000 mg | INTRAVENOUS | Status: DC | PRN
  Administered 2017-02-07 – 2017-02-09 (×5): 1 mg via INTRAVENOUS
  Filled 2017-02-07 (×5): qty 1

## 2017-02-07 MED ORDER — FUROSEMIDE 10 MG/ML IJ SOLN
40.0000 mg | Freq: Two times a day (BID) | INTRAMUSCULAR | Status: DC
  Administered 2017-02-07 – 2017-02-09 (×4): 40 mg via INTRAVENOUS
  Filled 2017-02-07 (×4): qty 4

## 2017-02-07 MED ORDER — FUROSEMIDE 10 MG/ML IJ SOLN
20.0000 mg | Freq: Once | INTRAMUSCULAR | Status: AC
  Administered 2017-02-07: 20 mg via INTRAVENOUS
  Filled 2017-02-07: qty 2

## 2017-02-07 MED ORDER — DEXTROSE 5 % IV SOLN
10.0000 mg/kg | Freq: Two times a day (BID) | INTRAVENOUS | Status: DC
  Administered 2017-02-07: 985 mg via INTRAVENOUS
  Filled 2017-02-07 (×2): qty 19.7

## 2017-02-07 MED ORDER — PROMETHAZINE HCL 25 MG/ML IJ SOLN
6.2500 mg | Freq: Four times a day (QID) | INTRAMUSCULAR | Status: DC | PRN
  Administered 2017-02-09: 6.25 mg via INTRAVENOUS
  Filled 2017-02-07: qty 1

## 2017-02-07 NOTE — Consult Note (Addendum)
CARDIOLOGY CONSULT NOTE     Primary Care Physician: [facility] Referring Physician:  Dr Alvino Chapel Primary Cardiologist:  Dr Antoine Poche  Admit Date: 02/04/2017  Reason for consultation:  Diastolic CHF, afib  [name] is a 71 y.o. male with a h/o multiple chronic medical issues including chronic renal insufficiency, diastolic dysfunction, permanent atrial fibrillation, marked obesity but with hypoalbuminemia, and chronic debility now admitted with gout, acute on chronic renal insufficiency, and CHF.  Cardiology is consulted by Dr Alvino Chapel for CHF and afib.  He has had prior hospitalizations for similar issues.  Today, he is quite lethargic and unable to provide any history. He also has MRSA bacteremia for which ID has been consulted.  History is per the medical record.  He presented to [facility] long with worsening pain and redness and swelling in the right hand and his wrist without associated fevers.   He was also found to have right chest shingles and left foot ulcer.  He is chronically volume overloaded and now has acute on chronic renal failure.   Past Medical History:  Diagnosis Date  . Alcoholism (HCC)   . Anemia   . CHF (congestive heart failure) (HCC)    ejection fractio of 45% per echo in 2011 (previos EF 35-40% in 2003), echo 04/2013  -LVEF 50-55%.  . Chronic renal insufficiency   . Complication of anesthesia    April 11, 2013  . GERD (gastroesophageal reflux disease)    takes tums prn  . Gout   . Hyperlipidemia   . Hypertension   . Hypoxemia   . Obesity   . Permanent atrial fibrillation (HCC)   . Ventricular tachycardia (HCC)    nonsustained, asymptomatic   Past Surgical History:  Procedure Laterality Date  . I&D EXTREMITY Right 06/28/2013   Procedure: IRRIGATION AND DEBRIDEMENT OF RIGHT LEG WITH SURGICAL PREP/PLACEMENT OF ACELL ;  Surgeon: Wayland Denis, DO;  Location: [facility] OR;  Service: Plastics;  Laterality: Right;  . KNEE BURSECTOMY Right  04/20/2013   Procedure: Right knee prepatella bursa decompression;  Surgeon: Cammy Copa, MD;  Location: [facility] OR;  Service: Orthopedics;  Laterality: Right;  . TONSILLECTOMY      . furosemide  40 mg Intravenous BID  . sodium chloride flush  3 mL Intravenous Q12H  . Warfarin - Pharmacist Dosing Inpatient   Does not apply q1800   . acyclovir Stopped (02/07/17 1327)  . DAPTOmycin (CUBICIN)  IV Stopped (02/06/17 2331)  . piperacillin-tazobactam (ZOSYN)  IV Stopped (02/07/17 1000)    No Known Allergies  Social History   Social History  . Marital status: Single    Spouse name: N/A  . Number of children: N/A  . Years of education: N/A   Occupational History  . Not on file.   Social History Main Topics  . Smoking status: Former Smoker    Types: Cigarettes    Start date: 06/29/1987  . Smokeless tobacco: Never Used     Comment: stopped in 1985  . Alcohol use 6.0 oz/week    10 Cans of beer per week     Comment: 03/04/16 - "I have not been drunk in 20 years. I can take it or leave it. "  . Drug use: No  . Sexual activity: No   Other Topics Concern  . Not on file   Social History Narrative   Has never been married, lives with his father. Is currently disabled. Quit smoking in 1985 after two packs per day  for 10 years. Still drinks beer.     Family History  Problem Relation Age of Onset  . COPD Mother   . Hypertension Father    Medicines/ allergies reviewed  ROS- pt is unable to provide  Physical Exam: Telemetry:  Rate controlled afib Vitals:   02/06/17 2029 02/07/17 0116 02/07/17 0546 02/07/17 0600  BP:  127/72 128/66   Pulse:  99    Resp:  20 20   Temp:  98.4 F (36.9 C) 98.5 F (36.9 C)   TempSrc:  Oral Oral   SpO2: 92% 97% 97%   Weight:    285 lb 15 oz (129.7 kg)  Height:        GEN- The patient is chronically ill appearing, obtunded, unable to obtain history  Head- normocephalic, atraumatic Eyes-  Sclera clear, conjunctiva pink Ears- hearing  intact Oropharynx- clear Neck- supple, + JVD Lungs- decreased BS at bases, normal work of breathing Heart- irregular rate and rhythm  GI- soft, NT, ND, + BS Extremities- no clubbing, cyanosis, + chronic edema (depedant on arms and legs) with venous stasis changes MS- diffuse atrophy Skin- R chest with rash suggestive of shingles Psych- obtunded Neuro- obtunded  EKG tracing from 10/29/16 is personally reviewed and reveals afib.  No ekg available from this admission.  Labs:   Lab Results  Component Value Date   WBC 15.3 (H) 02/07/2017   HGB 10.1 (L) 02/07/2017   HCT 31.7 (L) 02/07/2017   MCV 103.6 (H) 02/07/2017   PLT 260 02/07/2017     Recent Labs Lab 02/07/17 0053  NA 141  K 4.1  CL 105  CO2 24  BUN 77*  CREATININE 2.62*  CALCIUM 8.3*  PROT 6.7  BILITOT 2.4*  ALKPHOS 95  ALT 14*  AST 29  GLUCOSE 118*   Lab Results  Component Value Date   CKTOTAL 60 02/06/2017   CKMB 1.7 05/14/2010   TROPONINI <0.03 10/30/2016    Lab Results  Component Value Date   CHOL 79 02/07/2015   Lab Results  Component Value Date   HDL 31 (L) 02/07/2015   Lab Results  Component Value Date   LDLCALC 37 02/07/2015   Lab Results  Component Value Date   TRIG 54 02/07/2015   Lab Results  Component Value Date   CHOLHDL 2.5 02/07/2015   No results found for: LDLDIRECT    Radiology: CT from yesterday reviewed and suggests cirrhosis/ anasarca  Echo from 1/18 is reviewed and reveals preserved EF, no WMA, moderate MR, severe atrial enlargment, PA pressure 47  ASSESSMENT AND PLAN:   1. Acute on chronic diastolic dysfunction The patient has multiple acute on chronic conditions.  Though he does likely have some component of diastolic dysfunction, he has multiple issues at play.  He presents with anasarca and low albumen which are consistent with liver disease also. (Cirrhosis is noted on CT done yesterday).  His prognosis is very poor.  Ultimately, a palliative approach may be  best.  For now, would recommend IV diuresis as you are. 2 gram sodium restriction Strict Is and Os Daily weights Not a candidate for Ace inhibitor in the setting of ARF. His EF has fluctuated from 35--55% over the past few years.  In January, EF was normal.  Plans for repeat echo are noted though I am not sure that this will change our conservative approach.  He is not a candidate for any invasive CV procedures.  2. Permanent afib supratherapeutic INR in the setting  of liver disease and ETOH Pharmacy to manage coumadin while here.  He may be a poor candidate for long term anticoagulation.  chads3vasc score is at least 3. Current rate controlled Not a candidate for rhythm control given severe LA enlargement  3. HTN Stable No change required today  4. AMS Will check ammonia level Primary team to otherwise manage Would avoid morphine/ sedating medicines as ablve  5. Acute on chronic renal failure May be hepatorenal syndrome Primary team to manage Consider nephrology evaluate if worsens further  6. MRSA bacteremia ID has been consulted Echo is pending Multiple risk factors for infection  His prognosis is very poor.  Ultimately, a palliative approach may be best.  Not a candidate for invasive CV procedures.  Very complicated patient.  A high level of decision making was required for this encounter including review of multiple prior epic records.   Hillis Range, MD 02/07/2017  1:42 PM

## 2017-02-07 NOTE — Progress Notes (Signed)
CRITICAL VALUE ALERT  Critical value received:  5 runs of VTACH on Tele  Date of notification:  02/07/17  Time of notification:  0956  Critical value read back yes   Nurse who received alert:  Pearson Grippe  MD notified (1st page):    Time of first page:  [phone]  MD notified (2nd page):  Time of second page:  Responding MD:  Alvino Chapel  Time MD responded:  1030

## 2017-02-07 NOTE — Progress Notes (Signed)
Pharmacy Antibiotic Note  [name] is a 71 y.o. male admitted on 02/04/2017 with pneumonia.  Pharmacy has been consulted for zosyn dosing.  Plan: Zosyn 3.375g IV q8h (4 hour infusion).  Height: 6' (182.9 cm) Weight: 289 lb 14.5 oz (131.5 kg) IBW/kg (Calculated) : 77.6  Temp (24hrs), Avg:98.3 F (36.8 C), Min:98.3 F (36.8 C), Max:98.4 F (36.9 C)   Recent Labs Lab 02/04/17 0832 02/05/17 0550 02/06/17 0535  WBC 12.0* 11.2* 12.6*  CREATININE 1.72* 1.94* 2.20*    Estimated Creatinine Clearance: 44.5 mL/min (A) (by C-G formula based on SCr of 2.2 mg/dL (H)).    No Known Allergies  Antimicrobials this admission: 4/26 vanc/zosyn x1 4/26 acyclovir (shingles)>> 4/26 clinda>> 4/28 4/28 vancomycin >> 4/28 4/28 daptomycin >> 4/29 zosyn >>  Dose adjustments this admission: -  Microbiology results: 4/26 BCx: 1 of 2 GPC clusters (BCID = MRSA) 4/28 BCx (repeat): sent  Thank you for allowing pharmacy to be a part of this patient's care.  Aleene Davidson Crowford 02/07/2017 12:09 AM

## 2017-02-07 NOTE — Progress Notes (Signed)
CRITICAL VALUE ALERT  Critical value received:  INR 6.57  Date of notification:  02/07/17  Time of notification:  0127  Critical value read back:Yes.    Nurse who received alert:  Linward Natal, RN  MD notified (1st page):  Arvilla Market  Time of first page:  [phone]  MD notified (2nd page):  Time of second page:  Responding MD: Arvilla Market  Time MD responded:  [phone]

## 2017-02-07 NOTE — Progress Notes (Signed)
ANTICOAGULATION CONSULT NOTE - Follow Up Consult  Pharmacy Consult for warfarin Indication: hx atrial fibrillation  No Known Allergies  Patient Measurements: Height: 6' (182.9 cm) Weight: 285 lb 15 oz (129.7 kg) IBW/kg (Calculated) : 77.6 Heparin Dosing Weight:   Vital Signs: Temp: 98.5 F (36.9 C) (04/29 0546) Temp Source: Oral (04/29 0546) BP: 128/66 (04/29 0546) Pulse Rate: 99 (04/29 0116)  Labs:  Recent Labs  02/05/17 0550 02/06/17 0535 02/06/17 1806 02/07/17 0053  HGB 10.3* 10.7*  --  10.1*  HCT 30.9* 32.0*  --  31.7*  PLT 228 242  --  260  LABPROT 60.6* 83.4*  --  59.5*  INR 6.71* >10.00*  --  6.57*  CREATININE 1.94* 2.20*  --  2.62*  CKTOTAL  --   --  60  --     Estimated Creatinine Clearance: 37 mL/min (A) (by C-G formula based on SCr of 2.62 mg/dL (H)).   Medications:  Home warfarin regimen: 5 mg daily except 2.5mg  on MWF  Assessment: Patient's a 71 y.o M with hx gout and afib on warfarin PTA, presented to the ED on 02/04/17 with c/o right wrist pain.  Warfarin resumed on admission.  INR on admission was supra-therapeuitc at 4.62  Today, 02/07/2017: - INR remains supra-therapeutic at 6.57, decreased after vitamin K 10mg  SQ yesterday - CBC stable - No bleeding documented - On regular diet - Drug-drug intxns: abx can make patient more sensitive to warfarin   Goal of Therapy:  INR 2-3 Monitor platelets by anticoagulation protocol: Yes   Plan:  - Continue to hold warfarin today - Daily INR - Monitor for s/s bleeding  Loralee Pacas, PharmD, BCPS Pager: [phone] 02/07/2017,8:49 AM

## 2017-02-07 NOTE — Progress Notes (Addendum)
PROGRESS NOTE    [name]  ZOX:[id number] DOB: 1945-12-25 DOA: 02/04/2017 PCP: [facility]     Brief Narrative:  [name] is a 71 y.o. male with medical history significant for gout, chronic diastolic CHF, last 2-D echo in January 2018 showed ejection fraction 55% and grade 3 diastolic dysfunction, dyslipidemia, atrial fibrillation on anticoagulation with Coumadin. Patient presented to [facility] long with worsening pain and redness and swelling in the right hand and his wrist. He reports taking allopurinol as prescribed and has not had gout flareup in a while but over past 3 days he was trying to get up from the bed and he mostly uses a right hand and this time the pain was getting worse and he could not get himself up. Patient reports no fevers. No reports of chills. He does have a rash over his right shoulder and chest but reports no pain over that area. He is not sure for how long he has had about a rash that has been there for at least a week or so. He was admitted for further treatment of his right hand gout, right chest shingles, and left foot ulcer.  Assessment & Plan:   Active Problems:   Gout flare   Pressure injury of skin   Malnutrition of moderate degree   Gout flare, right wrist and hand with likely overlying cellulitis - Resolved. Will hold allopurinol and colchicine now in setting of worsening kidney function    MRSA bacteremia - Repeat blood cultures pending - Daptomycin - ID consult   Shingles over right chest and right shoulder  - Airborne precaution - Acyclovir x 7 days   Acute on chronic diastolic CHF - 2-D echo in January 2018 showed ejection fraction of 55% with grade 3 diastolic dysfunction - Worsening edema, anasarca, SOB, now requiring [place] O2, BNP 1599  - Lasix 40mg  IV BID (takes 40mg  PO BID)  - Echo  - Cardiology consult  - Strict I/Os, daily weight   Acute hypoxemic respiratory failure - Combination of CHF and  possibly aspiration - [place] O2 - Zosyn added to cover aspiration, SLP when ileus resolves and able to tolerate oral   Paralytic ileus - Vomiting yesterday, AXR and CT abdomen obtained which showed ileus without obstruction - NPO - Antiemetics  AKI on CKD stage 3 - Baseline creatinine 2017 was 1.3 - Renal US without hydronephrosis - Stop IVF in setting of worsening edema  - Trend BMP closely    Supratherapeutic INR - Vit K 10mg  on 4/28  - Continue to hold coumadin - Daily INR  Left foot ulcer - Does not appear grossly infected  - Appreciate wound consultation  Asymptomatic pyuria - No dysuria per report   Dyslipidemia - Holding pravachol in setting of daptomycin use   Atrial fibrillation - CHADS vasc score at least 2 - On anticoagulation with Coumadin; INR supratherapeutic so Coumadin will be held  - Currently regular rhythm   Chronic venous stasis bilaterally in lower extremities - Appreciate wound care recommendations   DVT prophylaxis: holding coumadin due to supratherapeutic INR Code Status: Full Family Communication: No family at bedside Disposition Plan: SNF    Consultants:   ID  Cardiology   Procedures:   None  Antimicrobials:  Anti-infectives    Start     Dose/Rate Route Frequency Ordered Stop   02/07/17 1000  acyclovir (ZOVIRAX) 985 mg in dextrose 5 % 150 mL IVPB     10 mg/kg  98.4 kg (Adjusted) 169.7 mL/hr over 60 Minutes Intravenous Every 12 hours 02/07/17 0813     02/07/17 0015  piperacillin-tazobactam (ZOSYN) IVPB 3.375 g     3.375 g 12.5 mL/hr over 240 Minutes Intravenous Every 8 hours 02/07/17 0008     02/06/17 2200  vancomycin (VANCOCIN) 1,250 mg in sodium chloride 0.9 % 250 mL IVPB  Status:  Discontinued     1,250 mg 166.7 mL/hr over 90 Minutes Intravenous Every 24 hours 02/06/17 0406 02/06/17 0413   02/06/17 2200  vancomycin (VANCOCIN) 1,500 mg in sodium chloride 0.9 % 500 mL IVPB  Status:  Discontinued     1,500 mg 250 mL/hr over  120 Minutes Intravenous Daily at bedtime 02/06/17 0413 02/06/17 1745   02/06/17 2200  DAPTOmycin (CUBICIN) 800 mg in sodium chloride 0.9 % IVPB     800 mg 232 mL/hr over 30 Minutes Intravenous Every 24 hours 02/06/17 1806     02/06/17 0415  vancomycin (VANCOCIN) 1,500 mg in sodium chloride 0.9 % 500 mL IVPB     1,500 mg 250 mL/hr over 120 Minutes Intravenous  Once 02/06/17 0406 02/06/17 0703   02/05/17 0615  acyclovir (ZOVIRAX) tablet 800 mg  Status:  Discontinued     800 mg Oral 5 times daily 02/05/17 0613 02/07/17 0813   02/04/17 1400  clindamycin (CLEOCIN) IVPB 600 mg  Status:  Discontinued     600 mg 100 mL/hr over 30 Minutes Intravenous Every 8 hours 02/04/17 1027 02/06/17 0358   02/04/17 1045  acyclovir (ZOVIRAX) 200 MG capsule 800 mg  Status:  Discontinued     800 mg Oral 5 times daily 02/04/17 1036 02/05/17 0614   02/04/17 0745  vancomycin (VANCOCIN) IVPB 1000 mg/200 mL premix     1,000 mg 200 mL/hr over 60 Minutes Intravenous  Once 02/04/17 0736 02/04/17 1022   02/04/17 0745  piperacillin-tazobactam (ZOSYN) IVPB 3.375 g     3.375 g 100 mL/hr over 30 Minutes Intravenous  Once 02/04/17 0736 02/04/17 0915         Subjective: Patient states that he is feeling better. He had more episodes of vomiting but reports relief each time he vomits. He has no complaints today. Denies any chest pain, shortness of breath, abdominal pain. Last BM was 2 days ago but is passing gas.    Objective: Vitals:   02/06/17 2029 02/07/17 0116 02/07/17 0546 02/07/17 0600  BP:  127/72 128/66   Pulse:  99    Resp:  20 20   Temp:  98.4 F (36.9 C) 98.5 F (36.9 C)   TempSrc:  Oral Oral   SpO2: 92% 97% 97%   Weight:    129.7 kg (285 lb 15 oz)  Height:        Intake/Output Summary (Last 24 hours) at 02/07/17 1003 Last data filed at 02/07/17 1610  Gross per 24 hour  Intake          1119.33 ml  Output              550 ml  Net           569.33 ml   Filed Weights   02/05/17 0555 02/06/17 0457  02/07/17 0600  Weight: 130.4 kg (287 lb 7.7 oz) 131.5 kg (289 lb 14.5 oz) 129.7 kg (285 lb 15 oz)    Examination:  General exam: Appears calm and comfortable  Respiratory system: Clear to auscultation, diminished bases. Respiratory effort normal. On  O2  Cardiovascular system: S1 &  S2 heard, RRR. No JVD, murmurs, rubs, gallops or clicks. +anasarca  Gastrointestinal system: Abdomen is nondistended, soft and nontender to palpation  Central nervous system: Alert and oriented. No focal neurological deficits. Extremities: right wrist and hand gout has significantly improved in appearance, much less edema and no erythema  Skin: +crusting vesicular rash right chest, right shoulder, right upper back, +ulcer on plantar aspect left foot, +bilateral chronic venous stasis  Psychiatry: Judgement and insight appear normal. Mood & affect appropriate.   Data Reviewed: I have personally reviewed following labs and imaging studies  CBC:  Recent Labs Lab 02/04/17 0832 02/05/17 0550 02/06/17 0535 02/07/17 0053  WBC 12.0* 11.2* 12.6* 15.3*  NEUTROABS 10.6*  --  11.2* 14.2*  HGB 10.0* 10.3* 10.7* 10.1*  HCT 30.2* 30.9* 32.0* 31.7*  MCV 102.0* 101.0* 100.9* 103.6*  PLT 209 228 242 260   Basic Metabolic Panel:  Recent Labs Lab 02/04/17 0832 02/05/17 0550 02/06/17 0535 02/07/17 0053  NA 141 142 141 141  K 3.7 3.6 3.8 4.1  CL 106 106 106 105  CO2 23 25 24 24   GLUCOSE 70 91 103* 118*  BUN 55* 63* 66* 77*  CREATININE 1.72* 1.94* 2.20* 2.62*  CALCIUM 8.4* 8.4* 8.3* 8.3*   GFR: Estimated Creatinine Clearance: 37 mL/min (A) (by C-G formula based on SCr of 2.62 mg/dL (H)). Liver Function Tests:  Recent Labs Lab 02/07/17 0053  AST 29  ALT 14*  ALKPHOS 95  BILITOT 2.4*  PROT 6.7  ALBUMIN 2.5*   No results for input(s): LIPASE, AMYLASE in the last 168 hours. No results for input(s): AMMONIA in the last 168 hours. Coagulation Profile:  Recent Labs Lab 02/04/17 0832 02/05/17 0550  02/06/17 0535 02/07/17 0053  INR 4.65* 6.71* >10.00* 6.57*   Cardiac Enzymes:  Recent Labs Lab 02/06/17 1806  CKTOTAL 60   BNP (last 3 results) No results for input(s): PROBNP in the last 8760 hours. HbA1C: No results for input(s): HGBA1C in the last 72 hours. CBG:  Recent Labs Lab 02/05/17 0750 02/06/17 0806 02/07/17 0743  GLUCAP 92 94 132*   Lipid Profile: No results for input(s): CHOL, HDL, LDLCALC, TRIG, CHOLHDL, LDLDIRECT in the last 72 hours. Thyroid Function Tests:  Recent Labs  02/04/17 1434  TSH 1.157   Anemia Panel: No results for input(s): VITAMINB12, FOLATE, FERRITIN, TIBC, IRON, RETICCTPCT in the last 72 hours. Sepsis Labs: No results for input(s): PROCALCITON, LATICACIDVEN in the last 168 hours.  Recent Results (from the past 240 hour(s))  Culture, blood (routine x 2)     Status: Abnormal (Preliminary result)   Collection Time: 02/04/17  8:30 AM  Result Value Ref Range Status   Specimen Description BLOOD LEFT FOREARM  Final   Special Requests   Final    BOTTLES DRAWN AEROBIC AND ANAEROBIC Blood Culture adequate volume   Culture  Setup Time   Final    ANAEROBIC BOTTLE ONLY GRAM POSITIVE COCCI IN CLUSTERS TO JGRIMSLEY(PHARMd) BY TCLEVELAND 02/05/2017 AT 3:53AM    Culture (A)  Final    STAPHYLOCOCCUS AUREUS SUSCEPTIBILITIES TO FOLLOW Performed at [facility], [address] [place], [place] [address]    Report Status PENDING  Incomplete  Blood Culture ID Panel (Reflexed)     Status: Abnormal   Collection Time: 02/04/17  8:30 AM  Result Value Ref Range Status   Enterococcus species NOT DETECTED NOT DETECTED Final   Listeria monocytogenes NOT DETECTED NOT DETECTED Final   Staphylococcus species DETECTED (A) NOT  DETECTED Final    Comment: CRITICAL RESULT CALLED TO, READ BACK BY AND VERIFIED WITH: TO TO JGRIMSLEY(PHARMd0 BY TCLEVELAND 03/07/2017 AT 3:53AM    Staphylococcus aureus DETECTED (A) NOT DETECTED Final    Comment: Methicillin  (oxacillin)-resistant Staphylococcus aureus (MRSA). MRSA is predictably resistant to beta-lactam antibiotics (except ceftaroline). Preferred therapy is vancomycin unless clinically contraindicated. Patient requires contact precautions if  hospitalized. CRITICAL RESULT CALLED TO, READ BACK BY AND VERIFIED WITH: TO TO JGRIMSLEY(PHARMd0 BY [facility] 03/07/2017 AT 3:53AM    Methicillin resistance DETECTED (A) NOT DETECTED Final    Comment: CRITICAL RESULT CALLED TO, READ BACK BY AND VERIFIED WITH: TO TO JGRIMSLEY(PHARMd0 BY TCLEVELAND 03/07/2017 AT 3:53AM    Streptococcus species NOT DETECTED NOT DETECTED Final   Streptococcus agalactiae NOT DETECTED NOT DETECTED Final   Streptococcus pneumoniae NOT DETECTED NOT DETECTED Final   Streptococcus pyogenes NOT DETECTED NOT DETECTED Final   Acinetobacter baumannii NOT DETECTED NOT DETECTED Final   Enterobacteriaceae species NOT DETECTED NOT DETECTED Final   Enterobacter cloacae complex NOT DETECTED NOT DETECTED Final   Escherichia coli NOT DETECTED NOT DETECTED Final   Klebsiella oxytoca NOT DETECTED NOT DETECTED Final   Klebsiella pneumoniae NOT DETECTED NOT DETECTED Final   Proteus species NOT DETECTED NOT DETECTED Final   Serratia marcescens NOT DETECTED NOT DETECTED Final   Haemophilus influenzae NOT DETECTED NOT DETECTED Final   Neisseria meningitidis NOT DETECTED NOT DETECTED Final   Pseudomonas aeruginosa NOT DETECTED NOT DETECTED Final   Candida albicans NOT DETECTED NOT DETECTED Final   Candida glabrata NOT DETECTED NOT DETECTED Final   Candida krusei NOT DETECTED NOT DETECTED Final   Candida parapsilosis NOT DETECTED NOT DETECTED Final   Candida tropicalis NOT DETECTED NOT DETECTED Final    Comment: Performed at [facility], [address], [place], [place] [address]  Culture, blood (routine x 2)     Status: None (Preliminary result)   Collection Time: 02/04/17  8:32 AM  Result Value Ref Range Status   Specimen Description  BLOOD LEFT HAND  Final   Special Requests   Final    BOTTLES DRAWN AEROBIC AND ANAEROBIC Blood Culture adequate volume   Culture   Final    NO GROWTH 2 DAYS Performed at [facility], [address], [place], [place] [address]    Report Status PENDING  Incomplete  Culture, blood (routine x 2)     Status: None (Preliminary result)   Collection Time: 02/06/17 11:04 AM  Result Value Ref Range Status   Specimen Description   Final    BLOOD RIGHT FOREARM Performed at [facility], [address] [place], [place] [address]    Special Requests   Final    BOTTLES DRAWN AEROBIC AND ANAEROBIC Blood Culture adequate volume   Culture PENDING  Incomplete   Report Status PENDING  Incomplete       Radiology Studies: Ct Abdomen Pelvis Wo Contrast  Result Date: 02/07/2017 CLINICAL DATA:  Evaluate for ileus. Nausea and vomiting. History of alcoholism, sepsis. EXAM: CT ABDOMEN AND PELVIS WITHOUT CONTRAST TECHNIQUE: Multidetector CT imaging of the abdomen and pelvis was performed following the standard protocol without IV contrast. COMPARISON:  Abdominal radiograph February 06, 2017 and CT pelvis November 25, 2016 FINDINGS: LOWER CHEST: Small RIGHT and moderate LEFT pleural effusions. RIGHT middle and RIGHT lower lobe consolidation with air bronchograms in centrilobular ground-glass nodules. LEFT lower lobe versus pneumonia. The heart is moderately enlarged. Moderate coronary artery calcifications. No  pericardial effusion. HEPATOBILIARY: Slightly nodular liver compatible with cirrhosis. Small gallstones without CT findings of acute cholecystitis. PANCREAS: Atrophic pancreas. SPLEEN: Normal. ADRENALS/URINARY TRACT: Kidneys are orthotopic, demonstrating normal size and morphology. No nephrolithiasis, hydronephrosis; limited assessment for renal masses on this nonenhanced examination. The unopacified ureters are normal in course and caliber. Urinary bladder is partially distended and unremarkable.  Thickened adrenal glands most compatible with hyperplasia. STOMACH/BOWEL: Contrast distended stomach. Multiple loops of mildly prominent small and large bowel with air-fluid levels. VASCULAR/LYMPHATIC: Enlarged inferior vena cava compatible with RIGHT heart failure. Aortoiliac vessels are normal in course and caliber, mild calcific atherosclerosis REPRODUCTIVE: Normal. OTHER: Presacral fat stranding most compatible with edema, generalized mesenteric edema and small volume ascites. MUSCULOSKELETAL: Severe anasarca. Subacute LEFT posterior fifth through eleventh rib fractures. Gynecomastia. Severe RIGHT and moderate to severe LEFT hip osteoarthrosis. Old LEFT inferior pubic ramus fracture. Scoliosis. Multilevel severe degenerative change of the lumbar spine. IMPRESSION: Ileus, no bowel obstruction. Findings of severe RIGHT heart failure and mild cirrhosis resulting in severe anasarca, small volume ascites and mesenteric edema. Small RIGHT pleural effusion with RIGHT lung base pneumonia. Small to moderate LEFT pleural effusion with atelectasis versus pneumonia. Electronically Signed   By: Awilda Metro M.D.   On: 02/07/2017 01:37   US Renal  Result Date: 02/06/2017 CLINICAL DATA:  Acute renal injury. EXAM: RENAL / URINARY TRACT ULTRASOUND COMPLETE COMPARISON:  None. FINDINGS: Right Kidney: Length: 10.6 cm. Echogenicity within normal limits. No mass or hydronephrosis visualized. Left Kidney: Length: 11.4 cm. Visualization is somewhat limited but no abnormalities are seen. Bladder: Not visualized. IMPRESSION: 1. The left kidney was poorly visualized but grossly unremarkable. The right kidney is normal in appearance. The bladder was not visualized. Ascites near the right lobe of the liver is incidentally noted. Electronically Signed   By: Gerome Sam III M.D   On: 02/06/2017 15:54   Dg Abd Acute W/chest  Result Date: 02/06/2017 CLINICAL DATA:  Low oxygen saturation. Vomiting. X 1 day. Hx afib,  cardiomyopathy, CHF, GERD, gout, htn, obesity, ventricular tachycardia EXAM: DG ABDOMEN ACUTE W/ 1V CHEST COMPARISON:  11/25/2016 FINDINGS: There are gas-filled loops of small and large bowel without significant dilation. There are few air-fluid levels on the decubitus view. Findings suggest an adynamic ileus. There is no free air. The soft tissues are not well visualized due to the overlying bowel gas. Frontal chest radiograph demonstrates mild enlargement of cardiac silhouette. There is perihilar and medial lung base opacity, greater on the left. The right medial lung base opacity has increased from the prior study. Possible left pleural effusion. No evidence a right pleural effusion. No pneumothorax. Skeletal structures are diffusely demineralized. There are degenerative changes throughout the visualized spine with several vertebral compression fractures presumed old. IMPRESSION: 1. Gas-filled bowel without significant dilation, but with a few air-fluid levels on the decubitus view. This suggests an adynamic ileus. No convincing obstruction. No free air. 2. Perihilar and left greater than right lung base opacity. Findings may reflect mild pulmonary edema with lung base atelectasis. Pneumonia is possible. Electronically Signed   By: Amie Portland M.D.   On: 02/06/2017 22:06      Scheduled Meds: . sodium chloride flush  3 mL Intravenous Q12H  . Warfarin - Pharmacist Dosing Inpatient   Does not apply q1800   Continuous Infusions: . acyclovir    . DAPTOmycin (CUBICIN)  IV Stopped (02/06/17 2331)  . piperacillin-tazobactam (ZOSYN)  IV Stopped (02/07/17 1000)     LOS: 3 days  Time spent: 30 minutes   Noralee Stain, DO [facility] [email or web address] Password [email or web address] 02/07/2017, 10:03 AM

## 2017-02-07 NOTE — Progress Notes (Signed)
Pharmacy Antibiotic Note  [name] is a 71 y.o. male admitted on 02/04/2017 with gout flare, right wrist and hand with likely overlying cellulitis. Also with shingles over right chest and should for which he was started on acyclovir 800mg  PO 5 times per day on 4/26. Pharmacy has now been consulted to transition to IV now that NPO due to ileus.  Plan: Acyclovir 985mg  IV q12h 10mg /kg adjusted body weight, q12h interval for CrCl 30-50 ml/min Monitor renal function and adjust as needed Today is day #4 total therapy, f/u duration   Height: 6' (182.9 cm) Weight: 285 lb 15 oz (129.7 kg) IBW/kg (Calculated) : 77.6  Temp (24hrs), Avg:98.4 F (36.9 C), Min:98.3 F (36.8 C), Max:98.5 F (36.9 C)   Recent Labs Lab 02/04/17 0832 02/05/17 0550 02/06/17 0535 02/07/17 0053  WBC 12.0* 11.2* 12.6* 15.3*  CREATININE 1.72* 1.94* 2.20* 2.62*    Estimated Creatinine Clearance: 37 mL/min (A) (by C-G formula based on SCr of 2.62 mg/dL (H)).    No Known Allergies   Thank you for allowing pharmacy to be a part of this patient's care.  Loralee Pacas, PharmD, BCPS Pager: [phone] 02/07/2017 8:20 AM

## 2017-02-07 NOTE — Progress Notes (Signed)
Around 2030, patient's vital signs were taken and patient's oxygen sat was in the low 80's on RA. Patient did not complain of any SOB and RN put patient on 3L were oxygen sat came up to 92%. Patient had also been having a lot of nausea and a few episodes of emesis. Patient was given nausea medicine and on call MD was notified. New orders were given to get an chest/abd xray. Results came back and MD was notified. New orders then put in for a CT abd and pelvis. When results came back, oncall was notified. Also, MD notified of BNP being 1599.6. New orders were given to discontinue fluids and give a one time dose of IV lasix. Also, MD put in orders for IV zosyn.

## 2017-02-07 NOTE — Consult Note (Addendum)
[facility]  Total days of antibiotics 4        Day 2 daptomycin, day 4 acyclovir               Reason for Consult: MRSA bacteremia    Referring Physician: Maylene Roes  Active Problems:   Gout flare   Pressure injury of skin   Malnutrition of moderate degree    HPI: [name] is a 71 y.o. male with history of chronic diastolic HF, HLD, alcoholism-related cirrhosis, AFib on coumadin, hx of gout who was admitted on 4/26 for hx of 3 days of right wrist pain and left great toe pain. He also reported having a rash over right shoulder and chest for roughly 1 week. No fever, chills, nightsweats. In the ED he was noted to have leukocytosis of wbc 12K, xray of wrist shows diffuse swelling. But also has small ulceration-non draining to 3rd MT of left foot and vesicular rash to right side of chest concerning for zoster on exam. He denies prior outbreaks of shingles. Takes daily allopurinol and is unclear what triggered his most recent episode. He feels that his great toe is better which is often involved, and also has improvement in his right wrist. He was admitted for gout flare of right wrist with possible secondary cellulitis which he was started on clindamycin plus piptazo in addition to He was also started on acyclovir for shingles and allopurinol and colchicine to treat gouty flare. Infectious work up showed 1 of 4 bottles with MRSA on 4/26. Repeat blood cx on 4/28 are ngtd but he has been on abtx. His gout is improved though he has developed some aki - possibly due to vanco/piptazo combo, allopurinol plus colchicine. His hospitalization was complicated by ileus for which he had abd CT evaluate his N/V with findings showing   Findings of severe RIGHT heart failure and mild cirrhosis resulting in severe anasarca, small volume ascites and mesenteric edema.  Small RIGHT pleural effusion with RIGHT lung base pneumonia. Small to moderate LEFT pleural effusion with  atelectasis versus pneumonia.  Past Medical History:  Diagnosis Date  . Alcoholism ([facility])   . Anemia   . Atrial fibrillation or flutter   . Cardiomyopathy    nonischemic  . CHF (congestive heart failure) (HCC)    ejection fractio of 45% per echo in 2011 (previos EF 35-40% in 2003), echo 04/2013  -LVEF 50-55%.  . Complication of anesthesia    April 11, 2013  . GERD (gastroesophageal reflux disease)    takes tums prn  . Gout   . Hyperlipidemia   . Hypertension   . Hypoxemia   . Obesity   . Ventricular tachycardia (HCC)    nonsustained, asymptomatic    Allergies: No Known Allergies   MEDICATIONS: . furosemide  40 mg Intravenous BID  . sodium chloride flush  3 mL Intravenous Q12H  . Warfarin - Pharmacist Dosing Inpatient   Does not apply q1800    Social History  Substance Use Topics  . Smoking status: Former Smoker    Types: Cigarettes    Start date: 06/29/1987  . Smokeless tobacco: Never Used     Comment: stopped in 1985  . Alcohol use 6.0 oz/week    10 Cans of beer per week     Comment: 03/04/16 - "I have not been drunk in 20 years. I can take it or leave it. "    Family History  Problem Relation Age of Onset  .  COPD Mother   . Hypertension Father      Review of Systems  Constitutional: Negative for fever, chills, diaphoresis, activity change, appetite change, fatigue and unexpected weight change.  HENT: Negative for congestion, sore throat, rhinorrhea, sneezing, trouble swallowing and sinus pressure.  Eyes: Negative for photophobia and visual disturbance.  Respiratory: Negative for cough, chest tightness, shortness of breath, wheezing and stridor.  Cardiovascular: Negative for chest pain, palpitations and leg swelling.  Gastrointestinal: Negative for nausea, vomiting, abdominal pain, diarrhea, constipation, blood in stool, abdominal distention and anal bleeding.  Genitourinary: Negative for dysuria, hematuria, flank pain and difficulty urinating.  Musculoskeletal:  right wrist and left foot pain Skin: Negative for color change, pallor, rash and wound.  Neurological: Negative for dizziness, tremors, weakness and light-headedness.  Hematological: Negative for adenopathy. Does not bruise/bleed easily.  Psychiatric/Behavioral: Negative for behavioral problems, confusion, sleep disturbance, dysphoric mood, decreased concentration and agitation.     OBJECTIVE: Temp:  [98.3 F (36.8 C)-98.5 F (36.9 C)] 98.5 F (36.9 C) (04/29 0546) Pulse Rate:  [94-102] 99 (04/29 0116) Resp:  [20] 20 (04/29 0546) BP: (118-136)/(66-72) 128/66 (04/29 0546) SpO2:  [92 %-97 %] 97 % (04/29 0546) Weight:  [285 lb 15 oz (129.7 kg)] 285 lb 15 oz (129.7 kg) (04/29 0600) Physical Exam  Constitutional: He is oriented to person, place, and time. He appears malnourished, pale, No distress.  HENT:  Mouth/Throat: Oropharynx is clear and moist. No oropharyngeal exudate. Poor dentition Cardiovascular: distant hear sounds.Normal rate, regular rhythm and normal heart sounds. Exam reveals no gallop and no friction rub.  No murmur heard.  Pulmonary/Chest: Effort normal and breath sounds normal. No respiratory distress. He has no wheezes.  Abdominal: Soft.large pannus Bowel sounds are normal. He exhibits no distension. There is no tenderness.  Lymphadenopathy:  He has no cervical adenopathy.  Neurological: He is alert and oriented to person, place, and time.  Skin: vesicular rash to right shoulder chest wall, and back of neck, does not cross midline, erythematous base to vesicles,  Ext: hyperpigmentation, no pitting edema,  valgus deformation of right great toe with plantar ulcer to 3rd MTH. + crepitus to right elbow though no erythema to right elbow. Diffuse anasarca to upper extremities Psychiatric: He has a normal mood and affect. His behavior is normal.     LABS: Results for orders placed or performed during the hospital encounter of 02/04/17 (from the past 48 hour(s))    Protime-INR     Status: Abnormal   Collection Time: 02/06/17  5:35 AM  Result Value Ref Range   Prothrombin Time 83.4 (H) 11.4 - 15.2 seconds   INR >10.00 (HH)     Comment: RESULT REPEATED AND VERIFIED SPECIMEN CHECKED FOR CLOTS CRITICAL RESULT CALLED TO, READ BACK BY AND VERIFIED WITH: Roger Kill RN @ [phone] ON 02/06/17 BY C DAVIS   Basic metabolic panel     Status: Abnormal   Collection Time: 02/06/17  5:35 AM  Result Value Ref Range   Sodium 141 135 - 145 mmol/L   Potassium 3.8 3.5 - 5.1 mmol/L   Chloride 106 101 - 111 mmol/L   CO2 24 22 - 32 mmol/L   Glucose, Bld 103 (H) 65 - 99 mg/dL   BUN 66 (H) 6 - 20 mg/dL   Creatinine, Ser 2.20 (H) 0.61 - 1.24 mg/dL   Calcium 8.3 (L) 8.9 - 10.3 mg/dL   GFR calc non Af Amer 29 (L) >60 mL/min   GFR calc Af Amer 33 (L) >60  mL/min    Comment: (NOTE) The eGFR has been calculated using the CKD EPI equation. This calculation has not been validated in all clinical situations. eGFR's persistently <60 mL/min signify possible Chronic Kidney Disease.    Anion gap 11 5 - 15  CBC with Differential/Platelet     Status: Abnormal   Collection Time: 02/06/17  5:35 AM  Result Value Ref Range   WBC 12.6 (H) 4.0 - 10.5 K/uL   RBC 3.17 (L) 4.22 - 5.81 MIL/uL   Hemoglobin 10.7 (L) 13.0 - 17.0 g/dL   HCT 32.0 (L) 39.0 - 52.0 %   MCV 100.9 (H) 78.0 - 100.0 fL   MCH 33.8 26.0 - 34.0 pg   MCHC 33.4 30.0 - 36.0 g/dL   RDW 16.8 (H) 11.5 - 15.5 %   Platelets 242 150 - 400 K/uL   Neutrophils Relative % 89 %   Neutro Abs 11.2 (H) 1.7 - 7.7 K/uL   Lymphocytes Relative 8 %   Lymphs Abs 1.0 0.7 - 4.0 K/uL   Monocytes Relative 3 %   Monocytes Absolute 0.4 0.1 - 1.0 K/uL   Eosinophils Relative 0 %   Eosinophils Absolute 0.0 0.0 - 0.7 K/uL   Basophils Relative 0 %   Basophils Absolute 0.0 0.0 - 0.1 K/uL  Glucose, capillary     Status: None   Collection Time: 02/06/17  8:06 AM  Result Value Ref Range   Glucose-Capillary 94 65 - 99 mg/dL  Culture, blood  (routine x 2)     Status: None (Preliminary result)   Collection Time: 02/06/17 11:04 AM  Result Value Ref Range   Specimen Description      BLOOD RIGHT FOREARM Performed at [facility], [address] [place], [place] [address]    Special Requests      BOTTLES DRAWN AEROBIC AND ANAEROBIC Blood Culture adequate volume   Culture PENDING    Report Status PENDING   CK     Status: None   Collection Time: 02/06/17  6:06 PM  Result Value Ref Range   Total CK 60 49 - 397 U/L  Protime-INR     Status: Abnormal   Collection Time: 02/07/17 12:53 AM  Result Value Ref Range   Prothrombin Time 59.5 (H) 11.4 - 15.2 seconds   INR 6.57 (HH)     Comment: RESULT REPEATED AND VERIFIED SPECIMEN CHECKED FOR CLOTS CRITICAL RESULT CALLED TO, READ BACK BY AND VERIFIED WITH: L HUDSON RN @ 0127 ON 02/07/17 BY C DAVIS   Basic metabolic panel     Status: Abnormal   Collection Time: 02/07/17 12:53 AM  Result Value Ref Range   Sodium 141 135 - 145 mmol/L   Potassium 4.1 3.5 - 5.1 mmol/L   Chloride 105 101 - 111 mmol/L   CO2 24 22 - 32 mmol/L   Glucose, Bld 118 (H) 65 - 99 mg/dL   BUN 77 (H) 6 - 20 mg/dL   Creatinine, Ser 2.62 (H) 0.61 - 1.24 mg/dL   Calcium 8.3 (L) 8.9 - 10.3 mg/dL   GFR calc non Af Amer 23 (L) >60 mL/min   GFR calc Af Amer 27 (L) >60 mL/min    Comment: (NOTE) The eGFR has been calculated using the CKD EPI equation. This calculation has not been validated in all clinical situations. eGFR's persistently <60 mL/min signify possible Chronic Kidney Disease.    Anion gap 12 5 - 15  CBC with Differential/Platelet     Status: Abnormal  Collection Time: 02/07/17 12:53 AM  Result Value Ref Range   WBC 15.3 (H) 4.0 - 10.5 K/uL   RBC 3.06 (L) 4.22 - 5.81 MIL/uL   Hemoglobin 10.1 (L) 13.0 - 17.0 g/dL   HCT 31.7 (L) 39.0 - 52.0 %   MCV 103.6 (H) 78.0 - 100.0 fL   MCH 33.0 26.0 - 34.0 pg   MCHC 31.9 30.0 - 36.0 g/dL   RDW 16.9 (H) 11.5 - 15.5 %   Platelets 260 150 - 400 K/uL    Neutrophils Relative % 93 %   Neutro Abs 14.2 (H) 1.7 - 7.7 K/uL   Lymphocytes Relative 4 %   Lymphs Abs 0.7 0.7 - 4.0 K/uL   Monocytes Relative 3 %   Monocytes Absolute 0.5 0.1 - 1.0 K/uL   Eosinophils Relative 0 %   Eosinophils Absolute 0.0 0.0 - 0.7 K/uL   Basophils Relative 0 %   Basophils Absolute 0.0 0.0 - 0.1 K/uL  Brain natriuretic peptide     Status: Abnormal   Collection Time: 02/07/17 12:53 AM  Result Value Ref Range   B Natriuretic Peptide 1,599.6 (H) 0.0 - 100.0 pg/mL  Hepatic function panel     Status: Abnormal   Collection Time: 02/07/17 12:53 AM  Result Value Ref Range   Total Protein 6.7 6.5 - 8.1 g/dL   Albumin 2.5 (L) 3.5 - 5.0 g/dL   AST 29 15 - 41 U/L   ALT 14 (L) 17 - 63 U/L   Alkaline Phosphatase 95 38 - 126 U/L   Total Bilirubin 2.4 (H) 0.3 - 1.2 mg/dL   Bilirubin, Direct 1.1 (H) 0.1 - 0.5 mg/dL   Indirect Bilirubin 1.3 (H) 0.3 - 0.9 mg/dL  Glucose, capillary     Status: Abnormal   Collection Time: 02/07/17  7:43 AM  Result Value Ref Range   Glucose-Capillary 132 (H) 65 - 99 mg/dL    MICRO: 4/28 blood cx ngtd 4/26 1 of 4 bottles MRSA IMAGING: Ct Abdomen Pelvis Wo Contrast  Result Date: 02/07/2017 CLINICAL DATA:  Evaluate for ileus. Nausea and vomiting. History of alcoholism, sepsis. EXAM: CT ABDOMEN AND PELVIS WITHOUT CONTRAST TECHNIQUE: Multidetector CT imaging of the abdomen and pelvis was performed following the standard protocol without IV contrast. COMPARISON:  Abdominal radiograph February 06, 2017 and CT pelvis November 25, 2016 FINDINGS: LOWER CHEST: Small RIGHT and moderate LEFT pleural effusions. RIGHT middle and RIGHT lower lobe consolidation with air bronchograms in centrilobular ground-glass nodules. LEFT lower lobe versus pneumonia. The heart is moderately enlarged. Moderate coronary artery calcifications. No pericardial effusion. HEPATOBILIARY: Slightly nodular liver compatible with cirrhosis. Small gallstones without CT findings of acute  cholecystitis. PANCREAS: Atrophic pancreas. SPLEEN: Normal. ADRENALS/URINARY TRACT: Kidneys are orthotopic, demonstrating normal size and morphology. No nephrolithiasis, hydronephrosis; limited assessment for renal masses on this nonenhanced examination. The unopacified ureters are normal in course and caliber. Urinary bladder is partially distended and unremarkable. Thickened adrenal glands most compatible with hyperplasia. STOMACH/BOWEL: Contrast distended stomach. Multiple loops of mildly prominent small and large bowel with air-fluid levels. VASCULAR/LYMPHATIC: Enlarged inferior vena cava compatible with RIGHT heart failure. Aortoiliac vessels are normal in course and caliber, mild calcific atherosclerosis REPRODUCTIVE: Normal. OTHER: Presacral fat stranding most compatible with edema, generalized mesenteric edema and small volume ascites. MUSCULOSKELETAL: Severe anasarca. Subacute LEFT posterior fifth through eleventh rib fractures. Gynecomastia. Severe RIGHT and moderate to severe LEFT hip osteoarthrosis. Old LEFT inferior pubic ramus fracture. Scoliosis. Multilevel severe degenerative change of the lumbar spine. IMPRESSION: Ileus,  no bowel obstruction. Findings of severe RIGHT heart failure and mild cirrhosis resulting in severe anasarca, small volume ascites and mesenteric edema. Small RIGHT pleural effusion with RIGHT lung base pneumonia. Small to moderate LEFT pleural effusion with atelectasis versus pneumonia. Electronically Signed   By: Elon Alas M.D.   On: 02/07/2017 01:37   US Renal  Result Date: 02/06/2017 CLINICAL DATA:  Acute renal injury. EXAM: RENAL / URINARY TRACT ULTRASOUND COMPLETE COMPARISON:  None. FINDINGS: Right Kidney: Length: 10.6 cm. Echogenicity within normal limits. No mass or hydronephrosis visualized. Left Kidney: Length: 11.4 cm. Visualization is somewhat limited but no abnormalities are seen. Bladder: Not visualized. IMPRESSION: 1. The left kidney was poorly visualized  but grossly unremarkable. The right kidney is normal in appearance. The bladder was not visualized. Ascites near the right lobe of the liver is incidentally noted. Electronically Signed   By: Dorise Bullion III M.D   On: 02/06/2017 15:54   Dg Abd Acute W/chest  Result Date: 02/06/2017 CLINICAL DATA:  Low oxygen saturation. Vomiting. X 1 day. Hx afib, cardiomyopathy, CHF, GERD, gout, htn, obesity, ventricular tachycardia EXAM: DG ABDOMEN ACUTE W/ 1V CHEST COMPARISON:  11/25/2016 FINDINGS: There are gas-filled loops of small and large bowel without significant dilation. There are few air-fluid levels on the decubitus view. Findings suggest an adynamic ileus. There is no free air. The soft tissues are not well visualized due to the overlying bowel gas. Frontal chest radiograph demonstrates mild enlargement of cardiac silhouette. There is perihilar and medial lung base opacity, greater on the left. The right medial lung base opacity has increased from the prior study. Possible left pleural effusion. No evidence a right pleural effusion. No pneumothorax. Skeletal structures are diffusely demineralized. There are degenerative changes throughout the visualized spine with several vertebral compression fractures presumed old. IMPRESSION: 1. Gas-filled bowel without significant dilation, but with a few air-fluid levels on the decubitus view. This suggests an adynamic ileus. No convincing obstruction. No free air. 2. Perihilar and left greater than right lung base opacity. Findings may reflect mild pulmonary edema with lung base atelectasis. Pneumonia is possible. Electronically Signed   By: Lajean Manes M.D.   On: 02/06/2017 22:06   Assessment/Plan:  71 yo M with afib, chronic diastolic HF, malnutrition, gout admitted for gouty flare plus shingles found to have transient MRSA bacteremia. Source of bacteremia could be due to his left foot toe ulcer  - continue to treat with daptomycin 39m/kg daily for minimum of 2  wk- possibly 4 wk - recommend to get TTE to evaluate for endocarditis  Worsening AKI =likely multifactorial. vanco/piptazo/acyclovir IV/colchicine combination BL appears to be 1.2 in January 2018. Will renally adjust the daptomycin to QOD. Markedly elevated BNP may be due to diastolic HF. Will recommend to minimize any nephrotoxic drugs. Have stopped vanco. Will stop IV acyclovir and can switch to lower dose valtrex possibly less nephrotox. Recommend to get renal consult  Acute on chronic diastolic heart failure = dr aRayann Hemanand cardiology involved. Recommending IV diuresis for now but felt have poor prognosis with long term management  Foot ulcer = possibly source of bacteremia. Once renal failure improved consider imaging ct vs mri.  Shingles = currently on day 4 of IV acyclovir. Will d/c and change to oral valtrex, possibly less nephrotox. Continue on airborne isolation for now since vesicles are not crusted over  Health maintenance = would check hiv and hep c  Pleural effusion = likely due to anasarca rather than infection  Dr Linus Salmons to provide further recs tomorrow

## 2017-02-08 ENCOUNTER — Inpatient Hospital Stay (HOSPITAL_COMMUNITY): Payer: Medicare HMO

## 2017-02-08 DIAGNOSIS — Z66 Do not resuscitate: Secondary | ICD-10-CM

## 2017-02-08 DIAGNOSIS — I509 Heart failure, unspecified: Secondary | ICD-10-CM

## 2017-02-08 DIAGNOSIS — Z515 Encounter for palliative care: Secondary | ICD-10-CM

## 2017-02-08 DIAGNOSIS — N179 Acute kidney failure, unspecified: Secondary | ICD-10-CM

## 2017-02-08 DIAGNOSIS — R627 Adult failure to thrive: Secondary | ICD-10-CM

## 2017-02-08 DIAGNOSIS — R7881 Bacteremia: Secondary | ICD-10-CM

## 2017-02-08 LAB — BASIC METABOLIC PANEL
Anion gap: 11 (ref 5–15)
BUN: 102 mg/dL — AB (ref 6–20)
CALCIUM: 8 mg/dL — AB (ref 8.9–10.3)
CHLORIDE: 106 mmol/L (ref 101–111)
CO2: 24 mmol/L (ref 22–32)
CREATININE: 3.31 mg/dL — AB (ref 0.61–1.24)
GFR calc Af Amer: 20 mL/min — ABNORMAL LOW (ref >60)
GFR calc non Af Amer: 18 mL/min — ABNORMAL LOW (ref >60)
Glucose, Bld: 116 mg/dL — ABNORMAL HIGH (ref 65–99)
Potassium: 4.3 mmol/L (ref 3.5–5.1)
SODIUM: 141 mmol/L (ref 135–145)

## 2017-02-08 LAB — ECHOCARDIOGRAM COMPLETE
Height: 72 in
WEIGHTICAEL: 4624.37 [oz_av]

## 2017-02-08 LAB — PROTIME-INR
INR: 4.13
INR: 3.52
Prothrombin Time: 41.1 seconds — ABNORMAL HIGH (ref 11.4–15.2)
Prothrombin Time: 36.1 seconds — ABNORMAL HIGH (ref 11.4–15.2)

## 2017-02-08 LAB — CBC WITH DIFFERENTIAL/PLATELET
Basophils Absolute: 0 10*3/uL (ref 0.0–0.1)
Basophils Relative: 0 %
EOS ABS: 0.1 10*3/uL (ref 0.0–0.7)
EOS PCT: 0 %
HCT: 27.7 % — ABNORMAL LOW (ref 39.0–52.0)
Hemoglobin: 8.8 g/dL — ABNORMAL LOW (ref 13.0–17.0)
LYMPHS ABS: 0.5 10*3/uL — AB (ref 0.7–4.0)
Lymphocytes Relative: 3 %
MCH: 33.2 pg (ref 26.0–34.0)
MCHC: 31.8 g/dL (ref 30.0–36.0)
MCV: 104.5 fL — ABNORMAL HIGH (ref 78.0–100.0)
MONO ABS: 0.7 10*3/uL (ref 0.1–1.0)
MONOS PCT: 4 %
Neutro Abs: 17.7 10*3/uL — ABNORMAL HIGH (ref 1.7–7.7)
Neutrophils Relative %: 93 %
PLATELETS: 240 10*3/uL (ref 150–400)
RBC: 2.65 MIL/uL — ABNORMAL LOW (ref 4.22–5.81)
RDW: 16.8 % — ABNORMAL HIGH (ref 11.5–15.5)
WBC: 19 10*3/uL — ABNORMAL HIGH (ref 4.0–10.5)

## 2017-02-08 LAB — CULTURE, BLOOD (ROUTINE X 2): SPECIAL REQUESTS: ADEQUATE

## 2017-02-08 LAB — GLUCOSE, CAPILLARY: GLUCOSE-CAPILLARY: 111 mg/dL — AB (ref 65–99)

## 2017-02-08 MED ORDER — SODIUM CHLORIDE 0.9 % IV SOLN: 800.0000 mg | INTRAVENOUS | Status: DC

## 2017-02-08 MED ORDER — VALACYCLOVIR HCL 500 MG PO TABS
1000.0000 mg | ORAL_TABLET | Freq: Every day | ORAL | Status: DC
  Filled 2017-02-08: qty 2

## 2017-02-08 NOTE — Consult Note (Signed)
Renal Service Consult Note [facility]  [name] 02/08/2017 Maree Krabbe Requesting Physician:  Dr Alvino Chapel  Reason for Consult:  Acute renal failure HPI: The patient is a 71 y.o. year-old came to ED on 4/21 with pain and swelling in the R hand and wrist, could not get himself up.  No fevers. Did have shoulder rash but no pain.  Cr was 1.7, INR 4.5 on coumadin.  Pt was admitted and treated with clindamycin for cellulitis and colchicine and allopurinol for suspected gout.  Got acyclovir for suspected shingles rash. Lasix was decreased due to [place].  Since admission creat increased from 1.7 up to 2.2, then 2.6 yest and 3.3 today.  Has not rec'd any IV contrast, ACEi' ARB/ nsaids. Has rec'd several IV abx and po acyclovir. BP's ok except low the last 36 hours.  CT abd showed severe anasarca with R HF.    We are asked to see for acute / CRF.   Pt is confused but able to answer simple questions.  Somnolent.  No SOB or cough.      UA > 4/26 >> cloudy, many bact, amber, larg LE, 0-5 epi/ tntc rbc/ tntc wbc, 100 prot Blood cx's 4.26 +MRSA 1 of 2; blood cx's 4/28 neg x 2  Inpatient meds > zovirax po, zyloprim, colchicine, ensure, lasix po then IV, mVI, percocet, vit K, statin, thiamine, Valtrex last 2 days, warfarin ( not given); IV clinda (5 doses 4/26-4/27), IV acyclovir (1 dose on 4/29) , Daptomycin IV (2 doses 4/28-4/29), IV zosyn (3 doses 4.26 and 4/29) , IV Vanc (2 doses , one on 4/26 and other on 4/28); no contrast  CT abd no contrast >  Ileus, no bowel obstruction.  Findings of severe RIGHT heart failure and mild cirrhosis resulting in severe anasarca, small volume ascites and mesenteric edema. Small RIGHT pleural effusion with RIGHT lung base pneumonia. Small to moderate LEFT pleural effusion with atelectasis versus pneumonia.  Old chart/ labs: Aug 2011 -  Cr 1.6 > 0.88 Jul 2014 -  Cr 2.6 > 1.15 Jan 2015 - Cr 2.10 > 0.86 Jan 2017 - Cr 1.96 > 1.82 May 2017 - Cr 1.3 >  1.19 Mar 2016 > Cr 1.19 Oct 2016 > Cr 1.0- 1.13 Nov 2016 > 1.24 This admission: February 04, 2017  > 1.72 April 28          > 2.20 April 29           > 2.62 February 08, 2017 > 3.31  Old chart: Jun 14 - fever, celluiltiis R leg, yeast infection abd intertrigo, acute resp dailure d/t diast CHF exac, EF 55%; obesity, HTN, afib May 16 - bilat LE cellulitis, sepsis, etoh hepatitis, strep viridans bacteremia, HTN, acute resp failure/ NSTEMI, afib, MO, HTN Jan 17 - falls/ inability to ambulate > anasarca/ acute on chron diast CHF and /or cor pulmonale, HTN, PAF, AKI, falling, ^LFT's Jun 2017 - comm acq PNA, acute resp failure/ ^CO2, AMS, OSA/ OHS, acut/ chron diast  CHF; diuresed 20kg from 150 > 130kg. afib on coumadin.  Hx etoh. AMS, resolved. Acute gout.  Jan 18 - weak at home, fell out of bed, legs swelling; family unable to care for him at home > CHF exac rx with diuretics; NSVT; debility - sent to SNF, afib on coumadin   ROS  denies CP  no joint pain   no HA  no blurry vision  no rash  no diarrhea  no nausea/  vomiting  no dysuria  no difficulty voiding  no change in urine color    Past Medical History  Past Medical History:  Diagnosis Date  . Alcoholism (HCC)   . Anemia   . CHF (congestive heart failure) (HCC)    ejection fractio of 45% per echo in 2011 (previos EF 35-40% in 2003), echo 04/2013  -LVEF 50-55%.  . Chronic renal insufficiency   . Complication of anesthesia    April 11, 2013  . GERD (gastroesophageal reflux disease)    takes tums prn  . Gout   . Hyperlipidemia   . Hypertension   . Hypoxemia   . Obesity   . Permanent atrial fibrillation (HCC)   . Ventricular tachycardia (HCC)    nonsustained, asymptomatic   Past Surgical History  Past Surgical History:  Procedure Laterality Date  . I&D EXTREMITY Right 06/28/2013   Procedure: IRRIGATION AND DEBRIDEMENT OF RIGHT LEG WITH SURGICAL PREP/PLACEMENT OF ACELL ;  Surgeon: Wayland Denis, DO;  Location: [facility] OR;  Service:  Plastics;  Laterality: Right;  . KNEE BURSECTOMY Right 04/20/2013   Procedure: Right knee prepatella bursa decompression;  Surgeon: Cammy Copa, MD;  Location: [facility] OR;  Service: Orthopedics;  Laterality: Right;  . TONSILLECTOMY     Family History  Family History  Problem Relation Age of Onset  . COPD Mother   . Hypertension Father    Social History  reports that he has quit smoking. His smoking use included Cigarettes. He started smoking about 29 years ago. He has never used smokeless tobacco. He reports that he drinks about 6.0 oz of alcohol per week . He reports that he does not use drugs. Allergies No Known Allergies Home medications Prior to Admission medications   Medication Sig Start Date End Date Taking? Authorizing Provider  allopurinol (ZYLOPRIM) 100 MG tablet Take 100 mg by mouth 2 (two) times daily.  01/14/16  Yes Historical Provider, MD  folic acid (FOLVITE) 1 MG tablet Take 1 tablet (1 mg total) by mouth daily. 11/05/16  Yes Filbert Schilder, MD  furosemide (LASIX) 40 MG tablet Take 40 mg by mouth 2 (two) times daily. 01/13/17  Yes Historical Provider, MD  metoprolol succinate (TOPROL-XL) 25 MG 24 hr tablet Take 0.5 tablets (12.5 mg total) by mouth daily. 11/05/16  Yes Filbert Schilder, MD  Multiple Vitamin (MULTIVITAMIN WITH MINERALS) TABS tablet Take 1 tablet by mouth daily.   Yes Historical Provider, MD  potassium chloride SA (K-DUR,KLOR-CON) 20 MEQ tablet Take 1 tablet (20 mEq total) by mouth daily. Patient taking differently: Take 20 mEq by mouth 2 (two) times daily.  10/25/15  Yes Elease Etienne, MD  pravastatin (PRAVACHOL) 10 MG tablet Take 10 mg by mouth daily.   Yes Historical Provider, MD  thiamine 100 MG tablet Take 1 tablet (100 mg total) by mouth daily. 11/05/16  Yes Filbert Schilder, MD  warfarin (COUMADIN) 5 MG tablet Take 0.5 tablets (2.5 mg total) by mouth daily at 6 PM. Patient taking differently: Take 2.5-5 mg by mouth as directed. Take 1 tablet (5  mg) on Sun, Tues, Thurs, Sat and Take 0.5 tablet (2.5 mg) on MWF 10/26/15  Yes Elease Etienne, MD  acetaminophen (TYLENOL) 325 MG tablet Take 2 tablets (650 mg total) by mouth every 6 (six) hours as needed for mild pain, moderate pain, fever or headache (or Fever >/= 101). Patient not taking: Reported on 02/04/2017 10/25/15   Elease Etienne, MD  Amino Acids-Protein Hydrolys (FEEDING  SUPPLEMENT, PRO-STAT SUGAR FREE 64,) LIQD Take 30 mLs by mouth 2 (two) times daily. Patient not taking: Reported on 08/27/2016 10/25/15   Elease Etienne, MD  feeding supplement, ENSURE ENLIVE, (ENSURE ENLIVE) LIQD Take 237 mLs by mouth 2 (two) times daily between meals. 10/25/15   Elease Etienne, MD  furosemide (LASIX) 80 MG tablet Take 1 tablet (80 mg total) by mouth 2 (two) times daily. Patient not taking: Reported on 02/04/2017 11/04/16   Filbert Schilder, MD  guaiFENesin (MUCINEX) 600 MG 12 hr tablet Take 1 tablet (600 mg total) by mouth 2 (two) times daily. Patient not taking: Reported on 11/25/2016 11/04/16   Filbert Schilder, MD  ipratropium-albuterol (DUONEB) 0.5-2.5 (3) MG/3ML SOLN Take 3 mLs by nebulization every 2 (two) hours as needed. Patient not taking: Reported on 11/25/2016 11/04/16   Filbert Schilder, MD   Liver Function Tests  Recent Labs Lab 02/07/17 0053  AST 29  ALT 14*  ALKPHOS 95  BILITOT 2.4*  PROT 6.7  ALBUMIN 2.5*   No results for input(s): LIPASE, AMYLASE in the last 168 hours. CBC  Recent Labs Lab 02/06/17 0535 02/07/17 0053 02/08/17 0553  WBC 12.6* 15.3* 19.0*  NEUTROABS 11.2* 14.2* 17.7*  HGB 10.7* 10.1* 8.8*  HCT 32.0* 31.7* 27.7*  MCV 100.9* 103.6* 104.5*  PLT 242 260 240   Basic Metabolic Panel  Recent Labs Lab 02/04/17 0832 02/05/17 0550 02/06/17 0535 02/07/17 0053 02/08/17 0553  NA 141 142 141 141 141  K 3.7 3.6 3.8 4.1 4.3  CL 106 106 106 105 106  CO2 23 25 24 24 24   GLUCOSE 70 91 103* 118* 116*  BUN 55* 63* 66* 77* 102*  CREATININE 1.72*  1.94* 2.20* 2.62* 3.31*  CALCIUM 8.4* 8.4* 8.3* 8.3* 8.0*   Iron/TIBC/Ferritin/ %Sat    Component Value Date/Time   IRON 22 (L) 02/07/2015 0508   TIBC 192 (L) 02/07/2015 0508   FERRITIN 564 (H) 02/07/2015 0508   IRONPCTSAT 11 (L) 02/07/2015 0508    Vitals:   02/07/17 0600 02/07/17 1500 02/07/17 2116 02/08/17 0452  BP:  (!) 92/47 (!) 102/57 (!) 122/59  Pulse:  85 94 99  Resp:  18 18 20   Temp:  98.1 F (36.7 C) 98.4 F (36.9 C) 98 F (36.7 C)  TempSrc:  Oral Oral Oral  SpO2:  96% 96% 96%  Weight: 129.7 kg (285 lb 15 oz)   131.1 kg (289 lb 0.4 oz)  Height:       Exam Gen morbidly obese, muscle wasting in the temples, jerking when awake of UE's No rash, cyanosis or gangrene Sclera anicteric, throat clear No jvd or bruits Chest clear bilat to bases RRR no MRG Abd soft ntnd no mass or ascites +bs GU normal male MS no joint effusions or deformity Ext marked obesity bilat LE's, 2-3+ bilat LE pitting edema and hip edema Neuro is somnolent, awakens to voice, slurred speech, myoclonic slow jerking or UE's and LE's off and on  BUN 102  Cr 3.31    Assessment: 1. Acute on CKD3 - multiple abx given but none in really toxic doses.  Could have a cardiorenal syndrome with severe RHF history.  He is a poor HD candidate with severe comorbidities and severe debility.  He is probably already uremic, or may have some hepatic enceph as well.  Would recommend palliative care approach.  Have d/w patient EOL issues, although he is somnolent and confused and i'm  not sure how  much he understands the situation.  Have d/w primary team.  Suspect he will get worse, no other suggestions.  2. Morbid obesity 3. AMS - myoclonic jerking 4. Anasarca 5. Right heart failure 6. Hx etoh abuse    Plan - as above  Vinson Moselle MD [employer] pager [phone]   02/08/2017, 4:06 PM

## 2017-02-08 NOTE — Consult Note (Signed)
Consultation Note Date: 02/08/2017   Patient Name: [name]  DOB: 04-27-1946  MRN: [id number]  Age / Sex: 72 y.o., male  PCP: [facility] Referring Physician: Jordan Hawks*  Reason for Consultation: Establishing goals of care and Psychosocial/spiritual support  HPI/Patient Profile: 71 y.o. male  admitted on 02/04/2017 with past medical history significant for gout, chronic diastolic CHF, last 2-D echo in January 2018 showed ejection fraction 55% and grade 3 diastolic dysfunction, dyslipidemia, atrial fibrillation on anticoagulation with Coumadin.   Patient presented to [facility] long with worsening pain and redness and swelling in the right hand and his wrist. He reports taking allopurinol as prescribed and has not had gout flareup in a while but over past 3 days he was trying to get up from the bed and he mostly uses a right hand and this time the pain was getting worse and he could not get himself up. Patient reports no fevers. No reports of chills. He does have a rash over his right shoulder and chest but reports no pain over that area.  Patient was admitted for management of gout flare, possible shingles and foot infection.  He has had continued physical and functional decline.  He and his family face advanced directive decisions and anticipatory care needs.   Clinical Assessment and Goals of Care:  This NP Lorinda Creed reviewed medical records, received report from team, assessed the patient and then meet at the patient's bedside  to discuss diagnosis, prognosis, GOC, EOL wishes disposition and options.    I then spoke by telephone with his brother and main support person/ [name]  A  discussion was had today regarding advanced directives.  Concepts specific to code statuswas had.   Values and goals of care important to patient and family were attempted to  be elicited.  Concept of Palliative Care was discussed    Questions and concerns addressed.   Family encouraged to call with questions or concerns.  PMT will continue to support holistically.     SUMMARY OF RECOMMENDATIONS    Code Status/Advance Care Planning:   DNR-documented today as stated by patient and supported by his brother   Palliative Prophylaxis:   Aspiration, Bowel Regimen, Delirium Protocol, Frequent Pain Assessment and Oral Care  Additional Recommendations (Limitations, Scope, Preferences):  Full Scope Treatment  Psycho-social/Spiritual:   Desire for further Chaplaincy support:yes  Prognosis:   Unable to determine  Discharge Planning: To Be Determined      Primary Diagnoses: Present on Admission: . Gout flare   I have reviewed the medical record, interviewed the patient and family, and examined the patient. The following aspects are pertinent.  Past Medical History:  Diagnosis Date  . Alcoholism (HCC)   . Anemia   . CHF (congestive heart failure) (HCC)    ejection fractio of 45% per echo in 2011 (previos EF 35-40% in 2003), echo 04/2013  -LVEF 50-55%.  . Chronic renal insufficiency   . Complication of anesthesia    April 11, 2013  .  GERD (gastroesophageal reflux disease)    takes tums prn  . Gout   . Hyperlipidemia   . Hypertension   . Hypoxemia   . Obesity   . Permanent atrial fibrillation (HCC)   . Ventricular tachycardia (HCC)    nonsustained, asymptomatic   Social History   Social History  . Marital status: Single    Spouse name: N/A  . Number of children: N/A  . Years of education: N/A   Social History Main Topics  . Smoking status: Former Smoker    Types: Cigarettes    Start date: 06/29/1987  . Smokeless tobacco: Never Used     Comment: stopped in 1985  . Alcohol use 6.0 oz/week    10 Cans of beer per week     Comment: 03/04/16 - "I have not been drunk in 20 years. I can take it or leave it. "  . Drug use: No  . Sexual  activity: No   Other Topics Concern  . None   Social History Narrative   Has never been married, lives with his father. Is currently disabled. Quit smoking in 1985 after two packs per day for 10 years. Still drinks beer.    Family History  Problem Relation Age of Onset  . COPD Mother   . Hypertension Father    Scheduled Meds: . furosemide  40 mg Intravenous BID  . sodium chloride flush  3 mL Intravenous Q12H  . valACYclovir  500 mg Oral Daily  . Warfarin - Pharmacist Dosing Inpatient   Does not apply q1800   Continuous Infusions: . DAPTOmycin (CUBICIN)  IV Stopped (02/07/17 2227)   PRN Meds:.acetaminophen **OR** acetaminophen, morphine injection, ondansetron **OR** ondansetron (ZOFRAN) IV, promethazine Medications Prior to Admission:  Prior to Admission medications   Medication Sig Start Date End Date Taking? Authorizing Provider  allopurinol (ZYLOPRIM) 100 MG tablet Take 100 mg by mouth 2 (two) times daily.  01/14/16  Yes Historical Provider, MD  folic acid (FOLVITE) 1 MG tablet Take 1 tablet (1 mg total) by mouth daily. 11/05/16  Yes Filbert Schilder, MD  furosemide (LASIX) 40 MG tablet Take 40 mg by mouth 2 (two) times daily. 01/13/17  Yes Historical Provider, MD  metoprolol succinate (TOPROL-XL) 25 MG 24 hr tablet Take 0.5 tablets (12.5 mg total) by mouth daily. 11/05/16  Yes Filbert Schilder, MD  Multiple Vitamin (MULTIVITAMIN WITH MINERALS) TABS tablet Take 1 tablet by mouth daily.   Yes Historical Provider, MD  potassium chloride SA (K-DUR,KLOR-CON) 20 MEQ tablet Take 1 tablet (20 mEq total) by mouth daily. Patient taking differently: Take 20 mEq by mouth 2 (two) times daily.  10/25/15  Yes Elease Etienne, MD  pravastatin (PRAVACHOL) 10 MG tablet Take 10 mg by mouth daily.   Yes Historical Provider, MD  thiamine 100 MG tablet Take 1 tablet (100 mg total) by mouth daily. 11/05/16  Yes Filbert Schilder, MD  warfarin (COUMADIN) 5 MG tablet Take 0.5 tablets (2.5 mg total)  by mouth daily at 6 PM. Patient taking differently: Take 2.5-5 mg by mouth as directed. Take 1 tablet (5 mg) on Sun, Tues, Thurs, Sat and Take 0.5 tablet (2.5 mg) on MWF 10/26/15  Yes Elease Etienne, MD  acetaminophen (TYLENOL) 325 MG tablet Take 2 tablets (650 mg total) by mouth every 6 (six) hours as needed for mild pain, moderate pain, fever or headache (or Fever >/= 101). Patient not taking: Reported on 02/04/2017 10/25/15   Elease Etienne, MD  Amino Acids-Protein Hydrolys (FEEDING SUPPLEMENT, PRO-STAT SUGAR FREE 64,) LIQD Take 30 mLs by mouth 2 (two) times daily. Patient not taking: Reported on 08/27/2016 10/25/15   Elease Etienne, MD  feeding supplement, ENSURE ENLIVE, (ENSURE ENLIVE) LIQD Take 237 mLs by mouth 2 (two) times daily between meals. 10/25/15   Elease Etienne, MD  furosemide (LASIX) 80 MG tablet Take 1 tablet (80 mg total) by mouth 2 (two) times daily. Patient not taking: Reported on 02/04/2017 11/04/16   Filbert Schilder, MD  guaiFENesin (MUCINEX) 600 MG 12 hr tablet Take 1 tablet (600 mg total) by mouth 2 (two) times daily. Patient not taking: Reported on 11/25/2016 11/04/16   Filbert Schilder, MD  ipratropium-albuterol (DUONEB) 0.5-2.5 (3) MG/3ML SOLN Take 3 mLs by nebulization every 2 (two) hours as needed. Patient not taking: Reported on 11/25/2016 11/04/16   Filbert Schilder, MD   No Known Allergies Review of Systems  Constitutional:       - main complaint today is thirst  Neurological: Positive for weakness.    Physical Exam  Constitutional: He appears cachectic. He appears ill.  Cardiovascular: Tachycardia present.   Pulmonary/Chest: He has decreased breath sounds in the right lower field and the left lower field.  Abdominal: He exhibits distension.  Neurological: He is alert.  Skin: Skin is warm and dry.  BLE, skin changes 2/2 circulation     Vital Signs: BP (!) 122/59 (BP Location: Left Arm)   Pulse 99   Temp 98 F (36.7 C) (Oral)   Resp 20    Ht 6' (1.829 m)   Wt 131.1 kg (289 lb 0.4 oz)   SpO2 96%   BMI 39.20 kg/m  Pain Assessment: 0-10   Pain Score: Asleep   SpO2: SpO2: 96 % O2 Device:SpO2: 96 % O2 Flow Rate: .O2 Flow Rate (L/min): 3 L/min  IO: Intake/output summary:  Intake/Output Summary (Last 24 hours) at 02/08/17 1610 Last data filed at 02/07/17 2200  Gross per 24 hour  Intake              176 ml  Output               50 ml  Net              126 ml    LBM: Last BM Date: 02/04/17 Baseline Weight: Weight: 128.6 kg (283 lb 8.2 oz) Most recent weight: Weight: 131.1 kg (289 lb 0.4 oz)      Palliative Assessment/Data: 30 % at best   Discussed with Dr Alvino Chapel  Meeting with brother tomorrow at 1230 for further discussion regarding GOCs  Time In: 1000 Time Out: 1115 Time Total: 75 min Greater than 50%  of this time was spent counseling and coordinating care related to the above assessment and plan.  Signed by: Lorinda Creed, NP   Please contact Palliative Medicine Team phone at [phone] for questions and concerns.  For individual provider: See Loretha Stapler

## 2017-02-08 NOTE — Care Management Important Message (Addendum)
Important Message  Patient Details IM Letter given to Cookie/Case Manager to present to Patient Name: [name] MRN: [id number] Date of Birth: [birth year]   Medicare Important Message Given:  Yes    Caren Macadam 02/08/2017, 12:07 PMImportant Message  Patient Details  Name: [name] MRN: [id number] Date of Birth: 22-Jan-1946   Medicare Important Message Given:  Yes    Caren Macadam 02/08/2017, 12:07 PM

## 2017-02-08 NOTE — Progress Notes (Signed)
CSW following for disposition/dc planning. Referred to SNFs and bed offers made- pt selects [facility]. Has been there for rehab several times in the past. States Brother Gene Reyner will sign admissions paperwork if needed by facility.   CSW selected [facility] in [place] and spoke with Janie in admissions. Wille Celeste will follow for potential dc date and begin [facility] auth request.  CSW will continue following.  Ilean Skill, MSW, LCSW Clinical Social Work 02/08/2017 [phone]

## 2017-02-08 NOTE — Progress Notes (Signed)
Updated order to reflect Droplet and Contact per Infection prevention protocol SRP RN

## 2017-02-08 NOTE — Progress Notes (Addendum)
PROGRESS NOTE    [name]  WJX:[id number] DOB: 12-30-1945 DOA: 02/04/2017 PCP: [facility]     Brief Narrative:  [name] is a 71 y.o. male with medical history significant for gout, chronic diastolic CHF, last 2-D echo in January 2018 showed ejection fraction 55% and grade 3 diastolic dysfunction, dyslipidemia, atrial fibrillation on anticoagulation with Coumadin, alcohol abuse. Patient presented to [facility] long with worsening pain and redness and swelling in the right hand and his wrist. He reports taking allopurinol as prescribed and has not had gout flareup in a while but over past 3 days he was trying to get up from the bed and he mostly uses a right hand and this time the pain was getting worse and he could not get himself up. Patient reports no fevers. No reports of chills. He does have a rash over his right shoulder and chest but reports no pain over that area. He is not sure for how long he has had about a rash that has been there for at least a week or so. He was admitted for further treatment of his right hand gout, right chest shingles, and left foot ulcer.  Assessment & Plan:   Active Problems:   Gout flare   Pressure injury of skin   Malnutrition of moderate degree   Gout flare, right wrist and hand with likely overlying cellulitis - Resolved. Will hold allopurinol and colchicine now in setting of worsening kidney function    MRSA bacteremia - Repeat blood cultures 4/28 negative thus far  - Daptomycin - Echo to evaluate endocarditis  - Possible source is left foot ulcer  - ID following   Shingles over right chest and right shoulder  - Airborne precaution - Changed to valtrex in setting of worsening kidney failure   Acute on chronic diastolic CHF - 2-D echo in January 2018 showed ejection fraction of 55% with grade 3 diastolic dysfunction - Worsening edema, anasarca, SOB, now requiring [place] O2, BNP 1599  - Lasix 40mg  IV BID  (takes 40mg  PO BID)  - Strict I/Os, daily weight  - Echo pending  - Cardiology consulted, no further cardiac recommendations, recommending palliative care   Acute hypoxemic respiratory failure - Combination of CHF and possibly aspiration - [place] O2 - Zosyn added to cover aspiration, SLP when ileus resolves and able to tolerate oral   Paralytic ileus - Vomiting, AXR and CT abdomen obtained which showed ileus without obstruction - NPO - Antiemetics  AKI on CKD stage 3 - Baseline creatinine 2017 was 1.3 - Renal US without hydronephrosis - Worsening in setting of various medications, heart failure, cirrhosis. Nephrology consulted  Cirrhosis, likely alcoholic  - CT abd/pelvis revealed mild cirrhosis, ascites  - Contributing to his overall volume status and poor prognosis   Supratherapeutic INR - Vit K 10mg  on 4/28  - Continue to hold coumadin - Daily INR  Left foot ulcer - Appreciate wound consultation - May be source of MRSA bacteremia per ID. Consider CT v MRI once kidney function more stable   Asymptomatic pyuria - No dysuria per report   Dyslipidemia - Holding pravachol in setting of daptomycin use   Atrial fibrillation - CHADS vasc score at least 2 - On anticoagulation with Coumadin; INR supratherapeutic so Coumadin will be held  - Currently regular rhythm   Chronic venous stasis bilaterally in lower extremities - Appreciate wound care recommendations  Alcohol abuse - Brother reports 48oz alcohol daily - CIWA  Goals of care - Spoke with patient this morning. He seems to understand his multitude of medical conditions and that despite our medical therapies, he continues to worsen. We discussed his CODE STATUS this morning. He states that he would not want to be resuscitated if his heart were to stop or if he were to stop breathing. He does not want a breathing tube, chest compressions, defibrillation. He states that he does not want to be resuscitated. RN at bedside  witnessed conversation. Spoke with brother over the phone as well who agree. Change CODE STATUS to DO NOT RESUSCITATE today. - Palliative care consulted   DVT prophylaxis: holding coumadin due to supratherapeutic INR Code Status: DNR  Family Communication: No family at bedside, spoke w brother over the phone  Disposition Plan: very poor prognosis    Consultants:   ID  Cardiology   Nephrology  Palliative care   Procedures:   None  Antimicrobials:  Anti-infectives    Start     Dose/Rate Route Frequency Ordered Stop   02/09/17 2200  DAPTOmycin (CUBICIN) 800 mg in sodium chloride 0.9 % IVPB     800 mg 232 mL/hr over 30 Minutes Intravenous Every 48 hours 02/08/17 1040     02/07/17 2000  valACYclovir (VALTREX) tablet 500 mg     500 mg Oral Daily 02/07/17 1714     02/07/17 1000  acyclovir (ZOVIRAX) 985 mg in dextrose 5 % 150 mL IVPB  Status:  Discontinued     10 mg/kg  98.4 kg (Adjusted) 169.7 mL/hr over 60 Minutes Intravenous Every 12 hours 02/07/17 0813 02/07/17 1708   02/07/17 0015  piperacillin-tazobactam (ZOSYN) IVPB 3.375 g  Status:  Discontinued     3.375 g 12.5 mL/hr over 240 Minutes Intravenous Every 8 hours 02/07/17 0008 02/07/17 1655   02/06/17 2200  vancomycin (VANCOCIN) 1,250 mg in sodium chloride 0.9 % 250 mL IVPB  Status:  Discontinued     1,250 mg 166.7 mL/hr over 90 Minutes Intravenous Every 24 hours 02/06/17 0406 02/06/17 0413   02/06/17 2200  vancomycin (VANCOCIN) 1,500 mg in sodium chloride 0.9 % 500 mL IVPB  Status:  Discontinued     1,500 mg 250 mL/hr over 120 Minutes Intravenous Daily at bedtime 02/06/17 0413 02/06/17 1745   02/06/17 2200  DAPTOmycin (CUBICIN) 800 mg in sodium chloride 0.9 % IVPB  Status:  Discontinued     800 mg 232 mL/hr over 30 Minutes Intravenous Every 24 hours 02/06/17 1806 02/08/17 1040   02/06/17 0415  vancomycin (VANCOCIN) 1,500 mg in sodium chloride 0.9 % 500 mL IVPB     1,500 mg 250 mL/hr over 120 Minutes Intravenous  Once  02/06/17 0406 02/06/17 0703   02/05/17 0615  acyclovir (ZOVIRAX) tablet 800 mg  Status:  Discontinued     800 mg Oral 5 times daily 02/05/17 0613 02/07/17 0813   02/04/17 1400  clindamycin (CLEOCIN) IVPB 600 mg  Status:  Discontinued     600 mg 100 mL/hr over 30 Minutes Intravenous Every 8 hours 02/04/17 1027 02/06/17 0358   02/04/17 1045  acyclovir (ZOVIRAX) 200 MG capsule 800 mg  Status:  Discontinued     800 mg Oral 5 times daily 02/04/17 1036 02/05/17 0614   02/04/17 0745  vancomycin (VANCOCIN) IVPB 1000 mg/200 mL premix     1,000 mg 200 mL/hr over 60 Minutes Intravenous  Once 02/04/17 0736 02/04/17 1022   02/04/17 0745  piperacillin-tazobactam (ZOSYN) IVPB 3.375 g     3.375 g 100  mL/hr over 30 Minutes Intravenous  Once 02/04/17 2130 02/04/17 0915         Subjective: Patient has no complaints today. States that his mouth is dry and wants some ice chips, no abdominal pain, no nausea or vomiting since yesterday.   Objective: Vitals:   02/07/17 0600 02/07/17 1500 02/07/17 2116 02/08/17 0452  BP:  (!) 92/47 (!) 102/57 (!) 122/59  Pulse:  85 94 99  Resp:  18 18 20   Temp:  98.1 F (36.7 C) 98.4 F (36.9 C) 98 F (36.7 C)  TempSrc:  Oral Oral Oral  SpO2:  96% 96% 96%  Weight: 129.7 kg (285 lb 15 oz)   131.1 kg (289 lb 0.4 oz)  Height:        Intake/Output Summary (Last 24 hours) at 02/08/17 1051 Last data filed at 02/07/17 2200  Gross per 24 hour  Intake              176 ml  Output               50 ml  Net              126 ml   Filed Weights   02/06/17 0457 02/07/17 0600 02/08/17 0452  Weight: 131.5 kg (289 lb 14.5 oz) 129.7 kg (285 lb 15 oz) 131.1 kg (289 lb 0.4 oz)    Examination:  General exam: Appears calm and comfortable  Respiratory system: Clear to auscultation, diminished bases. Respiratory effort normal. On [place] O2  Cardiovascular system: S1 & S2 heard, RRR. No JVD, murmurs, rubs, gallops or clicks. +anasarca  Gastrointestinal system: Abdomen is  nondistended, soft and nontender to palpation  Central nervous system: Alert and oriented. No focal neurological deficits. Extremities: right wrist and hand gout has significantly improved in appearance, much less edema and no erythema  Skin: +crusting vesicular rash right chest, right shoulder, right upper back, +ulcer on plantar aspect left foot, +bilateral chronic venous stasis  Psychiatry: Judgement and insight appear normal. Mood & affect appropriate.   Data Reviewed: I have personally reviewed following labs and imaging studies  CBC:  Recent Labs Lab 02/04/17 0832 02/05/17 0550 02/06/17 0535 02/07/17 0053 02/08/17 0553  WBC 12.0* 11.2* 12.6* 15.3* 19.0*  NEUTROABS 10.6*  --  11.2* 14.2* 17.7*  HGB 10.0* 10.3* 10.7* 10.1* 8.8*  HCT 30.2* 30.9* 32.0* 31.7* 27.7*  MCV 102.0* 101.0* 100.9* 103.6* 104.5*  PLT 209 228 242 260 240   Basic Metabolic Panel:  Recent Labs Lab 02/04/17 0832 02/05/17 0550 02/06/17 0535 02/07/17 0053 02/08/17 0553  NA 141 142 141 141 141  K 3.7 3.6 3.8 4.1 4.3  CL 106 106 106 105 106  CO2 23 25 24 24 24   GLUCOSE 70 91 103* 118* 116*  BUN 55* 63* 66* 77* 102*  CREATININE 1.72* 1.94* 2.20* 2.62* 3.31*  CALCIUM 8.4* 8.4* 8.3* 8.3* 8.0*   GFR: Estimated Creatinine Clearance: 29.5 mL/min (A) (by C-G formula based on SCr of 3.31 mg/dL (H)). Liver Function Tests:  Recent Labs Lab 02/07/17 0053  AST 29  ALT 14*  ALKPHOS 95  BILITOT 2.4*  PROT 6.7  ALBUMIN 2.5*   No results for input(s): LIPASE, AMYLASE in the last 168 hours.  Recent Labs Lab 02/07/17 1416  AMMONIA 34   Coagulation Profile:  Recent Labs Lab 02/04/17 0832 02/05/17 0550 02/06/17 0535 02/07/17 0053 02/08/17 0553  INR 4.65* 6.71* >10.00* 6.57* 4.13*   Cardiac Enzymes:  Recent Labs Lab 02/06/17 1806  CKTOTAL 60   BNP (last 3 results) No results for input(s): PROBNP in the last 8760 hours. HbA1C: No results for input(s): HGBA1C in the last 72  hours. CBG:  Recent Labs Lab 02/05/17 0750 02/06/17 0806 02/07/17 0743 02/08/17 0740  GLUCAP 92 94 132* 111*   Lipid Profile: No results for input(s): CHOL, HDL, LDLCALC, TRIG, CHOLHDL, LDLDIRECT in the last 72 hours. Thyroid Function Tests: No results for input(s): TSH, T4TOTAL, FREET4, T3FREE, THYROIDAB in the last 72 hours. Anemia Panel: No results for input(s): VITAMINB12, FOLATE, FERRITIN, TIBC, IRON, RETICCTPCT in the last 72 hours. Sepsis Labs: No results for input(s): PROCALCITON, LATICACIDVEN in the last 168 hours.  Recent Results (from the past 240 hour(s))  Culture, blood (routine x 2)     Status: Abnormal   Collection Time: 02/04/17  8:30 AM  Result Value Ref Range Status   Specimen Description BLOOD LEFT FOREARM  Final   Special Requests   Final    BOTTLES DRAWN AEROBIC AND ANAEROBIC Blood Culture adequate volume   Culture  Setup Time   Final    ANAEROBIC BOTTLE ONLY GRAM POSITIVE COCCI IN CLUSTERS CRITICAL RESULT CALLED TO, READ BACK BY AND VERIFIED WITH: TO JGRIMSLEY(PHARMd) BY TCLEVELAND 02/05/2017 AT 3:53AM Performed at [facility], [address], [place], [place] [address]    Culture METHICILLIN RESISTANT STAPHYLOCOCCUS AUREUS (A)  Final   Report Status 02/08/2017 FINAL  Final   Organism ID, Bacteria METHICILLIN RESISTANT STAPHYLOCOCCUS AUREUS  Final      Susceptibility   Methicillin resistant staphylococcus aureus - MIC*    CIPROFLOXACIN >=8 RESISTANT Resistant     ERYTHROMYCIN 1 INTERMEDIATE Intermediate     GENTAMICIN <=0.5 SENSITIVE Sensitive     OXACILLIN >=4 RESISTANT Resistant     TETRACYCLINE <=1 SENSITIVE Sensitive     VANCOMYCIN <=0.5 SENSITIVE Sensitive     TRIMETH/SULFA <=10 SENSITIVE Sensitive     CLINDAMYCIN <=0.25 SENSITIVE Sensitive     RIFAMPIN <=0.5 SENSITIVE Sensitive     Inducible Clindamycin NEGATIVE Sensitive     * METHICILLIN RESISTANT STAPHYLOCOCCUS AUREUS  Blood Culture ID Panel (Reflexed)     Status: Abnormal    Collection Time: 02/04/17  8:30 AM  Result Value Ref Range Status   Enterococcus species NOT DETECTED NOT DETECTED Final   Listeria monocytogenes NOT DETECTED NOT DETECTED Final   Staphylococcus species DETECTED (A) NOT DETECTED Final    Comment: CRITICAL RESULT CALLED TO, READ BACK BY AND VERIFIED WITH: TO TO JGRIMSLEY(PHARMd0 BY TCLEVELAND 03/07/2017 AT 3:53AM    Staphylococcus aureus DETECTED (A) NOT DETECTED Final    Comment: Methicillin (oxacillin)-resistant Staphylococcus aureus (MRSA). MRSA is predictably resistant to beta-lactam antibiotics (except ceftaroline). Preferred therapy is vancomycin unless clinically contraindicated. Patient requires contact precautions if  hospitalized. CRITICAL RESULT CALLED TO, READ BACK BY AND VERIFIED WITH: TO TO JGRIMSLEY(PHARMd0 BY [facility] 03/07/2017 AT 3:53AM    Methicillin resistance DETECTED (A) NOT DETECTED Final    Comment: CRITICAL RESULT CALLED TO, READ BACK BY AND VERIFIED WITH: TO TO JGRIMSLEY(PHARMd0 BY TCLEVELAND 03/07/2017 AT 3:53AM    Streptococcus species NOT DETECTED NOT DETECTED Final   Streptococcus agalactiae NOT DETECTED NOT DETECTED Final   Streptococcus pneumoniae NOT DETECTED NOT DETECTED Final   Streptococcus pyogenes NOT DETECTED NOT DETECTED Final   Acinetobacter baumannii NOT DETECTED NOT DETECTED Final   Enterobacteriaceae species NOT DETECTED NOT DETECTED Final   Enterobacter cloacae complex NOT DETECTED NOT DETECTED Final   Escherichia coli NOT DETECTED NOT DETECTED  Final   Klebsiella oxytoca NOT DETECTED NOT DETECTED Final   Klebsiella pneumoniae NOT DETECTED NOT DETECTED Final   Proteus species NOT DETECTED NOT DETECTED Final   Serratia marcescens NOT DETECTED NOT DETECTED Final   Haemophilus influenzae NOT DETECTED NOT DETECTED Final   Neisseria meningitidis NOT DETECTED NOT DETECTED Final   Pseudomonas aeruginosa NOT DETECTED NOT DETECTED Final   Candida albicans NOT DETECTED NOT DETECTED Final   Candida  glabrata NOT DETECTED NOT DETECTED Final   Candida krusei NOT DETECTED NOT DETECTED Final   Candida parapsilosis NOT DETECTED NOT DETECTED Final   Candida tropicalis NOT DETECTED NOT DETECTED Final    Comment: Performed at [facility], [address], [place], [place] [address]  Culture, blood (routine x 2)     Status: None (Preliminary result)   Collection Time: 02/04/17  8:32 AM  Result Value Ref Range Status   Specimen Description BLOOD LEFT HAND  Final   Special Requests   Final    BOTTLES DRAWN AEROBIC AND ANAEROBIC Blood Culture adequate volume   Culture   Final    NO GROWTH 3 DAYS Performed at [facility], [address] [place], [place] [address]    Report Status PENDING  Incomplete  Culture, blood (routine x 2)     Status: None (Preliminary result)   Collection Time: 02/06/17 11:03 AM  Result Value Ref Range Status   Specimen Description BLOOD LEFT HAND  Final   Special Requests   Final    BOTTLES DRAWN AEROBIC AND ANAEROBIC Blood Culture adequate volume   Culture   Final    NO GROWTH < 24 HOURS Performed at [facility], [address] [place], [place] [address]    Report Status PENDING  Incomplete  Culture, blood (routine x 2)     Status: None (Preliminary result)   Collection Time: 02/06/17 11:04 AM  Result Value Ref Range Status   Specimen Description BLOOD RIGHT FOREARM  Final   Special Requests   Final    BOTTLES DRAWN AEROBIC AND ANAEROBIC Blood Culture adequate volume   Culture   Final    NO GROWTH < 24 HOURS Performed at [facility], [address], [place], [place] [address]    Report Status PENDING  Incomplete       Radiology Studies: Ct Abdomen Pelvis Wo Contrast  Result Date: 02/07/2017 CLINICAL DATA:  Evaluate for ileus. Nausea and vomiting. History of alcoholism, sepsis. EXAM: CT ABDOMEN AND PELVIS WITHOUT CONTRAST TECHNIQUE: Multidetector CT imaging of the abdomen and pelvis was performed following the standard  protocol without IV contrast. COMPARISON:  Abdominal radiograph February 06, 2017 and CT pelvis November 25, 2016 FINDINGS: LOWER CHEST: Small RIGHT and moderate LEFT pleural effusions. RIGHT middle and RIGHT lower lobe consolidation with air bronchograms in centrilobular ground-glass nodules. LEFT lower lobe versus pneumonia. The heart is moderately enlarged. Moderate coronary artery calcifications. No pericardial effusion. HEPATOBILIARY: Slightly nodular liver compatible with cirrhosis. Small gallstones without CT findings of acute cholecystitis. PANCREAS: Atrophic pancreas. SPLEEN: Normal. ADRENALS/URINARY TRACT: Kidneys are orthotopic, demonstrating normal size and morphology. No nephrolithiasis, hydronephrosis; limited assessment for renal masses on this nonenhanced examination. The unopacified ureters are normal in course and caliber. Urinary bladder is partially distended and unremarkable. Thickened adrenal glands most compatible with hyperplasia. STOMACH/BOWEL: Contrast distended stomach. Multiple loops of mildly prominent small and large bowel with air-fluid levels. VASCULAR/LYMPHATIC: Enlarged inferior vena cava compatible with RIGHT heart failure. Aortoiliac vessels  are normal in course and caliber, mild calcific atherosclerosis REPRODUCTIVE: Normal. OTHER: Presacral fat stranding most compatible with edema, generalized mesenteric edema and small volume ascites. MUSCULOSKELETAL: Severe anasarca. Subacute LEFT posterior fifth through eleventh rib fractures. Gynecomastia. Severe RIGHT and moderate to severe LEFT hip osteoarthrosis. Old LEFT inferior pubic ramus fracture. Scoliosis. Multilevel severe degenerative change of the lumbar spine. IMPRESSION: Ileus, no bowel obstruction. Findings of severe RIGHT heart failure and mild cirrhosis resulting in severe anasarca, small volume ascites and mesenteric edema. Small RIGHT pleural effusion with RIGHT lung base pneumonia. Small to moderate LEFT pleural effusion  with atelectasis versus pneumonia. Electronically Signed   By: Awilda Metro M.D.   On: 02/07/2017 01:37   US Renal  Result Date: 02/06/2017 CLINICAL DATA:  Acute renal injury. EXAM: RENAL / URINARY TRACT ULTRASOUND COMPLETE COMPARISON:  None. FINDINGS: Right Kidney: Length: 10.6 cm. Echogenicity within normal limits. No mass or hydronephrosis visualized. Left Kidney: Length: 11.4 cm. Visualization is somewhat limited but no abnormalities are seen. Bladder: Not visualized. IMPRESSION: 1. The left kidney was poorly visualized but grossly unremarkable. The right kidney is normal in appearance. The bladder was not visualized. Ascites near the right lobe of the liver is incidentally noted. Electronically Signed   By: Gerome Sam III M.D   On: 02/06/2017 15:54   Dg Abd Acute W/chest  Result Date: 02/06/2017 CLINICAL DATA:  Low oxygen saturation. Vomiting. X 1 day. Hx afib, cardiomyopathy, CHF, GERD, gout, htn, obesity, ventricular tachycardia EXAM: DG ABDOMEN ACUTE W/ 1V CHEST COMPARISON:  11/25/2016 FINDINGS: There are gas-filled loops of small and large bowel without significant dilation. There are few air-fluid levels on the decubitus view. Findings suggest an adynamic ileus. There is no free air. The soft tissues are not well visualized due to the overlying bowel gas. Frontal chest radiograph demonstrates mild enlargement of cardiac silhouette. There is perihilar and medial lung base opacity, greater on the left. The right medial lung base opacity has increased from the prior study. Possible left pleural effusion. No evidence a right pleural effusion. No pneumothorax. Skeletal structures are diffusely demineralized. There are degenerative changes throughout the visualized spine with several vertebral compression fractures presumed old. IMPRESSION: 1. Gas-filled bowel without significant dilation, but with a few air-fluid levels on the decubitus view. This suggests an adynamic ileus. No convincing  obstruction. No free air. 2. Perihilar and left greater than right lung base opacity. Findings may reflect mild pulmonary edema with lung base atelectasis. Pneumonia is possible. Electronically Signed   By: Amie Portland M.D.   On: 02/06/2017 22:06      Scheduled Meds: . furosemide  40 mg Intravenous BID  . sodium chloride flush  3 mL Intravenous Q12H  . valACYclovir  500 mg Oral Daily  . Warfarin - Pharmacist Dosing Inpatient   Does not apply q1800   Continuous Infusions: . [START ON 02/09/2017] DAPTOmycin (CUBICIN)  IV       LOS: 4 days    Time spent: 40 minutes   Noralee Stain, DO [facility] [email or web address] Password [email or web address] 02/08/2017, 10:51 AM

## 2017-02-08 NOTE — Progress Notes (Signed)
The patient has multiple acute on chronic conditions.  Though he does likely have some component of diastolic dysfunction, he has multiple issues at play.  He presents with anasarca and low albumen which are consistent with liver disease. (Cirrhosis is noted on CT done yesterday).  His prognosis is very poor.  Ultimately, a palliative approach may be best.  Continue with diuresis, sodium restriction, and strict I&Os. Scr worsening to 3.31 (2.62), K WNL; no ACEI/ARB. Continues in rate controlled Afib vs bouts of NSR with frequent PVCs.. INR improving, 6.57 from > 10.0, level pending today.  Please call with any questions.   Signed, Roe Rutherford Rasheka Denard , PA-C 7:46 AM 02/08/2017 Pager: [phone]

## 2017-02-08 NOTE — Progress Notes (Signed)
[facility]   Reason for visit: Follow up on MRSA bacteremia  Interval History: repeat blood cultures remain negative, WBC increased to 19, creat worsening; Day 5 antibiotics Day 3 daptomycin  Physical Exam: Constitutional:  Vitals:   02/07/17 2116 02/08/17 0452  BP: (!) 102/57 (!) 122/59  Pulse: 94 99  Resp: 18 20  Temp: 98.4 F (36.9 C) 98 F (36.7 C)   patient appears in NAD Respiratory: Normal respiratory effort; CTA B Cardiovascular: RRR GI: soft, nt, nd  Review of Systems: Constitutional: negative for fevers and chills Gastrointestinal: negative for diarrhea Integument/breast: negative for rash  Lab Results  Component Value Date   WBC 19.0 (H) 02/08/2017   HGB 8.8 (L) 02/08/2017   HCT 27.7 (L) 02/08/2017   MCV 104.5 (H) 02/08/2017   PLT 240 02/08/2017    Lab Results  Component Value Date   CREATININE 3.31 (H) 02/08/2017   BUN 102 (H) 02/08/2017   NA 141 02/08/2017   K 4.3 02/08/2017   CL 106 02/08/2017   CO2 24 02/08/2017    Lab Results  Component Value Date   ALT 14 (L) 02/07/2017   AST 29 02/07/2017   ALKPHOS 95 02/07/2017     Microbiology: Recent Results (from the past 240 hour(s))  Culture, blood (routine x 2)     Status: Abnormal   Collection Time: 02/04/17  8:30 AM  Result Value Ref Range Status   Specimen Description BLOOD LEFT FOREARM  Final   Special Requests   Final    BOTTLES DRAWN AEROBIC AND ANAEROBIC Blood Culture adequate volume   Culture  Setup Time   Final    ANAEROBIC BOTTLE ONLY GRAM POSITIVE COCCI IN CLUSTERS CRITICAL RESULT CALLED TO, READ BACK BY AND VERIFIED WITH: TO JGRIMSLEY(PHARMd) BY TCLEVELAND 02/05/2017 AT 3:53AM Performed at [facility], [address], [place], [place] [address]    Culture METHICILLIN RESISTANT STAPHYLOCOCCUS AUREUS (A)  Final   Report Status 02/08/2017 FINAL  Final   Organism ID, Bacteria METHICILLIN RESISTANT STAPHYLOCOCCUS AUREUS  Final      Susceptibility     Methicillin resistant staphylococcus aureus - MIC*    CIPROFLOXACIN >=8 RESISTANT Resistant     ERYTHROMYCIN 1 INTERMEDIATE Intermediate     GENTAMICIN <=0.5 SENSITIVE Sensitive     OXACILLIN >=4 RESISTANT Resistant     TETRACYCLINE <=1 SENSITIVE Sensitive     VANCOMYCIN <=0.5 SENSITIVE Sensitive     TRIMETH/SULFA <=10 SENSITIVE Sensitive     CLINDAMYCIN <=0.25 SENSITIVE Sensitive     RIFAMPIN <=0.5 SENSITIVE Sensitive     Inducible Clindamycin NEGATIVE Sensitive     * METHICILLIN RESISTANT STAPHYLOCOCCUS AUREUS  Blood Culture ID Panel (Reflexed)     Status: Abnormal   Collection Time: 02/04/17  8:30 AM  Result Value Ref Range Status   Enterococcus species NOT DETECTED NOT DETECTED Final   Listeria monocytogenes NOT DETECTED NOT DETECTED Final   Staphylococcus species DETECTED (A) NOT DETECTED Final    Comment: CRITICAL RESULT CALLED TO, READ BACK BY AND VERIFIED WITH: TO TO JGRIMSLEY(PHARMd0 BY TCLEVELAND 03/07/2017 AT 3:53AM    Staphylococcus aureus DETECTED (A) NOT DETECTED Final    Comment: Methicillin (oxacillin)-resistant Staphylococcus aureus (MRSA). MRSA is predictably resistant to beta-lactam antibiotics (except ceftaroline). Preferred therapy is vancomycin unless clinically contraindicated. Patient requires contact precautions if  hospitalized. CRITICAL RESULT CALLED TO, READ BACK BY AND VERIFIED WITH: TO TO JGRIMSLEY(PHARMd0 BY [facility] 03/07/2017 AT 3:53AM    Methicillin  resistance DETECTED (A) NOT DETECTED Final    Comment: CRITICAL RESULT CALLED TO, READ BACK BY AND VERIFIED WITH: TO TO JGRIMSLEY(PHARMd0 BY TCLEVELAND 03/07/2017 AT 3:53AM    Streptococcus species NOT DETECTED NOT DETECTED Final   Streptococcus agalactiae NOT DETECTED NOT DETECTED Final   Streptococcus pneumoniae NOT DETECTED NOT DETECTED Final   Streptococcus pyogenes NOT DETECTED NOT DETECTED Final   Acinetobacter baumannii NOT DETECTED NOT DETECTED Final   Enterobacteriaceae species NOT DETECTED  NOT DETECTED Final   Enterobacter cloacae complex NOT DETECTED NOT DETECTED Final   Escherichia coli NOT DETECTED NOT DETECTED Final   Klebsiella oxytoca NOT DETECTED NOT DETECTED Final   Klebsiella pneumoniae NOT DETECTED NOT DETECTED Final   Proteus species NOT DETECTED NOT DETECTED Final   Serratia marcescens NOT DETECTED NOT DETECTED Final   Haemophilus influenzae NOT DETECTED NOT DETECTED Final   Neisseria meningitidis NOT DETECTED NOT DETECTED Final   Pseudomonas aeruginosa NOT DETECTED NOT DETECTED Final   Candida albicans NOT DETECTED NOT DETECTED Final   Candida glabrata NOT DETECTED NOT DETECTED Final   Candida krusei NOT DETECTED NOT DETECTED Final   Candida parapsilosis NOT DETECTED NOT DETECTED Final   Candida tropicalis NOT DETECTED NOT DETECTED Final    Comment: Performed at [facility], [address], [place], [place] [address]  Culture, blood (routine x 2)     Status: None (Preliminary result)   Collection Time: 02/04/17  8:32 AM  Result Value Ref Range Status   Specimen Description BLOOD LEFT HAND  Final   Special Requests   Final    BOTTLES DRAWN AEROBIC AND ANAEROBIC Blood Culture adequate volume   Culture   Final    NO GROWTH 3 DAYS Performed at [facility], [address], [place], [place] [address]    Report Status PENDING  Incomplete  Culture, blood (routine x 2)     Status: None (Preliminary result)   Collection Time: 02/06/17 11:03 AM  Result Value Ref Range Status   Specimen Description BLOOD LEFT HAND  Final   Special Requests   Final    BOTTLES DRAWN AEROBIC AND ANAEROBIC Blood Culture adequate volume   Culture   Final    NO GROWTH < 24 HOURS Performed at [facility], [address], [place], [place] [address]    Report Status PENDING  Incomplete  Culture, blood (routine x 2)     Status: None (Preliminary result)   Collection Time: 02/06/17 11:04 AM  Result Value Ref Range Status   Specimen Description BLOOD RIGHT FOREARM   Final   Special Requests   Final    BOTTLES DRAWN AEROBIC AND ANAEROBIC Blood Culture adequate volume   Culture   Final    NO GROWTH < 24 HOURS Performed at [facility], [address], [place], [place] [address]    Report Status PENDING  Incomplete    Impression/Plan:  1. MRSA bacteremia - on daptomycin due to AKI.  Possible source foot/toe.   Repeat blood cultures ngtd.   Echo ordered MRI of foot when able TEE if no significant findings on TTE  2. AKI - multiple and now off of vancomycin/zosyn.  Creat has increased.  Continue to monitor.

## 2017-02-08 NOTE — Progress Notes (Signed)
ANTICOAGULATION CONSULT NOTE - Follow Up Consult  Pharmacy Consult for warfarin Indication: hx atrial fibrillation  No Known Allergies  Patient Measurements: Height: 6' (182.9 cm) Weight: 289 lb 0.4 oz (131.1 kg) IBW/kg (Calculated) : 77.6 Heparin Dosing Weight:   Vital Signs: Temp: 98 F (36.7 C) (04/30 0452) Temp Source: Oral (04/30 0452) BP: 122/59 (04/30 0452) Pulse Rate: 99 (04/30 0452)  Labs:  Recent Labs  02/06/17 0535 02/06/17 1806 02/07/17 0053 02/08/17 0553  HGB 10.7*  --  10.1* 8.8*  HCT 32.0*  --  31.7* 27.7*  PLT 242  --  260 240  LABPROT 83.4*  --  59.5* 41.1*  INR >10.00*  --  6.57* 4.13*  CREATININE 2.20*  --  2.62* 3.31*  CKTOTAL  --  60  --   --     Estimated Creatinine Clearance: 29.5 mL/min (A) (by C-G formula based on SCr of 3.31 mg/dL (H)).   Medications:  Home warfarin regimen: 5 mg daily except 2.5mg  on MWF  Assessment: Patient's a 71 y.o M with hx gout and afib on warfarin PTA, presented to the ED on 02/04/17 with c/o right wrist pain.  Warfarin resumed on admission.  INR on admission was supra-therapeuitc at 4.62  Significant events: - 4/28: daptomycin started for MRSA bacteremia: INR >10 with vit K 10 mg SQ given  Today, 02/08/2017: - INR remains supra-therapeutic at 4.13 (drawn at ~0600, last dose of dapto at 2200 on 4/29)  but is trending down.   - CBC stable - No bleeding documented - On regular diet - Drug-drug intxns: daptomycin can falsely prolong PT/increase INR -- need to draw INR prior to daptomycin dose (trough) to ensure accuracy   Goal of Therapy:  INR 2-3 Monitor platelets by anticoagulation protocol: Yes   Plan:  - Continue to hold warfarin today - will check INR at 8 PM tonight and tomorrow prior to daptomycin dose - Monitor for s/s bleeding  ______________________  - will change daptomycin to 800 mg IV q48h for renal insufficiency (next dose due on 02/09/17) - with renal insufficiency, will change CK  monitoring to every 3 days - monitor renal functionclosely  Dorna Leitz, PharmD, BCPS 02/08/2017 10:28 AM

## 2017-02-08 NOTE — Progress Notes (Signed)
Physical Therapy Treatment Patient Details Name: [name] MRN: [id number] DOB: Jul 28, 1946 Today's Date: 02/08/2017    History of Present Illness 71 y.o. male with medical history significant for gout, chronic diastolic CHF, dyslipidemia, atrial fibrillation on anticoagulation with Coumadin, morbid obesity and admitted for shingles and gout flare in R hand/wrist    PT Comments    Pt adamantly declined mobilizing despite encouragement however was agreeable to perform exercises in supine.  Pt with poor eccentric control during all exercises.  Pt also with intermittent difficulty maintained lifted UEs as well as holding cup of ice.  Pt reports he hasn't eaten and feels too weak to mobilize.  He perseverated on ice chips most of the session.    Follow Up Recommendations  SNF;Supervision/Assistance - 24 hour     Equipment Recommendations  None recommended by PT    Recommendations for Other Services       Precautions / Restrictions Precautions Precautions: Fall    Mobility  Bed Mobility               General bed mobility comments: pt refused mobility despite encouragement  Transfers                    Ambulation/Gait                 Stairs            Wheelchair Mobility    Modified Rankin (Stroke Patients Only)       Balance                                            Cognition Arousal/Alertness: Awake/alert Behavior During Therapy: WFL for tasks assessed/performed Overall Cognitive Status: Within Functional Limits for tasks assessed                                        Exercises General Exercises - Upper Extremity Shoulder Flexion: AROM;Both;10 reps Shoulder ADduction: AROM;Both;10 reps Elbow Flexion: AROM;Both;10 reps Elbow Extension: AROM;10 reps;Both Digit Composite Flexion: AROM;Both;10 reps Composite Extension: AROM;10 reps;Both General Exercises - Lower Extremity Ankle Circles/Pumps:  AROM;Both;10 reps Quad Sets: AROM;Both;10 reps Short Arc Quad: AROM;Both;10 reps Heel Slides: AAROM;Both;10 reps Hip ABduction/ADduction: AROM;Both;10 reps    General Comments        Pertinent Vitals/Pain Pain Assessment: 0-10 Pain Score: 8  Pain Location: R elbow Pain Descriptors / Indicators: Sore;Guarding;Discomfort Pain Intervention(s): Limited activity within patient's tolerance;Monitored during session;Patient requesting pain meds-RN notified    Home Living                      Prior Function            PT Goals (current goals can now be found in the care plan section) Progress towards PT goals: Progressing toward goals    Frequency           PT Plan Current plan remains appropriate    Co-evaluation              AM-PAC PT "6 Clicks" Daily Activity  Outcome Measure  Difficulty turning over in bed (including adjusting bedclothes, sheets and blankets)?: A Lot Difficulty moving from lying on back to sitting on the side of the bed? : A Lot Difficulty  sitting down on and standing up from a chair with arms (e.g., wheelchair, bedside commode, etc,.)?: A Lot Help needed moving to and from a bed to chair (including a wheelchair)?: A Lot Help needed walking in hospital room?: Total Help needed climbing 3-5 steps with a railing? : Total 6 Click Score: 10    End of Session   Activity Tolerance: Patient tolerated treatment well Patient left: with call bell/phone within reach;in bed;with bed alarm set   PT Visit Diagnosis: Muscle weakness (generalized) (M62.81)     Time: 2130-8657 PT Time Calculation (min) (ACUTE ONLY): 17 min  Charges:  $Therapeutic Exercise: 8-22 mins                    G Codes:       Zenovia Jarred, PT, DPT 02/08/2017 Pager: [phone]    Maida Sale E 02/08/2017, 3:16 PM

## 2017-02-08 NOTE — Progress Notes (Signed)
Notified by CCMD that pt is running VTach. Pt assessed and is asymptomatic at this time. NP on call, Schorr, paged.

## 2017-02-08 NOTE — Progress Notes (Signed)
Brief Pharmacy Note: Warfarin  See note by Dorna Leitz, PharmD, for full details. In brief, pharmacy is consulted to manage this patient's warfarin. Indication is history of atrial fibrillation. Patient's warfarin has been held since admission due to supratherapeutic INR.    INR this PM = 3.52, remains supratherapeutic, but trending down.   Plan:   Continue to hold warfarin tonight.   PT/INR scheduled for daily at 2000 to avoid potential drug/lab conflict with Daptomycin  Monitor for s/sx of bleeding.   Greer Pickerel, PharmD, BCPS Pager: [phone] 02/08/2017 8:46 PM

## 2017-02-08 NOTE — Progress Notes (Signed)
  Echocardiogram 2D Echocardiogram has been performed.  [name] R 02/08/2017, 1:16 PM

## 2017-02-09 DIAGNOSIS — R7881 Bacteremia: Secondary | ICD-10-CM

## 2017-02-09 DIAGNOSIS — R0600 Dyspnea, unspecified: Secondary | ICD-10-CM

## 2017-02-09 DIAGNOSIS — B351 Tinea unguium: Secondary | ICD-10-CM

## 2017-02-09 DIAGNOSIS — R0609 Other forms of dyspnea: Secondary | ICD-10-CM

## 2017-02-09 DIAGNOSIS — L97522 Non-pressure chronic ulcer of other part of left foot with fat layer exposed: Secondary | ICD-10-CM

## 2017-02-09 DIAGNOSIS — B9562 Methicillin resistant Staphylococcus aureus infection as the cause of diseases classified elsewhere: Secondary | ICD-10-CM

## 2017-02-09 DIAGNOSIS — B029 Zoster without complications: Secondary | ICD-10-CM

## 2017-02-09 DIAGNOSIS — R52 Pain, unspecified: Secondary | ICD-10-CM

## 2017-02-09 DIAGNOSIS — L97529 Non-pressure chronic ulcer of other part of left foot with unspecified severity: Secondary | ICD-10-CM

## 2017-02-09 LAB — CBC WITH DIFFERENTIAL/PLATELET
BASOS ABS: 0 10*3/uL (ref 0.0–0.1)
BASOS PCT: 0 %
EOS ABS: 0 10*3/uL (ref 0.0–0.7)
EOS PCT: 0 %
HCT: 25.5 % — ABNORMAL LOW (ref 39.0–52.0)
Hemoglobin: 8.4 g/dL — ABNORMAL LOW (ref 13.0–17.0)
LYMPHS PCT: 5 %
Lymphs Abs: 1 10*3/uL (ref 0.7–4.0)
MCH: 33.7 pg (ref 26.0–34.0)
MCHC: 32.9 g/dL (ref 30.0–36.0)
MCV: 102.4 fL — AB (ref 78.0–100.0)
Monocytes Absolute: 0.6 10*3/uL (ref 0.1–1.0)
Monocytes Relative: 3 %
Neutro Abs: 18.8 10*3/uL — ABNORMAL HIGH (ref 1.7–7.7)
Neutrophils Relative %: 92 %
PLATELETS: 250 10*3/uL (ref 150–400)
RBC: 2.49 MIL/uL — AB (ref 4.22–5.81)
RDW: 16.8 % — AB (ref 11.5–15.5)
WBC: 20.4 10*3/uL — AB (ref 4.0–10.5)

## 2017-02-09 LAB — CK: Total CK: 32 U/L — ABNORMAL LOW (ref 49–397)

## 2017-02-09 LAB — CULTURE, BLOOD (ROUTINE X 2)
CULTURE: NO GROWTH
Special Requests: ADEQUATE

## 2017-02-09 LAB — BASIC METABOLIC PANEL
ANION GAP: 10 (ref 5–15)
BUN: 88 mg/dL — AB (ref 6–20)
CALCIUM: 8.2 mg/dL — AB (ref 8.9–10.3)
CO2: 25 mmol/L (ref 22–32)
Chloride: 107 mmol/L (ref 101–111)
Creatinine, Ser: 3.9 mg/dL — ABNORMAL HIGH (ref 0.61–1.24)
GFR calc Af Amer: 17 mL/min — ABNORMAL LOW (ref >60)
GFR, EST NON AFRICAN AMERICAN: 14 mL/min — AB (ref >60)
Glucose, Bld: 109 mg/dL — ABNORMAL HIGH (ref 65–99)
POTASSIUM: 4.6 mmol/L (ref 3.5–5.1)
SODIUM: 142 mmol/L (ref 135–145)

## 2017-02-09 LAB — GLUCOSE, CAPILLARY: Glucose-Capillary: 105 mg/dL — ABNORMAL HIGH (ref 65–99)

## 2017-02-09 MED ORDER — SODIUM CHLORIDE 0.9 % IV SOLN
800.0000 mg | INTRAVENOUS | Status: DC
  Filled 2017-02-09: qty 16

## 2017-02-09 MED ORDER — LORAZEPAM 2 MG/ML IJ SOLN: 1.0000 mg | INTRAMUSCULAR | Status: DC | PRN

## 2017-02-09 MED ORDER — MORPHINE SULFATE (PF) 4 MG/ML IV SOLN
2.0000 mg | Freq: Four times a day (QID) | INTRAVENOUS | Status: DC
  Administered 2017-02-09 – 2017-02-10 (×4): 2 mg via INTRAVENOUS
  Filled 2017-02-09 (×4): qty 1

## 2017-02-09 MED ORDER — MORPHINE SULFATE (PF) 4 MG/ML IV SOLN
2.0000 mg | INTRAVENOUS | Status: DC | PRN
  Administered 2017-02-09: 2 mg via INTRAVENOUS
  Filled 2017-02-09: qty 1

## 2017-02-09 MED ORDER — SODIUM CHLORIDE 0.9 % IV SOLN: 800.0000 mg | INTRAVENOUS | Status: DC

## 2017-02-09 NOTE — Progress Notes (Signed)
Daily Progress Note   Patient Name: [name]       Date: 02/09/2017 DOB: 01-25-1946  Age: 71 y.o. MRN#: [id number] Attending Physician: Jordan Hawks* Primary Care Physician: [facility] Admit Date: 02/04/2017  Reason for Consultation/Follow-up: Establishing goals of care, Non pain symptom management, Pain control and Psychosocial/spiritual support  Subjective:  -family called to bedside 2/2 to significant rapid decline as discussed with Dr Alvino Chapel  - I meet with brother Gene Armas/main decision maker.  Discussed diagnosis, prognosis, and GOC as patient transitions at EOL  -focus is comfort and dignity  Length of Stay: 5  Current Medications: Scheduled Meds:  . sodium chloride flush  3 mL Intravenous Q12H    Continuous Infusions:   PRN Meds: acetaminophen **OR** acetaminophen, LORazepam, morphine injection, ondansetron **OR** ondansetron (ZOFRAN) IV, promethazine  Physical Exam  Constitutional: He appears cachectic. He appears ill.  Cardiovascular: Tachycardia present.   Pulmonary/Chest: He has decreased breath sounds in the right lower field and the left lower field.  Abdominal: He exhibits distension.  Neurological: He is unresponsive.  Skin: Skin is warm and dry.  -noted rash per EMR            Vital Signs: BP (!) 107/58 (BP Location: Right Arm)   Pulse (!) 101   Temp 98.3 F (36.8 C) (Oral)   Resp 16   Ht 6' (1.829 m)   Wt 130.9 kg (288 lb 9.3 oz)   SpO2 95%   BMI 39.14 kg/m  SpO2: SpO2: 95 % O2 Device: O2 Device: Not Delivered O2 Flow Rate: O2 Flow Rate (L/min): 2 L/min  Intake/output summary:  Intake/Output Summary (Last 24 hours) at 02/09/17 1222 Last data filed at 02/09/17 0446  Gross per 24 hour  Intake                 3 ml  Output              325 ml  Net             -322 ml   LBM: Last BM Date: 02/04/17 Baseline Weight: Weight: 128.6 kg (283 lb 8.2 oz) Most recent weight: Weight: 130.9 kg (288 lb 9.3 oz)       Palliative Assessment/Data: 10 %    Flowsheet Rows  Most Recent Value  Intake Tab  Referral Department  Hospitalist  Unit at Time of Referral  Med/Surg Unit  Palliative Care Primary Diagnosis  Cardiac  Date Notified  02/07/17  Palliative Care Type  New Palliative care  Reason for referral  Clarify Goals of Care  Date of Admission  02/04/17  Date first seen by Palliative Care  02/08/17  # of days Palliative referral response time  1 Day(s)  # of days IP prior to Palliative referral  3  Clinical Assessment  Palliative Performance Scale Score  30%  Psychosocial & Spiritual Assessment  Palliative Care Outcomes      Patient Active Problem List   Diagnosis Date Noted  . Herpes zoster without complication   . MRSA bacteremia   . Skin ulcer of left foot with fat layer exposed (HCC)   . Palliative care by specialist   . AKI (acute kidney injury) (HCC)   . DNR (do not resuscitate)   . Pressure injury of skin 02/05/2017  . Malnutrition of moderate degree 02/05/2017  . Gout flare 02/04/2017  . Hypertensive heart disease with heart failure (HCC)   . Acute on chronic diastolic heart failure (HCC)   . V-tach (HCC) 10/31/2016  . CHF exacerbation (HCC) 10/30/2016  . CHF (congestive heart failure) (HCC) 10/30/2016  . Bradycardia 03/16/2016  . Alcohol abuse 03/16/2016  . Acute encephalopathy 03/16/2016  . Alcohol withdrawal delirium (HCC) 03/16/2016  . Cor, pulmonale, acute (HCC)   . CAP (community acquired pneumonia) 03/06/2016  . Adult failure to thrive 03/05/2016  . Weakness of both lower extremities 03/04/2016  . Morbid obesity - BMI 45 10/23/2015  . Current use of long term anticoagulation 10/23/2015  . 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 Diastolic  dysfunction with acute on chronic heart failure (HCC) 10/23/2015  . 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 Recurrent falls 10/20/2015  . Physical deconditioning 10/20/2015  . Hyperkalemia 10/20/2015  . Fall   . Streptococcus viridans infection 02/13/2015  . Sepsis (HCC) 02/07/2015  . Anemia of chronic disease 02/07/2015  . Alcoholic hepatitis 02/07/2015  . Thrombocytopenia (HCC) 02/07/2015  . Elevated troponin 02/06/2015  . Hematoma 04/24/2013  . Chronic atrial fibrillation (HCC) 04/24/2013  . Acute respiratory failure with hypoxia (HCC) 04/23/2013  . Cellulitis 04/23/2013  . Acute on chronic systolic congestive heart failure (HCC) 04/22/2013  . Essential hypertension 12/06/2009    Palliative Care Assessment & Plan   Patient Profile: 71 y.o. male  admitted on 02/04/2017 with past medical history significant for gout, cirrhosis, chronic diastolic CHF, last 2-D echo in January 2018 showed ejection fraction 55% and grade 3 diastolic dysfunction, dyslipidemia, atrial fibrillation on anticoagulation with Coumadin.   Assessment: - transitioning at EOL  Recommendations/Plan:  Comfort and digntity, no life prolonging interventions  Symptom management         ( Pain/Dyspnea): Morphine 2 mg IV every 6 hrs scheduled and Morphine 2 mg IV        every 1 hr prn             (agitation)- Ativan 1 mg IV every 4 hrs prn  Goals of Care and Additional Recommendations:  Limitations on Scope of Treatment: Full Comfort Care  Code Status:    Code Status Orders        Start     Ordered   02/08/17 1052  Do not attempt resuscitation (DNR)  Continuous    Question Answer Comment  In the event of cardiac  or respiratory ARREST Do not call a "code blue"   In the event of cardiac or respiratory ARREST Do not perform Intubation, CPR, defibrillation or ACLS   In the event of cardiac or respiratory ARREST Use medication by any route, position, wound care, and other measures to relive pain and suffering. May  use oxygen, suction and manual treatment of airway obstruction as needed for comfort.      02/08/17 1051    Code Status History    Date Active Date Inactive Code Status Order ID Comments User Context   02/04/2017 12:58 PM 02/08/2017 10:51 AM Full Code [id number]  Alison Murray, MD Inpatient   10/30/2016  1:38 AM 11/04/2016  7:46 PM DNR [id number]  Clydie Braun, MD ED   03/06/2016  3:21 PM 03/16/2016  9:59 PM DNR [id number]  Jeralyn Bennett, MD Inpatient   03/04/2016 10:10 PM 03/06/2016  3:21 PM Full Code [id number]  Ron Parker, MD Inpatient   10/20/2015  5:32 PM 10/25/2015  8:40 PM DNR [id number]  Ozella Rocks, MD ED   02/06/2015  1:30 PM 02/13/2015  6:08 PM Full Code [id number]  Dorothea Ogle, MD ED   04/23/2013  3:05 AM 04/25/2013  8:16 PM Full Code [id number]  Christiane Ha, MD Inpatient   04/11/2013 12:44 PM 04/14/2013  2:13 PM Full Code [id number]  Alba Cory, MD Inpatient    Advance Directive Documentation     Most Recent Value  Type of Advance Directive  Healthcare Power of Attorney, Out of facility DNR (pink MOST or yellow form)  Pre-existing out of facility DNR order (yellow form or pink MOST form)  Yellow form placed in chart (order not valid for inpatient use)  "MOST" Form in Place?  -       Prognosis:   Hours - Days  Discharge Planning:   PMT will f/u in the morning  Anticipated Hospital Death  Care plan was discussed with Dr Albin Felling  Thank you for allowing the Palliative Medicine Team to assist in the care of this patient.   Time In: 1130 Time Out: 1205 Total Time 35 min Prolonged Time Billed  no       Greater than 50%  of this time was spent counseling and coordinating care related to the above assessment and plan.  Lorinda Creed, NP  Please contact Palliative Medicine Team phone at [phone] for questions and concerns.

## 2017-02-09 NOTE — Progress Notes (Signed)
Subjective:  Pt confused   Antibiotics:  Anti-infectives    Start     Dose/Rate Route Frequency Ordered Stop   02/09/17 2200  DAPTOmycin (CUBICIN) 800 mg in sodium chloride 0.9 % IVPB  Status:  Discontinued     800 mg 232 mL/hr over 30 Minutes Intravenous Every 48 hours 02/08/17 1040 02/09/17 0759   02/09/17 2200  DAPTOmycin (CUBICIN) 800 mg in sodium chloride 0.9 % IVPB  Status:  Discontinued     800 mg 232 mL/hr over 30 Minutes Intravenous Every 48 hours 02/09/17 0815 02/09/17 0945   02/09/17 1400  DAPTOmycin (CUBICIN) 800 mg in sodium chloride 0.9 % IVPB  Status:  Discontinued     800 mg 232 mL/hr over 30 Minutes Intravenous Every 48 hours 02/09/17 0759 02/09/17 0815   02/09/17 1000  valACYclovir (VALTREX) tablet 1,000 mg  Status:  Discontinued     1,000 mg Oral Daily 02/08/17 1300 02/09/17 0945   02/07/17 2000  valACYclovir (VALTREX) tablet 500 mg  Status:  Discontinued     500 mg Oral Daily 02/07/17 1714 02/08/17 1300   02/07/17 1000  acyclovir (ZOVIRAX) 985 mg in dextrose 5 % 150 mL IVPB  Status:  Discontinued     10 mg/kg  98.4 kg (Adjusted) 169.7 mL/hr over 60 Minutes Intravenous Every 12 hours 02/07/17 0813 02/07/17 1708   02/07/17 0015  piperacillin-tazobactam (ZOSYN) IVPB 3.375 g  Status:  Discontinued     3.375 g 12.5 mL/hr over 240 Minutes Intravenous Every 8 hours 02/07/17 0008 02/07/17 1655   02/06/17 2200  vancomycin (VANCOCIN) 1,250 mg in sodium chloride 0.9 % 250 mL IVPB  Status:  Discontinued     1,250 mg 166.7 mL/hr over 90 Minutes Intravenous Every 24 hours 02/06/17 0406 02/06/17 0413   02/06/17 2200  vancomycin (VANCOCIN) 1,500 mg in sodium chloride 0.9 % 500 mL IVPB  Status:  Discontinued     1,500 mg 250 mL/hr over 120 Minutes Intravenous Daily at bedtime 02/06/17 0413 02/06/17 1745   02/06/17 2200  DAPTOmycin (CUBICIN) 800 mg in sodium chloride 0.9 % IVPB  Status:  Discontinued     800 mg 232 mL/hr over 30 Minutes Intravenous Every 24 hours  02/06/17 1806 02/08/17 1040   02/06/17 0415  vancomycin (VANCOCIN) 1,500 mg in sodium chloride 0.9 % 500 mL IVPB     1,500 mg 250 mL/hr over 120 Minutes Intravenous  Once 02/06/17 0406 02/06/17 0703   02/05/17 0615  acyclovir (ZOVIRAX) tablet 800 mg  Status:  Discontinued     800 mg Oral 5 times daily 02/05/17 0613 02/07/17 0813   02/04/17 1400  clindamycin (CLEOCIN) IVPB 600 mg  Status:  Discontinued     600 mg 100 mL/hr over 30 Minutes Intravenous Every 8 hours 02/04/17 1027 02/06/17 0358   02/04/17 1045  acyclovir (ZOVIRAX) 200 MG capsule 800 mg  Status:  Discontinued     800 mg Oral 5 times daily 02/04/17 1036 02/05/17 0614   02/04/17 0745  vancomycin (VANCOCIN) IVPB 1000 mg/200 mL premix     1,000 mg 200 mL/hr over 60 Minutes Intravenous  Once 02/04/17 0736 02/04/17 1022   02/04/17 0745  piperacillin-tazobactam (ZOSYN) IVPB 3.375 g     3.375 g 100 mL/hr over 30 Minutes Intravenous  Once 02/04/17 0736 02/04/17 0915      Medications: Scheduled Meds: . sodium chloride flush  3 mL Intravenous Q12H   Continuous Infusions: PRN Meds:.acetaminophen **OR** acetaminophen, LORazepam, morphine  injection, ondansetron **OR** ondansetron (ZOFRAN) IV, promethazine    Objective: Weight change: -7.1 oz (-0.2 kg)  Intake/Output Summary (Last 24 hours) at 02/09/17 0953 Last data filed at 02/09/17 0446  Gross per 24 hour  Intake                3 ml  Output              325 ml  Net             -322 ml   Blood pressure (!) 107/58, pulse (!) 101, temperature 98.3 F (36.8 C), temperature source Oral, resp. rate 16, height 6' (1.829 m), weight 288 lb 9.3 oz (130.9 kg), SpO2 95 %. Temp:  [98.3 F (36.8 C)-98.8 F (37.1 C)] 98.3 F (36.8 C) (05/01 0438) Pulse Rate:  [100-102] 101 (05/01 0438) Resp:  [16-18] 16 (05/01 0438) BP: (107-129)/(58-63) 107/58 (05/01 0438) SpO2:  [91 %-95 %] 95 % (05/01 0438) Weight:  [288 lb 9.3 oz (130.9 kg)] 288 lb 9.3 oz (130.9 kg) (05/01 0447)  Physical  Exam: General: awake confused HEENT:  EOMI CVS tachy ate, normal r,  no murmur rubs or gallops Chest: clear to auscultation bilaterally, no wheezing, rales or rhonchi Abdomen: soft  nondistended, normal bowel sounds, Extremities: tenderness elbow and hand Skin: zoster rash with crusting superiorly 02/09/17:  Feet with onychomycosis, left foot wrapped with ulcer dressed    Note scabbing in this area below       Neuro: nonfocal  CBC: CBC Latest Ref Rng & Units 02/09/2017 02/08/2017 02/07/2017  WBC 4.0 - 10.5 K/uL 20.4(H) 19.0(H) 15.3(H)  Hemoglobin 13.0 - 17.0 g/dL 1.6(X) 0.9(U) 10.1(L)  Hematocrit 39.0 - 52.0 % 25.5(L) 27.7(L) 31.7(L)  Platelets 150 - 400 K/uL 250 240 260      BMET  Recent Labs  02/08/17 0553 02/09/17 0425  NA 141 142  K 4.3 4.6  CL 106 107  CO2 24 25  GLUCOSE 116* 109*  BUN 102* 88*  CREATININE 3.31* 3.90*  CALCIUM 8.0* 8.2*     Liver Panel   Recent Labs  02/07/17 0053  PROT 6.7  ALBUMIN 2.5*  AST 29  ALT 14*  ALKPHOS 95  BILITOT 2.4*  BILIDIR 1.1*  IBILI 1.3*       Sedimentation Rate No results for input(s): ESRSEDRATE in the last 72 hours. C-Reactive Protein No results for input(s): CRP in the last 72 hours.  Micro Results: Recent Results (from the past 720 hour(s))  Culture, blood (routine x 2)     Status: Abnormal   Collection Time: 02/04/17  8:30 AM  Result Value Ref Range Status   Specimen Description BLOOD LEFT FOREARM  Final   Special Requests   Final    BOTTLES DRAWN AEROBIC AND ANAEROBIC Blood Culture adequate volume   Culture  Setup Time   Final    ANAEROBIC BOTTLE ONLY GRAM POSITIVE COCCI IN CLUSTERS CRITICAL RESULT CALLED TO, READ BACK BY AND VERIFIED WITH: TO JGRIMSLEY(PHARMd) BY TCLEVELAND 02/05/2017 AT 3:53AM Performed at [facility], [address] [place], [place] [address]    Culture METHICILLIN RESISTANT STAPHYLOCOCCUS AUREUS (A)  Final   Report Status 02/08/2017 FINAL  Final   Organism ID,  Bacteria METHICILLIN RESISTANT STAPHYLOCOCCUS AUREUS  Final      Susceptibility   Methicillin resistant staphylococcus aureus - MIC*    CIPROFLOXACIN >=8 RESISTANT Resistant     ERYTHROMYCIN 1 INTERMEDIATE Intermediate     GENTAMICIN <=0.5 SENSITIVE Sensitive  OXACILLIN >=4 RESISTANT Resistant     TETRACYCLINE <=1 SENSITIVE Sensitive     VANCOMYCIN <=0.5 SENSITIVE Sensitive     TRIMETH/SULFA <=10 SENSITIVE Sensitive     CLINDAMYCIN <=0.25 SENSITIVE Sensitive     RIFAMPIN <=0.5 SENSITIVE Sensitive     Inducible Clindamycin NEGATIVE Sensitive     * METHICILLIN RESISTANT STAPHYLOCOCCUS AUREUS  Blood Culture ID Panel (Reflexed)     Status: Abnormal   Collection Time: 02/04/17  8:30 AM  Result Value Ref Range Status   Enterococcus species NOT DETECTED NOT DETECTED Final   Listeria monocytogenes NOT DETECTED NOT DETECTED Final   Staphylococcus species DETECTED (A) NOT DETECTED Final    Comment: CRITICAL RESULT CALLED TO, READ BACK BY AND VERIFIED WITH: TO TO JGRIMSLEY(PHARMd0 BY TCLEVELAND 03/07/2017 AT 3:53AM    Staphylococcus aureus DETECTED (A) NOT DETECTED Final    Comment: Methicillin (oxacillin)-resistant Staphylococcus aureus (MRSA). MRSA is predictably resistant to beta-lactam antibiotics (except ceftaroline). Preferred therapy is vancomycin unless clinically contraindicated. Patient requires contact precautions if  hospitalized. CRITICAL RESULT CALLED TO, READ BACK BY AND VERIFIED WITH: TO TO JGRIMSLEY(PHARMd0 BY [facility] 03/07/2017 AT 3:53AM    Methicillin resistance DETECTED (A) NOT DETECTED Final    Comment: CRITICAL RESULT CALLED TO, READ BACK BY AND VERIFIED WITH: TO TO JGRIMSLEY(PHARMd0 BY TCLEVELAND 03/07/2017 AT 3:53AM    Streptococcus species NOT DETECTED NOT DETECTED Final   Streptococcus agalactiae NOT DETECTED NOT DETECTED Final   Streptococcus pneumoniae NOT DETECTED NOT DETECTED Final   Streptococcus pyogenes NOT DETECTED NOT DETECTED Final   Acinetobacter  baumannii NOT DETECTED NOT DETECTED Final   Enterobacteriaceae species NOT DETECTED NOT DETECTED Final   Enterobacter cloacae complex NOT DETECTED NOT DETECTED Final   Escherichia coli NOT DETECTED NOT DETECTED Final   Klebsiella oxytoca NOT DETECTED NOT DETECTED Final   Klebsiella pneumoniae NOT DETECTED NOT DETECTED Final   Proteus species NOT DETECTED NOT DETECTED Final   Serratia marcescens NOT DETECTED NOT DETECTED Final   Haemophilus influenzae NOT DETECTED NOT DETECTED Final   Neisseria meningitidis NOT DETECTED NOT DETECTED Final   Pseudomonas aeruginosa NOT DETECTED NOT DETECTED Final   Candida albicans NOT DETECTED NOT DETECTED Final   Candida glabrata NOT DETECTED NOT DETECTED Final   Candida krusei NOT DETECTED NOT DETECTED Final   Candida parapsilosis NOT DETECTED NOT DETECTED Final   Candida tropicalis NOT DETECTED NOT DETECTED Final    Comment: Performed at [facility], [address] [place], [place] [address]  Culture, blood (routine x 2)     Status: None   Collection Time: 02/04/17  8:32 AM  Result Value Ref Range Status   Specimen Description BLOOD LEFT HAND  Final   Special Requests   Final    BOTTLES DRAWN AEROBIC AND ANAEROBIC Blood Culture adequate volume   Culture   Final    NO GROWTH 5 DAYS Performed at [facility], [address], [place], [place] [address]    Report Status 02/09/2017 FINAL  Final  Culture, blood (routine x 2)     Status: None (Preliminary result)   Collection Time: 02/06/17 11:03 AM  Result Value Ref Range Status   Specimen Description BLOOD LEFT HAND  Final   Special Requests   Final    BOTTLES DRAWN AEROBIC AND ANAEROBIC Blood Culture adequate volume   Culture   Final    NO GROWTH 3 DAYS Performed at [facility], [address], [place], [place] [address]  Report Status PENDING  Incomplete  Culture, blood (routine x 2)     Status: None (Preliminary result)   Collection Time: 02/06/17 11:04 AM  Result Value  Ref Range Status   Specimen Description BLOOD RIGHT FOREARM  Final   Special Requests   Final    BOTTLES DRAWN AEROBIC AND ANAEROBIC Blood Culture adequate volume   Culture   Final    NO GROWTH 3 DAYS Performed at [facility], [address], [place], [place] [address]    Report Status PENDING  Incomplete    Studies/Results: No results found.    Assessment/Plan:  INTERVAL HISTORY: patients renal fxn continues to decline thought to have possible cardiorenal syndrome felt to be poor HD candidate, Palliative care consult recommended   Active Problems:   Gout flare   Pressure injury of skin   Malnutrition of moderate degree   Palliative care by specialist   AKI (acute kidney injury) (HCC)   DNR (do not resuscitate)    [name] is a 71 y.o. male admitted with what was thought to be gout flare and found to have MRSA bacteremia, zoster now with worsening renal failure in setting of possible cardiorenal syndrome + nephrotoxic agents including vancomycin. He is felt to be poor HD candidate. He has ulcer in foot.   #1 MRSA bacteremia:  --currently on daptomicin --repeat blood cutures incubating --no central lines in place --foot is likely source --metastatic infection in joints thought to be due to gout is possible  #2 Zoster: dc airborne, continue contact precautions continue acyclovir  #3 Worsening renal failure: undersand not good HD candidate and may go to palliative care   I will follow peripherally and engage more aggressively if aggressive care is desired.        LOS: 5 days   Acey Lav 02/09/2017, 9:53 AM

## 2017-02-09 NOTE — Progress Notes (Addendum)
ANTICOAGULATION CONSULT NOTE - Follow Up Consult  Pharmacy Consult for warfarin Indication: hx atrial fibrillation  No Known Allergies  Patient Measurements: Height: 6' (182.9 cm) Weight: 288 lb 9.3 oz (130.9 kg) IBW/kg (Calculated) : 77.6  Vital Signs: Temp: 98.3 F (36.8 C) (05/01 0438) Temp Source: Oral (05/01 0438) BP: 107/58 (05/01 0438) Pulse Rate: 101 (05/01 0438)  Labs:  Recent Labs  02/06/17 1806  02/07/17 0053 02/08/17 0553 02/08/17 1958 02/09/17 0425  HGB  --   < > 10.1* 8.8*  --  8.4*  HCT  --   --  31.7* 27.7*  --  25.5*  PLT  --   --  260 240  --  250  LABPROT  --   --  59.5* 41.1* 36.1*  --   INR  --   --  6.57* 4.13* 3.52  --   CREATININE  --   --  2.62* 3.31*  --  3.90*  CKTOTAL 60  --   --   --   --  32*  < > = values in this interval not displayed.  Estimated Creatinine Clearance: 25 mL/min (A) (by C-G formula based on SCr of 3.9 mg/dL (H)).   Medications:  Home warfarin regimen: 5 mg daily except 2.5mg  on MWF  Assessment: Patient's a 71 y.o M with hx gout and afib on warfarin PTA, presented to the ED on 02/04/17 with c/o right wrist pain.  Warfarin resumed on admission.  INR on admission was supra-therapeuitc at 4.62  Significant events:  4/28: daptomycin started for MRSA bacteremia: INR >10 with vit K 10 mg SQ given  Today, 02/09/2017:  INR remains supra-therapeutic at  but is trending down. CBC stable  No bleeding documented  On regular diet, eating 0% of meals (charted)  Drug-drug intxns: daptomycin can falsely prolong PT/increase INR -- need to draw INR prior to daptomycin dose (trough) to ensure accuracy   Goal of Therapy:  INR 2-3 Monitor platelets by anticoagulation protocol: Yes   Plan:   Hold warfarin today  Monitor for s/s bleeding  Bernadene Person, PharmD, BCPS Pager: [phone] 02/09/2017, 8:16 AM

## 2017-02-09 NOTE — Progress Notes (Signed)
Nutrition Brief Note  Chart reviewed. Pt now transitioning to comfort care.  No further nutrition interventions warranted at this time.  Please re-consult as needed.   Betsey Holiday, RD, LDN Pager #- [phone]

## 2017-02-09 NOTE — Progress Notes (Signed)
Pt is now full comfort care, will sign off.    Vinson Moselle MD [employer] 02/09/2017, 10:20 AM

## 2017-02-09 NOTE — Progress Notes (Addendum)
PROGRESS NOTE    [name]  ZOX:[id number] DOB: 1946-08-04 DOA: 02/04/2017 PCP: [facility]     Brief Narrative:  [name] is a 71 y.o. male with medical history significant for gout, chronic diastolic CHF, last 2-D echo in January 2018 showed ejection fraction 55% and grade 3 diastolic dysfunction, dyslipidemia, atrial fibrillation on anticoagulation with Coumadin, alcohol abuse. Patient presented to [facility] long with worsening pain and redness and swelling in the right hand and his wrist. He reports taking allopurinol as prescribed and has not had gout flareup in a while but over past 3 days he was trying to get up from the bed and he mostly uses a right hand and this time the pain was getting worse and he could not get himself up. Patient reports no fevers. No reports of chills. He does have a rash over his right shoulder and chest but reports no pain over that area. He is not sure for how long he has had about a rash that has been there for at least a week or so. He was admitted for further treatment of his right hand gout, right chest shingles, and left foot ulcer.  He was treated for gout with allopurinol and colchicine, shingles with IV acyclovir. He was also found to have MRSA bacteremia for which he was initially started on vancomycin, then switched to daptomycin. Patient's leukocytosis as well as kidney function continued to worsen. Volume status has been difficult to manage due to CHF, cirrhosis, as well as renal failure. He had very poor urine output as well despite lasix IV. He developed ileus in the hospital and was kept nothing by mouth. Multiple specialists were involved including infectious disease, cardiology, nephrology. Due to clinical worsening, palliative care was consulted. Family ultimately decided comfort care.  Assessment & Plan:   Active Problems:   Gout flare   Pressure injury of skin   Malnutrition of moderate degree  Palliative care by specialist   AKI (acute kidney injury) (HCC)   DNR (do not resuscitate)   Gout flare, right wrist and hand with likely overlying cellulitis - Resolved   MRSA bacteremia - Repeat blood cultures 4/28 negative thus far  - Possible source is left foot ulcer  - Treated with vanco then daptomycin  - ID following   Shingles over right chest and right shoulder  - Airborne precaution - Changed to valtrex in setting of worsening kidney failure   Acute on chronic diastolic CHF - 2-D echo in January 2018 showed ejection fraction of 55% with grade 3 diastolic dysfunction - Worsening edema, anasarca, SOB, now requiring [place] O2, BNP 1599  - Cardiology consulted, no further cardiac recommendations, recommending palliative care   Acute hypoxemic respiratory failure - Combination of CHF and possibly aspiration  Paralytic ileus - Vomiting, AXR and CT abdomen obtained which showed ileus without obstruction  AKI on CKD stage 3 - Baseline creatinine 2017 was 1.3 - Renal US without hydronephrosis - Worsening in setting of various medications, heart failure, cirrhosis. Nephrology following, recommending palliative care   Cirrhosis, likely alcoholic  - CT abd/pelvis revealed mild cirrhosis, ascites  - Contributing to his overall volume status and poor prognosis   Supratherapeutic INR - Vit K 10mg  on 4/28  - Continue to hold coumadin  Left foot ulcer - Appreciate wound consultation - May be source of MRSA bacteremia per ID  Asymptomatic pyuria - No dysuria per report   Dyslipidemia - Holding pravachol in setting of  daptomycin use   Atrial fibrillation - CHADS vasc score at least 2 - On anticoagulation with Coumadin; INR supratherapeutic so Coumadin will be held  - Currently regular rhythm   Chronic venous stasis bilaterally in lower extremities - Appreciate wound care recommendations  Alcohol abuse - Brother reports 48oz alcohol daily - CIWA   Goals of  care - Palliative care consulted - Patient worsening today, much more lethargic and also with dark emesis. They had planned for family meeting with palliative care this afternoon, but I called and discussed with brother patient's worsening status. I do think he has days if not hours of life. Brother would like to proceed with full comfort care today. He has also called their other brother from [place] as well as updated their 53 yo father.    DVT prophylaxis: holding coumadin due to supratherapeutic INR Code Status: DNR  Family Communication: brother and sister-in-law  Disposition Plan: very poor prognosis, full comfort care    Consultants:   ID  Cardiology   Nephrology  Palliative care   Procedures:   None  Antimicrobials:  Anti-infectives    Start     Dose/Rate Route Frequency Ordered Stop   02/09/17 2200  DAPTOmycin (CUBICIN) 800 mg in sodium chloride 0.9 % IVPB  Status:  Discontinued     800 mg 232 mL/hr over 30 Minutes Intravenous Every 48 hours 02/08/17 1040 02/09/17 0759   02/09/17 2200  DAPTOmycin (CUBICIN) 800 mg in sodium chloride 0.9 % IVPB     800 mg 232 mL/hr over 30 Minutes Intravenous Every 48 hours 02/09/17 0815     02/09/17 1400  DAPTOmycin (CUBICIN) 800 mg in sodium chloride 0.9 % IVPB  Status:  Discontinued     800 mg 232 mL/hr over 30 Minutes Intravenous Every 48 hours 02/09/17 0759 02/09/17 0815   02/09/17 1000  valACYclovir (VALTREX) tablet 1,000 mg     1,000 mg Oral Daily 02/08/17 1300     02/07/17 2000  valACYclovir (VALTREX) tablet 500 mg  Status:  Discontinued     500 mg Oral Daily 02/07/17 1714 02/08/17 1300   02/07/17 1000  acyclovir (ZOVIRAX) 985 mg in dextrose 5 % 150 mL IVPB  Status:  Discontinued     10 mg/kg  98.4 kg (Adjusted) 169.7 mL/hr over 60 Minutes Intravenous Every 12 hours 02/07/17 0813 02/07/17 1708   02/07/17 0015  piperacillin-tazobactam (ZOSYN) IVPB 3.375 g  Status:  Discontinued     3.375 g 12.5 mL/hr over 240 Minutes  Intravenous Every 8 hours 02/07/17 0008 02/07/17 1655   02/06/17 2200  vancomycin (VANCOCIN) 1,250 mg in sodium chloride 0.9 % 250 mL IVPB  Status:  Discontinued     1,250 mg 166.7 mL/hr over 90 Minutes Intravenous Every 24 hours 02/06/17 0406 02/06/17 0413   02/06/17 2200  vancomycin (VANCOCIN) 1,500 mg in sodium chloride 0.9 % 500 mL IVPB  Status:  Discontinued     1,500 mg 250 mL/hr over 120 Minutes Intravenous Daily at bedtime 02/06/17 0413 02/06/17 1745   02/06/17 2200  DAPTOmycin (CUBICIN) 800 mg in sodium chloride 0.9 % IVPB  Status:  Discontinued     800 mg 232 mL/hr over 30 Minutes Intravenous Every 24 hours 02/06/17 1806 02/08/17 1040   02/06/17 0415  vancomycin (VANCOCIN) 1,500 mg in sodium chloride 0.9 % 500 mL IVPB     1,500 mg 250 mL/hr over 120 Minutes Intravenous  Once 02/06/17 0406 02/06/17 0703   02/05/17 0615  acyclovir (ZOVIRAX) tablet 800  mg  Status:  Discontinued     800 mg Oral 5 times daily 02/05/17 0613 02/07/17 0813   02/04/17 1400  clindamycin (CLEOCIN) IVPB 600 mg  Status:  Discontinued     600 mg 100 mL/hr over 30 Minutes Intravenous Every 8 hours 02/04/17 1027 02/06/17 0358   02/04/17 1045  acyclovir (ZOVIRAX) 200 MG capsule 800 mg  Status:  Discontinued     800 mg Oral 5 times daily 02/04/17 1036 02/05/17 0614   02/04/17 0745  vancomycin (VANCOCIN) IVPB 1000 mg/200 mL premix     1,000 mg 200 mL/hr over 60 Minutes Intravenous  Once 02/04/17 0736 02/04/17 1022   02/04/17 0745  piperacillin-tazobactam (ZOSYN) IVPB 3.375 g     3.375 g 100 mL/hr over 30 Minutes Intravenous  Once 02/04/17 0736 02/04/17 0915         Subjective: Lethargic, moaning, states he is in pain but unable to tell me where    Objective: Vitals:   02/08/17 1852 02/08/17 2110 02/09/17 0438 02/09/17 0447  BP: (!) 129/58 123/63 (!) 107/58   Pulse: (!) 102 100 (!) 101   Resp: 18 18 16    Temp: 98.8 F (37.1 C) 98.4 F (36.9 C) 98.3 F (36.8 C)   TempSrc:  Oral Oral   SpO2: 91% 91%  95%   Weight:    130.9 kg (288 lb 9.3 oz)  Height:        Intake/Output Summary (Last 24 hours) at 02/09/17 0939 Last data filed at 02/09/17 0446  Gross per 24 hour  Intake                3 ml  Output              325 ml  Net             -322 ml   Filed Weights   02/07/17 0600 02/08/17 0452 02/09/17 0447  Weight: 129.7 kg (285 lb 15 oz) 131.1 kg (289 lb 0.4 oz) 130.9 kg (288 lb 9.3 oz)    Examination:  General exam: Very ill appearing  Respiratory system: Clear to auscultation, diminished bases. Respiratory effort normal.  Cardiovascular system: S1 & S2 heard, Tachycardic, regular. No JVD, murmurs, rubs, gallops or clicks. +anasarca  Gastrointestinal system: Abdomen is nondistended, soft  Central nervous system: Alert but confused  Extremities: right wrist and hand gout has significantly improved in appearance, much less edema and no erythema  Skin: +crusting vesicular rash right chest, right shoulder, right upper back, +ulcer on plantar aspect left foot, +bilateral chronic venous stasis   Data Reviewed: I have personally reviewed following labs and imaging studies  CBC:  Recent Labs Lab 02/04/17 0832 02/05/17 0550 02/06/17 0535 02/07/17 0053 02/08/17 0553 02/09/17 0425  WBC 12.0* 11.2* 12.6* 15.3* 19.0* 20.4*  NEUTROABS 10.6*  --  11.2* 14.2* 17.7* 18.8*  HGB 10.0* 10.3* 10.7* 10.1* 8.8* 8.4*  HCT 30.2* 30.9* 32.0* 31.7* 27.7* 25.5*  MCV 102.0* 101.0* 100.9* 103.6* 104.5* 102.4*  PLT 209 228 242 260 240 250   Basic Metabolic Panel:  Recent Labs Lab 02/05/17 0550 02/06/17 0535 02/07/17 0053 02/08/17 0553 02/09/17 0425  NA 142 141 141 141 142  K 3.6 3.8 4.1 4.3 4.6  CL 106 106 105 106 107  CO2 25 24 24 24 25   GLUCOSE 91 103* 118* 116* 109*  BUN 63* 66* 77* 102* 88*  CREATININE 1.94* 2.20* 2.62* 3.31* 3.90*  CALCIUM 8.4* 8.3* 8.3* 8.0* 8.2*  GFR: Estimated Creatinine Clearance: 25 mL/min (A) (by C-G formula based on SCr of 3.9 mg/dL (H)). Liver Function  Tests:  Recent Labs Lab 02/07/17 0053  AST 29  ALT 14*  ALKPHOS 95  BILITOT 2.4*  PROT 6.7  ALBUMIN 2.5*   No results for input(s): LIPASE, AMYLASE in the last 168 hours.  Recent Labs Lab 02/07/17 1416  AMMONIA 34   Coagulation Profile:  Recent Labs Lab 02/05/17 0550 02/06/17 0535 02/07/17 0053 02/08/17 0553 02/08/17 1958  INR 6.71* >10.00* 6.57* 4.13* 3.52   Cardiac Enzymes:  Recent Labs Lab 02/06/17 1806 02/09/17 0425  CKTOTAL 60 32*   BNP (last 3 results) No results for input(s): PROBNP in the last 8760 hours. HbA1C: No results for input(s): HGBA1C in the last 72 hours. CBG:  Recent Labs Lab 02/05/17 0750 02/06/17 0806 02/07/17 0743 02/08/17 0740 02/09/17 0808  GLUCAP 92 94 132* 111* 105*   Lipid Profile: No results for input(s): CHOL, HDL, LDLCALC, TRIG, CHOLHDL, LDLDIRECT in the last 72 hours. Thyroid Function Tests: No results for input(s): TSH, T4TOTAL, FREET4, T3FREE, THYROIDAB in the last 72 hours. Anemia Panel: No results for input(s): VITAMINB12, FOLATE, FERRITIN, TIBC, IRON, RETICCTPCT in the last 72 hours. Sepsis Labs: No results for input(s): PROCALCITON, LATICACIDVEN in the last 168 hours.  Recent Results (from the past 240 hour(s))  Culture, blood (routine x 2)     Status: Abnormal   Collection Time: 02/04/17  8:30 AM  Result Value Ref Range Status   Specimen Description BLOOD LEFT FOREARM  Final   Special Requests   Final    BOTTLES DRAWN AEROBIC AND ANAEROBIC Blood Culture adequate volume   Culture  Setup Time   Final    ANAEROBIC BOTTLE ONLY GRAM POSITIVE COCCI IN CLUSTERS CRITICAL RESULT CALLED TO, READ BACK BY AND VERIFIED WITH: TO JGRIMSLEY(PHARMd) BY TCLEVELAND 02/05/2017 AT 3:53AM Performed at [facility] Lab, [address], [place], [place] [address]    Culture METHICILLIN RESISTANT STAPHYLOCOCCUS AUREUS (A)  Final   Report Status 02/08/2017 FINAL  Final   Organism ID, Bacteria METHICILLIN RESISTANT STAPHYLOCOCCUS  AUREUS  Final      Susceptibility   Methicillin resistant staphylococcus aureus - MIC*    CIPROFLOXACIN >=8 RESISTANT Resistant     ERYTHROMYCIN 1 INTERMEDIATE Intermediate     GENTAMICIN <=0.5 SENSITIVE Sensitive     OXACILLIN >=4 RESISTANT Resistant     TETRACYCLINE <=1 SENSITIVE Sensitive     VANCOMYCIN <=0.5 SENSITIVE Sensitive     TRIMETH/SULFA <=10 SENSITIVE Sensitive     CLINDAMYCIN <=0.25 SENSITIVE Sensitive     RIFAMPIN <=0.5 SENSITIVE Sensitive     Inducible Clindamycin NEGATIVE Sensitive     * METHICILLIN RESISTANT STAPHYLOCOCCUS AUREUS  Blood Culture ID Panel (Reflexed)     Status: Abnormal   Collection Time: 02/04/17  8:30 AM  Result Value Ref Range Status   Enterococcus species NOT DETECTED NOT DETECTED Final   Listeria monocytogenes NOT DETECTED NOT DETECTED Final   Staphylococcus species DETECTED (A) NOT DETECTED Final    Comment: CRITICAL RESULT CALLED TO, READ BACK BY AND VERIFIED WITH: TO TO JGRIMSLEY(PHARMd0 BY TCLEVELAND 03/07/2017 AT 3:53AM    Staphylococcus aureus DETECTED (A) NOT DETECTED Final    Comment: Methicillin (oxacillin)-resistant Staphylococcus aureus (MRSA). MRSA is predictably resistant to beta-lactam antibiotics (except ceftaroline). Preferred therapy is vancomycin unless clinically contraindicated. Patient requires contact precautions if  hospitalized. CRITICAL RESULT CALLED TO, READ BACK BY AND VERIFIED WITH: TO TO JGRIMSLEY(PHARMd0 BY TCLEVELAND  03/07/2017 AT 3:53AM    Methicillin resistance DETECTED (A) NOT DETECTED Final    Comment: CRITICAL RESULT CALLED TO, READ BACK BY AND VERIFIED WITH: TO TO JGRIMSLEY(PHARMd0 BY TCLEVELAND 03/07/2017 AT 3:53AM    Streptococcus species NOT DETECTED NOT DETECTED Final   Streptococcus agalactiae NOT DETECTED NOT DETECTED Final   Streptococcus pneumoniae NOT DETECTED NOT DETECTED Final   Streptococcus pyogenes NOT DETECTED NOT DETECTED Final   Acinetobacter baumannii NOT DETECTED NOT DETECTED Final    Enterobacteriaceae species NOT DETECTED NOT DETECTED Final   Enterobacter cloacae complex NOT DETECTED NOT DETECTED Final   Escherichia coli NOT DETECTED NOT DETECTED Final   Klebsiella oxytoca NOT DETECTED NOT DETECTED Final   Klebsiella pneumoniae NOT DETECTED NOT DETECTED Final   Proteus species NOT DETECTED NOT DETECTED Final   Serratia marcescens NOT DETECTED NOT DETECTED Final   Haemophilus influenzae NOT DETECTED NOT DETECTED Final   Neisseria meningitidis NOT DETECTED NOT DETECTED Final   Pseudomonas aeruginosa NOT DETECTED NOT DETECTED Final   Candida albicans NOT DETECTED NOT DETECTED Final   Candida glabrata NOT DETECTED NOT DETECTED Final   Candida krusei NOT DETECTED NOT DETECTED Final   Candida parapsilosis NOT DETECTED NOT DETECTED Final   Candida tropicalis NOT DETECTED NOT DETECTED Final    Comment: Performed at [facility], [address], [place], [place] [address]  Culture, blood (routine x 2)     Status: None   Collection Time: 02/04/17  8:32 AM  Result Value Ref Range Status   Specimen Description BLOOD LEFT HAND  Final   Special Requests   Final    BOTTLES DRAWN AEROBIC AND ANAEROBIC Blood Culture adequate volume   Culture   Final    NO GROWTH 5 DAYS Performed at [facility], [address], [place], [place] [address]    Report Status 02/09/2017 FINAL  Final  Culture, blood (routine x 2)     Status: None (Preliminary result)   Collection Time: 02/06/17 11:03 AM  Result Value Ref Range Status   Specimen Description BLOOD LEFT HAND  Final   Special Requests   Final    BOTTLES DRAWN AEROBIC AND ANAEROBIC Blood Culture adequate volume   Culture   Final    NO GROWTH 3 DAYS Performed at [facility], [address], [place], [place] [address]    Report Status PENDING  Incomplete  Culture, blood (routine x 2)     Status: None (Preliminary result)   Collection Time: 02/06/17 11:04 AM  Result Value Ref Range Status   Specimen Description  BLOOD RIGHT FOREARM  Final   Special Requests   Final    BOTTLES DRAWN AEROBIC AND ANAEROBIC Blood Culture adequate volume   Culture   Final    NO GROWTH 3 DAYS Performed at [facility], [address], [place], [place] [address]    Report Status PENDING  Incomplete       Radiology Studies: No results found.    Scheduled Meds: . furosemide  40 mg Intravenous BID  . sodium chloride flush  3 mL Intravenous Q12H  . valACYclovir  1,000 mg Oral Daily  . Warfarin - Pharmacist Dosing Inpatient   Does not apply q1800   Continuous Infusions: . DAPTOmycin (CUBICIN)  IV       LOS: 5 days    Time spent: 40 minutes   Noralee Stain, DO [facility] [email or web address] Password [facility] 02/09/2017, 9:39 AM

## 2017-02-10 DIAGNOSIS — Z5181 Encounter for therapeutic drug level monitoring: Secondary | ICD-10-CM

## 2017-02-10 DIAGNOSIS — Z7901 Long term (current) use of anticoagulants: Secondary | ICD-10-CM

## 2017-02-10 DIAGNOSIS — K117 Disturbances of salivary secretion: Secondary | ICD-10-CM

## 2017-02-10 DIAGNOSIS — L89891 Pressure ulcer of other site, stage 1: Secondary | ICD-10-CM

## 2017-02-10 DIAGNOSIS — I480 Paroxysmal atrial fibrillation: Secondary | ICD-10-CM

## 2017-02-10 DIAGNOSIS — Z66 Do not resuscitate: Secondary | ICD-10-CM

## 2017-02-10 DIAGNOSIS — M10031 Idiopathic gout, right wrist: Secondary | ICD-10-CM

## 2017-02-10 MED ORDER — ACETAMINOPHEN 650 MG RE SUPP: 650.0000 mg | Freq: Four times a day (QID) | RECTAL | 0 refills | Status: AC | PRN

## 2017-02-10 MED ORDER — LORAZEPAM 2 MG/ML PO CONC: 0.5000 mg | ORAL | 0 refills | Status: AC | PRN

## 2017-02-10 MED ORDER — GLYCOPYRROLATE 1 MG/5ML PO SOLN: ORAL | 0 refills | Status: AC

## 2017-02-10 MED ORDER — MORPHINE SULFATE 10 MG/5ML PO SOLN: 2.5000 mg | ORAL | 0 refills | Status: AC | PRN

## 2017-02-10 MED ORDER — GLYCOPYRROLATE 0.2 MG/ML IJ SOLN
0.4000 mg | Freq: Four times a day (QID) | INTRAMUSCULAR | Status: DC
  Administered 2017-02-10: 0.4 mg via INTRAVENOUS
  Filled 2017-02-10 (×3): qty 2

## 2017-02-11 LAB — CULTURE, BLOOD (ROUTINE X 2)
CULTURE: NO GROWTH
CULTURE: NO GROWTH
SPECIAL REQUESTS: ADEQUATE
Special Requests: ADEQUATE

## 2017-02-11 LAB — HEPATITIS C ANTIBODY (REFLEX): HCV Ab: 0.2 s/co ratio (ref 0.0–0.9)

## 2017-02-11 LAB — HCV COMMENT:

## 2017-02-11 LAB — HEPATITIS B SURFACE ANTIGEN: Hepatitis B Surface Ag: NEGATIVE

## 2017-03-12 NOTE — Progress Notes (Addendum)
1030--[facility] Hospital Liaison RN Visit--[place] [phone]  Received request from CSW Myraette McGibboney for family interest in [facility] with request to transfer today. Chart reviewed. Pt unresponsive at this time. Confirmed with brother, Gene, interest in [facility]. He is currently en route from [place] to sign paperwork. Registration paperwork to be completed when Gene arrives.  1130--Services explained to brother Gene. Registation paperwork completed. Family agreeable to transfer today. Dr. Kern Reap to assume care per family request as family prefers hospice MD for specialty end of life care.  Discharge summary faxed to [phone].   RN please call report to [phone].  [facility] called for transport at 1220 pm.  Thank you, Haynes Bast, RN [facility] Hospital Liaison [phone]  [facility] Liaisons are also on AMION.

## 2017-03-12 NOTE — Progress Notes (Signed)
Patient ID: [name], male   DOB: 08/10/1946, 71 y.o.   MRN: [id number]  This NP visited patient at the bedside as a follow up for palliative needs.  Patient is unresponsive and appears to be transitioning at EOL.  Knees are mottled and lips are gray.  He has audible throat secretions.( will add Rubinol IV)  Prognosis is hours to days.  Discussed with bedside nurse  Placed call to brother and left voice message, he is encouraged to call with questions and concerns.   Time in   0800        Time out    0820    Total time 20 minutes  Greater than 50% of the time was spent in counseling and coordination of care  Lorinda Creed NP  Palliative Medicine Team Team Phone # [phone] Pager [phone]

## 2017-03-12 NOTE — Progress Notes (Addendum)
Spoke with pt's younger brother [name] at bedside on 5/1 with MD concerning Hospice in 24 hrs if the pt is still here. [name] selected [facility] and stated that is where the pt would want to go because he(pt) help to build [employer] [facility]. Referral given today to [facility]. Spoke with [name] today he still agrees with [facility].

## 2017-03-12 NOTE — Care Management Note (Signed)
Case Management Note  Patient Details  Name: [name] MRN: [id number] Date of Birth: 09-20-1946  Subjective/Objective:  AKI, MARSA bacteremia, Herpes zoster, malnutrition                  Action/Plan:Pt discharged to [facility]   Expected Discharge Date:  02-26-17               Expected Discharge Plan:  Hospice Medical Facility  In-House Referral:     Discharge planning Services  CM Consult  Post Acute Care Choice:    Choice offered to:  Patient, Sibling  DME Arranged:    DME Agency:     HH Arranged:    HH Agency:     Status of Service:  Completed, signed off  If discussed at Long Length of Stay Meetings, dates discussed:    Additional CommentsGeni Bers, RN 02-26-2017, 12:46 PM

## 2017-03-12 NOTE — Discharge Summary (Signed)
Discharge Summary  [name] WUJ:[id number] DOB: 22-Jan-1946  PCP: [facility]  Admit date: 02/04/2017 Discharge date: 03-06-17  Time spent: >71mins, more than 50% time spent on coordination of care  Recommendations for Outpatient Follow-up:  1. Patient is discharged to residential hospice with full comfort measures   Discharge Diagnoses:  Active Hospital Problems   Diagnosis Date Noted  . Herpes zoster without complication   . MRSA bacteremia   . Skin ulcer of left foot with fat layer exposed (HCC)   . Dyspnea   . Pain, generalized   . Palliative care by specialist   . AKI (acute kidney injury) (HCC)   . DNR (do not resuscitate)   . Pressure injury of skin 02/05/2017  . Malnutrition of moderate degree 02/05/2017  . Gout flare 02/04/2017    Resolved Hospital Problems   Diagnosis Date Noted Date Resolved  No resolved problems to display.    Discharge Condition: stable  Diet recommendation: comfort feeds  Filed Weights   02/07/17 0600 02/08/17 0452 02/09/17 0447  Weight: 129.7 kg (285 lb 15 oz) 131.1 kg (289 lb 0.4 oz) 130.9 kg (288 lb 9.3 oz)    History of present illness:  Referring MD/NP/PA: PA Dansie   PCP: [facility]   Patient coming from: home  Chief Complaint: pain in the right hand   HPI: [name] is a 71 y.o. male with medical history significant for gout, chronic diastolic CHF, last 2-D echo in January 2018 showed ejection fraction 55% and grade 3 diastolic dysfunction, dyslipidemia, atrial fibrillation on anticoagulation with Coumadin. Patient presented to [facility] long with worsening pain and redness and swelling in the right hand and his wrist. He reports taking allopurinol as prescribed and has not had gout flareup in a while but over past 3 days he was trying to get up from the bed and he mostly uses a right hand and this time the pain was getting worse and he could not get  himself up. Patient reports no fevers. No reports of chills. He does have a rash over his right shoulder and chest but reports no pain over that area. He is not sure for how long he has had about a rash that has been there for at least a week or so. No reports of cough or night sweats. No reports of abdominal pain, nausea or vomiting.   ED Course: In ED, patient was hemodynamically stable. White blood cell count was 12, hemoglobin 10, creatinine 1.72. His creatinine in May 2017 was as high as 1.31. INR was 4.65. X-ray of the right wrist showed diffuse swelling but no evidence of fracture. X-ray of the left foot was done because patient has a small ulceration right below the third metatarsal which is not open, has no active drainage. X-ray did not show any acute findings. Patient was admitted for management of gout flare, possible shingles and foot infection.  Patient condition deteriorate, he developed renal failure in the setting of possible cardiorenal syndrome + nephrotoxic agents including vancomycin. He is felt to be poor HD candidate, cardiology and nephrology recommended palliative care , family/patient now decided on full comfort measures and residential hospice placement  Prognosis:   Hours - Days  Please see palliative care note and hospice note for detail.  Hospital Course:  Active Problems:   Gout flare   Pressure injury of skin   Malnutrition of moderate degree   Palliative care by specialist  AKI (acute kidney injury) (HCC)   DNR (do not resuscitate)   Herpes zoster without complication   MRSA bacteremia   Skin ulcer of left foot with fat layer exposed (HCC)   Dyspnea   Pain, generalized   Gout flare, right wrist and hand with likely overlying cellulitis - Resolved now on full comfort measures   MRSA bacteremia - Repeat blood cultures 4/28 negative thus far  - Possible source is left foot ulcer  - Treated with vanco then daptomycin  - ID consulted, input  appreciated now on full comfort measures  Shingles over right chest and right shoulder  - Airborne precaution, now lesions crusted - he received acyclovir then valtrex in setting of worsening kidney failure  now on full comfort measures   Acute on chronic diastolic CHF - 2-D echo in January 2018 showed ejection fraction of 55% with grade 3 diastolic dysfunction - Worsening edema, anasarca, SOB, now requiring [place] O2, BNP 1599  - Cardiology consulted, no further cardiac recommendations, recommending palliative care , now on full comfort measures  Acute hypoxemic respiratory failure - Combination of CHF and possibly aspiration now on full comfort measures  Paralytic ileus - Vomiting, AXR and CT abdomen obtained which showed ileus without obstruction now on full comfort measures   AKI on CKD stage 3 - Baseline creatinine 2017 was 1.3 - Renal US without hydronephrosis - Worsening in setting of various medications, heart failure, cirrhosis. Nephrology following, recommending palliative care , now on full comfort measures  Cirrhosis, likely alcoholic  - CT abd/pelvis revealed mild cirrhosis, ascites  - Contributing to his overall volume status and poor prognosis  -now on full comfort measures  Supratherapeutic INR - Vit K 10mg  on 4/28  - Continue to hold coumadin now on full comfort measures   Left foot ulcer - Appreciate wound consultation - May be source of MRSA bacteremia per ID now on full comfort measures   Asymptomatic pyuria - No now on full comfort measures dysuria per report    Dyslipidemia - Holding pravachol in setting of daptomycin use  now on full comfort measures   Atrial fibrillation - CHADS vasc score at least 2 - On anticoagulation with Coumadin; INR supratherapeutic so Coumadin will be held  - Currently regular rhythm  now on full comfort measures   Chronic venous stasis bilaterally in lower extremities - Appreciate wound care  recommendations now on full comfort measures   Alcohol abuse - Brother reports 48oz alcohol daily - CIWA  now on full comfort measures  Goals of care - Palliative care consulted -on full comfort measures and family desires residential hospice placement    DVT prophylaxis: holding coumadin due to supratherapeutic INR Code Status: DNR  Family Communication: brother and sister-in-law  Disposition Plan: very poor prognosis, full comfort care    Consultants:   ID  Cardiology   Nephrology  Palliative care   Procedures:   None  Antimicrobials:             Anti-infectives     Start        Dose/Rate  Route  Frequency  Ordered  Stop    02/09/17 2200   DAPTOmycin (CUBICIN) 800 mg in sodium chloride 0.9 % IVPB  Status:  Discontinued      800 mg  232 mL/hr over 30 Minutes  Intravenous  Every 48 hours  02/08/17 1040  02/09/17 0759    02/09/17 2200   DAPTOmycin (CUBICIN) 800 mg in sodium chloride 0.9 %  IVPB      800 mg  232 mL/hr over 30 Minutes  Intravenous  Every 48 hours  02/09/17 0815       02/09/17 1400   DAPTOmycin (CUBICIN) 800 mg in sodium chloride 0.9 % IVPB  Status:  Discontinued      800 mg  232 mL/hr over 30 Minutes  Intravenous  Every 48 hours  02/09/17 0759  02/09/17 0815    02/09/17 1000   valACYclovir (VALTREX) tablet 1,000 mg      1,000 mg  Oral  Daily  02/08/17 1300       02/07/17 2000   valACYclovir (VALTREX) tablet 500 mg  Status:  Discontinued      500 mg  Oral  Daily  02/07/17 1714  02/08/17 1300    02/07/17 1000   acyclovir (ZOVIRAX) 985 mg in dextrose 5 % 150 mL IVPB  Status:  Discontinued      10 mg/kg  98.4 kg (Adjusted)  169.7 mL/hr over 60 Minutes  Intravenous  Every 12 hours  02/07/17 0813  02/07/17 1708    02/07/17 0015   piperacillin-tazobactam (ZOSYN) IVPB 3.375 g  Status:  Discontinued      3.375 g  12.5 mL/hr over 240 Minutes  Intravenous  Every 8 hours   02/07/17 0008  02/07/17 1655    02/06/17 2200   vancomycin (VANCOCIN) 1,250 mg in sodium chloride 0.9 % 250 mL IVPB  Status:  Discontinued      1,250 mg  166.7 mL/hr over 90 Minutes  Intravenous  Every 24 hours  02/06/17 0406  02/06/17 0413    02/06/17 2200   vancomycin (VANCOCIN) 1,500 mg in sodium chloride 0.9 % 500 mL IVPB  Status:  Discontinued      1,500 mg  250 mL/hr over 120 Minutes  Intravenous  Daily at bedtime  02/06/17 0413  02/06/17 1745    02/06/17 2200   DAPTOmycin (CUBICIN) 800 mg in sodium chloride 0.9 % IVPB  Status:  Discontinued      800 mg  232 mL/hr over 30 Minutes  Intravenous  Every 24 hours  02/06/17 1806  02/08/17 1040    02/06/17 0415   vancomycin (VANCOCIN) 1,500 mg in sodium chloride 0.9 % 500 mL IVPB      1,500 mg  250 mL/hr over 120 Minutes  Intravenous   Once  02/06/17 0406  02/06/17 0703    02/05/17 0615   acyclovir (ZOVIRAX) tablet 800 mg  Status:  Discontinued      800 mg  Oral  5 times daily  02/05/17 0613  02/07/17 0813    02/04/17 1400   clindamycin (CLEOCIN) IVPB 600 mg  Status:  Discontinued      600 mg  100 mL/hr over 30 Minutes  Intravenous  Every 8 hours  02/04/17 1027  02/06/17 0358    02/04/17 1045   acyclovir (ZOVIRAX) 200 MG capsule 800 mg  Status:  Discontinued      800 mg  Oral  5 times daily  02/04/17 1036  02/05/17 0614    02/04/17 0745   vancomycin (VANCOCIN) IVPB 1000 mg/200 mL premix      1,000 mg  200 mL/hr over 60 Minutes  Intravenous   Once  02/04/17 0736  02/04/17 1022    02/04/17 0745   piperacillin-tazobactam (ZOSYN) IVPB 3.375 g      3.375 g  100 mL/hr over 30 Minutes  Intravenous   Once  02/04/17 0736  02/04/17 0915  Discharge Exam: BP (!) 89/51 (BP Location: Right Arm)   Pulse (!) 102   Temp 97.8 F (36.6 C) (Oral)   Resp 20   Ht 6' (1.829 m)   Wt 130.9 kg (288 lb 9.3 oz)   SpO2 90%   BMI 39.14 kg/m   General: open  eyes to voice briefly ,not conversing, not follow commands, very frail, but does not look in distress Cardiovascular: tachycardic Respiratory: diminished Extremities: right wrist and hand gout has significantly improved in appearance, much less edema and no erythema  Skin: +crusting vesicular rash right chest, right shoulder, right upper back, +ulcer on plantar aspect left foot, +bilateral chronic venous stasis   Discharge Instructions You were cared for by a hospitalist during your hospital stay. If you have any questions about your discharge medications or the care you received while you were in the hospital after you are discharged, you can call the unit and asked to speak with the hospitalist on call if the hospitalist that took care of you is not available. Once you are discharged, your primary care physician will handle any further medical issues. Please note that NO REFILLS for any discharge medications will be authorized once you are discharged, as it is imperative that you return to your primary care physician (or establish a relationship with a primary care physician if you do not have one) for your aftercare needs so that they can reassess your need for medications and monitor your lab values.   Allergies as of 2017/02/14   No Known Allergies     Medication List    STOP taking these medications   acetaminophen 325 MG tablet Commonly known as:  TYLENOL Replaced by:  acetaminophen 650 MG suppository   allopurinol 100 MG tablet Commonly known as:  ZYLOPRIM   feeding supplement (ENSURE ENLIVE) Liqd   feeding supplement (PRO-STAT SUGAR FREE 64) Liqd   folic acid 1 MG tablet Commonly known as:  FOLVITE   furosemide 40 MG tablet Commonly known as:  LASIX   furosemide 80 MG tablet Commonly known as:  LASIX   guaiFENesin 600 MG 12 hr tablet Commonly known as:  MUCINEX   ipratropium-albuterol 0.5-2.5 (3) MG/3ML Soln Commonly known as:  DUONEB   metoprolol succinate 25 MG 24  hr tablet Commonly known as:  TOPROL-XL   multivitamin with minerals Tabs tablet   potassium chloride SA 20 MEQ tablet Commonly known as:  K-DUR,KLOR-CON   pravastatin 10 MG tablet Commonly known as:  PRAVACHOL   thiamine 100 MG tablet   warfarin 5 MG tablet Commonly known as:  COUMADIN     TAKE these medications   acetaminophen 650 MG suppository Commonly known as:  TYLENOL Place 1 suppository (650 mg total) rectally every 6 (six) hours as needed for mild pain (or Fever >/= 101). Replaces:  acetaminophen 325 MG tablet   Glycopyrrolate 1 MG/5ML Soln 0.4 mg sublingual q6hrs prn for oral secretion   LORazepam 2 MG/ML concentrated solution Commonly known as:  ATIVAN Take 0.3 mLs (0.6 mg total) by mouth every 4 (four) hours as needed for anxiety.   morphine 10 MG/5ML solution Take 1.3 mLs (2.6 mg total) by mouth every 2 (two) hours as needed for severe pain.      No Known Allergies Contact information for after-discharge care    Destination    [facility] .   Specialty:  Skilled Nursing Chief of Staff information: [address] [place] [address] [phone]  The results of significant diagnostics from this hospitalization (including imaging, microbiology, ancillary and laboratory) are listed below for reference.    Significant Diagnostic Studies: Ct Abdomen Pelvis Wo Contrast  Result Date: 02/07/2017 CLINICAL DATA:  Evaluate for ileus. Nausea and vomiting. History of alcoholism, sepsis. EXAM: CT ABDOMEN AND PELVIS WITHOUT CONTRAST TECHNIQUE: Multidetector CT imaging of the abdomen and pelvis was performed following the standard protocol without IV contrast. COMPARISON:  Abdominal radiograph February 06, 2017 and CT pelvis November 25, 2016 FINDINGS: LOWER CHEST: Small RIGHT and moderate LEFT pleural effusions. RIGHT middle and RIGHT lower lobe consolidation with air bronchograms in centrilobular  ground-glass nodules. LEFT lower lobe versus pneumonia. The heart is moderately enlarged. Moderate coronary artery calcifications. No pericardial effusion. HEPATOBILIARY: Slightly nodular liver compatible with cirrhosis. Small gallstones without CT findings of acute cholecystitis. PANCREAS: Atrophic pancreas. SPLEEN: Normal. ADRENALS/URINARY TRACT: Kidneys are orthotopic, demonstrating normal size and morphology. No nephrolithiasis, hydronephrosis; limited assessment for renal masses on this nonenhanced examination. The unopacified ureters are normal in course and caliber. Urinary bladder is partially distended and unremarkable. Thickened adrenal glands most compatible with hyperplasia. STOMACH/BOWEL: Contrast distended stomach. Multiple loops of mildly prominent small and large bowel with air-fluid levels. VASCULAR/LYMPHATIC: Enlarged inferior vena cava compatible with RIGHT heart failure. Aortoiliac vessels are normal in course and caliber, mild calcific atherosclerosis REPRODUCTIVE: Normal. OTHER: Presacral fat stranding most compatible with edema, generalized mesenteric edema and small volume ascites. MUSCULOSKELETAL: Severe anasarca. Subacute LEFT posterior fifth through eleventh rib fractures. Gynecomastia. Severe RIGHT and moderate to severe LEFT hip osteoarthrosis. Old LEFT inferior pubic ramus fracture. Scoliosis. Multilevel severe degenerative change of the lumbar spine. IMPRESSION: Ileus, no bowel obstruction. Findings of severe RIGHT heart failure and mild cirrhosis resulting in severe anasarca, small volume ascites and mesenteric edema. Small RIGHT pleural effusion with RIGHT lung base pneumonia. Small to moderate LEFT pleural effusion with atelectasis versus pneumonia. Electronically Signed   By: Awilda Metro M.D.   On: 02/07/2017 01:37   Dg Wrist Complete Right  Result Date: 02/04/2017 CLINICAL DATA:  History of gout, now with soft tissue swelling about the wrist. No known injury. EXAM: RIGHT  WRIST - COMPLETE 3+ VIEW COMPARISON:  04/23/2013 FINDINGS: There is diffuse soft tissue swelling about the wrist. No associated fracture or dislocation. There is widening of the scapholunate articulation with associated loss of the ulnar carpal joint space. Moderate degenerative change involving the radiocarpal joint as well as the STT joints of the base of the thumb with joint space loss, subchondral sclerosis and osteophytosis. No definitive erosions. No evidence of chondrocalcinosis. Vascular calcifications. No definite displacement of the pronator quadratus fat pad. IMPRESSION: 1. Diffuse soft tissue swelling about the wrist without associated fracture or dislocation. 2. Degenerative change of the wrist and widening of the scapholunate articulation with associated loss of the ulnar carpal joint space, progressed compared to the 04/2013 examination. Further evaluation with wrist MRI could be performed as clinically indicated. Electronically Signed   By: Simonne Come M.D.   On: 02/04/2017 07:35   US Renal  Result Date: 02/06/2017 CLINICAL DATA:  Acute renal injury. EXAM: RENAL / URINARY TRACT ULTRASOUND COMPLETE COMPARISON:  None. FINDINGS: Right Kidney: Length: 10.6 cm. Echogenicity within normal limits. No mass or hydronephrosis visualized. Left Kidney: Length: 11.4 cm. Visualization is somewhat limited but no abnormalities are seen. Bladder: Not visualized. IMPRESSION: 1. The left kidney was poorly visualized but grossly unremarkable. The right kidney is normal in appearance. The bladder was not visualized. Ascites near  the right lobe of the liver is incidentally noted. Electronically Signed   By: Gerome Sam III M.D   On: 02/06/2017 15:54   Dg Abd Acute W/chest  Result Date: 02/06/2017 CLINICAL DATA:  Low oxygen saturation. Vomiting. X 1 day. Hx afib, cardiomyopathy, CHF, GERD, gout, htn, obesity, ventricular tachycardia EXAM: DG ABDOMEN ACUTE W/ 1V CHEST COMPARISON:  11/25/2016 FINDINGS: There are  gas-filled loops of small and large bowel without significant dilation. There are few air-fluid levels on the decubitus view. Findings suggest an adynamic ileus. There is no free air. The soft tissues are not well visualized due to the overlying bowel gas. Frontal chest radiograph demonstrates mild enlargement of cardiac silhouette. There is perihilar and medial lung base opacity, greater on the left. The right medial lung base opacity has increased from the prior study. Possible left pleural effusion. No evidence a right pleural effusion. No pneumothorax. Skeletal structures are diffusely demineralized. There are degenerative changes throughout the visualized spine with several vertebral compression fractures presumed old. IMPRESSION: 1. Gas-filled bowel without significant dilation, but with a few air-fluid levels on the decubitus view. This suggests an adynamic ileus. No convincing obstruction. No free air. 2. Perihilar and left greater than right lung base opacity. Findings may reflect mild pulmonary edema with lung base atelectasis. Pneumonia is possible. Electronically Signed   By: Amie Portland M.D.   On: 02/06/2017 22:06   Dg Foot Complete Left  Result Date: 02/04/2017 CLINICAL DATA:  Foot wound/ulcer. EXAM: LEFT FOOT - COMPLETE 3+ VIEW COMPARISON:  None. FINDINGS: Advanced hallux valgus with cross toe deformity. Lateral digits show hammertoe deformity. This limits visualization of the first and second digits in the lateral projection. No convincing erosion; no suspected osteomyelitis. No fracture or traumatic malalignment. Osteopenia and arterial calcification. IMPRESSION: 1. No acute finding. 2. 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 Lateral hammertoes. Electronically Signed   By: Marnee Spring M.D.   On: 02/04/2017 07:33    Microbiology: Recent Results (from the past 240 hour(s))  Culture, blood (routine x 2)     Status: Abnormal   Collection Time: 02/04/17  8:30 AM  Result Value  Ref Range Status   Specimen Description BLOOD LEFT FOREARM  Final   Special Requests   Final    BOTTLES DRAWN AEROBIC AND ANAEROBIC Blood Culture adequate volume   Culture  Setup Time   Final    ANAEROBIC BOTTLE ONLY GRAM POSITIVE COCCI IN CLUSTERS CRITICAL RESULT CALLED TO, READ BACK BY AND VERIFIED WITH: TO JGRIMSLEY(PHARMd) BY TCLEVELAND 02/05/2017 AT 3:53AM Performed at [facility], [address], [place], [place] [address]    Culture METHICILLIN RESISTANT STAPHYLOCOCCUS AUREUS (A)  Final   Report Status 02/08/2017 FINAL  Final   Organism ID, Bacteria METHICILLIN RESISTANT STAPHYLOCOCCUS AUREUS  Final      Susceptibility   Methicillin resistant staphylococcus aureus - MIC*    CIPROFLOXACIN >=8 RESISTANT Resistant     ERYTHROMYCIN 1 INTERMEDIATE Intermediate     GENTAMICIN <=0.5 SENSITIVE Sensitive     OXACILLIN >=4 RESISTANT Resistant     TETRACYCLINE <=1 SENSITIVE Sensitive     VANCOMYCIN <=0.5 SENSITIVE Sensitive     TRIMETH/SULFA <=10 SENSITIVE Sensitive     CLINDAMYCIN <=0.25 SENSITIVE Sensitive     RIFAMPIN <=0.5 SENSITIVE Sensitive     Inducible Clindamycin NEGATIVE Sensitive     * METHICILLIN RESISTANT STAPHYLOCOCCUS AUREUS  Blood Culture ID Panel (Reflexed)     Status: Abnormal   Collection Time: 02/04/17  8:30 AM  Result Value Ref Range Status   Enterococcus species NOT DETECTED NOT DETECTED Final   Listeria monocytogenes NOT DETECTED NOT DETECTED Final   Staphylococcus species DETECTED (A) NOT DETECTED Final    Comment: CRITICAL RESULT CALLED TO, READ BACK BY AND VERIFIED WITH: TO TO JGRIMSLEY(PHARMd0 BY TCLEVELAND 03/07/2017 AT 3:53AM    Staphylococcus aureus DETECTED (A) NOT DETECTED Final    Comment: Methicillin (oxacillin)-resistant Staphylococcus aureus (MRSA). MRSA is predictably resistant to beta-lactam antibiotics (except ceftaroline). Preferred therapy is vancomycin unless clinically contraindicated. Patient requires contact precautions if    hospitalized. CRITICAL RESULT CALLED TO, READ BACK BY AND VERIFIED WITH: TO TO JGRIMSLEY(PHARMd0 BY [facility] 03/07/2017 AT 3:53AM    Methicillin resistance DETECTED (A) NOT DETECTED Final    Comment: CRITICAL RESULT CALLED TO, READ BACK BY AND VERIFIED WITH: TO TO JGRIMSLEY(PHARMd0 BY TCLEVELAND 03/07/2017 AT 3:53AM    Streptococcus species NOT DETECTED NOT DETECTED Final   Streptococcus agalactiae NOT DETECTED NOT DETECTED Final   Streptococcus pneumoniae NOT DETECTED NOT DETECTED Final   Streptococcus pyogenes NOT DETECTED NOT DETECTED Final   Acinetobacter baumannii NOT DETECTED NOT DETECTED Final   Enterobacteriaceae species NOT DETECTED NOT DETECTED Final   Enterobacter cloacae complex NOT DETECTED NOT DETECTED Final   Escherichia coli NOT DETECTED NOT DETECTED Final   Klebsiella oxytoca NOT DETECTED NOT DETECTED Final   Klebsiella pneumoniae NOT DETECTED NOT DETECTED Final   Proteus species NOT DETECTED NOT DETECTED Final   Serratia marcescens NOT DETECTED NOT DETECTED Final   Haemophilus influenzae NOT DETECTED NOT DETECTED Final   Neisseria meningitidis NOT DETECTED NOT DETECTED Final   Pseudomonas aeruginosa NOT DETECTED NOT DETECTED Final   Candida albicans NOT DETECTED NOT DETECTED Final   Candida glabrata NOT DETECTED NOT DETECTED Final   Candida krusei NOT DETECTED NOT DETECTED Final   Candida parapsilosis NOT DETECTED NOT DETECTED Final   Candida tropicalis NOT DETECTED NOT DETECTED Final    Comment: Performed at [facility], [address], [place], [place] [address]  Culture, blood (routine x 2)     Status: None   Collection Time: 02/04/17  8:32 AM  Result Value Ref Range Status   Specimen Description BLOOD LEFT HAND  Final   Special Requests   Final    BOTTLES DRAWN AEROBIC AND ANAEROBIC Blood Culture adequate volume   Culture   Final    NO GROWTH 5 DAYS Performed at [facility], [address], [place], [place] [address]    Report Status  02/09/2017 FINAL  Final  Culture, blood (routine x 2)     Status: None (Preliminary result)   Collection Time: 02/06/17 11:03 AM  Result Value Ref Range Status   Specimen Description BLOOD LEFT HAND  Final   Special Requests   Final    BOTTLES DRAWN AEROBIC AND ANAEROBIC Blood Culture adequate volume   Culture   Final    NO GROWTH 4 DAYS Performed at [facility], [address] [place], [place] [address]    Report Status PENDING  Incomplete  Culture, blood (routine x 2)     Status: None (Preliminary result)   Collection Time: 02/06/17 11:04 AM  Result Value Ref Range Status   Specimen Description BLOOD RIGHT FOREARM  Final   Special Requests   Final    BOTTLES DRAWN AEROBIC AND ANAEROBIC Blood Culture adequate volume   Culture   Final    NO GROWTH 4 DAYS Performed at [facility], [address], [place], [place] [address]    Report Status PENDING  Incomplete     Labs: Basic Metabolic Panel:  Recent Labs Lab 02/05/17 0550 02/06/17 0535 02/07/17 0053 02/08/17 0553 02/09/17 0425  NA 142 141 141 141 142  K 3.6 3.8 4.1 4.3 4.6  CL 106 106 105 106 107  CO2 25 24 24 24 25   GLUCOSE 91 103* 118* 116* 109*  BUN 63* 66* 77* 102* 88*  CREATININE 1.94* 2.20* 2.62* 3.31* 3.90*  CALCIUM 8.4* 8.3* 8.3* 8.0* 8.2*   Liver Function Tests:  Recent Labs Lab 02/07/17 0053  AST 29  ALT 14*  ALKPHOS 95  BILITOT 2.4*  PROT 6.7  ALBUMIN 2.5*   No results for input(s): LIPASE, AMYLASE in the last 168 hours.  Recent Labs Lab 02/07/17 1416  AMMONIA 34   CBC:  Recent Labs Lab 02/04/17 0832 02/05/17 0550 02/06/17 0535 02/07/17 0053 02/08/17 0553 02/09/17 0425  WBC 12.0* 11.2* 12.6* 15.3* 19.0* 20.4*  NEUTROABS 10.6*  --  11.2* 14.2* 17.7* 18.8*  HGB 10.0* 10.3* 10.7* 10.1* 8.8* 8.4*  HCT 30.2* 30.9* 32.0* 31.7* 27.7* 25.5*  MCV 102.0* 101.0* 100.9* 103.6* 104.5* 102.4*  PLT 209 228 242 260 240 250   Cardiac Enzymes:  Recent Labs Lab 02/06/17 1806  02/09/17 0425  CKTOTAL 60 32*   BNP: BNP (last 3 results)  Recent Labs  10/29/16 2336 02/07/17 0053  BNP 594.5* 1,599.6*    ProBNP (last 3 results) No results for input(s): PROBNP in the last 8760 hours.  CBG:  Recent Labs Lab 02/05/17 0750 02/06/17 0806 02/07/17 0743 02/08/17 0740 02/09/17 0808  GLUCAP 92 94 132* 111* 105*       Signed:  Festus Pursel MD, PhD  [facility] 17-Feb-2017, 12:08 PM

## 2017-03-12 NOTE — Progress Notes (Signed)
Chaplain responded to consult placed on the chart for Advanced Directive.  Per patient's brother, they already have this in place.  Brother also states that he is passing soon and is at this time unresponsive.    Brother appreciative of all of the care they have received on [facility] and is appreciative of Chaplain support. Chaplain also met [name].    Chaplain shared with the brother that it has been our pleasure to care for his brother during his stay with [place] and wish them all the best care as they head toward [place] and continue in this process    2017-03-09 1143  Clinical Encounter Type  Visited With Family  Visit Type Initial;Spiritual support;Social support;Patient actively dying  Stress Factors  Patient Stress Factors Health changes

## 2017-03-12 DEATH — deceased
